# Patient Record
Sex: Female | Born: 1935
Health system: Southern US, Community
[De-identification: ages and names within clinical notes are randomized; demographics above are authoritative.]

## PROBLEM LIST (undated history)

## (undated) DIAGNOSIS — I10 Essential (primary) hypertension: Secondary | ICD-10-CM

## (undated) DIAGNOSIS — I4891 Unspecified atrial fibrillation: Secondary | ICD-10-CM

## (undated) DIAGNOSIS — Z780 Asymptomatic menopausal state: Secondary | ICD-10-CM

## (undated) DIAGNOSIS — E785 Hyperlipidemia, unspecified: Secondary | ICD-10-CM

## (undated) HISTORY — DX: Essential (primary) hypertension: I10

## (undated) HISTORY — DX: Asymptomatic menopausal state: Z78.0

## (undated) HISTORY — PX: EYE SURGERY: SHX253

## (undated) HISTORY — PX: ABDOMINAL HYSTERECTOMY: SHX81

## (undated) HISTORY — DX: Unspecified atrial fibrillation: I48.91

## (undated) HISTORY — DX: Hyperlipidemia, unspecified: E78.5

---

## 1973-11-28 HISTORY — PX: BREAST LUMPECTOMY: SHX2

## 1998-04-08 ENCOUNTER — Other Ambulatory Visit: Admission: RE | Admit: 1998-04-08 | Discharge: 1998-04-08 | Payer: Self-pay | Admitting: Obstetrics and Gynecology

## 1999-06-08 ENCOUNTER — Ambulatory Visit (HOSPITAL_COMMUNITY): Admission: RE | Admit: 1999-06-08 | Discharge: 1999-06-08 | Payer: Self-pay | Admitting: Gastroenterology

## 2001-05-10 ENCOUNTER — Other Ambulatory Visit: Admission: RE | Admit: 2001-05-10 | Discharge: 2001-05-10 | Payer: Self-pay | Admitting: Obstetrics and Gynecology

## 2001-05-10 ENCOUNTER — Encounter (INDEPENDENT_AMBULATORY_CARE_PROVIDER_SITE_OTHER): Payer: Self-pay | Admitting: Specialist

## 2003-10-28 ENCOUNTER — Other Ambulatory Visit: Admission: RE | Admit: 2003-10-28 | Discharge: 2003-10-28 | Payer: Self-pay | Admitting: Family Medicine

## 2004-10-13 ENCOUNTER — Ambulatory Visit: Payer: Self-pay | Admitting: Family Medicine

## 2005-02-01 ENCOUNTER — Encounter: Admission: RE | Admit: 2005-02-01 | Discharge: 2005-02-01 | Payer: Self-pay | Admitting: Family Medicine

## 2005-02-01 ENCOUNTER — Ambulatory Visit: Payer: Self-pay | Admitting: Family Medicine

## 2005-02-01 ENCOUNTER — Other Ambulatory Visit: Admission: RE | Admit: 2005-02-01 | Discharge: 2005-02-01 | Payer: Self-pay | Admitting: Family Medicine

## 2005-10-07 ENCOUNTER — Ambulatory Visit: Payer: Self-pay | Admitting: Family Medicine

## 2006-04-04 ENCOUNTER — Ambulatory Visit: Payer: Self-pay | Admitting: Family Medicine

## 2006-11-13 ENCOUNTER — Ambulatory Visit: Payer: Self-pay | Admitting: Family Medicine

## 2006-11-14 ENCOUNTER — Encounter: Admission: RE | Admit: 2006-11-14 | Discharge: 2006-11-14 | Payer: Self-pay | Admitting: Family Medicine

## 2007-04-05 ENCOUNTER — Ambulatory Visit: Payer: Self-pay | Admitting: Family Medicine

## 2007-04-25 ENCOUNTER — Ambulatory Visit: Payer: Self-pay | Admitting: Family Medicine

## 2007-04-25 LAB — CONVERTED CEMR LAB
ALT: 17 units/L (ref 0–40)
Albumin: 3.3 g/dL — ABNORMAL LOW (ref 3.5–5.2)
Bilirubin, Direct: 0.1 mg/dL (ref 0.0–0.3)
Calcium: 9.2 mg/dL (ref 8.4–10.5)
Potassium: 3.8 meq/L (ref 3.5–5.1)
Sodium: 138 meq/L (ref 135–145)
Total Bilirubin: 0.7 mg/dL (ref 0.3–1.2)
Total Protein: 7.4 g/dL (ref 6.0–8.3)

## 2007-04-27 DIAGNOSIS — M810 Age-related osteoporosis without current pathological fracture: Secondary | ICD-10-CM | POA: Insufficient documentation

## 2007-04-27 DIAGNOSIS — H409 Unspecified glaucoma: Secondary | ICD-10-CM | POA: Insufficient documentation

## 2007-04-27 DIAGNOSIS — I10 Essential (primary) hypertension: Secondary | ICD-10-CM | POA: Insufficient documentation

## 2007-05-03 ENCOUNTER — Encounter: Payer: Self-pay | Admitting: Family Medicine

## 2007-06-29 ENCOUNTER — Ambulatory Visit: Payer: Self-pay | Admitting: Family Medicine

## 2007-06-29 DIAGNOSIS — E8941 Symptomatic postprocedural ovarian failure: Secondary | ICD-10-CM | POA: Insufficient documentation

## 2007-06-29 DIAGNOSIS — Z9189 Other specified personal risk factors, not elsewhere classified: Secondary | ICD-10-CM | POA: Insufficient documentation

## 2007-06-29 DIAGNOSIS — E785 Hyperlipidemia, unspecified: Secondary | ICD-10-CM | POA: Insufficient documentation

## 2007-06-29 DIAGNOSIS — M79609 Pain in unspecified limb: Secondary | ICD-10-CM | POA: Insufficient documentation

## 2007-07-05 ENCOUNTER — Encounter: Payer: Self-pay | Admitting: Family Medicine

## 2007-07-16 ENCOUNTER — Encounter (INDEPENDENT_AMBULATORY_CARE_PROVIDER_SITE_OTHER): Payer: Self-pay | Admitting: *Deleted

## 2007-08-15 ENCOUNTER — Ambulatory Visit: Payer: Self-pay | Admitting: Family Medicine

## 2007-08-16 ENCOUNTER — Encounter (INDEPENDENT_AMBULATORY_CARE_PROVIDER_SITE_OTHER): Payer: Self-pay | Admitting: *Deleted

## 2007-09-10 ENCOUNTER — Telehealth (INDEPENDENT_AMBULATORY_CARE_PROVIDER_SITE_OTHER): Payer: Self-pay | Admitting: *Deleted

## 2007-10-10 ENCOUNTER — Ambulatory Visit: Payer: Self-pay | Admitting: Family Medicine

## 2007-11-09 ENCOUNTER — Telehealth (INDEPENDENT_AMBULATORY_CARE_PROVIDER_SITE_OTHER): Payer: Self-pay | Admitting: *Deleted

## 2008-01-14 ENCOUNTER — Telehealth (INDEPENDENT_AMBULATORY_CARE_PROVIDER_SITE_OTHER): Payer: Self-pay | Admitting: *Deleted

## 2008-05-20 ENCOUNTER — Telehealth (INDEPENDENT_AMBULATORY_CARE_PROVIDER_SITE_OTHER): Payer: Self-pay | Admitting: *Deleted

## 2008-07-08 ENCOUNTER — Encounter (INDEPENDENT_AMBULATORY_CARE_PROVIDER_SITE_OTHER): Payer: Self-pay | Admitting: *Deleted

## 2008-07-08 ENCOUNTER — Ambulatory Visit: Payer: Self-pay | Admitting: Family Medicine

## 2008-07-09 ENCOUNTER — Encounter (INDEPENDENT_AMBULATORY_CARE_PROVIDER_SITE_OTHER): Payer: Self-pay | Admitting: *Deleted

## 2008-07-22 ENCOUNTER — Encounter (INDEPENDENT_AMBULATORY_CARE_PROVIDER_SITE_OTHER): Payer: Self-pay | Admitting: *Deleted

## 2008-07-22 ENCOUNTER — Encounter: Payer: Self-pay | Admitting: Family Medicine

## 2008-08-05 ENCOUNTER — Telehealth (INDEPENDENT_AMBULATORY_CARE_PROVIDER_SITE_OTHER): Payer: Self-pay | Admitting: *Deleted

## 2008-08-20 ENCOUNTER — Ambulatory Visit: Payer: Self-pay | Admitting: Family Medicine

## 2009-02-09 ENCOUNTER — Telehealth (INDEPENDENT_AMBULATORY_CARE_PROVIDER_SITE_OTHER): Payer: Self-pay | Admitting: *Deleted

## 2009-03-25 ENCOUNTER — Ambulatory Visit: Payer: Self-pay | Admitting: Family Medicine

## 2009-03-25 LAB — CONVERTED CEMR LAB
ALT: 20 units/L (ref 0–35)
AST: 21 units/L (ref 0–37)
Albumin: 3.7 g/dL (ref 3.5–5.2)
Alkaline Phosphatase: 51 units/L (ref 39–117)
BUN: 17 mg/dL (ref 6–23)
Bilirubin, Direct: 0 mg/dL (ref 0.0–0.3)
CO2: 31 meq/L (ref 19–32)
Calcium: 9 mg/dL (ref 8.4–10.5)
Chloride: 104 meq/L (ref 96–112)
Cholesterol: 219 mg/dL — ABNORMAL HIGH (ref 0–200)
Creatinine, Ser: 0.7 mg/dL (ref 0.4–1.2)
Direct LDL: 113.3 mg/dL
GFR calc non Af Amer: 86.19 mL/min (ref >60)
Glucose, Bld: 83 mg/dL (ref 70–99)
HDL: 72.3 mg/dL (ref >39.00)
Potassium: 4.1 meq/L (ref 3.5–5.1)
Sodium: 142 meq/L (ref 135–145)
Total Bilirubin: 0.8 mg/dL (ref 0.3–1.2)
Total CHOL/HDL Ratio: 3
Total Protein: 8.1 g/dL (ref 6.0–8.3)
Triglycerides: 140 mg/dL (ref 0.0–149.0)
VLDL: 28 mg/dL (ref 0.0–40.0)

## 2009-03-26 ENCOUNTER — Encounter (INDEPENDENT_AMBULATORY_CARE_PROVIDER_SITE_OTHER): Payer: Self-pay | Admitting: *Deleted

## 2009-03-27 ENCOUNTER — Encounter (INDEPENDENT_AMBULATORY_CARE_PROVIDER_SITE_OTHER): Payer: Self-pay | Admitting: *Deleted

## 2009-04-02 ENCOUNTER — Telehealth (INDEPENDENT_AMBULATORY_CARE_PROVIDER_SITE_OTHER): Payer: Self-pay | Admitting: *Deleted

## 2009-04-03 ENCOUNTER — Ambulatory Visit: Payer: Self-pay | Admitting: Family Medicine

## 2009-04-03 DIAGNOSIS — B070 Plantar wart: Secondary | ICD-10-CM | POA: Insufficient documentation

## 2009-04-03 DIAGNOSIS — M25559 Pain in unspecified hip: Secondary | ICD-10-CM | POA: Insufficient documentation

## 2009-04-03 DIAGNOSIS — D239 Other benign neoplasm of skin, unspecified: Secondary | ICD-10-CM | POA: Insufficient documentation

## 2009-04-08 ENCOUNTER — Ambulatory Visit: Payer: Self-pay | Admitting: Family Medicine

## 2009-04-09 ENCOUNTER — Telehealth (INDEPENDENT_AMBULATORY_CARE_PROVIDER_SITE_OTHER): Payer: Self-pay | Admitting: *Deleted

## 2009-06-11 ENCOUNTER — Encounter: Payer: Self-pay | Admitting: Family Medicine

## 2009-09-24 ENCOUNTER — Ambulatory Visit: Payer: Self-pay | Admitting: Family Medicine

## 2010-02-15 ENCOUNTER — Encounter: Payer: Self-pay | Admitting: Family Medicine

## 2010-02-15 ENCOUNTER — Telehealth (INDEPENDENT_AMBULATORY_CARE_PROVIDER_SITE_OTHER): Payer: Self-pay | Admitting: *Deleted

## 2010-02-24 ENCOUNTER — Encounter: Payer: Self-pay | Admitting: Family Medicine

## 2010-04-15 ENCOUNTER — Encounter: Payer: Self-pay | Admitting: Family Medicine

## 2010-05-12 ENCOUNTER — Telehealth (INDEPENDENT_AMBULATORY_CARE_PROVIDER_SITE_OTHER): Payer: Self-pay | Admitting: *Deleted

## 2010-06-02 ENCOUNTER — Ambulatory Visit: Payer: Self-pay | Admitting: Family Medicine

## 2010-06-02 DIAGNOSIS — I8390 Asymptomatic varicose veins of unspecified lower extremity: Secondary | ICD-10-CM | POA: Insufficient documentation

## 2010-06-08 ENCOUNTER — Ambulatory Visit: Payer: Self-pay | Admitting: Vascular Surgery

## 2010-06-08 ENCOUNTER — Encounter: Payer: Self-pay | Admitting: Family Medicine

## 2010-06-08 ENCOUNTER — Ambulatory Visit: Admission: RE | Admit: 2010-06-08 | Discharge: 2010-06-08 | Payer: Self-pay | Admitting: Family Medicine

## 2010-06-28 ENCOUNTER — Telehealth: Payer: Self-pay | Admitting: Family Medicine

## 2010-06-29 ENCOUNTER — Ambulatory Visit: Payer: Self-pay | Admitting: Family Medicine

## 2010-06-29 DIAGNOSIS — R011 Cardiac murmur, unspecified: Secondary | ICD-10-CM | POA: Insufficient documentation

## 2010-06-30 ENCOUNTER — Encounter: Payer: Self-pay | Admitting: Family Medicine

## 2010-07-01 ENCOUNTER — Telehealth (INDEPENDENT_AMBULATORY_CARE_PROVIDER_SITE_OTHER): Payer: Self-pay | Admitting: *Deleted

## 2010-07-13 ENCOUNTER — Encounter (INDEPENDENT_AMBULATORY_CARE_PROVIDER_SITE_OTHER): Payer: Self-pay | Admitting: *Deleted

## 2010-07-13 ENCOUNTER — Ambulatory Visit: Payer: Self-pay | Admitting: Family Medicine

## 2010-07-14 LAB — CONVERTED CEMR LAB: Fecal Occult Bld: NEGATIVE

## 2010-07-15 ENCOUNTER — Encounter: Payer: Self-pay | Admitting: Family Medicine

## 2010-07-15 ENCOUNTER — Ambulatory Visit: Payer: Self-pay | Admitting: Internal Medicine

## 2010-07-15 ENCOUNTER — Ambulatory Visit: Payer: Self-pay

## 2010-07-15 ENCOUNTER — Ambulatory Visit (HOSPITAL_COMMUNITY): Admission: RE | Admit: 2010-07-15 | Discharge: 2010-07-15 | Payer: Self-pay | Admitting: Family Medicine

## 2010-07-19 ENCOUNTER — Ambulatory Visit: Payer: Self-pay | Admitting: Family Medicine

## 2010-08-25 ENCOUNTER — Ambulatory Visit: Payer: Self-pay | Admitting: Family Medicine

## 2010-09-13 ENCOUNTER — Telehealth: Payer: Self-pay | Admitting: Family Medicine

## 2010-11-15 ENCOUNTER — Encounter: Payer: Self-pay | Admitting: Family Medicine

## 2010-12-26 LAB — CONVERTED CEMR LAB
ALT: 16 units/L (ref 0–35)
AST: 22 units/L (ref 0–37)
Alkaline Phosphatase: 47 units/L (ref 39–117)
BUN: 14 mg/dL (ref 6–23)
Basophils Absolute: 0.1 10*3/uL (ref 0.0–0.1)
Basophils Absolute: 0.1 10*3/uL (ref 0.0–0.1)
Basophils Relative: 0.9 % (ref 0.0–3.0)
Basophils Relative: 0.9 % (ref 0.0–3.0)
Bilirubin Urine: NEGATIVE
Bilirubin, Direct: 0.1 mg/dL (ref 0.0–0.3)
Bilirubin, Direct: 0.2 mg/dL (ref 0.0–0.3)
Blood in Urine, dipstick: NEGATIVE
Blood in Urine, dipstick: NEGATIVE
CO2: 29 meq/L (ref 19–32)
Calcium: 9.4 mg/dL (ref 8.4–10.5)
Chloride: 102 meq/L (ref 96–112)
Cholesterol: 203 mg/dL — ABNORMAL HIGH (ref 0–200)
Creatinine, Ser: 0.7 mg/dL (ref 0.4–1.2)
Direct LDL: 106.2 mg/dL
Direct LDL: 110.1 mg/dL
Eosinophils Absolute: 0.1 10*3/uL (ref 0.0–0.7)
Eosinophils Absolute: 0.1 10*3/uL (ref 0.0–0.7)
Eosinophils Relative: 2.2 % (ref 0.0–5.0)
GFR calc Af Amer: 90 mL/min
GFR calc non Af Amer: 74 mL/min
Glucose, Bld: 83 mg/dL (ref 70–99)
Glucose, Urine, Semiquant: NEGATIVE
Glucose, Urine, Semiquant: NEGATIVE
HCT: 40.4 % (ref 36.0–46.0)
Ketones, urine, test strip: NEGATIVE
Ketones, urine, test strip: NEGATIVE
Lymphs Abs: 3.5 10*3/uL (ref 0.7–4.0)
MCV: 95.2 fL (ref 78.0–100.0)
Monocytes Absolute: 0.8 10*3/uL (ref 0.1–1.0)
Monocytes Relative: 12.5 % — ABNORMAL HIGH (ref 3.0–12.0)
Neutro Abs: 1.7 10*3/uL (ref 1.4–7.7)
Neutrophils Relative %: 27.6 % — ABNORMAL LOW (ref 43.0–77.0)
Neutrophils Relative %: 44.4 % (ref 43.0–77.0)
Nitrite: NEGATIVE
Nitrite: NEGATIVE
Potassium: 3.4 meq/L — ABNORMAL LOW (ref 3.5–5.1)
Potassium: 4.1 meq/L (ref 3.5–5.1)
Protein, U semiquant: NEGATIVE
Protein, U semiquant: NEGATIVE
RBC: 4.24 M/uL (ref 3.87–5.11)
Sodium: 138 meq/L (ref 135–145)
Sodium: 140 meq/L (ref 135–145)
Specific Gravity, Urine: 1.01
Total Bilirubin: 0.6 mg/dL (ref 0.3–1.2)
Total Bilirubin: 0.8 mg/dL (ref 0.3–1.2)
Total CHOL/HDL Ratio: 2.7
Total CHOL/HDL Ratio: 3
Triglycerides: 131 mg/dL (ref 0.0–149.0)
VLDL: 26 mg/dL (ref 0–40)
WBC: 5.9 10*3/uL (ref 4.5–10.5)

## 2010-12-28 NOTE — Assessment & Plan Note (Signed)
Summary: twisted ankle/cbs   Vital Signs:  Patient profile:   77 year old female Height:      62.25 inches Weight:      128 pounds BMI:     23.31 Temp:     98.2 degrees F oral Pulse rate:   80 / minute BP sitting:   130 / 78  (left arm)  Vitals Entered By: Jeremy Johann CMA (June 02, 2010 11:06 AM) CC: pain in left leg Comments REVIEWED MED LIST, PATIENT AGREED DOSE AND INSTRUCTION CORRECT    History of Present Illness:  Injury      This is a 77 year old woman who presents with An injury.  The symptoms began 2 weeks ago.  Pt thinks she injured her L leg while [place] the [place].  The patient reports injury to the left thigh and left leg.  The patient also reports swelling and tenderness.  The patient denies redness, increased warmth deformity, blood loss, numbness, weakness, loss of sensation, coolness of extremity, and loss of consciousness.  Screening for risk of abuse was negative.    Current Medications (verified): 1)  Fosamax 70 Mg  Tabs (Alendronate Sodium) .Marland Kitchen.. 1 By Mouth Weekly 2)  Premarin 0.45 Mg  Tabs (Estrogens Conjugated) .... Take One Tablet Daily. 3)  Toprol Xl 50 Mg  Xr24h-Tab (Metoprolol Succinate) .Marland Kitchen.. 1 By Mouth Once Daily 4)  Red Yeast Rice 600 Mg  Caps (Red Yeast Rice Extract) .Marland Kitchen.. 1 Qd 5)  Mvi .Marland Kitchen.. 1 Once Daily 6)  Adult Aspirin Low Strength 81 Mg  Tbdp (Aspirin) .... 2 By Mouth Once Daily 7)  Fish Oil   Oil (Fish Oil) 8)  Calcium 600/vitamin D 600-200 Mg-Unit  Tabs (Calcium Carbonate-Vitamin D) .Marland Kitchen.. 1 By Mouth Three Times A Day 9)  Xalatan 0.005 %  Soln (Latanoprost) 10)  Tylenol Ex St Arthritis Pain 500 Mg Tabs (Acetaminophen) .... As Needed  Allergies (verified): No Known Drug Allergies  Past History:  Past medical, surgical, family and social histories (including risk factors) reviewed for relevance to current acute and chronic problems.  Past Medical History: Reviewed history from 06/29/2007 and no changes  required. Hypertension Osteoporosis Postmenopausal-HRT Hyperlipidemia  Past Surgical History: Reviewed history from 07/08/2008 and no changes required. Hysterectomy Lumpectomy (1975)  Family History: Reviewed history from 04/27/2007 and no changes required. Family History MI Family History Glaucoma  Social History: Reviewed history from 07/08/2008 and no changes required. 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   pt walking with crutches Skin:  Intact without suspicious lesions or rashes Psych:  Oriented X3 and normally interactive.     Impression & Recommendations:  Problem # 1:  LEG PAIN, LEFT (ICD-729.5)  Orders: T-Femur Left 2 views (73550TC) T-Knee Left 2 view (73560TC) Doppler Referral (Doppler)  Problem # 2:  VARICOSE VEINS, LOWER EXTREMITIES (ICD-454.9)  Orders: Doppler Referral (Doppler)  Complete Medication List: 1)  Fosamax 70 Mg Tabs (Alendronate sodium) .Marland Kitchen.. 1 by mouth weekly 2)  Premarin 0.45 Mg Tabs (Estrogens conjugated) .... Take one tablet daily. 3)  Toprol Xl 50 Mg Xr24h-tab (Metoprolol succinate) .Marland Kitchen.. 1 by mouth once daily 4)  Red Yeast Rice 600 Mg Caps (Red yeast rice extract) .Marland Kitchen.. 1 qd 5)  Mvi  .Marland Kitchen.. 1 once daily 6)  Adult Aspirin Low Strength 81 Mg Tbdp (Aspirin) .... 2 by mouth once daily 7)  Fish Oil Oil (Fish oil) 8)  Calcium 600/vitamin D 600-200 Mg-unit Tabs (Calcium carbonate-vitamin d) .Marland Kitchen.. 1 by mouth three times a day 9)  Xalatan 0.005 % Soln (Latanoprost) 10)  Tylenol Ex St Arthritis Pain 500 Mg Tabs (Acetaminophen) .... As needed  Patient  Instructions: 1)  rto as needed

## 2010-12-28 NOTE — Progress Notes (Signed)
Summary: Fosamax concerns  Phone Note Call from Patient   Caller: Patient Call For: Loreen Freud DO Details for Reason: Fosamax Summary of Call: Rcv'd mssg from pt requesting refill of Fosamax, said she has been on it for 5 years and wanted to know if it was ok to continue to take it or if it was time to change to something else. c/b # [id number]. Please advise Initial call taken by: Almeta Monas CMA Duncan Dull),  September 13, 2010 2:44 PM  Follow-up for Phone Call        time to change to something else---  looks like she is due for BMD--last one 06/2008?---- we will check BMD and then discuss options---con't fosamax until bmd done Follow-up by: Loreen Freud DO,  September 13, 2010 3:06 PM  Additional Follow-up for Phone Call Additional follow up Details #1::        FYI REFERENCE BMD...Marland KitchenMarland KitchenI SCHEDULED PATIENT FOR BMD 09-17-2010.  WHEN I CALLED TO INFORM HER SHE STATES SHE HAS ALREADY SPOKEN WITH [facility] & INFORMED THEM SHE WILL CALL THEM TO SCH'D HER MMG & BMD FOR MAY-2012, I CXLED ABOVE APPT & INFORMED DR. Sunnie Nielsen [facility]  September 14, 2010 3:09 PM      Appended Document: Fosamax concerns left detailed mssg to advised pt to continue RX and call with any questions or concerns.

## 2010-12-28 NOTE — Assessment & Plan Note (Signed)
Summary: flu shot//lch  Nurse Visit   Allergies: No Known Drug Allergies  Orders Added: 1)  Flu Vaccine 46yrs + MEDICARE PATIENTS [Q2039] 2)  Administration Flu vaccine - MCR [G0008] Flu Vaccine Consent Questions     Do you have a history of severe allergic reactions to this vaccine? no    Any prior history of allergic reactions to egg and/or gelatin? no    Do you have a sensitivity to the preservative Thimersol? no    Do you have a past history of Guillan-Barre Syndrome? no    Do you currently have an acute febrile illness? no    Have you ever had a severe reaction to latex? no    Vaccine information given and explained to patient? yes    Are you currently pregnant? no    Lot Number:[id number]   Exp Date:05/28/2011   Manufacturer: [employer]    Site Given Right Deltoid IM

## 2010-12-28 NOTE — Progress Notes (Signed)
Summary: Fasting  Phone Note Call from Patient Call back at Home Phone [phone]   Summary of Call: Patient left message asking if she should fast for CPX/labs tomorrow. Per Md--- since it is an afternoon appt she does not have to but if she can we will do labs at that time to save the patient a trip back to the office.   Left message on machine notifying patient. Initial call taken by: Lucious Groves CMA,  June 28, 2010 3:03 PM

## 2010-12-28 NOTE — Medication Information (Signed)
Summary: Letter Regarding Premarin/[facility]  Letter Regarding Premarin/[facility]   Imported By: Lanelle Bal 04/06/2010 12:43:32  _____________________________________________________________________  External Attachment:    Type:   Image     Comment:   External Document

## 2010-12-28 NOTE — Progress Notes (Signed)
Summary: Why Echo was Cancelled  Phone Note Outgoing Call Call back at Home Phone [phone]   Call placed by: Shonna Chock CMA,  July 01, 2010 4:35 PM Call placed to: Patient Summary of Call: why was appointment cancelled?  Signed by Loreen Freud DO on 06/30/2010 at 8:41 PM   Follow-up for Phone Call        I spoke with patient and she indicated that she cancled Echo appointment cause she could not make it that day but she did reschedule to  07/15/10  Follow-up by: Shonna Chock CMA,  July 01, 2010 4:37 PM

## 2010-12-28 NOTE — Consult Note (Signed)
Summary: Duard Larsen MD Dermatology  Duard Larsen MD Dermatology   Imported By: Lanelle Bal 04/13/2010 13:30:28  _____________________________________________________________________  External Attachment:    Type:   Image     Comment:   External Document

## 2010-12-28 NOTE — Miscellaneous (Signed)
Summary: Appointment Canceled  Appointment status changed to canceled by [employer] on 06/30/2010 4:41 PM.  Cancellation Comments --------------------- echo./[email or web address]/jml  Appointment Information ----------------------- Appt Type:  CARDIOLOGY ANCILLARY VISIT      Date:  Monday, July 12, 2010      Time:  3:00 PM for 60 min   Urgency:  Routine   Made By:  Pearson Grippe  To Visit:  [id number]    Reason:  echo./[email or web address]/jml  Appt Comments ------------- -- 06/30/10 16:41: (CEMR) CANCELED -- echo./murmur/jml -- 06/29/10 15:10: (CEMR) BOOKED -- Routine CARDIOLOGY ANCILLARY VISIT at 07/12/2010 3:00 PM for 60 min echo./murmur/jml

## 2010-12-28 NOTE — Assessment & Plan Note (Signed)
Summary: bp check//lch   Vital Signs:  Patient profile:   77 year old female BP sitting:   130 / 76  Vitals Entered By: Almeta Monas CMA Duncan Dull) (July 19, 2010 4:23 PM) CC: bp check only per pt   Allergies: No Known Drug Allergies   Complete Medication List: 1)  Fosamax 70 Mg Tabs (Alendronate sodium) .Marland Kitchen.. 1 by mouth weekly 2)  Premarin 0.45 Mg Tabs (Estrogens conjugated) .... Take one tablet daily. 3)  Toprol Xl 50 Mg Xr24h-tab (Metoprolol succinate) .Marland Kitchen.. 1 by mouth once daily 4)  Red Yeast Rice 600 Mg Caps (Red yeast rice extract) .Marland Kitchen.. 1 qd 5)  Mvi  .Marland Kitchen.. 1 once daily 6)  Adult Aspirin Low Strength 81 Mg Tbdp (Aspirin) .... 2 by mouth once daily 7)  Fish Oil Oil (Fish oil) 8)  Calcium 600/vitamin D 600-200 Mg-unit Tabs (Calcium carbonate-vitamin d) .Marland Kitchen.. 1 by mouth three times a day 9)  Xalatan 0.005 % Soln (Latanoprost) 10)  Tylenol Ex St Arthritis Pain 500 Mg Tabs (Acetaminophen) .... As needed 11)  Vitamin D3 2000 Unit Caps (Cholecalciferol) .Marland Kitchen.. 1 by mouth once daily

## 2010-12-28 NOTE — Miscellaneous (Signed)
Summary: Appointment Canceled  Appointment status changed to canceled by [employer] on 06/30/2010 4:42 PM.  Cancellation Comments --------------------- echo./murmur/jml  Appointment Information ----------------------- Appt Type:  CARDIOLOGY ANCILLARY VISIT      Date:  Wednesday, July 14, 2010      Time:  4:00 PM for 60 min   Urgency:  Routine   Made By:  Pearson Grippe  To Visit:  [id number]    Reason:  echo./murmur/jml  Appt Comments ------------- -- 06/30/10 16:42: (CEMR) CANCELED -- echo./murmur/jml -- 06/30/10 16:41: (CEMR) BOOKED -- Routine CARDIOLOGY ANCILLARY VISIT at 07/14/2010 4:00 PM for 60 min echo./murmur/jml  Appended Document: Appointment Canceled why was appointment cancelled?

## 2010-12-28 NOTE — Letter (Signed)
Summary: [facility]: Immunoassay Fecal Occult Blood (iFOB) Order Form  [facility] [place]  [address] [place], [place] [address]   Phone: [phone]  Fax: [phone]       Lab: Immunoassay Fecal Occult Blood (iFOB) Order Form   July 13, 2010 MRN: [id number]   Evelyn [name] 20-Aug-1930   Physicican Name:_______DR LOWNE__________________  Diagnosis Code:_____V76.51_____________________      Jeremy Johann CMA

## 2010-12-28 NOTE — Progress Notes (Signed)
Summary: referral  Phone Note Call from Patient Call back at Home Phone [phone]   Caller: Patient Summary of Call: pt left VM that she would like a referral to dermatologist to evaluated some spots on her face that she feel may be cancerous..............Marland KitchenFelecia Deloach CMA  February 15, 2010 5:22 PM   left pt detail message referral put in awaiting call back with appt info................Marland KitchenFelecia Deloach CMA  February 15, 2010 5:23 PM

## 2010-12-28 NOTE — Assessment & Plan Note (Signed)
Summary: CPX/KN   Vital Signs:  Patient profile:   77 year old female Height:      62.25 inches (158.12 cm) Weight:      126 pounds (57.27 kg) BMI:     22.94 Temp:     98.2 degrees F (36.78 degrees C) oral BP sitting:   160 / 74  (right arm) Cuff size:   regular  Vitals Entered By: Lucious Groves CMA (June 29, 2010 1:15 PM) CC: CPX./kb Is Patient Diabetic? No Pain Assessment Patient in pain? no       Does patient need assistance? Functional Status Self care, Cook/clean, Shopping, Social activities Ambulation Normal Comments Patient notes that she did not take her BP med due to fasting for labs today./kb Pt able to read and write and able to perform all ADLs.    Vision Screening:      Vision Comments: corrected to 20/20 with contact 40db HL: Left  Right  Audiometry Comment: hearing grossly normal     History of Present Illness: Pt here for cpe and labs.    Pt still c/o L knee pain---xrays --+ arthritic changes only  Hyperlipidemia follow-up      This is a 77 year old woman who presents for Hyperlipidemia follow-up.  The patient denies muscle aches, GI upset, abdominal pain, flushing, itching, constipation, diarrhea, and fatigue.  The patient denies the following symptoms: chest pain/pressure, exercise intolerance, dypsnea, palpitations, syncope, and pedal edema.  Compliance with medications (by patient report) has been near 100%.  Dietary compliance has been good.  The patient reports no exercise.    Hypertension follow-up      The patient also presents for Hypertension follow-up.  The patient denies lightheadedness, urinary frequency, headaches, edema, impotence, rash, and fatigue.  The patient denies the following associated symptoms: chest pain, chest pressure, exercise intolerance, dyspnea, palpitations, syncope, leg edema, and pedal edema.  Compliance with medications (by patient report) has been near 100%.  The patient reports that dietary compliance has been good.   The patient reports no exercise.  Adjunctive measures currently used by the patient include salt restriction.    Preventive Screening-Counseling & Management  Alcohol-Tobacco     Alcohol drinks/day: <1     Smoking Status: quit > 6 months     Packs/Day: 1.0     Year Started: 1947     Year Quit: 1975  Caffeine-Diet-Exercise     Caffeine use/day: 3     Does Patient Exercise: no     Times/week: 0  Hep-HIV-STD-Contraception     HIV Risk: no     Dental Visit-last 6 months yes     Dental Care Counseling: not indicated; dental care within six months     SBE monthly: yes     SBE Education/Counseling: not indicated; SBE done regularly  Safety-Violence-Falls     Seat Belt Use: 100      Sexual History:  widow.    Current Medications (verified): 1)  Fosamax 70 Mg  Tabs (Alendronate Sodium) .Marland Kitchen.. 1 By Mouth Weekly 2)  Premarin 0.45 Mg  Tabs (Estrogens Conjugated) .... Take One Tablet Daily. 3)  Toprol Xl 50 Mg  Xr24h-Tab (Metoprolol Succinate) .Marland Kitchen.. 1 By Mouth Once Daily 4)  Red Yeast Rice 600 Mg  Caps (Red Yeast Rice Extract) .Marland Kitchen.. 1 Qd 5)  Mvi .Marland Kitchen.. 1 Once Daily 6)  Adult Aspirin Low Strength 81 Mg  Tbdp (Aspirin) .... 2 By Mouth Once Daily 7)  Fish Oil   Oil (Fish  Oil) 8)  Calcium 600/vitamin D 600-200 Mg-Unit  Tabs (Calcium Carbonate-Vitamin D) .Marland Kitchen.. 1 By Mouth Three Times A Day 9)  Xalatan 0.005 %  Soln (Latanoprost) 10)  Tylenol Ex St Arthritis Pain 500 Mg Tabs (Acetaminophen) .... 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 Family History MI Family History Glaucoma Family History Lung cancer  Social History: Reviewed history from 07/08/2008 and no changes required. 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 Eyes:  Denies blurring, discharge, double vision, eye irritation, eye pain, halos, itching, light sensitivity, red eye, vision loss-1 eye, and vision loss-both eyes. ENT:  Denies decreased hearing, difficulty swallowing, ear discharge, earache, hoarseness, nasal congestion, nosebleeds, postnasal drainage, ringing in ears, sinus pressure, and sore throat. CV:  Denies bluish discoloration of lips or nails, chest pain or discomfort, difficulty breathing at night, difficulty breathing while lying down, fainting, fatigue, leg cramps with exertion, lightheadness, near fainting, palpitations, shortness of breath with exertion, swelling of feet, swelling of hands, and weight gain. Resp:  Denies chest discomfort, chest pain with inspiration, cough, coughing up blood, excessive snoring, hypersomnolence, morning headaches, pleuritic, shortness of breath, sputum productive, and wheezing. GI:  Denies abdominal pain, bloody stools, change in bowel habits, constipation, dark tarry stools, diarrhea, excessive appetite,  gas, hemorrhoids, indigestion, loss of appetite, and nausea. GU:  Denies abnormal vaginal bleeding, decreased libido, discharge, dysuria, genital sores, hematuria, incontinence, nocturia, urinary frequency, and urinary hesitancy. MS:  Complains of joint pain; denies joint redness, joint swelling, loss of strength, low back pain, mid back pain, muscle aches, muscle , cramps, muscle weakness, stiffness, and thoracic pain. Derm:  Denies changes in color of skin, changes in nail beds, dryness, excessive perspiration, flushing, hair loss, insect bite(s), itching, lesion(s), poor wound healing, and rash. Neuro:  Denies brief paralysis, difficulty with concentration, disturbances in coordination, falling down, headaches, inability to speak, memory loss, numbness, poor balance, seizures, sensation of room spinning, tingling, tremors, visual disturbances, and weakness. Psych:  Denies alternate hallucination ( auditory/visual), anxiety, depression, easily angered, easily tearful, irritability, mental problems, panic attacks, sense of great danger, suicidal thoughts/plans, thoughts of violence, unusual visions or sounds, and thoughts /plans of harming others. Endo:  Denies cold intolerance, excessive hunger, excessive thirst, excessive urination, heat intolerance, polyuria, and weight change. Heme:  Denies abnormal bruising, bleeding, enlarge lymph nodes, fevers, pallor, and skin discoloration. Allergy:  Denies hives or rash, itching eyes, persistent infections, seasonal allergies, and sneezing.  Physical Exam  General:  Well-developed,well-nourished,in no acute distress; alert,appropriate and cooperative throughout examination Head:  Normocephalic and atraumatic without obvious abnormalities. No apparent alopecia or balding. Eyes:  pupils equal, pupils round, pupils reactive to light, and no injection.   Ears:  External ear exam shows no significant lesions or deformities.  Otoscopic examination reveals clear  canals, tympanic membranes are intact bilaterally without bulging, retraction, inflammation or discharge. Hearing is grossly normal bilaterally. Nose:  External nasal examination shows no deformity or inflammation. Nasal mucosa are pink and moist without lesions or exudates. Mouth:  Oral mucosa and oropharynx without lesions or exudates.  Teeth in good repair. Neck:  No deformities, masses, or tenderness noted.no carotid bruits.   Chest Wall:  No deformities, masses, or tenderness noted. Breasts:  No mass, nodules, thickening, tenderness, bulging, retraction, inflamation, nipple discharge or skin changes noted.   Lungs:  Normal respiratory effort, chest expands symmetrically. Lungs are clear to auscultation, no crackles or wheezes. Heart:  normal rate and Grade 2  /6 systolic ejection murmur.   Abdomen:  Bowel sounds positive,abdomen soft and non-tender without masses, organomegaly or hernias noted. Msk:  normal ROM.   L knee-+ swelling , no pain with palpation Pulses:  R posterior tibial normal, R dorsalis pedis normal, R carotid normal, L posterior tibial normal, L dorsalis pedis normal, and L carotid normal.   Extremities:  No clubbing, cyanosis, edema, or deformity noted with normal full range of motion of all joints.   Neurologic:  alert & oriented X3, cranial nerves II-XII intact, strength normal in all extremities, and gait normal.   Skin:  Intact without suspicious lesions or rashes Cervical Nodes:  No lymphadenopathy noted Axillary Nodes:  No palpable lymphadenopathy Psych:  Cognition and judgment appear intact. Alert and cooperative with normal attention span and concentration. No apparent delusions, illusions, hallucinations   Impression & Recommendations:  Problem # 1:  PREVENTIVE HEALTH CARE (ICD-V70.0)  Orders: Venipuncture (57846) TLB-Lipid Panel (80061-LIPID) TLB-BMP (Basic Metabolic Panel-BMET) (80048-METABOL) TLB-CBC Platelet - w/Differential  (85025-CBCD) TLB-Hepatic/Liver Function Pnl (80076-HEPATIC) T-Vitamin D (25-Hydroxy) (96295-28413) Specimen Handling (24401) UA Dipstick w/o Micro (manual) (81002) EKG w/ Interpretation (93000) First annual wellness visit with prevention plan  (U2725)  Problem # 2:  CARDIAC MURMUR (ICD-785.2) Assessment: New  Orders: Echo Referral (Echo) EKG w/ Interpretation (93000) Prescription Created Electronically [phone])  Problem # 3:  HYPERLIPIDEMIA (ICD-272.4)  Orders: Venipuncture (03474) TLB-Lipid Panel (80061-LIPID) TLB-BMP (Basic Metabolic Panel-BMET) (80048-METABOL) TLB-CBC Platelet - w/Differential (85025-CBCD) TLB-Hepatic/Liver Function Pnl (80076-HEPATIC) T-Vitamin D (25-Hydroxy) (25956-38756) Specimen Handling (43329) EKG w/ Interpretation (93000) Prescription Created Electronically [id number])  Labs Reviewed: SGOT: 21 (03/25/2009)   SGPT: 20 (03/25/2009)  Prior 10 Yr Risk Heart Disease: Not enough information (06/29/2007)   HDL:72.30 (03/25/2009), 76.9 (07/08/2008)  LDL:DEL (07/08/2008)  Chol:219 (03/25/2009), 209 (07/08/2008)  Trig:140.0 (03/25/2009), 128 (07/08/2008)  Problem # 4:  OSTEOPOROSIS (ICD-733.00)  Her updated medication list for this problem includes:    Fosamax 70 Mg Tabs (Alendronate sodium) .Marland Kitchen... 1 by mouth weekly    Calcium 600/vitamin D 600-200 Mg-unit Tabs (Calcium carbonate-vitamin d) .Marland Kitchen... 1 by mouth three times a day  Orders: Venipuncture (16606) TLB-Lipid Panel (80061-LIPID) TLB-BMP (Basic Metabolic Panel-BMET) (80048-METABOL) TLB-CBC Platelet - w/Differential (85025-CBCD) TLB-Hepatic/Liver Function Pnl (80076-HEPATIC) T-Vitamin D (25-Hydroxy) (30160-10932) Specimen Handling (35573) EKG w/ Interpretation (93000) Prescription Created Electronically [id number])  Bone Density: abnormal (07/09/2008) Vit D:54 (03/25/2009), 60 (07/08/2008)  Problem # 5:  HYPERTENSION (ICD-401.9)  Her updated medication list for this problem includes:    Toprol Xl 50  Mg Xr24h-tab (Metoprolol succinate) .Marland Kitchen... 1 by mouth once daily  Orders: Venipuncture (16109) TLB-Lipid Panel (80061-LIPID) TLB-BMP (Basic Metabolic Panel-BMET) (80048-METABOL) TLB-CBC Platelet - w/Differential (85025-CBCD) TLB-Hepatic/Liver Function Pnl (80076-HEPATIC) T-Vitamin D (25-Hydroxy) (60454-09811) Echo Referral (Echo) EKG w/ Interpretation (93000) Prescription Created Electronically [id number])  BP today: 160/74 Prior BP: 130/78 (06/02/2010)  Prior 10 Yr Risk Heart Disease: Not enough information (06/29/2007)  Labs Reviewed: K+: 4.1 (03/25/2009) Creat: : 0.7 (03/25/2009)   Chol: 219 (03/25/2009)   HDL: 72.30 (03/25/2009)   LDL: DEL (07/08/2008)   TG: 140.0 (03/25/2009)  Complete Medication List: 1)  Fosamax 70 Mg Tabs (Alendronate sodium) .Marland Kitchen.. 1 by mouth weekly 2)  Premarin 0.45 Mg Tabs (Estrogens conjugated) .... Take one tablet daily. 3)  Toprol Xl 50 Mg Xr24h-tab (Metoprolol succinate) .Marland Kitchen.. 1 by mouth once daily 4)  Red Yeast Rice 600 Mg Caps (Red yeast rice extract) .Marland Kitchen.. 1 qd 5)  Mvi  .Marland Kitchen.. 1 once daily 6)  Adult Aspirin Low Strength 81 Mg Tbdp (Aspirin) .... 2 by mouth once daily 7)  Fish Oil Oil (Fish oil) 8)  Calcium 600/vitamin D 600-200 Mg-unit Tabs (Calcium carbonate-vitamin d) .Marland Kitchen.. 1 by mouth three times a day 9)  Xalatan 0.005 % Soln (Latanoprost) 10)  Tylenol Ex St Arthritis Pain 500 Mg Tabs (Acetaminophen) .... As needed Prescriptions: PREMARIN 0.45 MG  TABS (ESTROGENS CONJUGATED) Take one tablet daily.  #30 x 11   Entered and Authorized by:   Loreen Freud DO   Signed by:   Loreen Freud DO on 06/29/2010   Method used:   Electronically to        [facility] [phone]* (retail)       [address]       [place], [place]  [address]       Ph: [phone]       Fax: [phone]   RxID:   [id number]    PAP Next Due:  Refused Last Mammogram:  normal bilateral (07/08/2007 6:22:00 PM) Mammogram Result Date:   04/07/2010 Mammogram Result:  normal Mammogram Next Due:  1 yr Bone Density Result Date:  07/09/2008 Bone Density Result:  abnormal Bone Density Next Due: 2 yr       Laboratory Results   Urine Tests   Date/Time Reported: June 29, 2010 2:12 PM   Routine Urinalysis   Color: yellow Appearance: Clear Glucose: negative   (Normal Range: Negative) Bilirubin: negative   (Normal Range: Negative) Ketone: negative   (Normal Range: Negative) Spec. Gravity: 1.010   (Normal Range: 1.003-1.035) Blood: negative   (Normal Range: Negative) pH: 7.5   (Normal Range: 5.0-8.0) Protein: negative   (Normal Range: Negative) Urobilinogen: negative   (Normal Range: 0-1) Nitrite: negative   (Normal Range: Negative) Leukocyte Esterace: negative   (Normal Range: Negative)    Comments: Floydene Flock  June 29, 2010 2:12 PM

## 2010-12-28 NOTE — Progress Notes (Signed)
Summary: pt needs ov lm & mailed letter  Phone Note Outgoing Call   Call placed by: Army Fossa CMA,  May 12, 2010 1:09 PM Reason for Call: Confirm/change Appt Summary of Call: Pt needs to schedule an OV. Army Fossa CMA  May 12, 2010 1:09 PM   Follow-up for Phone Call        lm am to schedule appt .Marland KitchenOkey Regal [place]  May 13, 2010 12:11 PM  mailed leterr .Marland KitchenOkey Regal [place]  May 14, 2010 3:33 PM

## 2011-01-13 NOTE — Medication Information (Signed)
Summary: Letter Regarding Premarin/[facility]  Letter Regarding Premarin/[facility]   Imported By: Lanelle Bal 01/05/2011 08:25:05  _____________________________________________________________________  External Attachment:    Type:   Image     Comment:   External Document

## 2011-05-17 ENCOUNTER — Other Ambulatory Visit: Payer: Self-pay | Admitting: *Deleted

## 2011-05-17 MED ORDER — METOPROLOL SUCCINATE ER 50 MG PO TB24: 50.0000 mg | ORAL_TABLET | Freq: Every day | ORAL | Status: DC

## 2011-05-17 NOTE — Telephone Encounter (Signed)
Rx faxed.    KP 

## 2011-06-06 ENCOUNTER — Encounter: Payer: Self-pay | Admitting: Family Medicine

## 2011-06-09 ENCOUNTER — Encounter: Payer: Self-pay | Admitting: Family Medicine

## 2011-06-20 ENCOUNTER — Telehealth: Payer: Self-pay

## 2011-06-20 NOTE — Telephone Encounter (Signed)
Mssg from patient stating she has a pain in her neck for the last few days. No other details left.     I tried calling patient.   Lmom    KP

## 2011-06-21 ENCOUNTER — Encounter: Payer: Self-pay | Admitting: Family Medicine

## 2011-06-21 ENCOUNTER — Ambulatory Visit (INDEPENDENT_AMBULATORY_CARE_PROVIDER_SITE_OTHER): Payer: Medicare Other | Admitting: Family Medicine

## 2011-06-21 VITALS — BP 142/78 | HR 60 | Temp 99.2°F | Wt 128.6 lb

## 2011-06-21 DIAGNOSIS — M542 Cervicalgia: Secondary | ICD-10-CM

## 2011-06-21 DIAGNOSIS — N951 Menopausal and female climacteric states: Secondary | ICD-10-CM

## 2011-06-21 DIAGNOSIS — Z780 Asymptomatic menopausal state: Secondary | ICD-10-CM

## 2011-06-21 MED ORDER — TRAMADOL HCL 50 MG PO TABS: 50.0000 mg | ORAL_TABLET | Freq: Four times a day (QID) | ORAL | Status: AC | PRN

## 2011-06-21 MED ORDER — ESTROGENS CONJ SYNTHETIC A 0.3 MG PO TABS: 0.3000 mg | ORAL_TABLET | Freq: Every day | ORAL | Status: DC

## 2011-06-21 NOTE — Progress Notes (Signed)
  Subjective:     Evelyn [name] is a 77 y.o. female who presents for evaluation of neck pain. Event that precipitated these symptoms: none known. Onset of symptoms was 6 weeks ago, and have been unchanged since that time. Current symptoms are pain in R side neck to scalp (dull in character; 2/10 in severity). Patient denies numbness in arms and weakness in arms. Patient has had no prior neck problems and hx OA and Osteoporosis and sinus headaches.. Previous treatments: medication: analgesic: sudafed, tylenol.  The following portions of the patient's history were reviewed and updated as appropriate: allergies, current medications, past family history, past medical history, past social history, past surgical history and problem list.  Review of Systems Pertinent items are noted in HPI.    Objective:    BP 142/78  Pulse 60  Temp(Src) 99.2 F (37.3 C) (Oral)  Wt 128 lb 9.6 oz (58.333 kg)  SpO2 95% General:   alert, cooperative, appears stated age and no distress  External Deformity:  absent  ROM Cervical Spine:  supple and left rotation to 45 degrees  Midline Tenderness:  absent midline  Paraspinous tenderness:  + tenderness OA / AA on Right  UE Neurologic Exam HEENT:  Unremarkable  No sinus tenderness,  turb errythematous    X-ray of the cervical spine: Not indicated    Assessment:    Cervical pain   sinus pressure Plan:    ultram for pain Warm compresses otc antihistamine,  nasonex If no better we will get xray neck

## 2011-06-21 NOTE — Telephone Encounter (Signed)
Late entry: I spoke with patient and scheduled her an appt with Dr.Lowne because she had the pain for over a month and was not sure if it was sinuses or headache related. She was seen today

## 2011-06-22 ENCOUNTER — Telehealth: Payer: Self-pay | Admitting: *Deleted

## 2011-06-22 NOTE — Telephone Encounter (Signed)
PA complete and faxed back awaiting response.

## 2011-06-22 NOTE — Telephone Encounter (Signed)
Call received from [facility] that med was denied due to Pt not trying any of formulary alternative.

## 2011-06-22 NOTE — Telephone Encounter (Signed)
Preferred med: Gerlene Burdock, Estradiol, Jolivette, Orth-Est or prgestrone capsule. Pt must have tried and fail one of the following preferred med in order to receive coverage of a non-preferred med.

## 2011-06-22 NOTE — Telephone Encounter (Signed)
Those are all BCP---pt is postmenopausal---she needs HRT not bcp

## 2011-06-22 NOTE — Telephone Encounter (Signed)
de

## 2011-06-29 ENCOUNTER — Telehealth: Payer: Self-pay

## 2011-06-29 NOTE — Telephone Encounter (Signed)
Discussed benefits with patient and advised it will be a 15 dollar copay with OV and 165 is the 20 percent she will be responsible for. She voiced understanding and agreed to the injection, she stated she would call and schedule her appt in 2 weeks.     KP

## 2011-06-29 NOTE — Telephone Encounter (Signed)
Error.     KP 

## 2011-06-30 MED ORDER — ESTRADIOL 0.5 MG PO TABS: 0.5000 mg | ORAL_TABLET | Freq: Every day | ORAL | Status: DC

## 2011-06-30 NOTE — Telephone Encounter (Signed)
Discuss with patient by kim.

## 2011-06-30 NOTE — Telephone Encounter (Signed)
Estradiol 0.5 mg  #30  1 po qd ,   11 refills

## 2011-06-30 NOTE — Telephone Encounter (Signed)
Left message to call office, Rx sent to pharmacy, med changed.

## 2011-07-12 ENCOUNTER — Other Ambulatory Visit: Payer: Self-pay | Admitting: Family Medicine

## 2011-07-14 ENCOUNTER — Ambulatory Visit (INDEPENDENT_AMBULATORY_CARE_PROVIDER_SITE_OTHER): Payer: Medicare Other | Admitting: Family Medicine

## 2011-07-14 ENCOUNTER — Ambulatory Visit (HOSPITAL_BASED_OUTPATIENT_CLINIC_OR_DEPARTMENT_OTHER)
Admission: RE | Admit: 2011-07-14 | Discharge: 2011-07-14 | Disposition: A | Discharge status: Home / Self Care | Payer: Medicare Other | Source: Ambulatory Visit | Attending: Family Medicine | Admitting: Family Medicine

## 2011-07-14 ENCOUNTER — Encounter: Payer: Self-pay | Admitting: Family Medicine

## 2011-07-14 VITALS — BP 110/76 | HR 50 | Temp 98.7°F | Resp 16 | Wt 129.0 lb

## 2011-07-14 DIAGNOSIS — R51 Headache: Secondary | ICD-10-CM | POA: Insufficient documentation

## 2011-07-14 DIAGNOSIS — M5020 Other cervical disc displacement, unspecified cervical region: Secondary | ICD-10-CM | POA: Insufficient documentation

## 2011-07-14 DIAGNOSIS — I10 Essential (primary) hypertension: Secondary | ICD-10-CM

## 2011-07-14 DIAGNOSIS — D229 Melanocytic nevi, unspecified: Secondary | ICD-10-CM

## 2011-07-14 DIAGNOSIS — M542 Cervicalgia: Secondary | ICD-10-CM

## 2011-07-14 DIAGNOSIS — E785 Hyperlipidemia, unspecified: Secondary | ICD-10-CM

## 2011-07-14 DIAGNOSIS — M5030 Other cervical disc degeneration, unspecified cervical region: Secondary | ICD-10-CM

## 2011-07-14 DIAGNOSIS — D239 Other benign neoplasm of skin, unspecified: Secondary | ICD-10-CM

## 2011-07-14 DIAGNOSIS — Z0000 Encounter for general adult medical examination without abnormal findings: Secondary | ICD-10-CM

## 2011-07-14 LAB — BASIC METABOLIC PANEL
BUN: 16 mg/dL (ref 6–23)
CO2: 28 mEq/L (ref 19–32)
Glucose, Bld: 83 mg/dL (ref 70–99)

## 2011-07-14 LAB — POCT URINALYSIS DIPSTICK
Bilirubin, UA: NEGATIVE
Glucose, UA: NEGATIVE
Ketones, UA: NEGATIVE
Leukocytes, UA: NEGATIVE
Nitrite, UA: NEGATIVE

## 2011-07-14 LAB — CBC WITH DIFFERENTIAL/PLATELET
Basophils Relative: 0.6 % (ref 0.0–3.0)
Eosinophils Absolute: 0.1 10*3/uL (ref 0.0–0.7)
Eosinophils Relative: 1.8 % (ref 0.0–5.0)
Hemoglobin: 13.3 g/dL (ref 12.0–15.0)
Lymphocytes Relative: 39.3 % (ref 12.0–46.0)
MCHC: 33.4 g/dL (ref 30.0–36.0)
Neutro Abs: 2.9 10*3/uL (ref 1.4–7.7)
RBC: 4.18 Mil/uL (ref 3.87–5.11)
WBC: 6.2 10*3/uL (ref 4.5–10.5)

## 2011-07-14 LAB — HEPATIC FUNCTION PANEL
ALT: 19 U/L (ref 0–35)
AST: 20 U/L (ref 0–37)
Albumin: 4.1 g/dL (ref 3.5–5.2)
Alkaline Phosphatase: 53 U/L (ref 39–117)
Bilirubin, Direct: 0.1 mg/dL (ref 0.0–0.3)
Total Protein: 8.3 g/dL (ref 6.0–8.3)

## 2011-07-14 LAB — LIPID PANEL
HDL: 85.8 mg/dL (ref >39.00)
Total CHOL/HDL Ratio: 2

## 2011-07-14 NOTE — Progress Notes (Signed)
Subjective:    Evelyn [name] is a 77 y.o. female who presents for Medicare Annual/Subsequent preventive examination.  Preventive Screening-Counseling & Management  Tobacco History  Smoking status  . Former Smoker  Smokeless tobacco  . Never Used     Problems Prior to Visit 1.   Current Problems (verified) Patient Active Problem List  Diagnoses  . PLANTAR WART, LEFT  . MOLE  . HYPERLIPIDEMIA  . GLAUCOMA, LEFT EYE  . HYPERTENSION  . VARICOSE VEINS, LOWER EXTREMITIES  . ARTIFICIAL MENOPAUSE  . HIP PAIN, RIGHT  . Pain in Soft Tissues of Limb  . OSTEOPOROSIS  . CARDIAC MURMUR  . BREAST BIOPSY, HX OF    Medications Prior to Visit Current Outpatient Prescriptions on File Prior to Visit  Medication Sig Dispense Refill  . alendronate (FOSAMAX) 70 MG tablet Take 70 mg by mouth every 7 (seven) days. Take with a full glass of water on an empty stomach.       Marland Kitchen aspirin 81 MG tablet Take 81 mg by mouth daily.        . calcium carbonate (OS-CAL) 600 MG TABS Take 600 mg by mouth daily.        Marland Kitchen estradiol (ESTRACE) 0.5 MG tablet Take 1 tablet (0.5 mg total) by mouth daily.  30 tablet  11  . fish oil-omega-3 fatty acids 1000 MG capsule Take 1 g by mouth daily.        Marland Kitchen latanoprost (XALATAN) 0.005 % ophthalmic solution Place 1 drop into both eyes at bedtime.        . metoprolol (TOPROL-XL) 50 MG 24 hr tablet TAKE 1 TABLET (50 MG TOTAL) BY MOUTH DAILY.  30 tablet  2  . Multiple Vitamin (MULTIVITAMIN) tablet Take 1 tablet by mouth daily.        . Red Yeast Rice 600 MG TABS Take 1 tablet by mouth daily.        . traMADol (ULTRAM) 50 MG tablet Take 1 tablet (50 mg total) by mouth every 6 (six) hours as needed for pain.  30 tablet  0    Current Medications (verified) Current Outpatient Prescriptions  Medication Sig Dispense Refill  . alendronate (FOSAMAX) 70 MG tablet Take 70 mg by mouth every 7 (seven) days. Take with a full glass of water on an empty stomach.       Marland Kitchen aspirin 81 MG  tablet Take 81 mg by mouth daily.        . calcium carbonate (OS-CAL) 600 MG TABS Take 600 mg by mouth daily.        Marland Kitchen estradiol (ESTRACE) 0.5 MG tablet Take 1 tablet (0.5 mg total) by mouth daily.  30 tablet  11  . fish oil-omega-3 fatty acids 1000 MG capsule Take 1 g by mouth daily.        Marland Kitchen latanoprost (XALATAN) 0.005 % ophthalmic solution Place 1 drop into both eyes at bedtime.        . metoprolol (TOPROL-XL) 50 MG 24 hr tablet TAKE 1 TABLET (50 MG TOTAL) BY MOUTH DAILY.  30 tablet  2  . Multiple Vitamin (MULTIVITAMIN) tablet Take 1 tablet by mouth daily.        . Red Yeast Rice 600 MG TABS Take 1 tablet by mouth daily.        . traMADol (ULTRAM) 50 MG tablet Take 1 tablet (50 mg total) by mouth every 6 (six) hours as needed for pain.  30 tablet  0  Allergies (verified) Vicodin   PAST HISTORY  Family History Family History  Problem Relation Age of Onset  . Heart attack    . Glaucoma    . Lung cancer      Social History History  Substance Use Topics  . Smoking status: Former Games developer  . Smokeless tobacco: Never Used  . Alcohol Use: Yes     Are there smokers in your home (other than you)? No  Risk Factors Current exercise habits: exercising 3x a week  Dietary issues discussed: none   Cardiac risk factors: advanced age (older than 6 for men, 50 for women), dyslipidemia, hypertension and sedentary lifestyle.  Depression Screen (Note: if answer to either of the following is "Yes", a more complete depression screening is indicated)   Over the past two weeks, have you felt down, depressed or hopeless? No  Over the past two weeks, have you felt little interest or pleasure in doing things? No  Have you lost interest or pleasure in daily life? No  Do you often feel hopeless? No  Do you cry easily over simple problems? No  Activities of Daily Living In your present state of health, do you have any difficulty performing the following activities?:  Driving? No Managing  money?  No Feeding yourself? No Getting from bed to chair? No Climbing a flight of stairs? No Preparing food and eating?: No Bathing or showering? No Getting dressed: No Getting to the toilet? No Using the toilet:No Moving around from place to place: No In the past year have you fallen or had a near fall?:No   Are you sexually active?  No  Do you have more than one partner?  No  Hearing Difficulties: No Do you often ask people to speak up or repeat themselves? No Do you experience ringing or noises in your ears? No Do you have difficulty understanding soft or whispered voices? No   Do you feel that you have a problem with memory? No  Do you often misplace items? No  Do you feel safe at home?  Yes  Cognitive Testing  Alert? Yes  Normal Appearance?Yes  Oriented to person? Yes  Place? Yes   Time? Yes  Recall of three objects?  Yes  Can perform simple calculations? Yes  Displays appropriate judgment?Yes  Can read the correct time from a watch face?Yes   Advanced Directives have been discussed with the patient? Yes  List the Names of Other Physician/Practitioners you currently use: 1.    Indicate any recent Medical Services you may have received from other than [facility] providers in the past year (date may be approximate).  Immunization History  Administered Date(s) Administered  . Influenza Whole 11/28/2000, 10/10/2007, 08/20/2008, 09/24/2009, 08/25/2010  . Pneumococcal Polysaccharide 11/28/2000  . Td 11/28/2000  . Zoster 12/28/2007    Screening Tests Health Maintenance  Topic Date Due  . Colonoscopy  01/04/1982  . Tetanus/tdap  11/28/2010  . Influenza Vaccine  08/29/2011  . Pneumococcal Polysaccharide Vaccine Age 31 And Over  Completed  . Zostavax  Completed    All answers were reviewed with the patient and necessary referrals were made:  Loreen Freud, DO   07/14/2011   History reviewed: allergies, current medications, past family history, past medical history, past  social history, past surgical history and problem list  Review of Systems  Review of Systems  Constitutional: Negative for activity change, appetite change and fatigue.  HENT: Negative for hearing loss, congestion, tinnitus and ear discharge.  Dentist-- q62m Eyes:  Negative for visual disturbance (see optho q1y -- vision corrected to 20/20 with glasses).  Respiratory: Negative for cough, chest tightness and shortness of breath.   Cardiovascular: Negative for chest pain, palpitations and leg swelling.  Gastrointestinal: Negative for abdominal pain, diarrhea, constipation and abdominal distention.  Genitourinary: Negative for urgency, frequency, decreased urine volume and difficulty urinating.  Musculoskeletal: Negative for back pain, arthralgias and gait problem.  Skin: Negative for color change, pallor and rash.  Neurological: Negative for dizziness, light-headedness, numbness and headaches.  Hematological: Negative for adenopathy. Does not bruise/bleed easily.  Psychiatric/Behavioral: Negative for suicidal ideas, confusion, sleep disturbance, self-injury, dysphoric mood, decreased concentration and agitation.  Pt is able to read and write and can do all ADLs No risk for falling No abuse/ violence in home     Objective:     Vision by Snellen chart: right eye:pt sees optho q55m .  There is no height on file to calculate BMI. BP 110/76  Pulse 50  Temp(Src) 98.7 F (37.1 C) (Oral)  Resp 16  Wt 129 lb (58.514 kg)  SpO2 98%  BP 110/76  Pulse 50  Temp(Src) 98.7 F (37.1 C) (Oral)  Resp 16  Wt 129 lb (58.514 kg)  SpO2 98% General appearance: alert, cooperative, appears stated age and no distress Head: Normocephalic, without obvious abnormality, atraumatic Eyes: conjunctivae/corneas clear. PERRL, EOM&#39;s intact. Fundi benign. Ears: normal TM's and external ear canals both ears Nose: Nares normal. Septum midline. Mucosa normal. No drainage or sinus tenderness. Throat: lips,  mucosa, and tongue normal; teeth and gums normal Neck: no adenopathy, no carotid bruit, no JVD, supple, symmetrical, trachea midline and thyroid not enlarged, symmetric, no tenderness/mass/nodules Back: symmetric, no curvature. ROM normal. No CVA tenderness. Lungs: clear to auscultation bilaterally Breasts: normal appearance, no masses or tenderness Heart: regular rate and rhythm, S1, S2 normal, no murmur, click, rub or gallop Abdomen: soft, non-tender; bowel sounds normal; no masses,  no organomegaly Extremities: extremities normal, atraumatic, no cyanosis or edema Pulses: 2+ and symmetric Skin: Skin color, texture, turgor normal. No rashes or lesions Lymph nodes: Cervical, supraclavicular, and axillary nodes normal. Neurologic: Alert and oriented X 3, normal strength and tone. Normal symmetric reflexes. Normal coordination and gait   psych--no depression/ anxiety   Assessment:     Preventative care--check ghm HTN----stable, con't med Osteoporosis--- bmd done--pt would like to get prolia Hyperlipidemia---- check labs     Plan:     During the course of the visit the patient was educated and counseled about appropriate screening and preventive services including:    Screening electrocardiogram  Screening mammography  Bone densitometry screening  Advanced directives: power of attorney for healthcare on file  bmd and mammo done  Diet review for nutrition referral? Yes ____  Not Indicated ___x_   Patient Instructions (the written plan) was given to the patient.  Medicare Attestation I have personally reviewed: The patient's medical and social history Their use of alcohol, tobacco or illicit drugs Their current medications and supplements The patient's functional ability including ADLs,fall risks, home safety risks, cognitive, and hearing and visual impairment Diet and physical activities Evidence for depression or mood disorders  The patient's weight, height, BMI,  and visual acuity have been recorded in the chart.  I have made referrals, counseling, and provided education to the patient based on review of the above and I have provided the patient with a written personalized care plan for preventive services.     Loreen Freud, DO   07/14/2011

## 2011-07-14 NOTE — Patient Instructions (Signed)

## 2011-07-15 ENCOUNTER — Encounter: Payer: Self-pay | Admitting: Family Medicine

## 2011-08-05 ENCOUNTER — Encounter: Payer: Self-pay | Admitting: Family Medicine

## 2011-08-05 ENCOUNTER — Ambulatory Visit (INDEPENDENT_AMBULATORY_CARE_PROVIDER_SITE_OTHER): Payer: Medicare Other | Admitting: Family Medicine

## 2011-08-05 VITALS — BP 130/72 | HR 71 | Temp 99.3°F | Wt 128.4 lb

## 2011-08-05 DIAGNOSIS — H6120 Impacted cerumen, unspecified ear: Secondary | ICD-10-CM

## 2011-08-05 DIAGNOSIS — J069 Acute upper respiratory infection, unspecified: Secondary | ICD-10-CM

## 2011-08-05 DIAGNOSIS — Z0000 Encounter for general adult medical examination without abnormal findings: Secondary | ICD-10-CM

## 2011-08-05 DIAGNOSIS — Z23 Encounter for immunization: Secondary | ICD-10-CM

## 2011-08-05 MED ORDER — MOMETASONE FUROATE 50 MCG/ACT NA SUSP: 2.0000 | Freq: Every day | NASAL | Status: DC

## 2011-08-05 NOTE — Patient Instructions (Signed)
Common Cold, Adult An upper respiratory tract infection, or cold, is a viral infection of the air passages to the lung. Colds are contagious, especially during the first 3 or 4 days. Antibiotics cannot cure a cold. Cold germs are spread by coughs, sneezes, and hand to hand contact. A respiratory tract infection usually clears up in a few days, but some people may be sick for a week or two. HOME CARE INSTRUCTIONS  Only take over-the-counter or prescription medicines for pain, discomfort, or fever as directed by your caregiver.   Be careful not to blow your nose too hard. This may cause a nosebleed.   Use a cool-mist humidifier (vaporizer) to increase air moisture. This will make it easier for you to breath. Do not use hot steam.   Rest as much as possible and get plenty of sleep.   Wash your hands often, especially after you blow your nose. Cover your mouth and nose with a tissue when you sneeze or cough.   Drink at least 8 glasses of clear liquids every day, such as water, fruit juices, tea, clear soups, and carbonated beverages.  SEEK MEDICAL CARE IF:  An oral temperature above 100.4 lasts 4 days or more, and is not controlled by medication.   You have a sore throat that gets worse or you see white or yellow spots in your throat.   Your cough gets worse or lasts more than 10 days.   You have a rash somewhere on your skin. You have large and tender lumps in your neck.   You have an earache or a headache.   You have thick, greenish or yellowish discharge from your nose.   You cough-up thick yellow, green, gray or bloody mucus (secretions).  SEEK IMMEDIATE MEDICAL CARE IF: You have trouble breathing, chest pain, or your skin or nails look gray or blue. MAKE SURE YOU:   Understand these instructions.   Will watch your condition.   Will get help right away if you are not doing well or get worse.  Document Released: 11/11/2000 Document Re-Released: 10/27/2008 [employer] Patient  Information 2011 [employer]. 

## 2011-08-08 ENCOUNTER — Encounter: Payer: Self-pay | Admitting: Family Medicine

## 2011-08-08 NOTE — Progress Notes (Signed)
  Subjective:    Patient ID: Evelyn [name], female    DOB: 1929/12/04, 77 y.o.   MRN: [id number]  HPI Pt here c/o dec hearing --esp L ear.  Pt thinks they may need to be irrigated.  No other complaints.   Review of Systems    as  above Objective:   Physical Exam  Constitutional: She appears well-developed and well-nourished.  HENT:  Right Ear: External ear normal.       L ear== + cerumen impaction r ear normal  Cardiovascular: Normal rate and regular rhythm.   Pulmonary/Chest: Effort normal and breath sounds normal.  Abdominal: Soft. Bowel sounds are normal.  Neurological: She is alert.          Assessment & Plan:  L ear-- cerumen impaction,  Irrigated with good results             Some improvement with hearing --- if pt notices no improvement over next few days-- refer ENT URI-- con't nasonex and take antihistamine  daily

## 2011-08-22 ENCOUNTER — Ambulatory Visit (INDEPENDENT_AMBULATORY_CARE_PROVIDER_SITE_OTHER): Payer: Medicare Other

## 2011-08-22 ENCOUNTER — Ambulatory Visit: Payer: Medicare Other

## 2011-08-22 DIAGNOSIS — M810 Age-related osteoporosis without current pathological fracture: Secondary | ICD-10-CM

## 2011-08-22 MED ORDER — DENOSUMAB 60 MG/ML ~~LOC~~ SOLN
60.0000 mg | Freq: Once | SUBCUTANEOUS | Status: AC
  Administered 2011-08-22: 60 mg via SUBCUTANEOUS

## 2011-09-15 ENCOUNTER — Ambulatory Visit (INDEPENDENT_AMBULATORY_CARE_PROVIDER_SITE_OTHER): Payer: Medicare Other

## 2011-09-15 DIAGNOSIS — Z23 Encounter for immunization: Secondary | ICD-10-CM

## 2011-10-12 ENCOUNTER — Other Ambulatory Visit: Payer: Self-pay | Admitting: Family Medicine

## 2011-11-14 ENCOUNTER — Telehealth: Payer: Self-pay

## 2011-11-14 NOTE — Telephone Encounter (Signed)
Says her Estradiol is not available at the pharmacy and wanted to know if she could get something else generic called into the pharmacy. Please advise    KP

## 2011-11-14 NOTE — Telephone Encounter (Signed)
This is confusing ----she wants cream instead?----then she would use premarin vaginal  1 g pv qd x1 week then 0.5 mg pv qd for 1 week then 0.5mg  pv 3x a week.-----can wean down to 1x a week

## 2011-11-15 NOTE — Telephone Encounter (Signed)
Patient stated she wanted to have something on hand just in case she had any symptoms in the future. Please advise    KP

## 2011-11-15 NOTE — Telephone Encounter (Signed)
If she is not having any symptoms she may not need to take anything.

## 2011-11-15 NOTE — Telephone Encounter (Signed)
Spoke with patient and she stated she tried to fill the Rx that was given to her instead of the premarin and she was told  By the pharmacy they could not order it at [facility]. She wanted to know if there was another generic that we could order but she does not want premarin. She is not currently taking anything right now and not having any symptoms. Please advise   KP

## 2011-11-16 NOTE — Telephone Encounter (Signed)
msg left to call the office     KP 

## 2011-11-16 NOTE — Telephone Encounter (Signed)
It really doesn't work like that.  If symptoms develop we can discuss new meds.

## 2011-11-21 NOTE — Telephone Encounter (Signed)
msg left to call the office     KP 

## 2011-11-23 NOTE — Telephone Encounter (Signed)
3rd attempt to contact patient.Marland KitchenMarland KitchenLmtc the office--- will mail letter today if no response    KP

## 2011-11-25 NOTE — Telephone Encounter (Signed)
Letter mailed     KP 

## 2011-12-12 ENCOUNTER — Other Ambulatory Visit: Payer: Self-pay | Admitting: Family Medicine

## 2012-01-09 ENCOUNTER — Encounter: Payer: Self-pay | Admitting: Family Medicine

## 2012-01-09 ENCOUNTER — Ambulatory Visit (INDEPENDENT_AMBULATORY_CARE_PROVIDER_SITE_OTHER): Payer: Medicare Other | Admitting: Family Medicine

## 2012-01-09 VITALS — BP 122/76 | HR 63 | Temp 98.3°F | Wt 132.8 lb

## 2012-01-09 DIAGNOSIS — J329 Chronic sinusitis, unspecified: Secondary | ICD-10-CM

## 2012-01-09 DIAGNOSIS — H109 Unspecified conjunctivitis: Secondary | ICD-10-CM

## 2012-01-09 MED ORDER — MOXIFLOXACIN HCL 0.5 % OP SOLN: 1.0000 [drp] | Freq: Three times a day (TID) | OPHTHALMIC | Status: AC

## 2012-01-09 MED ORDER — AMOXICILLIN-POT CLAVULANATE 875-125 MG PO TABS: 1.0000 | ORAL_TABLET | Freq: Two times a day (BID) | ORAL | Status: AC

## 2012-01-09 NOTE — Patient Instructions (Signed)

## 2012-01-09 NOTE — Progress Notes (Signed)
  Subjective:     Evelyn [name] is a 78 y.o. female who presents for evaluation of sinus pain. Symptoms include: congestion, cough, facial pain, headaches, nasal congestion, sinus pressure and eye drainage. Onset of symptoms was 2 weeks ago. Symptoms have been gradually worsening since that time. Past history is significant for no history of pneumonia or bronchitis. Patient is a non-smoker.  The following portions of the patient's history were reviewed and updated as appropriate: allergies, current medications, past family history, past medical history, past social history, past surgical history and problem list.  Review of Systems Pertinent items are noted in HPI.   Objective:    BP 122/76  Pulse 63  Temp(Src) 98.3 F (36.8 C) (Oral)  Wt 132 lb 12.8 oz (60.238 kg)  SpO2 95% General appearance: alert, cooperative, appears stated age and mild distress Ears: fluid in ears Nose: green discharge, moderate congestion, turbinates red, swollen, edematous, sinus tenderness bilateral Throat: lips, mucosa, and tongue normal; teeth and gums normal Neck: mild anterior cervical adenopathy, supple, symmetrical, trachea midline and thyroid not enlarged, symmetric, no tenderness/mass/nodules Lungs: clear to auscultation bilaterally Heart: S1, S2 normal    Assessment:    Acute bacterial sinusitis.  Conjunctivitis- vigamox- f/u opth in 2-3 days if no better  Plan:    Nasal steroids per medication orders. Antihistamines per medication orders. Augmentin per medication orders. f/u prn

## 2012-01-19 ENCOUNTER — Telehealth: Payer: Self-pay

## 2012-01-19 MED ORDER — CLARITHROMYCIN ER 500 MG PO TB24: 1000.0000 mg | ORAL_TABLET | Freq: Every day | ORAL | Status: AC

## 2012-01-19 NOTE — Telephone Encounter (Signed)
I tried calling patient but the line was busy-msg sent to the pharmacist to contact the patient     KP

## 2012-01-19 NOTE — Telephone Encounter (Signed)
biaxin xl 2 po qd for 7 days  #14

## 2012-01-19 NOTE — Telephone Encounter (Signed)
Call fom patient and she stated she is down to her last ABX but her ears and her head are still stopped up, she stated her ears are cracking and some cough and wanted to know what you recommend.  Please Advise     KP

## 2012-02-21 ENCOUNTER — Telehealth: Payer: Self-pay | Admitting: Family Medicine

## 2012-02-21 NOTE — Telephone Encounter (Signed)
This is already here and her injection was due 02/19/12.     KP

## 2012-02-21 NOTE — Telephone Encounter (Signed)
Not sure why this was sent to me

## 2012-02-21 NOTE — Telephone Encounter (Signed)
Patient called to schedule prolia injection for 02/28/2012, patient stated insurance will pay for 1/2 & she pays the other. Can you please order for this patient Thanks

## 2012-02-28 ENCOUNTER — Ambulatory Visit (INDEPENDENT_AMBULATORY_CARE_PROVIDER_SITE_OTHER): Payer: Medicare Other | Admitting: *Deleted

## 2012-02-28 DIAGNOSIS — M810 Age-related osteoporosis without current pathological fracture: Secondary | ICD-10-CM

## 2012-02-28 MED ORDER — DENOSUMAB 60 MG/ML ~~LOC~~ SOLN
60.0000 mg | Freq: Once | SUBCUTANEOUS | Status: AC
  Administered 2012-02-28: 60 mg via SUBCUTANEOUS

## 2012-04-11 ENCOUNTER — Other Ambulatory Visit: Payer: Self-pay | Admitting: Family Medicine

## 2012-07-12 ENCOUNTER — Other Ambulatory Visit: Payer: Self-pay | Admitting: Family Medicine

## 2012-09-04 ENCOUNTER — Other Ambulatory Visit: Payer: Self-pay

## 2012-09-04 MED ORDER — METOPROLOL SUCCINATE ER 50 MG PO TB24: 50.0000 mg | ORAL_TABLET | Freq: Every day | ORAL | Status: DC

## 2012-09-12 ENCOUNTER — Encounter: Payer: Self-pay | Admitting: Family Medicine

## 2012-09-20 ENCOUNTER — Ambulatory Visit (INDEPENDENT_AMBULATORY_CARE_PROVIDER_SITE_OTHER): Payer: Medicare Other

## 2012-09-20 DIAGNOSIS — M810 Age-related osteoporosis without current pathological fracture: Secondary | ICD-10-CM

## 2012-09-20 DIAGNOSIS — Z23 Encounter for immunization: Secondary | ICD-10-CM

## 2012-09-20 MED ORDER — DENOSUMAB 60 MG/ML ~~LOC~~ SOLN
60.0000 mg | Freq: Once | SUBCUTANEOUS | Status: AC
  Administered 2012-09-20: 60 mg via SUBCUTANEOUS

## 2012-09-20 NOTE — Addendum Note (Signed)
Addended by: Arnette Norris on: 09/20/2012 04:32 PM   Modules accepted: Orders

## 2012-11-05 ENCOUNTER — Other Ambulatory Visit: Payer: Self-pay | Admitting: Family Medicine

## 2012-12-03 ENCOUNTER — Other Ambulatory Visit: Payer: Self-pay | Admitting: Family Medicine

## 2012-12-03 ENCOUNTER — Encounter: Payer: Medicare Other | Admitting: Family Medicine

## 2012-12-03 DIAGNOSIS — Z0289 Encounter for other administrative examinations: Secondary | ICD-10-CM

## 2012-12-10 ENCOUNTER — Other Ambulatory Visit: Payer: Self-pay

## 2012-12-10 MED ORDER — METOPROLOL SUCCINATE ER 50 MG PO TB24: 50.0000 mg | ORAL_TABLET | Freq: Every day | ORAL | Status: DC

## 2012-12-10 NOTE — Telephone Encounter (Signed)
Call from the patient requesting Her medications go to [facility] on [address] per her insurance company.      KP

## 2013-01-28 ENCOUNTER — Encounter: Payer: Self-pay | Admitting: Family Medicine

## 2013-01-28 ENCOUNTER — Ambulatory Visit (INDEPENDENT_AMBULATORY_CARE_PROVIDER_SITE_OTHER): Payer: Medicare Other | Admitting: Family Medicine

## 2013-01-28 VITALS — BP 210/100 | HR 58 | Temp 98.1°F | Wt 138.4 lb

## 2013-01-28 DIAGNOSIS — R002 Palpitations: Secondary | ICD-10-CM

## 2013-01-28 DIAGNOSIS — I10 Essential (primary) hypertension: Secondary | ICD-10-CM

## 2013-01-28 LAB — BASIC METABOLIC PANEL
BUN: 12 mg/dL (ref 6–23)
Chloride: 102 mEq/L (ref 96–112)
Potassium: 3.8 mEq/L (ref 3.5–5.1)
Sodium: 139 mEq/L (ref 135–145)

## 2013-01-28 LAB — CBC WITH DIFFERENTIAL/PLATELET
Basophils Relative: 0.5 % (ref 0.0–3.0)
Eosinophils Relative: 1.7 % (ref 0.0–5.0)
HCT: 41.2 % (ref 36.0–46.0)
Hemoglobin: 13.8 g/dL (ref 12.0–15.0)
Lymphs Abs: 3 10*3/uL (ref 0.7–4.0)
MCV: 91.9 fl (ref 78.0–100.0)
Monocytes Absolute: 0.7 10*3/uL (ref 0.1–1.0)
Neutro Abs: 2.9 10*3/uL (ref 1.4–7.7)
RBC: 4.49 Mil/uL (ref 3.87–5.11)
WBC: 6.9 10*3/uL (ref 4.5–10.5)

## 2013-01-28 LAB — HEPATIC FUNCTION PANEL
ALT: 20 U/L (ref 0–35)
AST: 18 U/L (ref 0–37)
Total Protein: 8.5 g/dL — ABNORMAL HIGH (ref 6.0–8.3)

## 2013-01-28 LAB — POCT URINALYSIS DIPSTICK
Blood, UA: NEGATIVE
Glucose, UA: NEGATIVE
Ketones, UA: NEGATIVE
Spec Grav, UA: <=1.005

## 2013-01-28 LAB — TSH: TSH: 2.85 u[IU]/mL (ref 0.35–5.50)

## 2013-01-28 MED ORDER — LISINOPRIL-HYDROCHLOROTHIAZIDE 20-25 MG PO TABS: 1.0000 | ORAL_TABLET | Freq: Every day | ORAL | Status: DC

## 2013-01-28 MED ORDER — AMOXICILLIN-POT CLAVULANATE 875-125 MG PO TABS: 1.0000 | ORAL_TABLET | Freq: Two times a day (BID) | ORAL | Status: DC

## 2013-01-28 NOTE — Progress Notes (Signed)
  Subjective:    Patient here for follow-up of elevated blood pressure.  She is exercising and is adherent to a low-salt diet.  Blood pressure is not well controlled at home. Cardiac symptoms: dizziness. Patient denies: chest pain, chest pressure/discomfort, claudication, dyspnea, exertional chest pressure/discomfort, fatigue, irregular heart beat, lower extremity edema, near-syncope, orthopnea, paroxysmal nocturnal dyspnea, syncope and tachypnea. Cardiovascular risk factors: advanced age (older than 35 for men, 70 for women), hypertension and sedentary lifestyle. Use of agents associated with hypertension: none. History of target organ damage: none.  The following portions of the patient's history were reviewed and updated as appropriate: allergies, current medications, past family history, past medical history, past social history, past surgical history and problem list.  Review of Systems Pertinent items are noted in HPI.     Objective:    BP 210/100  Pulse 58  Temp(Src) 98.1 F (36.7 C) (Oral)  Wt 138 lb 6.4 oz (62.778 kg)  BMI 25.12 kg/m2  SpO2 97% General appearance: alert, cooperative, appears stated age and no distress Lungs: clear to auscultation bilaterally Heart: S1, S2 normal Extremities: extremities normal, atraumatic, no cyanosis or edema    Assessment:    Hypertension, stage 2 . Evidence of target organ damage: none.    Plan:    Medication: continue metoprolol and begin lisinopril hct. Dietary sodium restriction. Regular aerobic exercise. Check blood pressures 1 times daily and record. Follow up: 2 weeks and as needed.

## 2013-01-28 NOTE — Patient Instructions (Addendum)

## 2013-01-29 ENCOUNTER — Telehealth: Payer: Self-pay

## 2013-01-29 NOTE — Telephone Encounter (Signed)
MSG from patient and she stated shew got a new Rx for a BP medication yesterday and she wanted to know if she should take it with the Metoprolol.  Plan:     Medication: continue metoprolol and begin lisinopril hct. Dietary sodium restriction. Regular aerobic exercise. Check blood pressures 1 times daily and record. Follow up: 2 weeks and as needed.    Discussed with patient and she voiced understanding her BP is down to 175/88. I made her aware to continue to drink plenty of water, watch her diet and salt intake and if she develops any other problems or BP rises to give [place] a call and she agreed to do so.     KP

## 2013-02-04 ENCOUNTER — Telehealth: Payer: Self-pay | Admitting: Family Medicine

## 2013-02-04 ENCOUNTER — Telehealth: Payer: Self-pay

## 2013-02-04 NOTE — Telephone Encounter (Signed)
Pt needs ov. 

## 2013-02-04 NOTE — Telephone Encounter (Signed)
Patient states that her bp was running high over the weekend  200/107 135/83 176/98 She did not give me times as to when she took her bp.   She says that today her bp was reading 154/84. Patient has appointment on 3/17, does she need to be seen sooner?

## 2013-02-04 NOTE — Telephone Encounter (Signed)
Spoke with patient on her cell phone and scheuduled her an apt for tomorrow. Her BP at 3 pm was 154/84 and at 3:30 it was up to 203/96. I made the patient aware that if she started to have chest pain, weakness, numbness, or an urgent symptoms to call 911 and she agreed to do so, at this time she feels fine.       KP//CMA

## 2013-02-04 NOTE — Telephone Encounter (Signed)
Evelyn [name] at 02/04/2013 3:25 PM   Status: Signed            Patient states that her bp was running high over the weekend  200/107  135/83  176/98  She did not give me times as to when she took her bp.  She says that today her bp was reading 154/84. Patient has appointment on 3/17, does she need to be seen sooner?

## 2013-02-04 NOTE — Telephone Encounter (Signed)
MSG on my voicemail from Friday from the patient advising that her BP is elevated. She had taken both BP med's and int he morning her BP was 137/71 pulse 52, in the afternoon it was 202/120 p 54 and later that day it was 196/108 p 55. I have tried to call the patient patient on her home humber but the line is busy (fast busy tone). I will try again later today.     KP//CMA

## 2013-02-04 NOTE — Telephone Encounter (Signed)
Duplicate   KP 

## 2013-02-05 ENCOUNTER — Ambulatory Visit (INDEPENDENT_AMBULATORY_CARE_PROVIDER_SITE_OTHER): Payer: Medicare Other | Admitting: Family Medicine

## 2013-02-05 ENCOUNTER — Encounter: Payer: Self-pay | Admitting: Family Medicine

## 2013-02-05 VITALS — BP 160/103 | HR 54 | Temp 98.5°F | Wt 136.6 lb

## 2013-02-05 DIAGNOSIS — I739 Peripheral vascular disease, unspecified: Secondary | ICD-10-CM

## 2013-02-05 DIAGNOSIS — M25552 Pain in left hip: Secondary | ICD-10-CM | POA: Insufficient documentation

## 2013-02-05 DIAGNOSIS — M25569 Pain in unspecified knee: Secondary | ICD-10-CM

## 2013-02-05 DIAGNOSIS — I10 Essential (primary) hypertension: Secondary | ICD-10-CM

## 2013-02-05 MED ORDER — AMLODIPINE BESYLATE 5 MG PO TABS: 5.0000 mg | ORAL_TABLET | Freq: Every day | ORAL | Status: DC

## 2013-02-05 NOTE — Assessment & Plan Note (Signed)
B/l leg pain with walking-- suspect PVD Check art dopplers

## 2013-02-05 NOTE — Patient Instructions (Addendum)

## 2013-02-05 NOTE — Progress Notes (Signed)
  Subjective:    Patient ID: Evelyn [name], female    DOB: 12/03/1929, 79 y.o.   MRN: [id number]  HPI Pt here for bp check.  Feeling a little better but still running high.  See home readings." Pt is also c/o leg pain after getting up to walk a short distance and it goes away with rest-- she is concerned about "PVD"   Review of Systems As above    Objective:   Physical Exam BP 160/103  Pulse 54  Temp(Src) 98.5 F (36.9 C) (Oral)  Wt 136 lb 9.6 oz (61.961 kg)  BMI 24.79 kg/m2  SpO2 97% General appearance: alert, cooperative, appears stated age and no distress Neck: no adenopathy, no carotid bruit, no JVD, supple, symmetrical, trachea midline and thyroid not enlarged, symmetric, no tenderness/mass/nodules Lungs: clear to auscultation bilaterally Heart: S1, S2 normal Ext-- no calf pain      Assessment & Plan:

## 2013-02-05 NOTE — Assessment & Plan Note (Signed)
Add norvasc 5 mg daily Cont other meds rto 2-3 weeks

## 2013-02-10 ENCOUNTER — Other Ambulatory Visit: Payer: Self-pay | Admitting: Family Medicine

## 2013-02-11 ENCOUNTER — Telehealth: Payer: Self-pay

## 2013-02-11 ENCOUNTER — Ambulatory Visit: Payer: Medicare Other | Admitting: Family Medicine

## 2013-02-11 NOTE — Telephone Encounter (Signed)
Break norvasc in 1/2

## 2013-02-11 NOTE — Telephone Encounter (Signed)
Msg from patient c/o BP running low: 123/62, 109/54, 118/62 and 99/54 yesterday. She said she has been feeling "swimmy headed" and wanted to know what she should do. Please advise          KP//CMA

## 2013-02-12 NOTE — Telephone Encounter (Signed)
Discuss with patient  

## 2013-02-13 ENCOUNTER — Telehealth: Payer: Self-pay

## 2013-02-13 NOTE — Telephone Encounter (Signed)
Error in encounter---  KP

## 2013-02-14 ENCOUNTER — Encounter: Payer: Medicare Other | Admitting: Family Medicine

## 2013-02-15 ENCOUNTER — Telehealth: Payer: Self-pay | Admitting: *Deleted

## 2013-02-15 NOTE — Telephone Encounter (Signed)
Pt left VM that even with decrease in BP she is still feeling "swimmy headed". Pt indicated that symptoms have not improved at all. Pt notes that BP today was 117/64 and on yesterday 121/63 pulse 51.    Left Pt detail message about sat clinic if she would like to be seen for BP issue or  OV next week to see Dr Laury Axon. Pt advise if BP  continues to drop and symptoms increase that she will need to be seen in ED/UC

## 2013-02-20 ENCOUNTER — Encounter: Payer: Self-pay | Admitting: Family Medicine

## 2013-02-20 ENCOUNTER — Ambulatory Visit (INDEPENDENT_AMBULATORY_CARE_PROVIDER_SITE_OTHER): Payer: Medicare Other | Admitting: Family Medicine

## 2013-02-20 VITALS — BP 110/72 | HR 54 | Temp 98.2°F | Ht 61.0 in | Wt 136.0 lb

## 2013-02-20 DIAGNOSIS — E785 Hyperlipidemia, unspecified: Secondary | ICD-10-CM

## 2013-02-20 DIAGNOSIS — Z0000 Encounter for general adult medical examination without abnormal findings: Secondary | ICD-10-CM

## 2013-02-20 DIAGNOSIS — I10 Essential (primary) hypertension: Secondary | ICD-10-CM

## 2013-02-20 DIAGNOSIS — M810 Age-related osteoporosis without current pathological fracture: Secondary | ICD-10-CM

## 2013-02-20 MED ORDER — AMLODIPINE BESYLATE 5 MG PO TABS: 2.5000 mg | ORAL_TABLET | Freq: Every day | ORAL | Status: DC

## 2013-02-20 MED ORDER — LISINOPRIL-HYDROCHLOROTHIAZIDE 20-25 MG PO TABS: 1.0000 | ORAL_TABLET | Freq: Every day | ORAL | Status: DC

## 2013-02-20 MED ORDER — METOPROLOL SUCCINATE ER 50 MG PO TB24: ORAL_TABLET | ORAL | Status: DC

## 2013-02-20 NOTE — Patient Instructions (Addendum)
Preventive Care for Adults, Female A healthy lifestyle and preventive care can promote health and wellness. Preventive health guidelines for women include the following key practices.  A routine yearly physical is a good way to check with your caregiver about your health and preventive screening. It is a chance to share any concerns and updates on your health, and to receive a thorough exam.  Visit your dentist for a routine exam and preventive care every 6 months. Brush your teeth twice a day and floss once a day. Good oral hygiene prevents tooth decay and gum disease.  The frequency of eye exams is based on your age, health, family medical history, use of contact lenses, and other factors. Follow your caregiver's recommendations for frequency of eye exams.  Eat a healthy diet. Foods like vegetables, fruits, whole grains, low-fat dairy products, and lean protein foods contain the nutrients you need without too many calories. Decrease your intake of foods high in solid fats, added sugars, and salt. Eat the right amount of calories for you.Get information about a proper diet from your caregiver, if necessary.  Regular physical exercise is one of the most important things you can do for your health. Most adults should get at least 150 minutes of moderate-intensity exercise (any activity that increases your heart rate and causes you to sweat) each week. In addition, most adults need muscle-strengthening exercises on 2 or more days a week.  Maintain a healthy weight. The body mass index (BMI) is a screening tool to identify possible weight problems. It provides an estimate of body fat based on height and weight. Your caregiver can help determine your BMI, and can help you achieve or maintain a healthy weight.For adults 20 years and older:  A BMI below 18.5 is considered underweight.  A BMI of 18.5 to 24.9 is normal.  A BMI of 25 to 29.9 is considered overweight.  A BMI of 30 and above is  considered obese.  Maintain normal blood lipids and cholesterol levels by exercising and minimizing your intake of saturated fat. Eat a balanced diet with plenty of fruit and vegetables. Blood tests for lipids and cholesterol should begin at age 20 and be repeated every 5 years. If your lipid or cholesterol levels are high, you are over 50, or you are at high risk for heart disease, you may need your cholesterol levels checked more frequently.Ongoing high lipid and cholesterol levels should be treated with medicines if diet and exercise are not effective.  If you smoke, find out from your caregiver how to quit. If you do not use tobacco, do not start.  If you are pregnant, do not drink alcohol. If you are breastfeeding, be very cautious about drinking alcohol. If you are not pregnant and choose to drink alcohol, do not exceed 1 drink per day. One drink is considered to be 12 ounces (355 mL) of beer, 5 ounces (148 mL) of wine, or 1.5 ounces (44 mL) of liquor.  Avoid use of street drugs. Do not share needles with anyone. Ask for help if you need support or instructions about stopping the use of drugs.  High blood pressure causes heart disease and increases the risk of stroke. Your blood pressure should be checked at least every 1 to 2 years. Ongoing high blood pressure should be treated with medicines if weight loss and exercise are not effective.  If you are 55 to 77 years old, ask your caregiver if you should take aspirin to prevent strokes.  Diabetes   screening involves taking a blood sample to check your fasting blood sugar level. This should be done once every 3 years, after age 45, if you are within normal weight and without risk factors for diabetes. Testing should be considered at a younger age or be carried out more frequently if you are overweight and have at least 1 risk factor for diabetes.  Breast cancer screening is essential preventive care for women. You should practice "breast  self-awareness." This means understanding the normal appearance and feel of your breasts and may include breast self-examination. Any changes detected, no matter how small, should be reported to a caregiver. Women in their 20s and 30s should have a clinical breast exam (CBE) by a caregiver as part of a regular health exam every 1 to 3 years. After age 40, women should have a CBE every year. Starting at age 40, women should consider having a mammography (breast X-ray test) every year. Women who have a family history of breast cancer should talk to their caregiver about genetic screening. Women at a high risk of breast cancer should talk to their caregivers about having magnetic resonance imaging (MRI) and a mammography every year.  The Pap test is a screening test for cervical cancer. A Pap test can show cell changes on the cervix that might become cervical cancer if left untreated. A Pap test is a procedure in which cells are obtained and examined from the lower end of the uterus (cervix).  Women should have a Pap test starting at age 21.  Between ages 21 and 29, Pap tests should be repeated every 2 years.  Beginning at age 30, you should have a Pap test every 3 years as long as the past 3 Pap tests have been normal.  Some women have medical problems that increase the chance of getting cervical cancer. Talk to your caregiver about these problems. It is especially important to talk to your caregiver if a new problem develops soon after your last Pap test. In these cases, your caregiver may recommend more frequent screening and Pap tests.  The above recommendations are the same for women who have or have not gotten the vaccine for human papillomavirus (HPV).  If you had a hysterectomy for a problem that was not cancer or a condition that could lead to cancer, then you no longer need Pap tests. Even if you no longer need a Pap test, a regular exam is a good idea to make sure no other problems are  starting.  If you are between ages 65 and 70, and you have had normal Pap tests going back 10 years, you no longer need Pap tests. Even if you no longer need a Pap test, a regular exam is a good idea to make sure no other problems are starting.  If you have had past treatment for cervical cancer or a condition that could lead to cancer, you need Pap tests and screening for cancer for at least 20 years after your treatment.  If Pap tests have been discontinued, risk factors (such as a new sexual partner) need to be reassessed to determine if screening should be resumed.  The HPV test is an additional test that may be used for cervical cancer screening. The HPV test looks for the virus that can cause the cell changes on the cervix. The cells collected during the Pap test can be tested for HPV. The HPV test could be used to screen women aged 30 years and older, and should   be used in women of any age who have unclear Pap test results. After the age of 30, women should have HPV testing at the same frequency as a Pap test.  Colorectal cancer can be detected and often prevented. Most routine colorectal cancer screening begins at the age of 50 and continues through age 75. However, your caregiver may recommend screening at an earlier age if you have risk factors for colon cancer. On a yearly basis, your caregiver may provide home test kits to check for hidden blood in the stool. Use of a small camera at the end of a tube, to directly examine the colon (sigmoidoscopy or colonoscopy), can detect the earliest forms of colorectal cancer. Talk to your caregiver about this at age 50, when routine screening begins. Direct examination of the colon should be repeated every 5 to 10 years through age 75, unless early forms of pre-cancerous polyps or small growths are found.  Hepatitis C blood testing is recommended for all people born from 1945 through 1965 and any individual with known risks for hepatitis C.  Practice  safe sex. Use condoms and avoid high-risk sexual practices to reduce the spread of sexually transmitted infections (STIs). STIs include gonorrhea, chlamydia, syphilis, trichomonas, herpes, HPV, and human immunodeficiency virus (HIV). Herpes, HIV, and HPV are viral illnesses that have no cure. They can result in disability, cancer, and death. Sexually active women aged 25 and younger should be checked for chlamydia. Older women with new or multiple partners should also be tested for chlamydia. Testing for other STIs is recommended if you are sexually active and at increased risk.  Osteoporosis is a disease in which the bones lose minerals and strength with aging. This can result in serious bone fractures. The risk of osteoporosis can be identified using a bone density scan. Women ages 65 and over and women at risk for fractures or osteoporosis should discuss screening with their caregivers. Ask your caregiver whether you should take a calcium supplement or vitamin D to reduce the rate of osteoporosis.  Menopause can be associated with physical symptoms and risks. Hormone replacement therapy is available to decrease symptoms and risks. You should talk to your caregiver about whether hormone replacement therapy is right for you.  Use sunscreen with sun protection factor (SPF) of 30 or more. Apply sunscreen liberally and repeatedly throughout the day. You should seek shade when your shadow is shorter than you. Protect yourself by wearing long sleeves, pants, a wide-brimmed hat, and sunglasses year round, whenever you are outdoors.  Once a month, do a whole body skin exam, using a mirror to look at the skin on your back. Notify your caregiver of new moles, moles that have irregular borders, moles that are larger than a pencil eraser, or moles that have changed in shape or color.  Stay current with required immunizations.  Influenza. You need a dose every fall (or winter). The composition of the flu vaccine  changes each year, so being vaccinated once is not enough.  Pneumococcal polysaccharide. You need 1 to 2 doses if you smoke cigarettes or if you have certain chronic medical conditions. You need 1 dose at age 65 (or older) if you have never been vaccinated.  Tetanus, diphtheria, pertussis (Tdap, Td). Get 1 dose of Tdap vaccine if you are younger than age 65, are over 65 and have contact with an infant, are a healthcare worker, are pregnant, or simply want to be protected from whooping cough. After that, you need a Td   booster dose every 10 years. Consult your caregiver if you have not had at least 3 tetanus and diphtheria-containing shots sometime in your life or have a deep or dirty wound.  HPV. You need this vaccine if you are a woman age 26 or younger. The vaccine is given in 3 doses over 6 months.  Measles, mumps, rubella (MMR). You need at least 1 dose of MMR if you were born in 1957 or later. You may also need a second dose.  Meningococcal. If you are age 19 to 21 and a first-year college student living in a residence hall, or have one of several medical conditions, you need to get vaccinated against meningococcal disease. You may also need additional booster doses.  Zoster (shingles). If you are age 60 or older, you should get this vaccine.  Varicella (chickenpox). If you have never had chickenpox or you were vaccinated but received only 1 dose, talk to your caregiver to find out if you need this vaccine.  Hepatitis A. You need this vaccine if you have a specific risk factor for hepatitis A virus infection or you simply wish to be protected from this disease. The vaccine is usually given as 2 doses, 6 to 18 months apart.  Hepatitis B. You need this vaccine if you have a specific risk factor for hepatitis B virus infection or you simply wish to be protected from this disease. The vaccine is given in 3 doses, usually over 6 months. Preventive Services / Frequency Ages 19 to 39  Blood  pressure check.** / Every 1 to 2 years.  Lipid and cholesterol check.** / Every 5 years beginning at age 20.  Clinical breast exam.** / Every 3 years for women in their 20s and 30s.  Pap test.** / Every 2 years from ages 21 through 29. Every 3 years starting at age 30 through age 65 or 70 with a history of 3 consecutive normal Pap tests.  HPV screening.** / Every 3 years from ages 30 through ages 65 to 70 with a history of 3 consecutive normal Pap tests.  Hepatitis C blood test.** / For any individual with known risks for hepatitis C.  Skin self-exam. / Monthly.  Influenza immunization.** / Every year.  Pneumococcal polysaccharide immunization.** / 1 to 2 doses if you smoke cigarettes or if you have certain chronic medical conditions.  Tetanus, diphtheria, pertussis (Tdap, Td) immunization. / A one-time dose of Tdap vaccine. After that, you need a Td booster dose every 10 years.  HPV immunization. / 3 doses over 6 months, if you are 26 and younger.  Measles, mumps, rubella (MMR) immunization. / You need at least 1 dose of MMR if you were born in 1957 or later. You may also need a second dose.  Meningococcal immunization. / 1 dose if you are age 19 to 21 and a first-year college student living in a residence hall, or have one of several medical conditions, you need to get vaccinated against meningococcal disease. You may also need additional booster doses.  Varicella immunization.** / Consult your caregiver.  Hepatitis A immunization.** / Consult your caregiver. 2 doses, 6 to 18 months apart.  Hepatitis B immunization.** / Consult your caregiver. 3 doses usually over 6 months. Ages 40 to 64  Blood pressure check.** / Every 1 to 2 years.  Lipid and cholesterol check.** / Every 5 years beginning at age 20.  Clinical breast exam.** / Every year after age 40.  Mammogram.** / Every year beginning at age 40   and continuing for as long as you are in good health. Consult with your  caregiver.  Pap test.** / Every 3 years starting at age 30 through age 65 or 70 with a history of 3 consecutive normal Pap tests.  HPV screening.** / Every 3 years from ages 30 through ages 65 to 70 with a history of 3 consecutive normal Pap tests.  Fecal occult blood test (FOBT) of stool. / Every year beginning at age 50 and continuing until age 75. You may not need to do this test if you get a colonoscopy every 10 years.  Flexible sigmoidoscopy or colonoscopy.** / Every 5 years for a flexible sigmoidoscopy or every 10 years for a colonoscopy beginning at age 50 and continuing until age 75.  Hepatitis C blood test.** / For all people born from 1945 through 1965 and any individual with known risks for hepatitis C.  Skin self-exam. / Monthly.  Influenza immunization.** / Every year.  Pneumococcal polysaccharide immunization.** / 1 to 2 doses if you smoke cigarettes or if you have certain chronic medical conditions.  Tetanus, diphtheria, pertussis (Tdap, Td) immunization.** / A one-time dose of Tdap vaccine. After that, you need a Td booster dose every 10 years.  Measles, mumps, rubella (MMR) immunization. / You need at least 1 dose of MMR if you were born in 1957 or later. You may also need a second dose.  Varicella immunization.** / Consult your caregiver.  Meningococcal immunization.** / Consult your caregiver.  Hepatitis A immunization.** / Consult your caregiver. 2 doses, 6 to 18 months apart.  Hepatitis B immunization.** / Consult your caregiver. 3 doses, usually over 6 months. Ages 65 and over  Blood pressure check.** / Every 1 to 2 years.  Lipid and cholesterol check.** / Every 5 years beginning at age 20.  Clinical breast exam.** / Every year after age 40.  Mammogram.** / Every year beginning at age 40 and continuing for as long as you are in good health. Consult with your caregiver.  Pap test.** / Every 3 years starting at age 30 through age 65 or 70 with a 3  consecutive normal Pap tests. Testing can be stopped between 65 and 70 with 3 consecutive normal Pap tests and no abnormal Pap or HPV tests in the past 10 years.  HPV screening.** / Every 3 years from ages 30 through ages 65 or 70 with a history of 3 consecutive normal Pap tests. Testing can be stopped between 65 and 70 with 3 consecutive normal Pap tests and no abnormal Pap or HPV tests in the past 10 years.  Fecal occult blood test (FOBT) of stool. / Every year beginning at age 50 and continuing until age 75. You may not need to do this test if you get a colonoscopy every 10 years.  Flexible sigmoidoscopy or colonoscopy.** / Every 5 years for a flexible sigmoidoscopy or every 10 years for a colonoscopy beginning at age 50 and continuing until age 75.  Hepatitis C blood test.** / For all people born from 1945 through 1965 and any individual with known risks for hepatitis C.  Osteoporosis screening.** / A one-time screening for women ages 65 and over and women at risk for fractures or osteoporosis.  Skin self-exam. / Monthly.  Influenza immunization.** / Every year.  Pneumococcal polysaccharide immunization.** / 1 dose at age 65 (or older) if you have never been vaccinated.  Tetanus, diphtheria, pertussis (Tdap, Td) immunization. / A one-time dose of Tdap vaccine if you are over   65 and have contact with an infant, are a healthcare worker, or simply want to be protected from whooping cough. After that, you need a Td booster dose every 10 years.  Varicella immunization.** / Consult your caregiver.  Meningococcal immunization.** / Consult your caregiver.  Hepatitis A immunization.** / Consult your caregiver. 2 doses, 6 to 18 months apart.  Hepatitis B immunization.** / Check with your caregiver. 3 doses, usually over 6 months. ** Family history and personal history of risk and conditions may change your caregiver's recommendations. Document Released: 01/10/2002 Document Revised: 02/06/2012  Document Reviewed: 04/11/2011 [employer] Patient Information 2013 [employer].  

## 2013-02-20 NOTE — Assessment & Plan Note (Signed)
Stable con't meds 

## 2013-02-20 NOTE — Assessment & Plan Note (Signed)
Check labs con't meds 

## 2013-02-20 NOTE — Progress Notes (Signed)
Subjective:    Evelyn [name] is a 79 y.o. female who presents for Medicare Annual/Subsequent preventive examination.  Preventive Screening-Counseling & Management  Tobacco History  Smoking status  . Former Smoker -- 0.75 packs/day for 18 years  . Types: Cigarettes  . Quit date: 02/20/1973  Smokeless tobacco  . Never Used     Problems Prior to Visit 1.   Current Problems (verified) Patient Active Problem List  Diagnosis  . PLANTAR WART, LEFT  . MOLE  . HYPERLIPIDEMIA  . GLAUCOMA, LEFT EYE  . HYPERTENSION  . VARICOSE VEINS, LOWER EXTREMITIES  . ARTIFICIAL MENOPAUSE  . HIP PAIN, RIGHT  . Pain in Soft Tissues of Limb  . OSTEOPOROSIS  . CARDIAC MURMUR  . BREAST BIOPSY, HX OF  . Pain in joint, lower leg    Medications Prior to Visit Current Outpatient Prescriptions on File Prior to Visit  Medication Sig Dispense Refill  . amLODipine (NORVASC) 5 MG tablet Take 2.5 mg by mouth daily.      Marland Kitchen aspirin 81 MG tablet Take 81 mg by mouth daily.        . calcium carbonate (OS-CAL) 600 MG TABS Take 600 mg by mouth daily.        . fish oil-omega-3 fatty acids 1000 MG capsule Take 1 g by mouth daily.        Marland Kitchen latanoprost (XALATAN) 0.005 % ophthalmic solution Place 1 drop into both eyes at bedtime.        Marland Kitchen lisinopril-hydrochlorothiazide (PRINZIDE,ZESTORETIC) 20-25 MG per tablet Take 1 tablet by mouth daily.  30 tablet  2  . Multiple Vitamin (MULTIVITAMIN) tablet Take 1 tablet by mouth daily.        . Red Yeast Rice 600 MG TABS Take 1 tablet by mouth daily.        Marland Kitchen estradiol (ESTRACE) 0.5 MG tablet Take 1 tablet (0.5 mg total) by mouth daily.  30 tablet  11  . mometasone (NASONEX) 50 MCG/ACT nasal spray Place 2 sprays into the nose daily.  17 g  2   No current facility-administered medications on file prior to visit.    Current Medications (verified) Current Outpatient Prescriptions  Medication Sig Dispense Refill  . amLODipine (NORVASC) 5 MG tablet Take 2.5 mg by mouth daily.       Marland Kitchen aspirin 81 MG tablet Take 81 mg by mouth daily.        . calcium carbonate (OS-CAL) 600 MG TABS Take 600 mg by mouth daily.        . fish oil-omega-3 fatty acids 1000 MG capsule Take 1 g by mouth daily.        Marland Kitchen latanoprost (XALATAN) 0.005 % ophthalmic solution Place 1 drop into both eyes at bedtime.        Marland Kitchen lisinopril-hydrochlorothiazide (PRINZIDE,ZESTORETIC) 20-25 MG per tablet Take 1 tablet by mouth daily.  30 tablet  2  . metoprolol succinate (TOPROL-XL) 50 MG 24 hr tablet 1/2 tab po qd.  Take with or immediately following a meal.  45 tablet  3  . Multiple Vitamin (MULTIVITAMIN) tablet Take 1 tablet by mouth daily.        . Red Yeast Rice 600 MG TABS Take 1 tablet by mouth daily.        Marland Kitchen estradiol (ESTRACE) 0.5 MG tablet Take 1 tablet (0.5 mg total) by mouth daily.  30 tablet  11  . mometasone (NASONEX) 50 MCG/ACT nasal spray Place 2 sprays into the nose daily.  17  g  2   No current facility-administered medications for this visit.     Allergies (verified) Vicodin   PAST HISTORY  Family History Family History  Problem Relation Age of Onset  . Arthritis Mother   . Heart disease Mother   . Heart attack Father 79  . Prostate cancer Father   . COPD Father   . Glaucoma Father   . Hypertension Sister   . COPD Sister   . Arthritis Sister     Social History History  Substance Use Topics  . Smoking status: Former Smoker -- 0.75 packs/day for 18 years    Types: Cigarettes    Quit date: 02/20/1973  . Smokeless tobacco: Never Used  . Alcohol Use: Yes     Are there smokers in your home (other than you)? No  Risk Factors Current exercise habits: The patient does not participate in regular exercise at present.  Dietary issues discussed: no   Cardiac risk factors: advanced age (older than 78 for men, 45 for women), dyslipidemia, hypertension and sedentary lifestyle.  Depression Screen (Note: if answer to either of the following is "Yes", a more complete depression  screening is indicated)   Over the past two weeks, have you felt down, depressed or hopeless? No  Over the past two weeks, have you felt little interest or pleasure in doing things? No  Have you lost interest or pleasure in daily life? No  Do you often feel hopeless? No  Do you cry easily over simple problems? No  Activities of Daily Living In your present state of health, do you have any difficulty performing the following activities?:  Driving? No Managing money?  No Feeding yourself? No Getting from bed to chair? No Climbing a flight of stairs? No Preparing food and eating?: No Bathing or showering? No Getting dressed: No Getting to the toilet? No Using the toilet:No Moving around from place to place: No In the past year have you fallen or had a near fall?:No   Are you sexually active?  No  Do you have more than one partner?  No  Hearing Difficulties: No Do you often ask people to speak up or repeat themselves? No Do you experience ringing or noises in your ears? No Do you have difficulty understanding soft or whispered voices? No   Do you feel that you have a problem with memory? No  Do you often misplace items? No  Do you feel safe at home?  Yes  Cognitive Testing  Alert? Yes  Normal Appearance?Yes  Oriented to person? Yes  Place? Yes   Time? Yes  Recall of three objects?  Yes  Can perform simple calculations? Yes  Displays appropriate judgment?Yes  Can read the correct time from a watch face?Yes   Advanced Directives have been discussed with the patient? Yes  List the Names of Other Physician/Practitioners you currently use: 1.  opth-- [facility]/ [facility] 2.  Dentist--  Curley Spice any recent Medical Services you may have received from other than [facility] providers in the past year (date may be approximate).  Immunization History  Administered Date(s) Administered  . Influenza Split 09/15/2011, 09/20/2012  . Influenza Whole 11/28/2000, 10/10/2007, 08/20/2008,  09/24/2009, 08/25/2010  . Pneumococcal Polysaccharide 11/28/2000  . Td 11/28/2000  . Tdap 08/05/2011  . Zoster 12/28/2007    Screening Tests Health Maintenance  Topic Date Due  . Colonoscopy  12/06/2010  . Influenza Vaccine  07/29/2013  . Tetanus/tdap  08/04/2021  . Pneumococcal Polysaccharide Vaccine Age  65 And Over  Completed  . Zostavax  Completed    All answers were reviewed with the patient and necessary referrals were made:  Loreen Freud, DO   02/20/2013   History reviewed:  She  has a past medical history of Hypertension; Osteoporosis; Hyperlipidemia; and Post-menopausal. She  does not have any pertinent problems on file. She  has past surgical history that includes Abdominal hysterectomy and Breast lumpectomy (1975). Her family history includes Arthritis in her mother and sister; COPD in her father and sister; Glaucoma in her father; Heart attack (age of onset: 77) in her father; Heart disease in her mother; Hypertension in her sister; and Prostate cancer in her father. She  reports that she quit smoking about 40 years ago. Her smoking use included Cigarettes. She has a 13.5 pack-year smoking history. She has never used smokeless tobacco. She reports that  drinks alcohol. She reports that she does not use illicit drugs. She has a current medication list which includes the following prescription(s): amlodipine, aspirin, calcium carbonate, fish oil-omega-3 fatty acids, latanoprost, lisinopril-hydrochlorothiazide, metoprolol succinate, multivitamin, red yeast rice, estradiol, and mometasone. Current Outpatient Prescriptions on File Prior to Visit  Medication Sig Dispense Refill  . amLODipine (NORVASC) 5 MG tablet Take 2.5 mg by mouth daily.      Marland Kitchen aspirin 81 MG tablet Take 81 mg by mouth daily.        . calcium carbonate (OS-CAL) 600 MG TABS Take 600 mg by mouth daily.        . fish oil-omega-3 fatty acids 1000 MG capsule Take 1 g by mouth daily.        Marland Kitchen latanoprost (XALATAN)  0.005 % ophthalmic solution Place 1 drop into both eyes at bedtime.        Marland Kitchen lisinopril-hydrochlorothiazide (PRINZIDE,ZESTORETIC) 20-25 MG per tablet Take 1 tablet by mouth daily.  30 tablet  2  . Multiple Vitamin (MULTIVITAMIN) tablet Take 1 tablet by mouth daily.        . Red Yeast Rice 600 MG TABS Take 1 tablet by mouth daily.        Marland Kitchen estradiol (ESTRACE) 0.5 MG tablet Take 1 tablet (0.5 mg total) by mouth daily.  30 tablet  11  . mometasone (NASONEX) 50 MCG/ACT nasal spray Place 2 sprays into the nose daily.  17 g  2   No current facility-administered medications on file prior to visit.   She is allergic to vicodin.  Review of Systems  Review of Systems  Constitutional: Negative for activity change, appetite change and fatigue.  HENT: Negative for hearing loss, congestion, tinnitus and ear discharge.   Eyes: Negative for visual disturbance (see optho q1y -- vision corrected to 20/20 with glasses).  Respiratory: Negative for cough, chest tightness and shortness of breath.   Cardiovascular: Negative for chest pain, palpitations and leg swelling.  Gastrointestinal: Negative for abdominal pain, diarrhea, constipation and abdominal distention.  Genitourinary: Negative for urgency, frequency, decreased urine volume and difficulty urinating.  Musculoskeletal: Negative for back pain, arthralgias and gait problem.  Skin: Negative for color change, pallor and rash.  Neurological: Negative for dizziness, light-headedness, numbness and headaches.  Hematological: Negative for adenopathy. Does not bruise/bleed easily.  Psychiatric/Behavioral: Negative for suicidal ideas, confusion, sleep disturbance, self-injury, dysphoric mood, decreased concentration and agitation.  Pt is able to read and write and can do all ADLs No risk for falling No abuse/ violence in home     Objective:     Vision by Snellen chart: opth  Body mass index is 25.71 kg/(m^2). BP 110/72  Pulse 54  Temp(Src) 98.2 F  (36.8 C) (Oral)  Ht 5\' 1"  (1.549 m)  Wt 136 lb (61.689 kg)  BMI 25.71 kg/m2  SpO2 98%  BP 110/72  Pulse 54  Temp(Src) 98.2 F (36.8 C) (Oral)  Ht 5\' 1"  (1.549 m)  Wt 136 lb (61.689 kg)  BMI 25.71 kg/m2  SpO2 98% General appearance: alert, cooperative, appears stated age and no distress Head: Normocephalic, without obvious abnormality, atraumatic Eyes: conjunctivae/corneas clear. PERRL, EOM&#39;s intact. Fundi benign. Ears: normal TM&#39;s and external ear canals both ears Nose: Nares normal. Septum midline. Mucosa normal. No drainage or sinus tenderness. Throat: lips, mucosa, and tongue normal; teeth and gums normal Neck: no adenopathy, no carotid bruit, no JVD, supple, symmetrical, trachea midline and thyroid not enlarged, symmetric, no tenderness/mass/nodules Back: symmetric, no curvature. ROM normal. No CVA tenderness. Lungs: clear to auscultation bilaterally Breasts: normal appearance, no masses or tenderness Heart: regular rate and rhythm, S1, S2 normal, no murmur, click, rub or gallop Abdomen: soft, non-tender; bowel sounds normal; no masses,  no organomegaly Pelvic: not indicated; status post hysterectomy, negative ROS Extremities: extremities normal, atraumatic, no cyanosis or edema Pulses: 2+ and symmetric Skin: Skin color, texture, turgor normal. No rashes or lesions Lymph nodes: Cervical, supraclavicular, and axillary nodes normal. Neurologic: Alert and oriented X 3, normal strength and tone. Normal symmetric reflexes. Normal coordination and gait Psych-- no depression, no anxiety      Assessment:     cpe     Plan:     During the course of the visit the patient was educated and counseled about appropriate screening and preventive services including:    Pneumococcal vaccine   Influenza vaccine  Screening electrocardiogram  Screening mammography  Bone densitometry screening  Colorectal cancer screening  Glaucoma screening  Advanced directives:  has an advanced directive - a copy HAS NOT been provided.  Diet review for nutrition referral? Yes ____  Not Indicated __x__   Patient Instructions (the written plan) was given to the patient.  Medicare Attestation I have personally reviewed: The patient's medical and social history Their use of alcohol, tobacco or illicit drugs Their current medications and supplements The patient's functional ability including ADLs,fall risks, home safety risks, cognitive, and hearing and visual impairment Diet and physical activities Evidence for depression or mood disorders  The patient's weight, height, BMI, and visual acuity have been recorded in the chart.  I have made referrals, counseling, and provided education to the patient based on review of the above and I have provided the patient with a written personalized care plan for preventive services.     Loreen Freud, DO   02/20/2013

## 2013-02-20 NOTE — Assessment & Plan Note (Signed)
prolia to be done next month

## 2013-02-25 ENCOUNTER — Encounter (INDEPENDENT_AMBULATORY_CARE_PROVIDER_SITE_OTHER): Payer: Medicare Other

## 2013-02-25 DIAGNOSIS — I70219 Atherosclerosis of native arteries of extremities with intermittent claudication, unspecified extremity: Secondary | ICD-10-CM

## 2013-02-25 DIAGNOSIS — I739 Peripheral vascular disease, unspecified: Secondary | ICD-10-CM

## 2013-02-27 ENCOUNTER — Other Ambulatory Visit: Payer: Self-pay | Admitting: Family Medicine

## 2013-02-27 ENCOUNTER — Telehealth: Payer: Self-pay

## 2013-02-27 DIAGNOSIS — I739 Peripheral vascular disease, unspecified: Secondary | ICD-10-CM

## 2013-02-27 NOTE — Telephone Encounter (Signed)
Message copied by Arnette Norris on Wed Feb 27, 2013  5:34 PM ------      Message from: Lelon Perla      Created: Wed Feb 27, 2013  4:32 PM       > 50% stenosis----refer to cards for PVD/ claudication ------

## 2013-02-27 NOTE — Telephone Encounter (Signed)
Msg left to call the office     KP 

## 2013-02-28 ENCOUNTER — Ambulatory Visit: Payer: Medicare Other | Admitting: Family Medicine

## 2013-03-01 NOTE — Telephone Encounter (Signed)
msg left to call the office     KP 

## 2013-03-01 NOTE — Telephone Encounter (Signed)
Renee made the patient aware of results when she called to scheduled the Cardiology apt.    KP

## 2013-03-05 ENCOUNTER — Ambulatory Visit (INDEPENDENT_AMBULATORY_CARE_PROVIDER_SITE_OTHER): Payer: Medicare Other | Admitting: Cardiovascular Disease

## 2013-03-05 ENCOUNTER — Encounter: Payer: Self-pay | Admitting: Cardiovascular Disease

## 2013-03-05 VITALS — BP 139/71 | HR 62 | Ht 61.0 in | Wt 137.1 lb

## 2013-03-05 DIAGNOSIS — I739 Peripheral vascular disease, unspecified: Secondary | ICD-10-CM

## 2013-03-05 MED ORDER — CILOSTAZOL 50 MG PO TABS: 50.0000 mg | ORAL_TABLET | Freq: Two times a day (BID) | ORAL | Status: DC

## 2013-03-05 NOTE — Patient Instructions (Addendum)
Your physician has recommended you make the following change in your medication:   Start Pletal 50mg   1 tablet by mouth twices a day.  Your physician wants you to follow-up in: 3 mths with Dr. Kirke Corin, You will receive a reminder letter in the mail two months in advance. If you don't receive a letter, please call our office to schedule the follow-up appointment.

## 2013-03-07 ENCOUNTER — Encounter: Payer: Self-pay | Admitting: Cardiovascular Disease

## 2013-03-07 DIAGNOSIS — I739 Peripheral vascular disease, unspecified: Secondary | ICD-10-CM | POA: Insufficient documentation

## 2013-03-07 NOTE — Progress Notes (Signed)
HPI  This is an 79 year old female who was referred by Dr. Laury Axon for evaluation and management of claudication and peripheral arterial disease. Patient has no previous cardiac history. She has no history of diabetes. She is a previous smoker. She has known history of hypertension and hyperlipidemia. Overall she's been healthy and active. Over the last 1 year, she has noticed bilateral calf discomfort after walking about 100 feet. The discomfort is usually mild but does force her to stop for a few minutes before she can resume. There has been no rest pain no lower extremity ulceration. She denies any chest pain or dyspnea.  Allergies  Allergen Reactions  . Vicodin (Hydrocodone-Acetaminophen) Other (See Comments)    Abdominal Pain     Current Outpatient Prescriptions on File Prior to Visit  Medication Sig Dispense Refill  . amLODipine (NORVASC) 5 MG tablet Take 0.5 tablets (2.5 mg total) by mouth daily.  45 tablet  3  . aspirin 81 MG tablet Take 81 mg by mouth daily.        . calcium carbonate (OS-CAL) 600 MG TABS Take 600 mg by mouth daily.        . fish oil-omega-3 fatty acids 1000 MG capsule Take 1 g by mouth daily.        Marland Kitchen latanoprost (XALATAN) 0.005 % ophthalmic solution Place 1 drop into both eyes at bedtime.        Marland Kitchen lisinopril-hydrochlorothiazide (PRINZIDE,ZESTORETIC) 20-25 MG per tablet Take 1 tablet by mouth daily.  90 tablet  3  . metoprolol succinate (TOPROL-XL) 50 MG 24 hr tablet 1/2 tab po qd.  Take with or immediately following a meal.  45 tablet  3  . Multiple Vitamin (MULTIVITAMIN) tablet Take 1 tablet by mouth daily.        . Red Yeast Rice 600 MG TABS Take 1 tablet by mouth daily.         No current facility-administered medications on file prior to visit.     Past Medical History  Diagnosis Date  . Hypertension   . Osteoporosis   . Hyperlipidemia   . Post-menopausal     HRT     Past Surgical History  Procedure Laterality Date  . Abdominal hysterectomy      . Breast lumpectomy  1975     Family History  Problem Relation Age of Onset  . Arthritis Mother   . Heart disease Mother   . Heart attack Father 66  . Prostate cancer Father   . COPD Father   . Glaucoma Father   . Hypertension Sister   . COPD Sister   . Arthritis Sister      History   Social History  . Marital Status: Widowed    Spouse Name: N/A    Number of Children: N/A  . Years of Education: N/A   Occupational History  . Not on file.   Social History Main Topics  . Smoking status: Former Smoker -- 0.75 packs/day for 18 years    Types: Cigarettes    Quit date: 02/20/1973  . Smokeless tobacco: Never Used  . Alcohol Use: Yes  . Drug Use: No  . Sexually Active: Yes -- Female partner(s)   Other Topics Concern  . Not on file   Social History Narrative   Exercise--no     ROS Constitutional: Negative for fever, chills, diaphoresis, activity change, appetite change and fatigue.  HENT: Negative for hearing loss, nosebleeds, congestion, sore throat, facial swelling, drooling, trouble swallowing, neck  pain, voice change, sinus pressure and tinnitus.  Eyes: Negative for photophobia, pain, discharge and visual disturbance.  Respiratory: Negative for apnea, cough, chest tightness, shortness of breath and wheezing.  Cardiovascular: Negative for chest pain, palpitations and leg swelling.  Gastrointestinal: Negative for nausea, vomiting, abdominal pain, diarrhea, constipation, blood in stool and abdominal distention.  Genitourinary: Negative for dysuria, urgency, frequency, hematuria and decreased urine volume.  Skin: Negative for color change, pallor, rash and wound.  Neurological: Negative for dizziness, tremors, seizures, syncope, speech difficulty, weakness, light-headedness, numbness and headaches.  Psychiatric/Behavioral: Negative for suicidal ideas, hallucinations, behavioral problems and agitation. The patient is not nervous/anxious.     PHYSICAL EXAM   BP  139/71  Pulse 62  Ht 5\' 1"  (1.549 m)  Wt 137 lb 1.9 oz (62.197 kg)  BMI 25.92 kg/m2 Constitutional: She is oriented to person, place, and time. She appears well-developed and well-nourished. No distress.  HENT: No nasal discharge.  Head: Normocephalic and atraumatic.  Eyes: Pupils are equal and round. Right eye exhibits no discharge. Left eye exhibits no discharge.  Neck: Normal range of motion. Neck supple. No JVD present. No thyromegaly present.  Cardiovascular: Normal rate, regular rhythm, normal heart sounds. Exam reveals no gallop and no friction rub. No murmur heard.  Pulmonary/Chest: Effort normal and breath sounds normal. No stridor. No respiratory distress. She has no wheezes. She has no rales. She exhibits no tenderness.  Abdominal: Soft. Bowel sounds are normal. She exhibits no distension. There is no tenderness. There is no rebound and no guarding.  Musculoskeletal: Normal range of motion. She exhibits no edema and no tenderness.  Neurological: She is alert and oriented to person, place, and time. Coordination normal.  Skin: Skin is warm and dry. No rash noted. She is not diaphoretic. No erythema. No pallor.  Psychiatric: She has a normal mood and affect. Her behavior is normal. Judgment and thought content normal.  Vascular: Femoral pulses normal bilaterally. DP: Normal on the right side and diminished on the left side. PT: Absent bilaterally.   ASSESSMENT AND PLAN

## 2013-03-07 NOTE — Assessment & Plan Note (Signed)
The patient has evidence of mild peripheral arterial disease with non-lifestyle limiting claudication involving both calves. Her ABI was slightly reduced bilaterally. Duplex imaging showed high-grade stenosis in the left SFA and diffuse disease in the right SFA. Given that her symptoms are mild, I recommend medical therapy. I will initiate treatment with Pletal 50 mg twice daily. I also advised her to start a walking program. She endorses no symptoms suggestive of cardiac disease. Her blood pressure has been reasonably controlled. Continue aspirin 81 mg daily. I will have a followup in 3 months for reevaluation.

## 2013-03-26 ENCOUNTER — Other Ambulatory Visit (INDEPENDENT_AMBULATORY_CARE_PROVIDER_SITE_OTHER): Payer: Medicare Other

## 2013-03-26 ENCOUNTER — Ambulatory Visit (INDEPENDENT_AMBULATORY_CARE_PROVIDER_SITE_OTHER): Payer: Medicare Other | Admitting: *Deleted

## 2013-03-26 DIAGNOSIS — E785 Hyperlipidemia, unspecified: Secondary | ICD-10-CM

## 2013-03-26 DIAGNOSIS — M810 Age-related osteoporosis without current pathological fracture: Secondary | ICD-10-CM

## 2013-03-26 LAB — LIPID PANEL
Cholesterol: 210 mg/dL — ABNORMAL HIGH (ref 0–200)
HDL: 70.3 mg/dL (ref >39.00)
VLDL: 18.8 mg/dL (ref 0.0–40.0)

## 2013-03-26 MED ORDER — DENOSUMAB 60 MG/ML ~~LOC~~ SOLN
60.0000 mg | Freq: Once | SUBCUTANEOUS | Status: AC
  Administered 2013-03-26: 60 mg via SUBCUTANEOUS

## 2013-04-26 ENCOUNTER — Telehealth: Payer: Self-pay

## 2013-04-26 NOTE — Telephone Encounter (Signed)
Error.     KP 

## 2013-04-28 ENCOUNTER — Other Ambulatory Visit: Payer: Self-pay | Admitting: Family Medicine

## 2013-05-02 ENCOUNTER — Telehealth: Payer: Self-pay

## 2013-05-02 NOTE — Telephone Encounter (Signed)
Detailed message left on VM to stop the Norvasc and call back so we can schedule a follow up.     KP

## 2013-05-02 NOTE — Telephone Encounter (Signed)
Call from patient who stated her BP is running low and she has bene feeling tired.They are higher in the morning and have been ranging  From 104/54, 102/49, and 97/45 pulse is between 55-60. She stated she is on 3 BP med's and would like to see if you can adjust something because she is not feeling like herself. She is not having and CP, no syncope, no blurred vision, eating well but some SOB. Please advise     KP

## 2013-05-02 NOTE — Telephone Encounter (Signed)
Received VM from patient advising that she received my msg and she will d/c the medication and follow up      KP

## 2013-05-02 NOTE — Telephone Encounter (Signed)
Stop norvasc Ov in 2-3 weeks

## 2013-05-23 ENCOUNTER — Ambulatory Visit (INDEPENDENT_AMBULATORY_CARE_PROVIDER_SITE_OTHER): Payer: Medicare Other | Admitting: Family Medicine

## 2013-05-23 ENCOUNTER — Telehealth: Payer: Self-pay | Admitting: *Deleted

## 2013-05-23 VITALS — BP 110/62 | HR 67 | Temp 98.4°F | Wt 134.0 lb

## 2013-05-23 DIAGNOSIS — M722 Plantar fascial fibromatosis: Secondary | ICD-10-CM

## 2013-05-23 DIAGNOSIS — I10 Essential (primary) hypertension: Secondary | ICD-10-CM

## 2013-05-23 MED ORDER — LISINOPRIL-HYDROCHLOROTHIAZIDE 10-12.5 MG PO TABS: 1.0000 | ORAL_TABLET | Freq: Every day | ORAL | Status: DC

## 2013-05-23 NOTE — Telephone Encounter (Signed)
Break lisinopril  hct in half (10/12.5)

## 2013-05-23 NOTE — Telephone Encounter (Signed)
Pt call back stating that she is already not taking the norvasc which Dr Laury Axon indicated for her to stop. Pt is currently on lisinopril and metoprolol.Please advise if another change is needed.

## 2013-05-23 NOTE — Patient Instructions (Addendum)
Stop the amolodapine con't other meds and con't to check bp   Plantar Fasciitis Plantar fasciitis is a common condition that causes foot pain. It is soreness (inflammation) of the band of tough fibrous tissue on the bottom of the foot that runs from the heel bone (calcaneus) to the ball of the foot. The cause of this soreness may be from excessive standing, poor fitting shoes, running on hard surfaces, being overweight, having an abnormal walk, or overuse (this is common in runners) of the painful foot or feet. It is also common in aerobic exercise dancers and ballet dancers. SYMPTOMS  Most people with plantar fasciitis complain of:  Severe pain in the morning on the bottom of their foot especially when taking the first steps out of bed. This pain recedes after a few minutes of walking.  Severe pain is experienced also during walking following a long period of inactivity.  Pain is worse when walking barefoot or up stairs DIAGNOSIS   Your caregiver will diagnose this condition by examining and feeling your foot.  Special tests such as X-rays of your foot, are usually not needed. PREVENTION   Consult a sports medicine professional before beginning a new exercise program.  Walking programs offer a good workout. With walking there is a lower chance of overuse injuries common to runners. There is less impact and less jarring of the joints.  Begin all new exercise programs slowly. If problems or pain develop, decrease the amount of time or distance until you are at a comfortable level.  Wear good shoes and replace them regularly.  Stretch your foot and the heel cords at the back of the ankle (Achilles tendon) both before and after exercise.  Run or exercise on even surfaces that are not hard. For example, asphalt is better than pavement.  Do not run barefoot on hard surfaces.  If using a treadmill, vary the incline.  Do not continue to workout if you have foot or joint problems. Seek  professional help if they do not improve. HOME CARE INSTRUCTIONS   Avoid activities that cause you pain until you recover.  Use ice or cold packs on the problem or painful areas after working out.  Only take over-the-counter or prescription medicines for pain, discomfort, or fever as directed by your caregiver.  Soft shoe inserts or athletic shoes with air or gel sole cushions may be helpful.  If problems continue or become more severe, consult a sports medicine caregiver or your own health care provider. Cortisone is a potent anti-inflammatory medication that may be injected into the painful area. You can discuss this treatment with your caregiver. MAKE SURE YOU:   Understand these instructions.  Will watch your condition.  Will get help right away if you are not doing well or get worse. Document Released: 08/09/2001 Document Revised: 02/06/2012 Document Reviewed: 10/08/2008 [facility] Patient Information 2014 [place], [place].

## 2013-05-24 NOTE — Telephone Encounter (Signed)
Called patient but the line was busy.     KP

## 2013-05-24 NOTE — Telephone Encounter (Signed)
Line busy.   KP 

## 2013-05-25 ENCOUNTER — Encounter: Payer: Self-pay | Admitting: Family Medicine

## 2013-05-25 DIAGNOSIS — M722 Plantar fascial fibromatosis: Secondary | ICD-10-CM | POA: Insufficient documentation

## 2013-05-25 NOTE — Progress Notes (Signed)
  Subjective:    Patient here for follow-up of elevated blood pressure.  She is not exercising and is adherent to a low-salt diet.  Blood pressure is well controlled at home--actually low.. Cardiac symptoms: none. Patient denies: chest pain, chest pressure/discomfort, claudication, dyspnea, exertional chest pressure/discomfort, fatigue, irregular heart beat, lower extremity edema, near-syncope, orthopnea, palpitations, paroxysmal nocturnal dyspnea, syncope and tachypnea. Cardiovascular risk factors: advanced age (older than 23 for men, 94 for women), dyslipidemia and hypertension. Use of agents associated with hypertension: none. History of target organ damage: none.  The following portions of the patient's history were reviewed and updated as appropriate: allergies, current medications, past medical history, past social history, past surgical history and problem list.  Review of Systems Pertinent items are noted in HPI.     Objective:    BP 110/62  Pulse 67  Temp(Src) 98.4 F (36.9 C) (Oral)  Wt 134 lb (60.782 kg)  BMI 25.33 kg/m2  SpO2 97% General appearance: alert, cooperative and no distress Neck: no adenopathy, supple, symmetrical, trachea midline and thyroid not enlarged, symmetric, no tenderness/mass/nodules Lungs: clear to auscultation bilaterally Heart: S1, S2 normal and + murmur Extremities: extremities normal, atraumatic, no cyanosis or edema    Assessment:    Hypertension, low bp . Evidence of target organ damage: peripheral artery disease.    Plan:    Medication: decrease to lisinopril 10/12.5. Dietary sodium restriction. Regular aerobic exercise. Check blood pressures 2-3 times daily and record. Follow up: 3 weeks and as needed.

## 2013-05-25 NOTE — Assessment & Plan Note (Signed)
Gel inserts Stretch explained to pt Consider podiatry referral

## 2013-05-25 NOTE — Assessment & Plan Note (Signed)
Running low-- cut lisinopril in half Pt will drop off bp readings in 2-3 weeks

## 2013-05-28 NOTE — Telephone Encounter (Signed)
msg left to call back.   KP 

## 2013-05-29 MED ORDER — LISINOPRIL 10 MG PO TABS: 10.0000 mg | ORAL_TABLET | Freq: Every day | ORAL | Status: DC

## 2013-05-29 NOTE — Telephone Encounter (Signed)
Discussed with patient and she voiced understanding, she is already doing 1/2 of the Lisinopril and 1/2 of the Metoprolol. He BP's are still a little low 97/49, 93/49, pulse is running about 60. Discussed with Dr.Tabori who advises the patient to stopped the Lisinopril 10-12.5 and start regular Lisinopril 10 mg and follow up with Dr.Lowne in a few days. Discussed with patient and she voiced understanding, she will stop Lisinopril 10-12.5 and start lisinopril 10 and follow up [place], she also agred to continue to check her BP's and bring in her BP cuff when she comes in.    [place]

## 2013-06-14 ENCOUNTER — Encounter: Payer: Self-pay | Admitting: Family Medicine

## 2013-06-14 ENCOUNTER — Ambulatory Visit (INDEPENDENT_AMBULATORY_CARE_PROVIDER_SITE_OTHER): Payer: Medicare Other | Admitting: Family Medicine

## 2013-06-14 VITALS — BP 160/106 | HR 61 | Temp 99.1°F | Wt 135.4 lb

## 2013-06-14 DIAGNOSIS — I10 Essential (primary) hypertension: Secondary | ICD-10-CM

## 2013-06-14 NOTE — Patient Instructions (Signed)
Stop lisinopril---- Your bp at home are running low Your bp can be rechecked with cardiology Tuesday. Call us if you have any concerns Have  Great weekend!!

## 2013-06-14 NOTE — Progress Notes (Signed)
  Subjective:    Patient ID: Evelyn [name], female    DOB: 1930-03-07, 79 y.o.   MRN: [id number]  HPI Pt here c/o bp running low at home.  It is better since cutting lisinopril dose in half but still low.  Much lower at home than in office and we checked her cuff against ours and her cuff ran much higher.      Review of Systems As above    Objective:   Physical Exam BP 160/106  Pulse 61  Temp(Src) 99.1 F (37.3 C) (Oral)  Wt 135 lb 6.4 oz (61.417 kg)  BMI 25.6 kg/m2  SpO2 97% General appearance: alert, cooperative, appears stated age and no distress Neck: no adenopathy, no carotid bruit, no JVD, supple, symmetrical, trachea midline and thyroid not enlarged, symmetric, no tenderness/mass/nodules Lungs: clear to auscultation bilaterally Heart: S1, S2 normal Extremities: extremities normal, atraumatic, no cyanosis or edema       Assessment & Plan:

## 2013-06-14 NOTE — Assessment & Plan Note (Signed)
Stop lisinopril 5 mg con't other meds Pt has ov with cards on Tues

## 2013-06-17 ENCOUNTER — Ambulatory Visit (INDEPENDENT_AMBULATORY_CARE_PROVIDER_SITE_OTHER): Payer: Medicare Other | Admitting: Family Medicine

## 2013-06-17 ENCOUNTER — Encounter: Payer: Self-pay | Admitting: Family Medicine

## 2013-06-17 VITALS — BP 150/84 | HR 72 | Temp 99.0°F | Wt 135.0 lb

## 2013-06-17 DIAGNOSIS — I10 Essential (primary) hypertension: Secondary | ICD-10-CM

## 2013-06-17 DIAGNOSIS — E669 Obesity, unspecified: Secondary | ICD-10-CM

## 2013-06-17 DIAGNOSIS — G8929 Other chronic pain: Secondary | ICD-10-CM

## 2013-06-17 DIAGNOSIS — R109 Unspecified abdominal pain: Secondary | ICD-10-CM

## 2013-06-17 LAB — BASIC METABOLIC PANEL
BUN: 12 mg/dL (ref 6–23)
Creatinine, Ser: 0.7 mg/dL (ref 0.4–1.2)
GFR: 91.25 mL/min (ref >60.00)

## 2013-06-17 LAB — POCT URINALYSIS DIPSTICK
Blood, UA: NEGATIVE
Glucose, UA: NEGATIVE
Leukocytes, UA: NEGATIVE
Nitrite, UA: NEGATIVE
Urobilinogen, UA: NEGATIVE
pH, UA: 7

## 2013-06-17 MED ORDER — METOPROLOL SUCCINATE ER 50 MG PO TB24: ORAL_TABLET | ORAL | Status: DC

## 2013-06-17 NOTE — Patient Instructions (Addendum)

## 2013-06-17 NOTE — Progress Notes (Signed)
  Subjective:    Evelyn [name] is a 79 y.o. female who presents for evaluation of low back pain. The patient has had no prior back problems. Symptoms have been present for several days and are gradually worsening.  Onset was related to / precipitated by no known injury. The pain is located in the Right side - thoracic area and does not radiate. The pain is described as aching and occurs all day. She rates her pain as mild. Symptoms are exacerbated by nothing in particular. Symptoms are improved by acetaminophen. She has also tried nothing which provided no symptom relief. She has no other symptoms associated with the back pain. The patient has no "red flag" history indicative of complicated back pain.  The following portions of the patient's history were reviewed and updated as appropriate: allergies, current medications, past family history, past medical history, past social history, past surgical history and problem list.  Review of Systems Pertinent items are noted in HPI.    Objective:   Full range of motion without pain, no tenderness, no spasm, no curvature. Normal reflexes, gait, strength and negative straight-leg raise.    Assessment:    Nonspecific acute low back pain    Plan:    Natural history and expected course discussed. Questions answered. Agricultural engineer distributed. Stretching exercises discussed. Regular aerobic and trunk strengthening exercises discussed. Short (2-4 day) period of relative rest recommended until acute symptoms improve. Ice to affected area as needed for local pain relief. Heat to affected area as needed for local pain relief. OTC analgesics as needed. Ua neg-- culture pending  Pt to call with any new symptoms

## 2013-06-18 ENCOUNTER — Ambulatory Visit (INDEPENDENT_AMBULATORY_CARE_PROVIDER_SITE_OTHER): Payer: Medicare Other | Admitting: Cardiovascular Disease

## 2013-06-18 ENCOUNTER — Encounter: Payer: Self-pay | Admitting: Cardiovascular Disease

## 2013-06-18 VITALS — BP 160/72 | HR 67 | Ht 61.0 in | Wt 133.0 lb

## 2013-06-18 DIAGNOSIS — I739 Peripheral vascular disease, unspecified: Secondary | ICD-10-CM

## 2013-06-18 LAB — CULTURE, URINE COMPREHENSIVE
Colony Count: NO GROWTH
Organism ID, Bacteria: NO GROWTH

## 2013-06-18 NOTE — Assessment & Plan Note (Signed)
The patient has evidence of mild peripheral arterial disease with non-lifestyle limiting claudication involving both calves. Her ABI was slightly reduced bilaterally. Duplex imaging showed high-grade stenosis in the left SFA and diffuse disease in the right SFA.  Her symptoms improved after the addition of Pletal. However, she reports loose stool which could be related to this medication. I asked her to hold this medication for 3 days and see if there is improvement. If this is the culprit, it can be discontinued. I asked her to continue her walking program. Lower extremity angiography should be preserved for worsening symptoms or lifestyle limiting claudication which was discussed with her today.

## 2013-06-18 NOTE — Progress Notes (Signed)
HPI  This is an 79 year old female who is here today for a followup visit regarding claudication and peripheral arterial disease. Patient has no previous cardiac history. She has no history of diabetes and is a previous smoker. She has known history of hypertension and hyperlipidemia. Overall she's been healthy and active. She was seen a few months ago for  bilateral calf discomfort after walking about 100 feet. The discomfort is usually mild but does force her to stop for a few minutes before she can resume. ABI was slightly reduced bilaterally with evidence of focal stenosis in the left SFA and diffuse nonobstructive disease in the right SFA. She was started on Pletal 50 mg twice daily with some improvement in her claudication. However, she reports loose stool since starting the medication.   Allergies  Allergen Reactions  . Vicodin (Hydrocodone-Acetaminophen) Other (See Comments)    Abdominal Pain     Current Outpatient Prescriptions on File Prior to Visit  Medication Sig Dispense Refill  . aspirin 81 MG tablet Take 81 mg by mouth daily.        . calcium carbonate (OS-CAL) 600 MG TABS Take 600 mg by mouth daily.        . cilostazol (PLETAL) 50 MG tablet Take 1 tablet (50 mg total) by mouth 2 (two) times daily.  60 tablet  3  . fish oil-omega-3 fatty acids 1000 MG capsule Take 1 g by mouth daily.        Marland Kitchen latanoprost (XALATAN) 0.005 % ophthalmic solution Place 1 drop into both eyes at bedtime.        . metoprolol succinate (TOPROL-XL) 50 MG 24 hr tablet 1  tab po qd.  Take with or immediately following a meal.  90 tablet  3  . Multiple Vitamin (MULTIVITAMIN) tablet Take 1 tablet by mouth daily.        . Red Yeast Rice 600 MG TABS Take 1 tablet by mouth daily.         No current facility-administered medications on file prior to visit.     Past Medical History  Diagnosis Date  . Hypertension   . Osteoporosis   . Hyperlipidemia   . Post-menopausal     HRT     Past Surgical  History  Procedure Laterality Date  . Abdominal hysterectomy    . Breast lumpectomy  1975     Family History  Problem Relation Age of Onset  . Arthritis Mother   . Heart disease Mother   . Heart attack Father 20  . Prostate cancer Father   . COPD Father   . Glaucoma Father   . Hypertension Sister   . COPD Sister   . Arthritis Sister      History   Social History  . Marital Status: Widowed    Spouse Name: N/A    Number of Children: N/A  . Years of Education: N/A   Occupational History  . Not on file.   Social History Main Topics  . Smoking status: Former Smoker -- 0.75 packs/day for 18 years    Types: Cigarettes    Quit date: 02/20/1973  . Smokeless tobacco: Never Used  . Alcohol Use: Yes  . Drug Use: No  . Sexually Active: Yes -- Female partner(s)   Other Topics Concern  . Not on file   Social History Narrative   Exercise--no       PHYSICAL EXAM   BP 160/72  Pulse 67  Ht 5\' 1"  (1.549  m)  Wt 133 lb (60.328 kg)  BMI 25.14 kg/m2 Constitutional: She is oriented to person, place, and time. She appears well-developed and well-nourished. No distress.  HENT: No nasal discharge.  Head: Normocephalic and atraumatic.  Eyes: Pupils are equal and round. Right eye exhibits no discharge. Left eye exhibits no discharge.  Neck: Normal range of motion. Neck supple. No JVD present. No thyromegaly present.  Cardiovascular: Normal rate, regular rhythm, normal heart sounds. Exam reveals no gallop and no friction rub. No murmur heard.  Pulmonary/Chest: Effort normal and breath sounds normal. No stridor. No respiratory distress. She has no wheezes. She has no rales. She exhibits no tenderness.  Abdominal: Soft. Bowel sounds are normal. She exhibits no distension. There is no tenderness. There is no rebound and no guarding.  Musculoskeletal: Normal range of motion. She exhibits no edema and no tenderness.  Neurological: She is alert and oriented to person, place, and time.  Coordination normal.  Skin: Skin is warm and dry. No rash noted. She is not diaphoretic. No erythema. No pallor.  Psychiatric: She has a normal mood and affect. Her behavior is normal. Judgment and thought content normal.  Vascular: Femoral pulses normal bilaterally. DP: Normal on the right side and diminished on the left side. PT: Absent bilaterally.   ASSESSMENT AND PLAN

## 2013-06-18 NOTE — Patient Instructions (Addendum)
Your physician wants you to follow-up in: 6 MONTHS with Dr Arida.  You will receive a reminder letter in the mail two months in advance. If you don't receive a letter, please call our office to schedule the follow-up appointment.  Your physician recommends that you continue on your current medications as directed. Please refer to the Current Medication list given to you today.  

## 2013-08-15 ENCOUNTER — Encounter: Payer: Self-pay | Admitting: Family Medicine

## 2013-08-15 ENCOUNTER — Ambulatory Visit (INDEPENDENT_AMBULATORY_CARE_PROVIDER_SITE_OTHER): Payer: Medicare Other | Admitting: Family Medicine

## 2013-08-15 VITALS — BP 132/84 | HR 65 | Temp 98.4°F | Resp 12 | Wt 134.0 lb

## 2013-08-15 DIAGNOSIS — M722 Plantar fascial fibromatosis: Secondary | ICD-10-CM

## 2013-08-15 DIAGNOSIS — Z23 Encounter for immunization: Secondary | ICD-10-CM

## 2013-08-15 DIAGNOSIS — I10 Essential (primary) hypertension: Secondary | ICD-10-CM

## 2013-08-15 MED ORDER — LISINOPRIL 10 MG PO TABS: 10.0000 mg | ORAL_TABLET | Freq: Every day | ORAL | Status: DC

## 2013-08-15 MED ORDER — CILOSTAZOL 50 MG PO TABS: 50.0000 mg | ORAL_TABLET | Freq: Two times a day (BID) | ORAL | Status: DC

## 2013-08-15 NOTE — Patient Instructions (Signed)

## 2013-08-18 ENCOUNTER — Encounter: Payer: Self-pay | Admitting: Family Medicine

## 2013-08-18 NOTE — Assessment & Plan Note (Signed)
Gel inserts , tylenol Will refer to podiatry if no relief

## 2013-08-18 NOTE — Progress Notes (Signed)
  Subjective:    Patient here for follow-up of elevated blood pressure.  She is not exercising and is adherent to a low-salt diet.  Blood pressure is well controlled at home. Cardiac symptoms: none. Patient denies: chest pain, chest pressure/discomfort, claudication, dyspnea, exertional chest pressure/discomfort, fatigue, irregular heart beat, lower extremity edema, near-syncope, orthopnea, palpitations, paroxysmal nocturnal dyspnea, syncope and tachypnea. Cardiovascular risk factors: hypertension and sedentary lifestyle. Use of agents associated with hypertension: none. History of target organ damage: none.  The following portions of the patient's history were reviewed and updated as appropriate:  She  has a past medical history of Hypertension; Osteoporosis; Hyperlipidemia; and Post-menopausal. She  does not have any pertinent problems on file. She  has past surgical history that includes Abdominal hysterectomy and Breast lumpectomy (1975). Her family history includes Arthritis in her mother and sister; COPD in her father and sister; Glaucoma in her father; Heart attack (age of onset: 26) in her father; Heart disease in her mother; Hypertension in her sister; Prostate cancer in her father. She  reports that she quit smoking about 40 years ago. Her smoking use included Cigarettes. She has a 13.5 pack-year smoking history. She has never used smokeless tobacco. She reports that  drinks alcohol. She reports that she does not use illicit drugs. She has a current medication list which includes the following prescription(s): aspirin, calcium carbonate, cilostazol, fish oil-omega-3 fatty acids, latanoprost, metoprolol succinate, multivitamin, red yeast rice, and lisinopril. Current Outpatient Prescriptions on File Prior to Visit  Medication Sig Dispense Refill  . aspirin 81 MG tablet Take 81 mg by mouth daily.        . calcium carbonate (OS-CAL) 600 MG TABS Take 600 mg by mouth daily.        . fish  oil-omega-3 fatty acids 1000 MG capsule Take 1 g by mouth daily.        Marland Kitchen latanoprost (XALATAN) 0.005 % ophthalmic solution Place 1 drop into both eyes at bedtime.        . metoprolol succinate (TOPROL-XL) 50 MG 24 hr tablet 1  tab po qd.  Take with or immediately following a meal.  90 tablet  3  . Multiple Vitamin (MULTIVITAMIN) tablet Take 1 tablet by mouth daily.        . Red Yeast Rice 600 MG TABS Take 1 tablet by mouth daily.         No current facility-administered medications on file prior to visit.   She is allergic to vicodin..  Review of Systems Pertinent items are noted in HPI.     Objective:    BP 132/84  Pulse 65  Temp(Src) 98.4 F (36.9 C) (Oral)  Resp 12  Wt 134 lb (60.782 kg)  BMI 25.33 kg/m2  SpO2 95% General appearance: alert, cooperative, appears stated age and no distress Neck: no adenopathy, no carotid bruit, no JVD, supple, symmetrical, trachea midline and thyroid not enlarged, symmetric, no tenderness/mass/nodules Lungs: clear to auscultation bilaterally Heart: S1, S2 normal Extremities: extremities normal, atraumatic, no cyanosis or edema    Assessment:    Hypertension, normal blood pressure . Evidence of target organ damage: peripheral artery disease.    Plan:    Medication: no change. Dietary sodium restriction. Regular aerobic exercise. Check blood pressures 2-3 times weekly and record. Follow up: 6 months and as needed.

## 2013-08-22 ENCOUNTER — Ambulatory Visit: Payer: Medicare Other | Admitting: Family Medicine

## 2013-09-02 ENCOUNTER — Ambulatory Visit (INDEPENDENT_AMBULATORY_CARE_PROVIDER_SITE_OTHER): Payer: Medicare Other | Admitting: Family Medicine

## 2013-09-02 ENCOUNTER — Encounter: Payer: Self-pay | Admitting: Family Medicine

## 2013-09-02 VITALS — BP 179/105 | HR 58 | Temp 98.8°F | Wt 135.0 lb

## 2013-09-02 DIAGNOSIS — I10 Essential (primary) hypertension: Secondary | ICD-10-CM

## 2013-09-02 MED ORDER — LISINOPRIL 20 MG PO TABS: 20.0000 mg | ORAL_TABLET | Freq: Every day | ORAL | Status: DC

## 2013-09-02 NOTE — Progress Notes (Signed)
  Subjective:    Patient here for follow-up of elevated blood pressure.  She is not exercising and is adherent to a low-salt diet.  Blood pressure is not well controlled at home. Cardiac symptoms: none. Patient denies: chest pain, chest pressure/discomfort, claudication, dyspnea, exertional chest pressure/discomfort, fatigue, irregular heart beat, lower extremity edema, near-syncope, orthopnea, palpitations, paroxysmal nocturnal dyspnea, syncope and tachypnea. Cardiovascular risk factors: advanced age (older than 75 for men, 61 for women) and sedentary lifestyle. Use of agents associated with hypertension: none. History of target organ damage: none.  The following portions of the patient's history were reviewed and updated as appropriate: allergies, current medications, past family history, past medical history, past social history, past surgical history and problem list.  Review of Systems Pertinent items are noted in HPI.     Objective:    BP 179/105  Pulse 58  Temp(Src) 98.8 F (37.1 C) (Oral)  Wt 135 lb (61.236 kg)  BMI 25.52 kg/m2  SpO2 98% General appearance: alert, cooperative, appears stated age and no distress Throat: lips, mucosa, and tongue normal; teeth and gums normal Neck: no adenopathy, no carotid bruit, no JVD, supple, symmetrical, trachea midline and thyroid not enlarged, symmetric, no tenderness/mass/nodules Lungs: clear to auscultation bilaterally Heart: S1, S2 normal Extremities: extremities normal, atraumatic, no cyanosis or edema    Assessment:    Hypertension, stage 1 . Evidence of target organ damage: none.    Plan:    Medication: increase to lisinopril 20. Dietary sodium restriction. Regular aerobic exercise. Follow up: 1 month and as needed.

## 2013-09-02 NOTE — Patient Instructions (Addendum)

## 2013-09-04 LAB — HM DEXA SCAN

## 2013-09-04 LAB — HM MAMMOGRAPHY

## 2013-09-23 ENCOUNTER — Encounter: Payer: Self-pay | Admitting: Family Medicine

## 2013-09-23 ENCOUNTER — Ambulatory Visit (INDEPENDENT_AMBULATORY_CARE_PROVIDER_SITE_OTHER): Payer: Medicare Other | Admitting: Family Medicine

## 2013-09-23 VITALS — BP 172/70 | HR 61 | Temp 99.0°F | Wt 134.0 lb

## 2013-09-23 DIAGNOSIS — I10 Essential (primary) hypertension: Secondary | ICD-10-CM

## 2013-09-23 MED ORDER — LISINOPRIL 40 MG PO TABS: 40.0000 mg | ORAL_TABLET | Freq: Every day | ORAL | Status: DC

## 2013-09-23 NOTE — Patient Instructions (Addendum)
F/u 2-3 weeks for bp check   Hypertension As your heart beats, it forces blood through your arteries. This force is your blood pressure. If the pressure is too high, it is called hypertension (HTN) or high blood pressure. HTN is dangerous because you may have it and not know it. High blood pressure may mean that your heart has to work harder to pump blood. Your arteries may be narrow or stiff. The extra work puts you at risk for heart disease, stroke, and other problems.  Blood pressure consists of two numbers, a higher number over a lower, 110/72, for example. It is stated as "110 over 72." The ideal is below 120 for the top number (systolic) and under 80 for the bottom (diastolic). Write down your blood pressure today. You should pay close attention to your blood pressure if you have certain conditions such as:  Heart failure.  Prior heart attack.  Diabetes  Chronic kidney disease.  Prior stroke.  Multiple risk factors for heart disease. To see if you have HTN, your blood pressure should be measured while you are seated with your arm held at the level of the heart. It should be measured at least twice. A one-time elevated blood pressure reading (especially in the Emergency Department) does not mean that you need treatment. There may be conditions in which the blood pressure is different between your right and left arms. It is important to see your caregiver soon for a recheck. Most people have essential hypertension which means that there is not a specific cause. This type of high blood pressure may be lowered by changing lifestyle factors such as:  Stress.  Smoking.  Lack of exercise.  Excessive weight.  Drug/tobacco/alcohol use.  Eating less salt. Most people do not have symptoms from high blood pressure until it has caused damage to the body. Effective treatment can often prevent, delay or reduce that damage. TREATMENT  When a cause has been identified, treatment for high  blood pressure is directed at the cause. There are a large number of medications to treat HTN. These fall into several categories, and your caregiver will help you select the medicines that are best for you. Medications may have side effects. You should review side effects with your caregiver. If your blood pressure stays high after you have made lifestyle changes or started on medicines,   Your medication(s) may need to be changed.  Other problems may need to be addressed.  Be certain you understand your prescriptions, and know how and when to take your medicine.  Be sure to follow up with your caregiver within the time frame advised (usually within two weeks) to have your blood pressure rechecked and to review your medications.  If you are taking more than one medicine to lower your blood pressure, make sure you know how and at what times they should be taken. Taking two medicines at the same time can result in blood pressure that is too low. SEEK IMMEDIATE MEDICAL CARE IF:  You develop a severe headache, blurred or changing vision, or confusion.  You have unusual weakness or numbness, or a faint feeling.  You have severe chest or abdominal pain, vomiting, or breathing problems. MAKE SURE YOU:   Understand these instructions.  Will watch your condition.  Will get help right away if you are not doing well or get worse. Document Released: 11/14/2005 Document Revised: 02/06/2012 Document Reviewed: 07/04/2008 [facility] Patient Information 2014 [place], [place].

## 2013-09-23 NOTE — Assessment & Plan Note (Addendum)
Inc lisinopril 40 mg qd rto 2-3 weeks

## 2013-09-23 NOTE — Progress Notes (Signed)
  Subjective:    Patient here for follow-up of elevated blood pressure.  She is exercising and is adherent to a low-salt diet.  Blood pressure is not well controlled at home. Cardiac symptoms: light headed. Patient denies: chest pain, chest pressure/discomfort, claudication, dyspnea, exertional chest pressure/discomfort, fatigue, irregular heart beat, lower extremity edema, near-syncope, orthopnea, palpitations, paroxysmal nocturnal dyspnea, syncope and tachypnea. Cardiovascular risk factors: hypertension and sedentary lifestyle. Use of agents associated with hypertension: none. History of target organ damage: none.  The following portions of the patient's history were reviewed and updated as appropriate: allergies, current medications, past family history, past medical history, past social history, past surgical history and problem list.  Review of Systems Pertinent items are noted in HPI.     Objective:    BP 172/70  Pulse 61  Temp(Src) 99 F (37.2 C) (Oral)  Wt 134 lb (60.782 kg)  BMI 25.33 kg/m2  SpO2 96% General appearance: alert, cooperative, appears stated age and no distress Throat: lips, mucosa, and tongue normal; teeth and gums normal Neck: no adenopathy, supple, symmetrical, trachea midline and thyroid not enlarged, symmetric, no tenderness/mass/nodules Lungs: clear to auscultation bilaterally Heart: S1, S2 normal Extremities: extremities normal, atraumatic, no cyanosis or edema    Assessment:    Hypertension, stage 1 . Evidence of target organ damage: none.    Plan:    Medication: increase to lisinopril 40 mg. Dietary sodium restriction. Regular aerobic exercise. Follow up: 3 weeks and as needed.

## 2013-10-02 ENCOUNTER — Ambulatory Visit: Payer: Medicare Other

## 2013-10-03 ENCOUNTER — Ambulatory Visit (INDEPENDENT_AMBULATORY_CARE_PROVIDER_SITE_OTHER): Payer: Medicare Other | Admitting: Family Medicine

## 2013-10-03 ENCOUNTER — Encounter: Payer: Self-pay | Admitting: Family Medicine

## 2013-10-03 VITALS — BP 134/86 | HR 54 | Temp 98.7°F | Wt 135.4 lb

## 2013-10-03 DIAGNOSIS — M810 Age-related osteoporosis without current pathological fracture: Secondary | ICD-10-CM

## 2013-10-03 DIAGNOSIS — I10 Essential (primary) hypertension: Secondary | ICD-10-CM

## 2013-10-03 MED ORDER — DENOSUMAB 60 MG/ML ~~LOC~~ SOLN
60.0000 mg | Freq: Once | SUBCUTANEOUS | Status: AC
  Administered 2013-10-03: 60 mg via SUBCUTANEOUS

## 2013-10-03 NOTE — Progress Notes (Signed)
  Subjective:    Patient here for follow-up of elevated blood pressure.  She is not exercising and is adherent to a low-salt diet.  Blood pressure is well controlled at home. Cardiac symptoms: none. Patient denies: chest pain, chest pressure/discomfort, claudication, dyspnea, exertional chest pressure/discomfort, fatigue, irregular heart beat, lower extremity edema, near-syncope, orthopnea, palpitations, paroxysmal nocturnal dyspnea, syncope and tachypnea. Cardiovascular risk factors: advanced age (older than 73 for men, 62 for women) and hypertension. Use of agents associated with hypertension: none. History of target organ damage: none.  The following portions of the patient's history were reviewed and updated as appropriate: allergies, current medications, past family history, past medical history, past social history, past surgical history and problem list.  Review of Systems Pertinent items are noted in HPI.     Objective:    BP 134/86  Pulse 54  Temp(Src) 98.7 F (37.1 C) (Oral)  Wt 135 lb 6.4 oz (61.417 kg)  SpO2 97% General appearance: alert, cooperative, appears stated age and no distress Neck: no adenopathy, supple, symmetrical, trachea midline and thyroid not enlarged, symmetric, no tenderness/mass/nodules Lungs: clear to auscultation bilaterally Heart: S1, S2 normal    Assessment:    Hypertension, normal blood pressure . Evidence of target organ damage: none.    Plan:    Medication: no change. Dietary sodium restriction. Regular aerobic exercise. Check blood pressures 2-3 times weekly and record. Follow up: 3 months and as needed.

## 2013-10-03 NOTE — Assessment & Plan Note (Signed)
prolia given in office.

## 2013-10-03 NOTE — Patient Instructions (Signed)

## 2013-10-17 ENCOUNTER — Ambulatory Visit: Payer: Medicare Other | Admitting: Family Medicine

## 2013-12-24 ENCOUNTER — Encounter: Payer: Self-pay | Admitting: Cardiovascular Disease

## 2013-12-24 ENCOUNTER — Encounter (INDEPENDENT_AMBULATORY_CARE_PROVIDER_SITE_OTHER): Payer: Self-pay

## 2013-12-24 ENCOUNTER — Ambulatory Visit (INDEPENDENT_AMBULATORY_CARE_PROVIDER_SITE_OTHER): Payer: Medicare Other | Admitting: Cardiovascular Disease

## 2013-12-24 VITALS — BP 161/70 | HR 54 | Ht 61.0 in | Wt 135.0 lb

## 2013-12-24 DIAGNOSIS — I739 Peripheral vascular disease, unspecified: Secondary | ICD-10-CM

## 2013-12-24 DIAGNOSIS — E785 Hyperlipidemia, unspecified: Secondary | ICD-10-CM

## 2013-12-24 NOTE — Assessment & Plan Note (Signed)
Lab Results  Component Value Date   CHOL 210* 03/26/2013   HDL 70.30 03/26/2013   LDLCALC 85 07/14/2011   LDLDIRECT 130.5 03/26/2013   TRIG 94.0 03/26/2013   CHOLHDL 3 03/26/2013   She is being treated with red yeast rice.   

## 2013-12-24 NOTE — Progress Notes (Signed)
HPI  This is an 80 year old female who is here today for a followup visit regarding claudication and peripheral arterial disease. Patient has no previous cardiac history. She has no history of diabetes and is a previous smoker. She has known history of hypertension and hyperlipidemia. Overall she's been healthy and active. She was seen last year for  bilateral calf discomfort after walking about 100 feet.  ABI was slightly reduced bilaterally with evidence of focal stenosis in the left SFA and diffuse nonobstructive disease in the right SFA. She was started on Pletal 50 mg twice daily with some improvement in her claudication. However, she did not tolerate the medication due to diarrhea. The medication was stopped. Her symptoms are still the same. Although she has claudication, she does not feel that this is limiting her activities. The claudication is worse on the left side than the right side. She denies any chest pain or dyspnea.  Allergies  Allergen Reactions  . Vicodin [Hydrocodone-Acetaminophen] Other (See Comments)    Abdominal Pain     Current Outpatient Prescriptions on File Prior to Visit  Medication Sig Dispense Refill  . aspirin 81 MG tablet Take 81 mg by mouth daily.        . calcium carbonate (OS-CAL) 600 MG TABS Take 600 mg by mouth daily.        . fish oil-omega-3 fatty acids 1000 MG capsule Take 1 g by mouth daily.        Marland Kitchen latanoprost (XALATAN) 0.005 % ophthalmic solution Place 1 drop into both eyes at bedtime.        Marland Kitchen lisinopril (PRINIVIL,ZESTRIL) 40 MG tablet Take 1 tablet (40 mg total) by mouth daily.  90 tablet  3  . metoprolol succinate (TOPROL-XL) 50 MG 24 hr tablet 1  tab po qd.  Take with or immediately following a meal.  90 tablet  3  . Multiple Vitamin (MULTIVITAMIN) tablet Take 1 tablet by mouth daily.        . Red Yeast Rice 600 MG TABS Take 1 tablet by mouth daily.         No current facility-administered medications on file prior to visit.     Past  Medical History  Diagnosis Date  . Hypertension   . Osteoporosis   . Hyperlipidemia   . Post-menopausal     HRT     Past Surgical History  Procedure Laterality Date  . Abdominal hysterectomy    . Breast lumpectomy  1975     Family History  Problem Relation Age of Onset  . Arthritis Mother   . Heart disease Mother   . Heart attack Father 86  . Prostate cancer Father   . COPD Father   . Glaucoma Father   . Hypertension Sister   . COPD Sister   . Arthritis Sister      History   Social History  . Marital Status: Widowed    Spouse Name: N/A    Number of Children: N/A  . Years of Education: N/A   Occupational History  . Not on file.   Social History Main Topics  . Smoking status: Former Smoker -- 0.75 packs/day for 18 years    Types: Cigarettes    Quit date: 02/20/1973  . Smokeless tobacco: Never Used  . Alcohol Use: Yes  . Drug Use: No  . Sexual Activity: Yes    Partners: Male   Other Topics Concern  . Not on file   Social History Narrative  Exercise--no       PHYSICAL EXAM   BP 161/70  Pulse 54  Ht 5\' 1"  (1.549 m)  Wt 135 lb (61.236 kg)  BMI 25.52 kg/m2 Constitutional: She is oriented to person, place, and time. She appears well-developed and well-nourished. No distress.  HENT: No nasal discharge.  Head: Normocephalic and atraumatic.  Eyes: Pupils are equal and round. Right eye exhibits no discharge. Left eye exhibits no discharge.  Neck: Normal range of motion. Neck supple. No JVD present. No thyromegaly present.  Cardiovascular: Normal rate, regular rhythm, normal heart sounds. Exam reveals no gallop and no friction rub. No murmur heard.  Pulmonary/Chest: Effort normal and breath sounds normal. No stridor. No respiratory distress. She has no wheezes. She has no rales. She exhibits no tenderness.  Abdominal: Soft. Bowel sounds are normal. She exhibits no distension. There is no tenderness. There is no rebound and no guarding.    Musculoskeletal: Normal range of motion. She exhibits no edema and no tenderness.  Neurological: She is alert and oriented to person, place, and time. Coordination normal.  Skin: Skin is warm and dry. No rash noted. She is not diaphoretic. No erythema. No pallor.  Psychiatric: She has a normal mood and affect. Her behavior is normal. Judgment and thought content normal.  Vascular: Femoral pulses normal bilaterally. DP: Normal on the right side and absent on the left side. PT: Absent bilaterally.  EKG: Normal sinus rhythm with poor R-wave progression in the anterior leads.  ASSESSMENT AND PLAN

## 2013-12-24 NOTE — Patient Instructions (Addendum)
Your physician wants you to follow-up in:  6 months.  You will receive a reminder letter in the mail two months in advance. If you don't receive a letter, please call our office to schedule the follow-up appointment.   Your physician has requested that you have a lower  extremity arterial duplex and lower extremity arterial doppler. This test is an ultrasound of the arteries in the legs or arms. It looks at arterial blood flow in the legs and arms. Allow one hour for Lower and Upper Arterial scans. There are no restrictions or special instructions. To be done in 6 months on same day as appointment with Dr. Fletcher Anon

## 2013-12-24 NOTE — Assessment & Plan Note (Signed)
She has stable claudication worse on the left side than the right side due to known SFA disease. She did not tolerate treatment with cilostazol. I discussed management options with her which include continued medical therapy and a walking program versus proceeding with angiography and possible endovascular intervention to target the left SFA. She prefers continued medical therapy for now as he doesn't feel limited enough. I will have her followup in 6 months with lower extremity arterial duplex.

## 2014-04-10 ENCOUNTER — Telehealth: Payer: Self-pay | Admitting: Family Medicine

## 2014-04-10 NOTE — Telephone Encounter (Signed)
Called patient and lmovm to call our office to schedule her Prolia injection.

## 2014-04-14 NOTE — Telephone Encounter (Signed)
FYI  ---Call from patient and she stated she no longer wants to take the prolia or anything for her Osteoporosis. She said since she has been on Prolia she has had teeth problems (6 cavities) and she has never had any issues in the past and she also is having some problems with her Skin. She said she is willing to double up on Calcium and vitamin D. She also stated she did not want Re-clast because she felt the side effects would be similar.

## 2014-04-14 NOTE — Telephone Encounter (Signed)
Noted-- pt refusing any osteoporosis treatment

## 2014-06-24 ENCOUNTER — Encounter: Payer: Self-pay | Admitting: Cardiovascular Disease

## 2014-06-24 ENCOUNTER — Ambulatory Visit (HOSPITAL_COMMUNITY): Payer: Medicare Other | Attending: Internal Medicine | Admitting: Cardiology

## 2014-06-24 ENCOUNTER — Ambulatory Visit (INDEPENDENT_AMBULATORY_CARE_PROVIDER_SITE_OTHER): Payer: Medicare Other | Admitting: Cardiovascular Disease

## 2014-06-24 ENCOUNTER — Encounter (HOSPITAL_COMMUNITY): Payer: Medicare Other

## 2014-06-24 VITALS — BP 141/72 | HR 50 | Ht 61.0 in | Wt 131.4 lb

## 2014-06-24 DIAGNOSIS — I739 Peripheral vascular disease, unspecified: Secondary | ICD-10-CM

## 2014-06-24 DIAGNOSIS — E785 Hyperlipidemia, unspecified: Secondary | ICD-10-CM | POA: Diagnosis not present

## 2014-06-24 DIAGNOSIS — Z87891 Personal history of nicotine dependence: Secondary | ICD-10-CM | POA: Insufficient documentation

## 2014-06-24 DIAGNOSIS — I10 Essential (primary) hypertension: Secondary | ICD-10-CM | POA: Insufficient documentation

## 2014-06-24 NOTE — Assessment & Plan Note (Signed)
She has stable moderate claudication worse on the left side than the right side due to known SFA disease. She did not tolerate treatment with cilostazol. She is getting a followup lower extremity arterial duplex today. I think it's reasonable to continue medical therapy and a walking program. Followup in 6 months.

## 2014-06-24 NOTE — Assessment & Plan Note (Signed)
Lab Results  Component Value Date   CHOL 210* 03/26/2013   HDL 70.30 03/26/2013   LDLCALC 85 07/14/2011   LDLDIRECT 130.5 03/26/2013   TRIG 94.0 03/26/2013   CHOLHDL 3 03/26/2013   She is being treated with red yeast rice.

## 2014-06-24 NOTE — Progress Notes (Signed)
HPI  This is an 80 year old female who is here today for a followup visit regarding claudication and peripheral arterial disease. Patient has no previous cardiac history. She has no history of diabetes and is a previous smoker. She has known history of hypertension and hyperlipidemia. Overall she's been healthy and active. She was seen in 2014 for  bilateral calf discomfort after walking about 100 feet.  ABI was slightly reduced bilaterally with evidence of focal stenosis in the left SFA and diffuse nonobstructive disease in the right SFA. She was started on Pletal 50 mg twice daily with some improvement in her claudication. However, she did not tolerate the medication due to diarrhea. The medication was stopped. Her symptoms are still the same. Although she has claudication, she does not feel that this is limiting her activities. The claudication is worse on the left side than the right side. This has not changed since the last visit.  She denies any chest pain or dyspnea.  Allergies  Allergen Reactions  . Pletaal [Cilostazol] Diarrhea  . Vicodin [Hydrocodone-Acetaminophen] Other (See Comments)    Abdominal Pain     Current Outpatient Prescriptions on File Prior to Visit  Medication Sig Dispense Refill  . aspirin 81 MG tablet Take 81 mg by mouth daily.        . calcium carbonate (OS-CAL) 600 MG TABS Take 600 mg by mouth daily.        . fish oil-omega-3 fatty acids 1000 MG capsule Take 1 g by mouth daily.        Marland Kitchen latanoprost (XALATAN) 0.005 % ophthalmic solution Place 1 drop into both eyes at bedtime.        Marland Kitchen lisinopril (PRINIVIL,ZESTRIL) 40 MG tablet Take 1 tablet (40 mg total) by mouth daily.  90 tablet  3  . metoprolol succinate (TOPROL-XL) 50 MG 24 hr tablet 1  tab po qd.  Take with or immediately following a meal.  90 tablet  3  . Multiple Vitamin (MULTIVITAMIN) tablet Take 1 tablet by mouth daily.        . Red Yeast Rice 600 MG TABS Take 1 tablet by mouth daily.         No  current facility-administered medications on file prior to visit.     Past Medical History  Diagnosis Date  . Hypertension   . Osteoporosis   . Hyperlipidemia   . Post-menopausal     HRT     Past Surgical History  Procedure Laterality Date  . Abdominal hysterectomy    . Breast lumpectomy  1975     Family History  Problem Relation Age of Onset  . Arthritis Mother   . Heart disease Mother   . Heart attack Father 9  . Prostate cancer Father   . COPD Father   . Glaucoma Father   . Hypertension Sister   . COPD Sister   . Arthritis Sister      History   Social History  . Marital Status: Widowed    Spouse Name: N/A    Number of Children: N/A  . Years of Education: N/A   Occupational History  . Not on file.   Social History Main Topics  . Smoking status: Former Smoker -- 0.75 packs/day for 18 years    Types: Cigarettes    Quit date: 02/20/1973  . Smokeless tobacco: Never Used  . Alcohol Use: Yes  . Drug Use: No  . Sexual Activity: Yes    Partners: Male  Other Topics Concern  . Not on file   Social History Narrative   Exercise--no       PHYSICAL EXAM   BP 141/72  Pulse 50  Ht 5\' 1"  (1.549 m)  Wt 131 lb 6.4 oz (59.603 kg)  BMI 24.84 kg/m2 Constitutional: She is oriented to person, place, and time. She appears well-developed and well-nourished. No distress.  HENT: No nasal discharge.  Head: Normocephalic and atraumatic.  Eyes: Pupils are equal and round. Right eye exhibits no discharge. Left eye exhibits no discharge.  Neck: Normal range of motion. Neck supple. No JVD present. No thyromegaly present.  Cardiovascular: Normal rate, regular rhythm, normal heart sounds. Exam reveals no gallop and no friction rub. No murmur heard.  Pulmonary/Chest: Effort normal and breath sounds normal. No stridor. No respiratory distress. She has no wheezes. She has no rales. She exhibits no tenderness.  Abdominal: Soft. Bowel sounds are normal. She exhibits no  distension. There is no tenderness. There is no rebound and no guarding.  Musculoskeletal: Normal range of motion. She exhibits no edema and no tenderness.  Neurological: She is alert and oriented to person, place, and time. Coordination normal.  Skin: Skin is warm and dry. No rash noted. She is not diaphoretic. No erythema. No pallor.  Psychiatric: She has a normal mood and affect. Her behavior is normal. Judgment and thought content normal.  Vascular: Femoral pulses normal bilaterally. DP: Normal on the right side and absent on the left side. PT: Absent bilaterally.    ASSESSMENT AND PLAN

## 2014-06-24 NOTE — Patient Instructions (Signed)
Your physician recommends that you continue on your current medications as directed. Please refer to the Current Medication list given to you today.  Your physician wants you to follow-up in: 6 months with Dr. Fletcher Anon. You will receive a reminder letter in the mail two months in advance. If you don't receive a letter, please call our office to schedule the follow-up appointment.

## 2014-06-24 NOTE — Progress Notes (Signed)
LEA Doppler single level (ABI) performed

## 2014-06-30 ENCOUNTER — Other Ambulatory Visit: Payer: Self-pay | Admitting: Family Medicine

## 2014-09-29 ENCOUNTER — Other Ambulatory Visit: Payer: Self-pay | Admitting: Family Medicine

## 2014-10-07 ENCOUNTER — Encounter: Payer: Self-pay | Admitting: Family Medicine

## 2014-10-07 ENCOUNTER — Ambulatory Visit (INDEPENDENT_AMBULATORY_CARE_PROVIDER_SITE_OTHER): Payer: Medicare Other | Admitting: Family Medicine

## 2014-10-07 VITALS — BP 130/82 | HR 63 | Temp 98.9°F | Wt 135.4 lb

## 2014-10-07 DIAGNOSIS — I10 Essential (primary) hypertension: Secondary | ICD-10-CM

## 2014-10-07 DIAGNOSIS — Z23 Encounter for immunization: Secondary | ICD-10-CM

## 2014-10-07 MED ORDER — LISINOPRIL 40 MG PO TABS: 40.0000 mg | ORAL_TABLET | Freq: Every day | ORAL | Status: DC

## 2014-10-07 MED ORDER — METOPROLOL SUCCINATE ER 50 MG PO TB24: 50.0000 mg | ORAL_TABLET | Freq: Every day | ORAL | Status: DC

## 2014-10-07 NOTE — Patient Instructions (Signed)

## 2014-10-07 NOTE — Progress Notes (Signed)
  Subjective:    Patient here for follow-up of elevated blood pressure.  She is not exercising and is adherent to a low-salt diet.  Blood pressure is well controlled at home. Cardiac symptoms: none. Patient denies: chest pain, chest pressure/discomfort, claudication, dyspnea, exertional chest pressure/discomfort, fatigue, irregular heart beat, lower extremity edema, near-syncope, orthopnea, palpitations, paroxysmal nocturnal dyspnea, syncope and tachypnea. Cardiovascular risk factors: advanced age (older than 34 for men, 55 for women), hypertension and sedentary lifestyle. Use of agents associated with hypertension: none. History of target organ damage: none. Pt sees cardiology for pvd.  The following portions of the patient's history were reviewed and updated as appropriate: allergies, current medications, past family history, past medical history, past social history, past surgical history and problem list.  Review of Systems Pertinent items are noted in HPI.     Objective:    BP 130/82 mmHg  Pulse 63  Temp(Src) 98.9 F (37.2 C) (Oral)  Wt 135 lb 6.4 oz (61.417 kg)  SpO2 96% General appearance: alert, cooperative, appears stated age and no distress Lungs: clear to auscultation bilaterally Heart: S1, S2 normal Extremities: extremities normal, atraumatic, no cyanosis or edema    Assessment:    Hypertension, normal blood pressure . Evidence of target organ damage: none.    Plan:    Medication: no change. Dietary sodium restriction. Regular aerobic exercise. Follow up: 6 months and as needed.    1. Need for immunization against influenza   - Flu Vaccine QUAD 36+ mos IM (Fluarix)  2. Essential hypertension stable - lisinopril (PRINIVIL,ZESTRIL) 40 MG tablet; Take 1 tablet (40 mg total) by mouth daily.  Dispense: 90 tablet; Refill: 3 - metoprolol succinate (TOPROL-XL) 50 MG 24 hr tablet; Take 1 tablet (50 mg total) by mouth daily.  Dispense: 90 tablet; Refill: 3  3. Need for  pneumococcal vaccine   - Pneumococcal conjugate vaccine 13-valent

## 2014-10-07 NOTE — Progress Notes (Signed)
Pre visit review using our clinic review tool, if applicable. No additional management support is needed unless otherwise documented below in the visit note. 

## 2014-11-14 ENCOUNTER — Encounter: Payer: Self-pay | Admitting: Family Medicine

## 2014-12-23 ENCOUNTER — Ambulatory Visit: Payer: Medicare Other | Admitting: Cardiovascular Disease

## 2014-12-30 ENCOUNTER — Encounter: Payer: Self-pay | Admitting: Cardiovascular Disease

## 2014-12-30 ENCOUNTER — Ambulatory Visit (INDEPENDENT_AMBULATORY_CARE_PROVIDER_SITE_OTHER): Payer: Medicare Other | Admitting: Cardiovascular Disease

## 2014-12-30 VITALS — BP 180/92 | HR 53 | Ht 61.0 in | Wt 134.4 lb

## 2014-12-30 DIAGNOSIS — I739 Peripheral vascular disease, unspecified: Secondary | ICD-10-CM

## 2014-12-30 DIAGNOSIS — I10 Essential (primary) hypertension: Secondary | ICD-10-CM

## 2014-12-30 DIAGNOSIS — E785 Hyperlipidemia, unspecified: Secondary | ICD-10-CM

## 2014-12-30 NOTE — Patient Instructions (Addendum)
Your physician wants you to follow-up in: 1 YEAR with Dr Fletcher Anon.  You will receive a reminder letter in the mail two months in advance. If you don't receive a letter, please call our office to schedule the follow-up appointment.  Your physician recommends that you continue on your current medications as directed. Please refer to the Current Medication list given to you today.  Your physician has requested that you regularly monitor and record your blood pressure readings at home. Please use the same machine at the same time of day to check your readings and record them to bring to your follow-up visit.

## 2014-12-30 NOTE — Progress Notes (Signed)
HPI  This is an 81 year old female who is here today for a followup visit regarding claudication and peripheral arterial disease. Patient has no previous cardiac history. She has no history of diabetes and is a previous smoker. She has known history of hypertension and hyperlipidemia. Overall she's been healthy and active. She was seen in 2014 for  bilateral calf discomfort after walking about 100 feet.  ABI was slightly reduced bilaterally with evidence of focal stenosis in the left SFA and diffuse nonobstructive disease in the right SFA. She was started with Pletal with some improvement in claudication. However, she did not tolerate the medication due to diarrhea. Claudication was not lifestyle limiting and thus she was treated medically.  Her symptoms are still unchanged. She is able to walk 100 feet without significant discomfort. She reports that symptoms are equal in both sides. Blood pressure is elevated today. However, she reports normal readings at home.  Allergies  Allergen Reactions  . Vicodin [Hydrocodone-Acetaminophen] Other (See Comments)    Abdominal Pain  . Pletaal [Cilostazol] Diarrhea     Current Outpatient Prescriptions on File Prior to Visit  Medication Sig Dispense Refill  . Ascorbic Acid (VITAMIN C PO) Take 1 tablet by mouth daily. Pt unsure of how many mgs.    Marland Kitchen aspirin 81 MG tablet Take 81 mg by mouth daily.      . calcium carbonate (OS-CAL) 600 MG TABS Take 600 mg by mouth daily.      . Cholecalciferol (VITAMIN D PO) Take 1 capsule by mouth daily. Pt unsure of mgs.    . fish oil-omega-3 fatty acids 1000 MG capsule Take 1 g by mouth daily.      Marland Kitchen glucosamine-chondroitin 500-400 MG tablet Take 1 tablet by mouth 3 (three) times daily.    Marland Kitchen latanoprost (XALATAN) 0.005 % ophthalmic solution Place 1 drop into both eyes at bedtime.      Marland Kitchen lisinopril (PRINIVIL,ZESTRIL) 40 MG tablet Take 1 tablet (40 mg total) by mouth daily. 90 tablet 3  . metoprolol succinate (TOPROL-XL)  50 MG 24 hr tablet Take 1 tablet (50 mg total) by mouth daily. 90 tablet 3  . Multiple Vitamin (MULTIVITAMIN) tablet Take 1 tablet by mouth daily.      . Red Yeast Rice 600 MG TABS Take 1 tablet by mouth daily.       No current facility-administered medications on file prior to visit.     Past Medical History  Diagnosis Date  . Hypertension   . Osteoporosis   . Hyperlipidemia   . Post-menopausal     HRT     Past Surgical History  Procedure Laterality Date  . Abdominal hysterectomy    . Breast lumpectomy  1975     Family History  Problem Relation Age of Onset  . Arthritis Mother   . Heart disease Mother   . Heart attack Father 12  . Prostate cancer Father   . COPD Father   . Glaucoma Father   . Hypertension Sister   . COPD Sister   . Arthritis Sister      History   Social History  . Marital Status: Widowed    Spouse Name: N/A    Number of Children: N/A  . Years of Education: N/A   Occupational History  . Not on file.   Social History Main Topics  . Smoking status: Former Smoker -- 0.75 packs/day for 18 years    Types: Cigarettes    Quit date: 02/20/1973  .  Smokeless tobacco: Never Used  . Alcohol Use: Yes  . Drug Use: No  . Sexual Activity:    Partners: Male   Other Topics Concern  . Not on file   Social History Narrative   Exercise--no       PHYSICAL EXAM   BP 180/92 mmHg  Pulse 53  Ht 5\' 1"  (1.549 m)  Wt 134 lb 6.4 oz (60.963 kg)  BMI 25.41 kg/m2 Constitutional: She is oriented to person, place, and time. She appears well-developed and well-nourished. No distress.  HENT: No nasal discharge.  Head: Normocephalic and atraumatic.  Eyes: Pupils are equal and round. Right eye exhibits no discharge. Left eye exhibits no discharge.  Neck: Normal range of motion. Neck supple. No JVD present. No thyromegaly present.  Cardiovascular: Normal rate, regular rhythm, normal heart sounds. Exam reveals no gallop and no friction rub. No murmur heard.    Pulmonary/Chest: Effort normal and breath sounds normal. No stridor. No respiratory distress. She has no wheezes. She has no rales. She exhibits no tenderness.  Abdominal: Soft. Bowel sounds are normal. She exhibits no distension. There is no tenderness. There is no rebound and no guarding.  Musculoskeletal: Normal range of motion. She exhibits no edema and no tenderness.  Neurological: She is alert and oriented to person, place, and time. Coordination normal.  Skin: Skin is warm and dry. No rash noted. She is not diaphoretic. No erythema. No pallor.  Psychiatric: She has a normal mood and affect. Her behavior is normal. Judgment and thought content normal.  Vascular: Femoral pulses normal bilaterally. DP: Normal on the right side and absent on the left side. PT: Absent bilaterally.  EKG: Normal sinus rhythm with nonspecific T wave changes.    ASSESSMENT AND PLAN

## 2015-01-04 NOTE — Assessment & Plan Note (Signed)
Blood pressure is elevated today. However, she reports relatively normal readings at home. Continue to monitor.

## 2015-01-04 NOTE — Assessment & Plan Note (Signed)
Lab Results  Component Value Date   CHOL 210* 03/26/2013   HDL 70.30 03/26/2013   LDLCALC 85 07/14/2011   LDLDIRECT 130.5 03/26/2013   TRIG 94.0 03/26/2013   CHOLHDL 3 03/26/2013   consider treatment with a statin if she is able to tolerate. She is currently on red yeast Rice

## 2015-01-04 NOTE — Assessment & Plan Note (Signed)
She has stable moderate bilateral calf claudication  due to known SFA disease. She did not tolerate treatment with cilostazol. I recommend continuing medical therapy and reserve angiography and revascularization for worsening symptoms.

## 2015-02-26 ENCOUNTER — Encounter: Payer: Self-pay | Admitting: Family Medicine

## 2015-02-26 ENCOUNTER — Ambulatory Visit (INDEPENDENT_AMBULATORY_CARE_PROVIDER_SITE_OTHER): Payer: Medicare Other | Admitting: Family Medicine

## 2015-02-26 VITALS — BP 122/72 | HR 58 | Temp 98.7°F | Wt 130.8 lb

## 2015-02-26 DIAGNOSIS — J302 Other seasonal allergic rhinitis: Secondary | ICD-10-CM | POA: Diagnosis not present

## 2015-02-26 MED ORDER — FEXOFENADINE HCL 180 MG PO TABS: 180.0000 mg | ORAL_TABLET | Freq: Every day | ORAL | Status: DC

## 2015-02-26 NOTE — Progress Notes (Signed)
  Subjective:     Evelyn [name] is a 81 y.o. female here for evaluation of a cough. Onset of symptoms was 4 weeks ago. Symptoms have been gradually worsening since that time. The cough is dry and is aggravated by pollens. Associated symptoms include: postnasal drip. Patient does not have a history of asthma. Patient does not have a history of environmental allergens. Patient has not traveled recently. Patient does not have a history of smoking. Patient has not had a previous chest x-ray. Patient has not had a PPD done.  The following portions of the patient's history were reviewed and updated as appropriate: allergies, current medications, past family history, past medical history, past social history, past surgical history and problem list.  Review of Systems Pertinent items are noted in HPI.    Objective:    Oxygen saturation 97% on room air BP 122/72 mmHg  Pulse 58  Temp(Src) 98.7 F (37.1 C) (Oral)  Wt 130 lb 12.8 oz (59.33 kg)  SpO2 97% General appearance: alert, cooperative, appears stated age and no distress Ears: normal TM's and external ear canals both ears Nose: Nares normal. Septum midline. Mucosa normal. No drainage or sinus tenderness. Throat: lips, mucosa, and tongue normal; teeth and gums normal Neck: no adenopathy, no carotid bruit, no JVD, supple, symmetrical, trachea midline and thyroid not enlarged, symmetric, no tenderness/mass/nodules Lungs: diminished breath sounds bilaterally Heart: regular rate and rhythm, S1, S2 normal, no murmur, click, rub or gallop    Assessment:    Allergic Rhinitis    Plan:    Explained lack of efficacy of antibiotics in viral disease. Antitussives per medication orders. Avoid exposure to tobacco smoke and fumes. Call if shortness of breath worsens, blood in sputum, change in character of cough, development of fever or chills, inability to maintain nutrition and hydration. Avoid exposure to tobacco smoke and fumes. Trial of  antihistamines. Trial of steroid nasal spray.

## 2015-02-26 NOTE — Progress Notes (Signed)
Pre visit review using our clinic review tool, if applicable. No additional management support is needed unless otherwise documented below in the visit note. 

## 2015-02-26 NOTE — Patient Instructions (Signed)

## 2015-04-09 ENCOUNTER — Ambulatory Visit: Payer: Medicare Other | Admitting: Family Medicine

## 2015-04-13 ENCOUNTER — Telehealth: Payer: Self-pay | Admitting: Family Medicine

## 2015-04-13 NOTE — Telephone Encounter (Signed)
Pt called in. Stated she was out of town 04/09/15 and did not know about the appt. She received the msg when she came home. She apologized for the error.

## 2015-04-14 ENCOUNTER — Ambulatory Visit (INDEPENDENT_AMBULATORY_CARE_PROVIDER_SITE_OTHER): Payer: Medicare Other | Admitting: Family Medicine

## 2015-04-14 ENCOUNTER — Encounter: Payer: Self-pay | Admitting: Family Medicine

## 2015-04-14 VITALS — BP 112/66 | HR 65 | Temp 98.1°F | Resp 16 | Ht 61.0 in | Wt 133.0 lb

## 2015-04-14 DIAGNOSIS — J302 Other seasonal allergic rhinitis: Secondary | ICD-10-CM | POA: Diagnosis not present

## 2015-04-14 DIAGNOSIS — R195 Other fecal abnormalities: Secondary | ICD-10-CM

## 2015-04-14 DIAGNOSIS — M1990 Unspecified osteoarthritis, unspecified site: Secondary | ICD-10-CM

## 2015-04-14 DIAGNOSIS — M47812 Spondylosis without myelopathy or radiculopathy, cervical region: Secondary | ICD-10-CM | POA: Diagnosis not present

## 2015-04-14 MED ORDER — FLUTICASONE PROPIONATE 50 MCG/ACT NA SUSP: 2.0000 | Freq: Every day | NASAL | Status: DC

## 2015-04-14 MED ORDER — FEXOFENADINE HCL 180 MG PO TABS: 180.0000 mg | ORAL_TABLET | Freq: Every day | ORAL | Status: DC

## 2015-04-14 NOTE — Progress Notes (Signed)
Patient ID: Evelyn [name], female    DOB: Apr 22, 1930  Age: 81 y.o. MRN: [id number]    Subjective:  Subjective HPI Evelyn [name] presents for c/o pressure on top of head that comes and goes since march.    Allegra seems to help.   Pt also c/o loose stools.  Pt is not eating much fiber and taking nothing otc for loose stools.     Review of Systems  Constitutional: Positive for fatigue. Negative for activity change, appetite change and unexpected weight change.  HENT: Positive for congestion, nosebleeds, postnasal drip, rhinorrhea, sinus pressure and sneezing.   Eyes: Positive for itching. Negative for redness.  Respiratory: Negative for cough and shortness of breath.   Cardiovascular: Negative for chest pain and palpitations.  Gastrointestinal: Positive for diarrhea. Negative for nausea, vomiting, abdominal pain, constipation and blood in stool.  Neurological: Positive for headaches. Negative for dizziness.  Psychiatric/Behavioral: Negative for behavioral problems and dysphoric mood. The patient is not nervous/anxious.     History Past Medical History  Diagnosis Date  . Hypertension   . Osteoporosis   . Hyperlipidemia   . Post-menopausal     HRT    She has past surgical history that includes Abdominal hysterectomy and Breast lumpectomy (1975).   Her family history includes Arthritis in her mother and sister; COPD in her father and sister; Glaucoma in her father; Heart attack (age of onset: 28) in her father; Heart disease in her mother; Hypertension in her sister; Prostate cancer in her father.She reports that she quit smoking about 42 years ago. Her smoking use included Cigarettes. She has a 13.5 pack-year smoking history. She has never used smokeless tobacco. She reports that she drinks alcohol. She reports that she does not use illicit drugs.  Current Outpatient Prescriptions on File Prior to Visit  Medication Sig Dispense Refill  . aspirin 81 MG tablet Take 81 mg by mouth daily.       Marland Kitchen lisinopril (PRINIVIL,ZESTRIL) 40 MG tablet Take 1 tablet (40 mg total) by mouth daily. 90 tablet 3  . metoprolol succinate (TOPROL-XL) 50 MG 24 hr tablet Take 1 tablet (50 mg total) by mouth daily. 90 tablet 3  . Ascorbic Acid (VITAMIN C PO) Take 1 tablet by mouth daily. Pt unsure of how many mgs.    . calcium carbonate (OS-CAL) 600 MG TABS Take 600 mg by mouth daily.      . Cholecalciferol (VITAMIN D PO) Take 1 capsule by mouth daily. Pt unsure of mgs.    Marland Kitchen glucosamine-chondroitin 500-400 MG tablet Take 1 tablet by mouth 3 (three) times daily.    Marland Kitchen latanoprost (XALATAN) 0.005 % ophthalmic solution Place 1 drop into both eyes at bedtime.      . Multiple Vitamin (MULTIVITAMIN) tablet Take 1 tablet by mouth daily.      . Red Yeast Rice 600 MG TABS Take 1 tablet by mouth daily.       No current facility-administered medications on file prior to visit.     Objective:  Objective Physical Exam  Constitutional: She is oriented to person, place, and time. She appears well-developed and well-nourished.  HENT:  Head: Normocephalic and atraumatic.  Eyes: Conjunctivae and EOM are normal.  Neck: Normal range of motion. Neck supple. No JVD present. Carotid bruit is not present. No thyromegaly present.  Cardiovascular: Normal rate, regular rhythm and normal heart sounds.   No murmur heard. Pulmonary/Chest: Effort normal and breath sounds normal. No respiratory distress. She has  no wheezes. She has no rales. She exhibits no tenderness.  Abdominal: Soft. She exhibits no distension.  Musculoskeletal: She exhibits no edema.  Neurological: She is alert and oriented to person, place, and time.  Psychiatric: She has a normal mood and affect. Her behavior is normal.   BP 112/66 mmHg  Pulse 65  Temp(Src) 98.1 F (36.7 C) (Oral)  Resp 16  Ht 5\' 1"  (1.549 m)  Wt 133 lb (60.328 kg)  BMI 25.14 kg/m2  SpO2 98% Wt Readings from Last 3 Encounters:  04/14/15 133 lb (60.328 kg)  02/26/15 130 lb 12.8 oz  (59.33 kg)  12/30/14 134 lb 6.4 oz (60.963 kg)     Lab Results  Component Value Date   WBC 6.9 01/28/2013   HGB 13.8 01/28/2013   HCT 41.2 01/28/2013   PLT 196.0 01/28/2013   GLUCOSE 87 06/17/2013   CHOL 210* 03/26/2013   TRIG 94.0 03/26/2013   HDL 70.30 03/26/2013   LDLDIRECT 130.5 03/26/2013   LDLCALC 85 07/14/2011   ALT 20 01/28/2013   AST 18 01/28/2013   NA 140 06/17/2013   K 3.7 06/17/2013   CL 105 06/17/2013   CREATININE 0.7 06/17/2013   BUN 12 06/17/2013   CO2 28 06/17/2013   TSH 2.85 01/28/2013    Dg Cervical Spine 2-3 Views  07/14/2011   *RADIOLOGY REPORT*  Clinical Data: Neck pain and stiffness, worse in the morning  CERVICAL SPINE - 2-3 VIEW  Comparison: None.  Findings:  C1 to the inferior endplate of T2 is visualized on the lateral radiograph.  There is reversal of the expected cervical lordosis with kyphosis centered about the C5 - C6 intervertebral disc space. There is minimal (1-2 mm) of anterolisthesis of C7 upon T1.  There is mild (25%) age indeterminate anterior height loss of C6 vertebral body. Prevertebral soft tissues are normal.  Moderate to severe multilevel DDD, worse at C5 - C6 and C6 - C7 and to a lesser extent, C3 - C4 and C4 - C5.  Normal atlantodental and atlantoaxial articulations.  There is mild rightward deviation of the dens between the lateral masses of C1, possibly positional. Regional soft tissues are normal.  Limited visualization of the lung apices demonstrates atherosclerotic calcifications within the aortic arch.  IMPRESSION:  Moderate to severe multilevel DDD, worse at C5 - C6 and C6 - C7 and to a lesser extent, C3 - C4 and C4 - C5.  Original Report Authenticated By: Evelyn [name], M.D.    Assessment & Plan:  Plan I have discontinued Ms. Evelyn [name]'s fexofenadine. I am also having her start on fexofenadine and fluticasone. Additionally, I am having her maintain her latanoprost, aspirin, calcium carbonate, Red Yeast Rice, multivitamin,  Cholecalciferol (VITAMIN D PO), Ascorbic Acid (VITAMIN C PO), glucosamine-chondroitin, lisinopril, and metoprolol succinate.  Meds ordered this encounter  Medications  . fexofenadine (ALLEGRA) 180 MG tablet    Sig: Take 1 tablet (180 mg total) by mouth daily.  . fluticasone (FLONASE) 50 MCG/ACT nasal spray    Sig: Place 2 sprays into both nostrils daily.    Dispense:  16 g    Refill:  6    Problem List Items Addressed This Visit    Osteoarthritis    con't otc meds      Loose stools    Increase fiber in diet Align or something like it rto prn       Other Visit Diagnoses    Seasonal allergies    -  Primary  Relevant Medications    fexofenadine (ALLEGRA) 180 MG tablet    fluticasone (FLONASE) 50 MCG/ACT nasal spray    Osteoarthritis cervical spine           Follow-up: Return if symptoms worsen or fail to improve.  Garnet Koyanagi, DO

## 2015-04-14 NOTE — Assessment & Plan Note (Signed)
con't otc meds

## 2015-04-14 NOTE — Patient Instructions (Addendum)
Also try Align or something like it   High-Fiber Diet Fiber is found in fruits, vegetables, and grains. A high-fiber diet encourages the addition of more whole grains, legumes, fruits, and vegetables in your diet. The recommended amount of fiber for adult males is 38 g per day. For adult females, it is 25 g per day. Pregnant and lactating women should get 28 g of fiber per day. If you have a digestive or bowel problem, ask your caregiver for advice before adding high-fiber foods to your diet. Eat a variety of high-fiber foods instead of only a select few type of foods.  PURPOSE  To increase stool bulk.  To make bowel movements more regular to prevent constipation.  To lower cholesterol.  To prevent overeating. WHEN IS THIS DIET USED?  It may be used if you have constipation and hemorrhoids.  It may be used if you have uncomplicated diverticulosis (intestine condition) and irritable bowel syndrome.  It may be used if you need help with weight management.  It may be used if you want to add it to your diet as a protective measure against atherosclerosis, diabetes, and cancer. SOURCES OF FIBER  Whole-grain breads and cereals.  Fruits, such as apples, oranges, bananas, berries, prunes, and pears.  Vegetables, such as green peas, carrots, sweet potatoes, beets, broccoli, cabbage, spinach, and artichokes.  Legumes, such split peas, soy, lentils.  Almonds. FIBER CONTENT IN FOODS Starches and Grains / Dietary Fiber (g)  Cheerios, 1 cup / 3 g  Corn Flakes cereal, 1 cup / 0.7 g  Rice crispy treat cereal, 1 cup / 0.3 g  Instant oatmeal (cooked),  cup / 2 g  Frosted wheat cereal, 1 cup / 5.1 g  Brown, long-grain rice (cooked), 1 cup / 3.5 g  White, long-grain rice (cooked), 1 cup / 0.6 g  Enriched macaroni (cooked), 1 cup / 2.5 g Legumes / Dietary Fiber (g)  Baked beans (canned, plain, or vegetarian),  cup / 5.2 g  Kidney beans (canned),  cup / 6.8 g  Pinto beans  (cooked),  cup / 5.5 g Breads and Crackers / Dietary Fiber (g)  Plain or honey graham crackers, 2 squares / 0.7 g  Saltine crackers, 3 squares / 0.3 g  Plain, salted pretzels, 10 pieces / 1.8 g  Whole-wheat bread, 1 slice / 1.9 g  White bread, 1 slice / 0.7 g  Raisin bread, 1 slice / 1.2 g  Plain bagel, 3 oz / 2 g  Flour tortilla, 1 oz / 0.9 g  Corn tortilla, 1 small / 1.5 g  Hamburger or hotdog bun, 1 small / 0.9 g Fruits / Dietary Fiber (g)  Apple with skin, 1 medium / 4.4 g  Sweetened applesauce,  cup / 1.5 g  Banana,  medium / 1.5 g  Grapes, 10 grapes / 0.4 g  Orange, 1 small / 2.3 g  Raisin, 1.5 oz / 1.6 g  Melon, 1 cup / 1.4 g Vegetables / Dietary Fiber (g)  Green beans (canned),  cup / 1.3 g  Carrots (cooked),  cup / 2.3 g  Broccoli (cooked),  cup / 2.8 g  Peas (cooked),  cup / 4.4 g  Mashed potatoes,  cup / 1.6 g  Lettuce, 1 cup / 0.5 g  Corn (canned),  cup / 1.6 g  Tomato,  cup / 1.1 g Document Released: 11/14/2005 Document Revised: 05/15/2012 Document Reviewed: 02/16/2012 [employer] Patient Information 2015 [place], [place]. This information is not intended to replace  advice given to you by your health care provider. Make sure you discuss any questions you have with your health care provider.

## 2015-04-14 NOTE — Assessment & Plan Note (Signed)
Increase fiber in diet Align or something like it rto prn

## 2015-04-22 ENCOUNTER — Telehealth: Payer: Self-pay | Admitting: Family Medicine

## 2015-04-22 NOTE — Telephone Encounter (Signed)
Relation to pt: self  Call back number: [phone]   Reason for call:  Pt does not want to take lisinopril (PRINIVIL,ZESTRIL) 40 MG tablet experiencing many different symptoms. Pt read a lot derogatory reviews online and requesting an alternate medication. Please advise

## 2015-04-22 NOTE — Telephone Encounter (Signed)
Left message on voicemail to return my call. Wanted to confirm side effects that pt has had.

## 2015-04-23 NOTE — Telephone Encounter (Signed)
Please advise      KP 

## 2015-04-23 NOTE — Telephone Encounter (Signed)
Spoke with patient and she stated that her BP is not elevated, she said she is having some of the listed symptoms and does not want to take the medication anymore. She has made the choice and she would like for you to replace it. Please advise     KP

## 2015-04-23 NOTE — Telephone Encounter (Signed)
Patient states that she is having pressure in her head, "crick" in her neck. She states that she has looked online about lisinopril and does not want to be on this any longer. Would like to be on something else.

## 2015-04-23 NOTE — Telephone Encounter (Signed)
Pt needs ov---may be from pressure being high

## 2015-04-24 NOTE — Telephone Encounter (Signed)
Cozaar 50 mg 1 po qd #30  2 refills Ov in 2 weeks

## 2015-04-24 NOTE — Telephone Encounter (Signed)
Msg left to call the office     KP 

## 2015-04-28 MED ORDER — LOSARTAN POTASSIUM 50 MG PO TABS: 50.0000 mg | ORAL_TABLET | Freq: Every day | ORAL | Status: DC

## 2015-04-28 NOTE — Telephone Encounter (Signed)
Patient returned phone call. Best # [phone]

## 2015-04-28 NOTE — Telephone Encounter (Signed)
Spoke with patient and she has agreed to take the Cozaar and she has scheduled a follow up for 05/19/15 at 2:45.    KP

## 2015-04-30 ENCOUNTER — Telehealth: Payer: Self-pay | Admitting: Family Medicine

## 2015-04-30 NOTE — Telephone Encounter (Signed)
no

## 2015-04-30 NOTE — Telephone Encounter (Signed)
Pt was no show for follow up on 5/12. Rescheduled for 6/21- charge?

## 2015-05-19 ENCOUNTER — Encounter: Payer: Self-pay | Admitting: Family Medicine

## 2015-05-19 ENCOUNTER — Ambulatory Visit (INDEPENDENT_AMBULATORY_CARE_PROVIDER_SITE_OTHER): Payer: Medicare Other | Admitting: Family Medicine

## 2015-05-19 ENCOUNTER — Ambulatory Visit (HOSPITAL_BASED_OUTPATIENT_CLINIC_OR_DEPARTMENT_OTHER)
Admission: RE | Admit: 2015-05-19 | Discharge: 2015-05-19 | Disposition: A | Discharge status: Home / Self Care | Payer: Medicare Other | Source: Ambulatory Visit | Attending: Family Medicine | Admitting: Family Medicine

## 2015-05-19 VITALS — BP 130/78 | HR 67 | Temp 98.7°F | Resp 18 | Ht 61.0 in | Wt 135.0 lb

## 2015-05-19 DIAGNOSIS — M1990 Unspecified osteoarthritis, unspecified site: Secondary | ICD-10-CM | POA: Diagnosis not present

## 2015-05-19 DIAGNOSIS — M542 Cervicalgia: Secondary | ICD-10-CM

## 2015-05-19 DIAGNOSIS — I10 Essential (primary) hypertension: Secondary | ICD-10-CM

## 2015-05-19 DIAGNOSIS — R195 Other fecal abnormalities: Secondary | ICD-10-CM | POA: Diagnosis not present

## 2015-05-19 DIAGNOSIS — M47892 Other spondylosis, cervical region: Secondary | ICD-10-CM | POA: Diagnosis not present

## 2015-05-19 MED ORDER — METOPROLOL SUCCINATE ER 50 MG PO TB24
50.0000 mg | ORAL_TABLET | Freq: Every day | ORAL | Status: DC
Start: 2015-05-19 — End: 2016-03-31

## 2015-05-19 MED ORDER — LOSARTAN POTASSIUM 50 MG PO TABS: 50.0000 mg | ORAL_TABLET | Freq: Every day | ORAL | Status: DC

## 2015-05-19 NOTE — Assessment & Plan Note (Signed)
Neck pain worsening Recheck xray

## 2015-05-19 NOTE — Assessment & Plan Note (Signed)
Probiotic Pt refusing GI referral or testing

## 2015-05-19 NOTE — Patient Instructions (Addendum)
Get a probiotic for your loose stool --- if it does not improve ---you may need a GI referral   Hypertension Hypertension, commonly called high blood pressure, is when the force of blood pumping through your arteries is too strong. Your arteries are the blood vessels that carry blood from your heart throughout your body. A blood pressure reading consists of a higher number over a lower number, such as 110/72. The higher number (systolic) is the pressure inside your arteries when your heart pumps. The lower number (diastolic) is the pressure inside your arteries when your heart relaxes. Ideally you want your blood pressure below 120/80. Hypertension forces your heart to work harder to pump blood. Your arteries may become narrow or stiff. Having hypertension puts you at risk for heart disease, stroke, and other problems.  RISK FACTORS Some risk factors for high blood pressure are controllable. Others are not.  Risk factors you cannot control include:   Race. You may be at higher risk if you are African American.  Age. Risk increases with age.  Gender. Men are at higher risk than women before age 80 years. After age 51, women are at higher risk than men. Risk factors you can control include:  Not getting enough exercise or physical activity.  Being overweight.  Getting too much fat, sugar, calories, or salt in your diet.  Drinking too much alcohol. SIGNS AND SYMPTOMS Hypertension does not usually cause signs or symptoms. Extremely high blood pressure (hypertensive crisis) may cause headache, anxiety, shortness of breath, and nosebleed. DIAGNOSIS  To check if you have hypertension, your health care provider will measure your blood pressure while you are seated, with your arm held at the level of your heart. It should be measured at least twice using the same arm. Certain conditions can cause a difference in blood pressure between your right and left arms. A blood pressure reading that is  higher than normal on one occasion does not mean that you need treatment. If one blood pressure reading is high, ask your health care provider about having it checked again. TREATMENT  Treating high blood pressure includes making lifestyle changes and possibly taking medicine. Living a healthy lifestyle can help lower high blood pressure. You may need to change some of your habits. Lifestyle changes may include:  Following the DASH diet. This diet is high in fruits, vegetables, and whole grains. It is low in salt, red meat, and added sugars.  Getting at least 2 hours of brisk physical activity every week.  Losing weight if necessary.  Not smoking.  Limiting alcoholic beverages.  Learning ways to reduce stress. If lifestyle changes are not enough to get your blood pressure under control, your health care provider may prescribe medicine. You may need to take more than one. Work closely with your health care provider to understand the risks and benefits. HOME CARE INSTRUCTIONS  Have your blood pressure rechecked as directed by your health care provider.   Take medicines only as directed by your health care provider. Follow the directions carefully. Blood pressure medicines must be taken as prescribed. The medicine does not work as well when you skip doses. Skipping doses also puts you at risk for problems.   Do not smoke.   Monitor your blood pressure at home as directed by your health care provider. SEEK MEDICAL CARE IF:   You think you are having a reaction to medicines taken.  You have recurrent headaches or feel dizzy.  You have swelling in your  ankles.  You have trouble with your vision. SEEK IMMEDIATE MEDICAL CARE IF:  You develop a severe headache or confusion.  You have unusual weakness, numbness, or feel faint.  You have severe chest or abdominal pain.  You vomit repeatedly.  You have trouble breathing. MAKE SURE YOU:   Understand these  instructions.  Will watch your condition.  Will get help right away if you are not doing well or get worse. Document Released: 11/14/2005 Document Revised: 03/31/2014 Document Reviewed: 09/06/2013 [facility] Patient Information 2015 [place], [place]. This information is not intended to replace advice given to you by your health care provider. Make sure you discuss any questions you have with your health care provider.

## 2015-05-19 NOTE — Progress Notes (Signed)
Pre visit review using our clinic review tool, if applicable. No additional management support is needed unless otherwise documented below in the visit note. 

## 2015-05-19 NOTE — Progress Notes (Signed)
Patient ID: Evelyn [name], female    DOB: 12/26/1929  Age: 81 y.o. MRN: [id number]    Subjective:  Subjective HPI Evelyn [name] presents for f/u bp.  See home bps.   She is still c/o neck pain and loose stools.   Review of Systems  Constitutional: Negative for activity change, appetite change, fatigue and unexpected weight change.  Respiratory: Negative for cough and shortness of breath.   Cardiovascular: Negative for chest pain and palpitations.  Gastrointestinal: Positive for diarrhea. Negative for nausea, abdominal pain, constipation, blood in stool, abdominal distention and rectal pain.  Musculoskeletal: Positive for arthralgias and neck pain.  Neurological: Negative for syncope and headaches.  Psychiatric/Behavioral: Negative for behavioral problems and dysphoric mood. The patient is not nervous/anxious.     History Past Medical History  Diagnosis Date  . Hypertension   . Osteoporosis   . Hyperlipidemia   . Post-menopausal     HRT    She has past surgical history that includes Abdominal hysterectomy and Breast lumpectomy (1975).   Her family history includes Arthritis in her mother and sister; COPD in her father and sister; Glaucoma in her father; Heart attack (age of onset: 31) in her father; Heart disease in her mother; Hypertension in her sister; Prostate cancer in her father.She reports that she quit smoking about 42 years ago. Her smoking use included Cigarettes. She has a 13.5 pack-year smoking history. She has never used smokeless tobacco. She reports that she drinks alcohol. She reports that she does not use illicit drugs.  Current Outpatient Prescriptions on File Prior to Visit  Medication Sig Dispense Refill  . Ascorbic Acid (VITAMIN C PO) Take 1 tablet by mouth daily. Pt unsure of how many mgs.    Marland Kitchen aspirin 81 MG tablet Take 81 mg by mouth daily.      . calcium carbonate (OS-CAL) 600 MG TABS Take 600 mg by mouth daily.      . Cholecalciferol (VITAMIN D PO) Take 1  capsule by mouth daily. Pt unsure of mgs.    . fexofenadine (ALLEGRA) 180 MG tablet Take 1 tablet (180 mg total) by mouth daily.    . fluticasone (FLONASE) 50 MCG/ACT nasal spray Place 2 sprays into both nostrils daily. 16 g 6  . glucosamine-chondroitin 500-400 MG tablet Take 1 tablet by mouth 3 (three) times daily.    Marland Kitchen latanoprost (XALATAN) 0.005 % ophthalmic solution Place 1 drop into both eyes at bedtime.      . Multiple Vitamin (MULTIVITAMIN) tablet Take 1 tablet by mouth daily.      . Red Yeast Rice 600 MG TABS Take 1 tablet by mouth daily.       No current facility-administered medications on file prior to visit.     Objective:  Objective Physical Exam  Constitutional: She is oriented to person, place, and time. She appears well-developed and well-nourished.  HENT:  Head: Normocephalic and atraumatic.  Eyes: Conjunctivae and EOM are normal.  Neck: Normal range of motion. Neck supple. No JVD present. Carotid bruit is not present. No thyromegaly present.  Cardiovascular: Normal rate, regular rhythm and normal heart sounds.   No murmur heard. Pulmonary/Chest: Effort normal and breath sounds normal. No respiratory distress. She has no wheezes. She has no rales. She exhibits no tenderness.  Musculoskeletal: She exhibits no edema or tenderness.  Neurological: She is alert and oriented to person, place, and time. She displays normal reflexes. No cranial nerve deficit. She exhibits normal muscle tone. Coordination  normal.  Psychiatric: She has a normal mood and affect. Her behavior is normal.   BP 130/78 mmHg  Pulse 67  Temp(Src) 98.7 F (37.1 C) (Oral)  Resp 18  Ht 5\' 1"  (1.549 m)  Wt 135 lb (61.236 kg)  BMI 25.52 kg/m2  SpO2 98% Wt Readings from Last 3 Encounters:  05/19/15 135 lb (61.236 kg)  04/14/15 133 lb (60.328 kg)  02/26/15 130 lb 12.8 oz (59.33 kg)     Lab Results  Component Value Date   WBC 6.9 01/28/2013   HGB 13.8 01/28/2013   HCT 41.2 01/28/2013   PLT 196.0  01/28/2013   GLUCOSE 87 06/17/2013   CHOL 210* 03/26/2013   TRIG 94.0 03/26/2013   HDL 70.30 03/26/2013   LDLDIRECT 130.5 03/26/2013   LDLCALC 85 07/14/2011   ALT 20 01/28/2013   AST 18 01/28/2013   NA 140 06/17/2013   K 3.7 06/17/2013   CL 105 06/17/2013   CREATININE 0.7 06/17/2013   BUN 12 06/17/2013   CO2 28 06/17/2013   TSH 2.85 01/28/2013    Dg Cervical Spine 2-3 Views  07/14/2011   *RADIOLOGY REPORT*  Clinical Data: Neck pain and stiffness, worse in the morning  CERVICAL SPINE - 2-3 VIEW  Comparison: None.  Findings:  C1 to the inferior endplate of T2 is visualized on the lateral radiograph.  There is reversal of the expected cervical lordosis with kyphosis centered about the C5 - C6 intervertebral disc space. There is minimal (1-2 mm) of anterolisthesis of C7 upon T1.  There is mild (25%) age indeterminate anterior height loss of C6 vertebral body. Prevertebral soft tissues are normal.  Moderate to severe multilevel DDD, worse at C5 - C6 and C6 - C7 and to a lesser extent, C3 - C4 and C4 - C5.  Normal atlantodental and atlantoaxial articulations.  There is mild rightward deviation of the dens between the lateral masses of C1, possibly positional. Regional soft tissues are normal.  Limited visualization of the lung apices demonstrates atherosclerotic calcifications within the aortic arch.  IMPRESSION:  Moderate to severe multilevel DDD, worse at C5 - C6 and C6 - C7 and to a lesser extent, C3 - C4 and C4 - C5.  Original Report Authenticated By: Rachel Moulds, M.D.    Assessment & Plan:  Plan I am having [name] maintain her latanoprost, aspirin, calcium carbonate, Red Yeast Rice, multivitamin, Cholecalciferol (VITAMIN D PO), Ascorbic Acid (VITAMIN C PO), glucosamine-chondroitin, fexofenadine, fluticasone, metoprolol succinate, and losartan.  Meds ordered this encounter  Medications  . metoprolol succinate (TOPROL-XL) 50 MG 24 hr tablet    Sig: Take 1 tablet (50 mg total) by mouth  daily.    Dispense:  90 tablet    Refill:  3  . losartan (COZAAR) 50 MG tablet    Sig: Take 1 tablet (50 mg total) by mouth daily.    Dispense:  90 tablet    Refill:  3    Problem List Items Addressed This Visit    Osteoarthritis    Neck pain worsening Recheck xray      Loose stools    Probiotic Pt refusing GI referral or testing      Essential hypertension   Relevant Medications   metoprolol succinate (TOPROL-XL) 50 MG 24 hr tablet   losartan (COZAAR) 50 MG tablet    Other Visit Diagnoses    Neck pain    -  Primary    Relevant Orders    DG Cervical  Spine Complete       Follow-up: Return in about 6 months (around 11/18/2015) for annual exam, fasting.  Garnet Koyanagi, DO

## 2015-06-25 ENCOUNTER — Telehealth: Payer: Self-pay | Admitting: Family Medicine

## 2015-06-25 DIAGNOSIS — M542 Cervicalgia: Secondary | ICD-10-CM

## 2015-06-25 NOTE — Telephone Encounter (Signed)
Ok to refer to ortho 

## 2015-06-25 NOTE — Telephone Encounter (Signed)
Please advise      KP 

## 2015-06-25 NOTE — Telephone Encounter (Signed)
Relation to pt: self  Call back number: [phone]   Reason for call:  Patient states if right side of neck and head pressure persist PCP would refer to orthopedic

## 2015-08-20 ENCOUNTER — Telehealth: Payer: Self-pay

## 2015-08-20 NOTE — Telephone Encounter (Signed)
Unable to reach patient or leave a voicemail. Will try again later.

## 2015-10-01 ENCOUNTER — Ambulatory Visit (INDEPENDENT_AMBULATORY_CARE_PROVIDER_SITE_OTHER): Payer: Medicare Other | Admitting: Behavioral Health

## 2015-10-01 DIAGNOSIS — Z23 Encounter for immunization: Secondary | ICD-10-CM

## 2015-10-01 NOTE — Progress Notes (Signed)
Pre visit review using our clinic review tool, if applicable. No additional management support is needed unless otherwise documented below in the visit note. 

## 2015-11-11 ENCOUNTER — Ambulatory Visit (INDEPENDENT_AMBULATORY_CARE_PROVIDER_SITE_OTHER): Payer: Medicare Other | Admitting: Medical

## 2015-11-11 ENCOUNTER — Encounter: Payer: Self-pay | Admitting: Medical

## 2015-11-11 ENCOUNTER — Ambulatory Visit (HOSPITAL_BASED_OUTPATIENT_CLINIC_OR_DEPARTMENT_OTHER)
Admission: RE | Admit: 2015-11-11 | Discharge: 2015-11-11 | Disposition: A | Discharge status: Home / Self Care | Payer: Medicare Other | Source: Ambulatory Visit | Attending: Medical | Admitting: Medical

## 2015-11-11 VITALS — BP 120/80 | HR 88 | Temp 98.1°F | Ht 61.0 in | Wt 136.0 lb

## 2015-11-11 DIAGNOSIS — M542 Cervicalgia: Secondary | ICD-10-CM | POA: Diagnosis not present

## 2015-11-11 DIAGNOSIS — M25512 Pain in left shoulder: Secondary | ICD-10-CM | POA: Insufficient documentation

## 2015-11-11 NOTE — Patient Instructions (Addendum)
For your neck pain, will refer to PT at your request. Some of pain is likely muscle and some djd by xray review.  For you left shoulder pain on abduction and palpation, Will get xray and refer to PT as well. You declined ortho referral but you may need to see them to evaluate your rotator cuff.  Continue tylenol and can supplement low dose 200 mg ibuprofen every 8 hours if needed.  Follow up in 2-3 wks or as needed

## 2015-11-11 NOTE — Progress Notes (Signed)
Pre visit review using our clinic review tool, if applicable. No additional management support is needed unless otherwise documented below in the visit note. 

## 2015-11-11 NOTE — Progress Notes (Signed)
Subjective:    Patient ID: Evelyn [name], female    DOB: [birth year], 81 y.o.   MRN: [id number]  HPI  Pt in for left shoulder pain. Came on 2 wks ago. She attrirbuted to blowing leaves. She was blowing leaves. She was blowing leaves day before. Pt also has been doing some  Neck exercises that ortho pa gave her.  Pt states when she lifts her arm, writes xmas cards, eating, fixing hair will cause her pain.  No elbow pain. No hand pain. No chest pain, jaw pain and no sob. Pt is left handed.  Pt has been taking tylenol and it does help.  Pt has good kidney function.  No gastric ulcer or gastritis history.  Hx of intermittent trapezius/neck pain since march. Has a lot of mulilevel degenerative disk disease   Review of Systems  Constitutional: Negative for fever, chills and fatigue.  Respiratory: Negative for cough, shortness of breath and wheezing.   Cardiovascular: Negative for chest pain and palpitations.  Gastrointestinal: Negative for abdominal pain.  Musculoskeletal:       Lt shoulder- see hpi and physical exam.  Neurological: Negative for dizziness and headaches.  Hematological: Negative for adenopathy. Does not bruise/bleed easily.    Past Medical History  Diagnosis Date  . Hypertension   . Osteoporosis   . Hyperlipidemia   . Post-menopausal     HRT    Social History   Social History  . Marital Status: Widowed    Spouse Name: N/A  . Number of Children: N/A  . Years of Education: N/A   Occupational History  . Not on file.   Social History Main Topics  . Smoking status: Former Smoker -- 0.75 packs/day for 18 years    Types: Cigarettes    Quit date: 02/20/1973  . Smokeless tobacco: Never Used  . Alcohol Use: Yes  . Drug Use: No  . Sexual Activity:    Partners: Male   Other Topics Concern  . Not on file   Social History Narrative   Exercise--no    Past Surgical History  Procedure Laterality Date  . Abdominal hysterectomy    . Breast lumpectomy  1975     Family History  Problem Relation Age of Onset  . Arthritis Mother   . Heart disease Mother   . Heart attack Father 43  . Prostate cancer Father   . COPD Father   . Glaucoma Father   . Hypertension Sister   . COPD Sister   . Arthritis Sister     Allergies  Allergen Reactions  . Vicodin [Hydrocodone-Acetaminophen] Other (See Comments)    Abdominal Pain  . Pletaal [Cilostazol] Diarrhea    Current Outpatient Prescriptions on File Prior to Visit  Medication Sig Dispense Refill  . Ascorbic Acid (VITAMIN C PO) Take 1 tablet by mouth daily. Pt unsure of how many mgs.    Marland Kitchen aspirin 81 MG tablet Take 81 mg by mouth daily.      . calcium carbonate (OS-CAL) 600 MG TABS Take 600 mg by mouth daily.      . Cholecalciferol (VITAMIN D PO) Take 1 capsule by mouth daily. Pt unsure of mgs.    . fexofenadine (ALLEGRA) 180 MG tablet Take 1 tablet (180 mg total) by mouth daily.    . fluticasone (FLONASE) 50 MCG/ACT nasal spray Place 2 sprays into both nostrils daily. 16 g 6  . glucosamine-chondroitin 500-400 MG tablet Take 1 tablet by mouth 3 (three) times daily.    Marland Kitchen  latanoprost (XALATAN) 0.005 % ophthalmic solution Place 1 drop into both eyes at bedtime.      Marland Kitchen losartan (COZAAR) 50 MG tablet Take 1 tablet (50 mg total) by mouth daily. 90 tablet 3  . metoprolol succinate (TOPROL-XL) 50 MG 24 hr tablet Take 1 tablet (50 mg total) by mouth daily. 90 tablet 3  . Multiple Vitamin (MULTIVITAMIN) tablet Take 1 tablet by mouth daily.      . Red Yeast Rice 600 MG TABS Take 1 tablet by mouth daily.       No current facility-administered medications on file prior to visit.    BP 120/80 mmHg  Pulse 88  Temp(Src) 98.1 F (36.7 C) (Oral)  Ht 5\' 1"  (1.549 m)  Wt 136 lb (61.689 kg)  BMI 25.71 kg/m2  SpO2 98%       Objective:   Physical Exam  General- No acute distress. Pleasant patient. Neck- Full range of motion, no jvd Lungs- Clear, even and unlabored. Heart- regular rate and  rhythm. Neurologic- CNII- XII grossly intact.  Lt shoulder- anterior aspect tender to palpation over bicep tendon. On abduction pain increased. Minimal pain. No crepitus.      Assessment & Plan:  For your neck pain, will refer to PT at your request. Some of pain is likely muscle and some djd by xray review.  For you left shoulder pain on abduction and palpation, Will get xray and refer to PT as well. You declined ortho referral but you may need to see them to evaluate your rotator cuff.  Continue tylenol and can supplement low dose 200 mg ibuprofen every 8 hours if needed.  Follow up in 2-3 wks or as needed  Discussed no chest pain, no dyspnea, arm pain, or jaw pain etc. If any such occurs then advised ED eval

## 2015-12-08 ENCOUNTER — Telehealth: Payer: Self-pay | Admitting: Family Medicine

## 2015-12-08 ENCOUNTER — Encounter: Payer: Medicare Other | Admitting: Family Medicine

## 2015-12-08 NOTE — Telephone Encounter (Signed)
[place], [place]     Phone Number: [phone]            Patient phone is busy and will try again.       Previous Messages     ----- Message -----   From: Ewing Schlein, CMA   Sent: 12/07/2015  3:43 PM    To: Oneta Rack  Subject: RE: physical appointment             She can get a 15 minute apt and keep the CPE for May  ----- Message -----   From: Oneta Rack   Sent: 12/07/2015  3:39 PM    To: Ewing Schlein, CMA  Subject: physical appointment               Patient had to [facility] her physical for yesterday and scheduled to May she says its ok but she does have some things she would like to speak with Dr. Lilian Coma i schedule in a Hospital follow up slot?

## 2015-12-29 ENCOUNTER — Encounter: Payer: Self-pay | Admitting: Physical Therapy

## 2015-12-29 ENCOUNTER — Ambulatory Visit: Payer: Medicare Other | Attending: Family Medicine | Admitting: Physical Therapy

## 2015-12-29 DIAGNOSIS — R29898 Other symptoms and signs involving the musculoskeletal system: Secondary | ICD-10-CM

## 2015-12-29 DIAGNOSIS — M542 Cervicalgia: Secondary | ICD-10-CM | POA: Insufficient documentation

## 2015-12-29 NOTE — Therapy (Signed)
[address] [place], [place], [address] Phone: [phone]   Fax:  [phone]  Physical Therapy Evaluation  Patient Details  Name: Evelyn [name] MRN: [id number] Date of Birth: 04-05-1930 Referring Provider: Harvie Heck  Encounter Date: 12/29/2015      PT End of Session - 12/29/15 1403    Visit Number 1   Date for PT Re-Evaluation 02/26/16   PT Start Time [id number]   PT Stop Time 1415   PT Time Calculation (min) 67 min   Activity Tolerance Patient tolerated treatment well   Behavior During Therapy [facility] for tasks assessed/performed      Past Medical History  Diagnosis Date  . Hypertension   . Osteoporosis   . Hyperlipidemia   . Post-menopausal     HRT    Past Surgical History  Procedure Laterality Date  . Abdominal hysterectomy    . Breast lumpectomy  1975    There were no vitals filed for this visit.  Visit Diagnosis:  Cervical pain  Decreased range of motion of neck      Subjective Assessment - 12/29/15 1310    Subjective Pt states she has pain and stiffness in neck since March of 2016. Pt states she is also feeling pressure in her head. Pt states 5 weeks ago she began to have some achiness and weakness in left shoulder and upper arm.    Limitations Writing;Reading  difficulty with yardwork, uses neck pillow for reading in bed   Patient Stated Goals get back to normal routine   Currently in Pain? Yes   Pain Score 8    Pain Location Neck  goes into head as well   Pain Orientation Right   Pain Descriptors / Indicators Pressure   Pain Type Chronic pain   Pain Onset More than a month ago   Pain Frequency Intermittent   Aggravating Factors  bending over for yardwork and standing a lot   Pain Relieving Factors pain meds, changing position, heat, massage   Effect of Pain on Daily Activities difficulty with yardwork            [facility] PT Assessment - 12/29/15 0001    Assessment   Medical  Diagnosis neck pain   Referring Provider [place]   Onset Date/Surgical Date 02/10/15   Hand Dominance Left   Prior Therapy none   Precautions   Precautions None   Balance Screen   Has the patient fallen in the past 6 months No   Has the patient had a decrease in activity level because of a fear of falling?  Yes   Is the patient reluctant to leave their home because of a fear of falling?  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, housework   ROM / Strength   AROM / PROM / Strength AROM;Strength   AROM   AROM Assessment Site Cervical   Cervical Flexion 5% limited   Cervical Extension 50% limited   Cervical - Right Side Bend 50% limited   Cervical - Left Side Bend 50% limited   Cervical - Right Rotation 75% limited   Cervical - Left Rotation 75% limited  Strength   Overall Strength Comments left shoulder flexion 3+/5, right shoulder flexion 3+/5, left elbow flexion  4/5, right elbow flexion 4+/5, left elbow extension 4/5, right elbow extension 5/5   Palpation   Palpation comment tightness in cervical and upper trap area                   [facility] Adult PT Treatment/Exercise - 12/29/15 0001    Exercises   Exercises Neck   Neck Exercises: Machines for Strengthening   UBE (Upper Arm Bike) 2 fwd/2 back Level 2   Neck Exercises: Theraband   Rows 15 reps;Red   Shoulder External Rotation 15 reps;Red   Neck Exercises: Standing   Other Standing Exercises shoulder shruggs 2x10 2#   Modalities   Modalities Electrical Stimulation;Moist Heat   Moist Heat Therapy   Number Minutes Moist Heat 15 Minutes   Moist Heat Location Cervical   Electrical Stimulation   Electrical Stimulation Location cervical spine   Electrical  Stimulation Action IFC   Electrical Stimulation Parameters tolerance   Electrical Stimulation Goals Pain   Manual Therapy   Manual Therapy Soft tissue mobilization   Soft tissue mobilization very tight bilaterally in cervical area and upper traps                PT Education - 12/29/15 1403    Education provided Yes   Education Details scapular retraction and shoulder shruggs HEP   Person(s) Educated Patient   Methods Explanation;Demonstration;Handout   Comprehension Returned demonstration;Verbalized understanding          PT Short Term Goals - 12/29/15 1406    PT SHORT TERM GOAL #1   Title Pt will be independent with HEP   Time 1   Period Weeks   Status New           PT Long Term Goals - 12/29/15 1406    PT LONG TERM GOAL #1   Title Pt will increase cervical ROM to no more than 25% limited in all planes   Time 8   Period Weeks   Status New   PT LONG TERM GOAL #2   Title Pt will imrove UE strength bilaterally to at least 4/5   Time 8   Period Weeks   Status New   PT LONG TERM GOAL #3   Title Pt will report no more than 2/10 pain with daily activities.   Time 8   Period Weeks   Status New   PT LONG TERM GOAL #4   Title Pt will display proper lifting techniques and body mechanics for lifting up to 10#.   Time 8   Period Weeks   Status New               Plan - 12/29/15 1404    Clinical Impression Statement Pt is very tight in her cervical area and upper traps bilaterally. She was able to tolerate some light exercises today as well as some soft tissue work.    Pt will benefit from skilled therapeutic intervention in order to improve on the following deficits Decreased activity tolerance;Decreased range of motion;Decreased strength;Pain;Increased muscle spasms   Rehab Potential Good   PT Frequency 2x / week   PT Duration 8 weeks   PT Treatment/Interventions ADLs/Self Care Home Management;Electrical Stimulation;Moist Heat;Therapeutic  exercise;Therapeutic activities;Ultrasound;Patient/family education;Manual techniques;Passive range of motion   PT Next Visit Plan progress neck strengthening exercises, STM   PT Home Exercise Plan shoulder shruggs, scapular retraction   Consulted  and Agree with Plan of Care Patient          G-Codes - 01-06-16 1335    Functional Assessment Tool Used foto 54% limitation   Functional Limitation Changing and maintaining body position   Changing and Maintaining Body Position Current Status [phone]) At least 40 percent but less than 60 percent impaired, limited or restricted   Changing and Maintaining Body Position Goal Status [id number]) At least 40 percent but less than 60 percent impaired, limited or restricted       Problem List Patient Active Problem List   Diagnosis Date Noted  . Loose stools 04/14/2015  . Osteoarthritis 04/14/2015  . Chronic flank pain 06/17/2013  . Plantar fasciitis 05/25/2013  . PAD (peripheral artery disease) ([place]) 03/07/2013  . Pain in joint, lower leg 02/05/2013  . CARDIAC MURMUR 06/29/2010  . VARICOSE VEINS, LOWER EXTREMITIES 06/02/2010  . PLANTAR WART, LEFT 04/03/2009  . MOLE 04/03/2009  . HIP PAIN, RIGHT 04/03/2009  . Hyperlipidemia 06/29/2007  . ARTIFICIAL MENOPAUSE 06/29/2007  . Pain in limb 06/29/2007  . BREAST BIOPSY, HX OF 06/29/2007  . GLAUCOMA, LEFT EYE 04/27/2007  . Essential hypertension 04/27/2007  . OSTEOPOROSIS 04/27/2007    Barbette Merino, SPT January 06, 2016, 2:09 PM  [address] [place], [place], [address] Phone: [phone]   Fax:  [phone]  Name: Audri Noris MRN: [id number] Date of Birth: 15-Feb-1930

## 2016-01-05 ENCOUNTER — Ambulatory Visit: Payer: Medicare Other | Admitting: Physical Therapy

## 2016-01-13 ENCOUNTER — Encounter: Payer: Self-pay | Admitting: Physical Therapy

## 2016-01-13 ENCOUNTER — Ambulatory Visit: Payer: Medicare Other | Attending: Family Medicine | Admitting: Physical Therapy

## 2016-01-13 DIAGNOSIS — R29898 Other symptoms and signs involving the musculoskeletal system: Secondary | ICD-10-CM | POA: Diagnosis present

## 2016-01-13 DIAGNOSIS — M542 Cervicalgia: Secondary | ICD-10-CM | POA: Diagnosis present

## 2016-01-13 NOTE — Therapy (Addendum)
[place] [address] [place], [place], [address] Phone: [phone]   Fax:  [phone]  Physical Therapy Treatment  Patient Details  Name: Evelyn [name] MRN: [id number] Date of Birth: 06/07/1930 Referring Provider: Harvie Heck  Encounter Date: 01/13/2016      PT End of Session - 01/13/16 1341    Visit Number 2   Date for PT Re-Evaluation 02/26/16   PT Start Time 1300   PT Stop Time 1354   PT Time Calculation (min) 54 min   Activity Tolerance Patient tolerated treatment well   Behavior During Therapy [facility] for tasks assessed/performed      Past Medical History  Diagnosis Date  . Hypertension   . Osteoporosis   . Hyperlipidemia   . Post-menopausal     HRT    Past Surgical History  Procedure Laterality Date  . Abdominal hysterectomy    . Breast lumpectomy  1975    There were no vitals filed for this visit.  Visit Diagnosis:  Decreased range of motion of neck  Cervical pain      Subjective Assessment - 01/13/16 1259    Subjective No changes since Eval   Currently in Pain? No/denies   Pain Score 0-No pain                         [facility] Adult PT Treatment/Exercise - 01/13/16 0001    Exercises   Exercises Neck   Neck Exercises: Machines for Strengthening   UBE (Upper Arm Bike) 2 fwd/2 back Level 2   Cybex Row #15 2x10    Other Machines for Strengthening Lat #15 2x10    Neck Exercises: Theraband   Rows 15 reps;Red   Shoulder External Rotation 15 reps;Red   Neck Exercises: Standing   Other Standing Exercises shoulder shruggs 2x10 3#   Modalities   Modalities Electrical Stimulation;Moist Heat   Moist Heat Therapy   Number Minutes Moist Heat 15 Minutes   Moist Heat Location Cervical   Electrical Stimulation   Electrical Stimulation Location cervical spine   Electrical Stimulation Action IFC   Electrical Stimulation Parameters tolerance    Electrical Stimulation Goals Pain   Manual Therapy   Manual Therapy Soft tissue mobilization;Passive ROM;Manual Traction   Soft tissue mobilization very tight bilaterally in cervical area and upper traps   Passive ROM cervical spine   Manual Traction cervical spine                   PT Short Term Goals - 12/29/15 1406    PT SHORT TERM GOAL #1   Title Pt will be independent with HEP   Time 1   Period Weeks   Status New           PT Long Term Goals - 12/29/15 1406    PT LONG TERM GOAL #1   Title Pt will increase cervical ROM to no more than 25% limited in all planes   Time 8   Period Weeks   Status New   PT LONG TERM GOAL #2   Title Pt will imrove UE strength bilaterally to at least 4/5   Time 8   Period Weeks   Status New   PT LONG TERM GOAL #3   Title Pt will report no more than 2/10 pain with daily activities.   Time 8   Period Weeks   Status New   PT LONG TERM GOAL #  4   Title Pt will display proper lifting techniques and body mechanics for lifting up to 10#.   Time 8   Period Weeks   Status New               Plan - 01/13/16 1342    Clinical Impression Statement Pt remains very tight in cervical area. Tolerated a slow progression to exercises well without c/o increase pain. Positive response to MT    Pt will benefit from skilled therapeutic intervention in order to improve on the following deficits Decreased activity tolerance;Decreased range of motion;Decreased strength;Pain;Increased muscle spasms   Rehab Potential Good   PT Frequency 2x / week   PT Treatment/Interventions ADLs/Self Care Home Management;Electrical Stimulation;Moist Heat;Therapeutic exercise;Therapeutic activities;Ultrasound;Patient/family education;Manual techniques;Passive range of motion   PT Next Visit Plan continue progress neck strengthening exercises, STM        Problem List Patient Active Problem List   Diagnosis Date Noted  . Loose stools 04/14/2015  . Osteoarthritis 04/14/2015  . Chronic flank  pain 06/17/2013  . Plantar fasciitis 05/25/2013  . PAD (peripheral artery disease) ([place]) 03/07/2013  . Pain in joint, lower leg 02/05/2013  . CARDIAC MURMUR 06/29/2010  . VARICOSE VEINS, LOWER EXTREMITIES 06/02/2010  . PLANTAR WART, LEFT 04/03/2009  . MOLE 04/03/2009  . HIP PAIN, RIGHT 04/03/2009  . Hyperlipidemia 06/29/2007  . ARTIFICIAL MENOPAUSE 06/29/2007  . Pain in limb 06/29/2007  . BREAST BIOPSY, HX OF 06/29/2007  . GLAUCOMA, LEFT EYE 04/27/2007  . Essential hypertension 04/27/2007  . OSTEOPOROSIS 04/27/2007   PHYSICAL THERAPY DISCHARGE SUMMARY  Visits from Start of Care: 2   Plan: Patient agrees to discharge.  Patient goals were not met. Patient is being discharged due to not returning since the last visit.  ?????        Scot Jun, PTA  01/13/2016, 1:44 PM  [address] [place], [place], [address] Phone: [phone]   Fax:  [phone]  Name: Evelyn [name] MRN: [id number] Date of Birth: November 27, 1930

## 2016-01-19 ENCOUNTER — Ambulatory Visit: Payer: Medicare Other | Admitting: Cardiovascular Disease

## 2016-01-26 ENCOUNTER — Ambulatory Visit (INDEPENDENT_AMBULATORY_CARE_PROVIDER_SITE_OTHER): Payer: Medicare Other | Admitting: Cardiovascular Disease

## 2016-01-26 ENCOUNTER — Encounter: Payer: Self-pay | Admitting: Cardiovascular Disease

## 2016-01-26 VITALS — BP 120/70 | HR 53 | Ht 61.0 in | Wt 136.0 lb

## 2016-01-26 DIAGNOSIS — I739 Peripheral vascular disease, unspecified: Secondary | ICD-10-CM

## 2016-01-26 NOTE — Progress Notes (Signed)
Cardiology Office Note   Date:  01/26/2016   ID:  Evelyn [name], DOB 1930-06-18, MRN [id number]  PCP:  Garnet Koyanagi, DO  Cardiologist:   [name], MD   No chief complaint on file.     History of Present Illness: Evelyn [name] is a 82 y.o. female who presents for a followup visit regarding claudication and peripheral arterial disease. Patient has no previous cardiac history. She has no history of diabetes and is a previous smoker. She has known history of hypertension and hyperlipidemia. Overall she's been healthy and active. She was seen in 2014 for  bilateral calf discomfort after walking about 100 feet.  ABI was slightly reduced bilaterally with evidence of focal stenosis in the left SFA and diffuse nonobstructive disease in the right SFA. She was started with Pletal with some improvement in claudication. However, she did not tolerate the medication due to diarrhea. Claudication was not lifestyle limiting and thus she was treated medically.  She reports that her symptoms are stable with no worsening. She is able to walk around her house with only mild calf claudication. She has no claudication inside the house. She is intolerant to statins.   Past Medical History  Diagnosis Date  . Hypertension   . Osteoporosis   . Hyperlipidemia   . Post-menopausal     HRT    Past Surgical History  Procedure Laterality Date  . Abdominal hysterectomy    . Breast lumpectomy  1975     Current Outpatient Prescriptions  Medication Sig Dispense Refill  . Ascorbic Acid (VITAMIN C PO) Take 1 tablet by mouth daily. Pt unsure of how many mgs.    Marland Kitchen aspirin 81 MG tablet Take 81 mg by mouth daily.      . calcium carbonate (OS-CAL) 600 MG TABS Take 600 mg by mouth daily.      . Cholecalciferol (VITAMIN D PO) Take 1 capsule by mouth daily. Pt unsure of mgs.    . fexofenadine (ALLEGRA) 180 MG tablet Take 1 tablet (180 mg total) by mouth daily.    . fluticasone (FLONASE) 50 MCG/ACT nasal spray  Place 2 sprays into both nostrils daily. 16 g 6  . glucosamine-chondroitin 500-400 MG tablet Take 1 tablet by mouth 3 (three) times daily.    Marland Kitchen latanoprost (XALATAN) 0.005 % ophthalmic solution Place 1 drop into both eyes at bedtime.      Marland Kitchen losartan (COZAAR) 50 MG tablet Take 1 tablet (50 mg total) by mouth daily. 90 tablet 3  . metoprolol succinate (TOPROL-XL) 50 MG 24 hr tablet Take 1 tablet (50 mg total) by mouth daily. 90 tablet 3  . Multiple Vitamin (MULTIVITAMIN) tablet Take 1 tablet by mouth daily.      . Red Yeast Rice 600 MG TABS Take 1 tablet by mouth daily.       No current facility-administered medications for this visit.    Allergies:   Vicodin and Pletaal    Social History:  The patient  reports that she quit smoking about 42 years ago. Her smoking use included Cigarettes. She has a 13.5 pack-year smoking history. She has never used smokeless tobacco. She reports that she drinks alcohol. She reports that she does not use illicit drugs.   Family History:  The patient's family history includes Arthritis in her mother and sister; COPD in her father and sister; Glaucoma in her father; Heart attack (age of onset: 64) in her father; Heart disease in her mother;  Hypertension in her sister; Prostate cancer in her father.      PHYSICAL EXAM: VS:  BP 120/70 mmHg  Pulse 53  Ht 5\' 1"  (1.549 m)  Wt 136 lb (61.689 kg)  BMI 25.71 kg/m2 , BMI Body mass index is 25.71 kg/(m^2). GEN: Well nourished, well developed, in no acute distress HEENT: normal Neck: no JVD, carotid bruits, or masses Cardiac: RRR with premature beats; no murmurs, rubs, or gallops,no edema  Respiratory:  clear to auscultation bilaterally, normal work of breathing GI: soft, nontender, nondistended, + BS MS: no deformity or atrophy Skin: warm and dry, no rash Neuro:  Strength and sensation are intact Psych: euthymic mood, full affect   EKG:  EKG is ordered today. The ekg ordered today demonstrates : Sinus  bradycardia with nonspecific ST changes.   Recent Labs: No results found for requested labs within last 365 days.    Lipid Panel    Component Value Date/Time   CHOL 210* 03/26/2013 0916   TRIG 94.0 03/26/2013 0916   HDL 70.30 03/26/2013 0916   CHOLHDL 3 03/26/2013 0916   VLDL 18.8 03/26/2013 0916   LDLCALC 85 07/14/2011 1102   LDLDIRECT 130.5 03/26/2013 0916      Wt Readings from Last 3 Encounters:  01/26/16 136 lb (61.689 kg)  11/11/15 136 lb (61.689 kg)  05/19/15 135 lb (61.236 kg)        ASSESSMENT AND PLAN:  1.  PAD: She continues to have mild nonlimiting bilateral calf claudication with stable symptoms. Continue medical therapy.  2.  Hyperlipidemia: She is intolerant to statins and has been on red yeast Rice.  3. Essential Hypertension: Blood pressure is well controlled on current medications. I made no changes.  4.  Premature beats : Noted during exam. EKG is unremarkable and she has no symptoms related to this.    Disposition:   FU with me in 1 year  Signed, [name], MD  01/26/2016 5:39 PM    [place]

## 2016-01-26 NOTE — Patient Instructions (Signed)
Medication Instructions: Continue same medications.   Labwork: None.   Procedures/Testing: None.   Follow-Up: 1 year with Dr. Jazalynn Mireles.   Any Additional Special Instructions Will Be Listed Below (If Applicable).     If you need a refill on your cardiac medications before your next appointment, please call your pharmacy.   

## 2016-03-30 ENCOUNTER — Telehealth: Payer: Self-pay | Admitting: Behavioral Health

## 2016-03-30 NOTE — Telephone Encounter (Signed)
Unable to reach patient at time of Pre-Visit Call.  Left message for patient to return call when available.    

## 2016-03-31 ENCOUNTER — Encounter: Payer: Self-pay | Admitting: Family Medicine

## 2016-03-31 ENCOUNTER — Ambulatory Visit (INDEPENDENT_AMBULATORY_CARE_PROVIDER_SITE_OTHER): Payer: Medicare Other | Admitting: Family Medicine

## 2016-03-31 VITALS — BP 150/80 | HR 60 | Temp 98.0°F | Ht 61.0 in | Wt 134.4 lb

## 2016-03-31 DIAGNOSIS — I10 Essential (primary) hypertension: Secondary | ICD-10-CM | POA: Diagnosis not present

## 2016-03-31 DIAGNOSIS — M25562 Pain in left knee: Secondary | ICD-10-CM

## 2016-03-31 DIAGNOSIS — E785 Hyperlipidemia, unspecified: Secondary | ICD-10-CM | POA: Diagnosis not present

## 2016-03-31 DIAGNOSIS — Z0000 Encounter for general adult medical examination without abnormal findings: Secondary | ICD-10-CM | POA: Diagnosis not present

## 2016-03-31 LAB — CBC WITH DIFFERENTIAL/PLATELET
BASOS ABS: 0 10*3/uL (ref 0.0–0.1)
Basophils Relative: 0.7 % (ref 0.0–3.0)
EOS PCT: 2 % (ref 0.0–5.0)
Eosinophils Absolute: 0.1 10*3/uL (ref 0.0–0.7)
HEMATOCRIT: 40.2 % (ref 36.0–46.0)
Hemoglobin: 13.2 g/dL (ref 12.0–15.0)
LYMPHS ABS: 2.6 10*3/uL (ref 0.7–4.0)
LYMPHS PCT: 38.1 % (ref 12.0–46.0)
MCHC: 32.8 g/dL (ref 30.0–36.0)
MCV: 92.4 fl (ref 78.0–100.0)
MONOS PCT: 10.7 % (ref 3.0–12.0)
Monocytes Absolute: 0.7 10*3/uL (ref 0.1–1.0)
NEUTROS ABS: 3.3 10*3/uL (ref 1.4–7.7)
Neutrophils Relative %: 48.5 % (ref 43.0–77.0)
Platelets: 193 10*3/uL (ref 150.0–400.0)
RBC: 4.35 Mil/uL (ref 3.87–5.11)
RDW: 13.9 % (ref 11.5–15.5)
WBC: 6.8 10*3/uL (ref 4.0–10.5)

## 2016-03-31 LAB — COMPREHENSIVE METABOLIC PANEL
ALK PHOS: 66 U/L (ref 39–117)
ALT: 20 U/L (ref 0–35)
AST: 18 U/L (ref 0–37)
Albumin: 4.2 g/dL (ref 3.5–5.2)
BILIRUBIN TOTAL: 0.7 mg/dL (ref 0.2–1.2)
BUN: 17 mg/dL (ref 6–23)
CALCIUM: 9.7 mg/dL (ref 8.4–10.5)
CO2: 29 meq/L (ref 19–32)
Chloride: 103 mEq/L (ref 96–112)
Creatinine, Ser: 0.73 mg/dL (ref 0.40–1.20)
GFR: 80.68 mL/min (ref >60.00)
GLUCOSE: 100 mg/dL — AB (ref 70–99)
POTASSIUM: 4.1 meq/L (ref 3.5–5.1)
Sodium: 138 mEq/L (ref 135–145)
Total Protein: 8.1 g/dL (ref 6.0–8.3)

## 2016-03-31 LAB — POCT URINALYSIS DIPSTICK
BILIRUBIN UA: NEGATIVE
Glucose, UA: NEGATIVE
KETONES UA: NEGATIVE
Leukocytes, UA: NEGATIVE
Nitrite, UA: NEGATIVE
PH UA: 6.5
PROTEIN UA: NEGATIVE
RBC UA: NEGATIVE
SPEC GRAV UA: 1.015
Urobilinogen, UA: 0.2

## 2016-03-31 LAB — LIPID PANEL
CHOL/HDL RATIO: 3
Cholesterol: 211 mg/dL — ABNORMAL HIGH (ref 0–200)
HDL: 63.4 mg/dL (ref >39.00)
LDL Cholesterol: 123 mg/dL — ABNORMAL HIGH (ref 0–99)
NONHDL: 147.85
Triglycerides: 123 mg/dL (ref 0.0–149.0)
VLDL: 24.6 mg/dL (ref 0.0–40.0)

## 2016-03-31 MED ORDER — LOSARTAN POTASSIUM 50 MG PO TABS: 50.0000 mg | ORAL_TABLET | Freq: Every day | ORAL | Status: DC

## 2016-03-31 MED ORDER — METOPROLOL SUCCINATE ER 50 MG PO TB24: 50.0000 mg | ORAL_TABLET | Freq: Every day | ORAL | Status: DC

## 2016-03-31 NOTE — Progress Notes (Signed)
Pre visit review using our clinic review tool, if applicable. No additional management support is needed unless otherwise documented below in the visit note. 

## 2016-03-31 NOTE — Patient Instructions (Signed)
Preventive Care for Adults, Female A healthy lifestyle and preventive care can promote health and wellness. Preventive health guidelines for women include the following key practices.  A routine yearly physical is a good way to check with your health care provider about your health and preventive screening. It is a chance to share any concerns and updates on your health and to receive a thorough exam.  Visit your dentist for a routine exam and preventive care every 6 months. Brush your teeth twice a day and floss once a day. Good oral hygiene prevents tooth decay and gum disease.  The frequency of eye exams is based on your age, health, family medical history, use of contact lenses, and other factors. Follow your health care provider's recommendations for frequency of eye exams.  Eat a healthy diet. Foods like vegetables, fruits, whole grains, low-fat dairy products, and lean protein foods contain the nutrients you need without too many calories. Decrease your intake of foods high in solid fats, added sugars, and salt. Eat the right amount of calories for you.Get information about a proper diet from your health care provider, if necessary.  Regular physical exercise is one of the most important things you can do for your health. Most adults should get at least 150 minutes of moderate-intensity exercise (any activity that increases your heart rate and causes you to sweat) each week. In addition, most adults need muscle-strengthening exercises on 2 or more days a week.  Maintain a healthy weight. The body mass index (BMI) is a screening tool to identify possible weight problems. It provides an estimate of body fat based on height and weight. Your health care provider can find your BMI and can help you achieve or maintain a healthy weight.For adults 20 years and older:  A BMI below 18.5 is considered underweight.  A BMI of 18.5 to 24.9 is normal.  A BMI of 25 to 29.9 is considered overweight.  A  BMI of 30 and above is considered obese.  Maintain normal blood lipids and cholesterol levels by exercising and minimizing your intake of saturated fat. Eat a balanced diet with plenty of fruit and vegetables. Blood tests for lipids and cholesterol should begin at age 45 and be repeated every 5 years. If your lipid or cholesterol levels are high, you are over 50, or you are at high risk for heart disease, you may need your cholesterol levels checked more frequently.Ongoing high lipid and cholesterol levels should be treated with medicines if diet and exercise are not working.  If you smoke, find out from your health care provider how to quit. If you do not use tobacco, do not start.  Lung cancer screening is recommended for adults aged 45-80 years who are at high risk for developing lung cancer because of a history of smoking. A yearly low-dose CT scan of the lungs is recommended for people who have at least a 30-pack-year history of smoking and are a current smoker or have quit within the past 15 years. A pack year of smoking is smoking an average of 1 pack of cigarettes a day for 1 year (for example: 1 pack a day for 30 years or 2 packs a day for 15 years). Yearly screening should continue until the smoker has stopped smoking for at least 15 years. Yearly screening should be stopped for people who develop a health problem that would prevent them from having lung cancer treatment.  If you are pregnant, do not drink alcohol. If you are  breastfeeding, be very cautious about drinking alcohol. If you are not pregnant and choose to drink alcohol, do not have more than 1 drink per day. One drink is considered to be 12 ounces (355 mL) of beer, 5 ounces (148 mL) of wine, or 1.5 ounces (44 mL) of liquor.  Avoid use of street drugs. Do not share needles with anyone. Ask for help if you need support or instructions about stopping the use of drugs.  High blood pressure causes heart disease and increases the risk  of stroke. Your blood pressure should be checked at least every 1 to 2 years. Ongoing high blood pressure should be treated with medicines if weight loss and exercise do not work.  If you are 55-79 years old, ask your health care provider if you should take aspirin to prevent strokes.  Diabetes screening is done by taking a blood sample to check your blood glucose level after you have not eaten for a certain period of time (fasting). If you are not overweight and you do not have risk factors for diabetes, you should be screened once every 3 years starting at age 45. If you are overweight or obese and you are 40-70 years of age, you should be screened for diabetes every year as part of your cardiovascular risk assessment.  Breast cancer screening is essential preventive care for women. You should practice "breast self-awareness." This means understanding the normal appearance and feel of your breasts and may include breast self-examination. Any changes detected, no matter how small, should be reported to a health care provider. Women in their 20s and 30s should have a clinical breast exam (CBE) by a health care provider as part of a regular health exam every 1 to 3 years. After age 40, women should have a CBE every year. Starting at age 40, women should consider having a mammogram (breast X-ray test) every year. Women who have a family history of breast cancer should talk to their health care provider about genetic screening. Women at a high risk of breast cancer should talk to their health care providers about having an MRI and a mammogram every year.  Breast cancer gene (BRCA)-related cancer risk assessment is recommended for women who have family members with BRCA-related cancers. BRCA-related cancers include breast, ovarian, tubal, and peritoneal cancers. Having family members with these cancers may be associated with an increased risk for harmful changes (mutations) in the breast cancer genes BRCA1 and  BRCA2. Results of the assessment will determine the need for genetic counseling and BRCA1 and BRCA2 testing.  Your health care provider may recommend that you be screened regularly for cancer of the pelvic organs (ovaries, uterus, and vagina). This screening involves a pelvic examination, including checking for microscopic changes to the surface of your cervix (Pap test). You may be encouraged to have this screening done every 3 years, beginning at age 21.  For women ages 30-65, health care providers may recommend pelvic exams and Pap testing every 3 years, or they may recommend the Pap and pelvic exam, combined with testing for human papilloma virus (HPV), every 5 years. Some types of HPV increase your risk of cervical cancer. Testing for HPV may also be done on women of any age with unclear Pap test results.  Other health care providers may not recommend any screening for nonpregnant women who are considered low risk for pelvic cancer and who do not have symptoms. Ask your health care provider if a screening pelvic exam is right for   you.  If you have had past treatment for cervical cancer or a condition that could lead to cancer, you need Pap tests and screening for cancer for at least 20 years after your treatment. If Pap tests have been discontinued, your risk factors (such as having a new sexual partner) need to be reassessed to determine if screening should resume. Some women have medical problems that increase the chance of getting cervical cancer. In these cases, your health care provider may recommend more frequent screening and Pap tests.  Colorectal cancer can be detected and often prevented. Most routine colorectal cancer screening begins at the age of 50 years and continues through age 75 years. However, your health care provider may recommend screening at an earlier age if you have risk factors for colon cancer. On a yearly basis, your health care provider may provide home test kits to check  for hidden blood in the stool. Use of a small camera at the end of a tube, to directly examine the colon (sigmoidoscopy or colonoscopy), can detect the earliest forms of colorectal cancer. Talk to your health care provider about this at age 50, when routine screening begins. Direct exam of the colon should be repeated every 5-10 years through age 75 years, unless early forms of precancerous polyps or small growths are found.  People who are at an increased risk for hepatitis B should be screened for this virus. You are considered at high risk for hepatitis B if:  You were born in a country where hepatitis B occurs often. Talk with your health care provider about which countries are considered high risk.  Your parents were born in a high-risk country and you have not received a shot to protect against hepatitis B (hepatitis B vaccine).  You have HIV or AIDS.  You use needles to inject street drugs.  You live with, or have sex with, someone who has hepatitis B.  You get hemodialysis treatment.  You take certain medicines for conditions like cancer, organ transplantation, and autoimmune conditions.  Hepatitis C blood testing is recommended for all people born from 1945 through 1965 and any individual with known risks for hepatitis C.  Practice safe sex. Use condoms and avoid high-risk sexual practices to reduce the spread of sexually transmitted infections (STIs). STIs include gonorrhea, chlamydia, syphilis, trichomonas, herpes, HPV, and human immunodeficiency virus (HIV). Herpes, HIV, and HPV are viral illnesses that have no cure. They can result in disability, cancer, and death.  You should be screened for sexually transmitted illnesses (STIs) including gonorrhea and chlamydia if:  You are sexually active and are younger than 24 years.  You are older than 24 years and your health care provider tells you that you are at risk for this type of infection.  Your sexual activity has changed  since you were last screened and you are at an increased risk for chlamydia or gonorrhea. Ask your health care provider if you are at risk.  If you are at risk of being infected with HIV, it is recommended that you take a prescription medicine daily to prevent HIV infection. This is called preexposure prophylaxis (PrEP). You are considered at risk if:  You are sexually active and do not regularly use condoms or know the HIV status of your partner(s).  You take drugs by injection.  You are sexually active with a partner who has HIV.  Talk with your health care provider about whether you are at high risk of being infected with HIV. If   you choose to begin PrEP, you should first be tested for HIV. You should then be tested every 3 months for as long as you are taking PrEP.  Osteoporosis is a disease in which the bones lose minerals and strength with aging. This can result in serious bone fractures or breaks. The risk of osteoporosis can be identified using a bone density scan. Women ages 67 years and over and women at risk for fractures or osteoporosis should discuss screening with their health care providers. Ask your health care provider whether you should take a calcium supplement or vitamin D to reduce the rate of osteoporosis.  Menopause can be associated with physical symptoms and risks. Hormone replacement therapy is available to decrease symptoms and risks. You should talk to your health care provider about whether hormone replacement therapy is right for you.  Use sunscreen. Apply sunscreen liberally and repeatedly throughout the day. You should seek shade when your shadow is shorter than you. Protect yourself by wearing long sleeves, pants, a wide-brimmed hat, and sunglasses year round, whenever you are outdoors.  Once a month, do a whole body skin exam, using a mirror to look at the skin on your back. Tell your health care provider of new moles, moles that have irregular borders, moles that  are larger than a pencil eraser, or moles that have changed in shape or color.  Stay current with required vaccines (immunizations).  Influenza vaccine. All adults should be immunized every year.  Tetanus, diphtheria, and acellular pertussis (Td, Tdap) vaccine. Pregnant women should receive 1 dose of Tdap vaccine during each pregnancy. The dose should be obtained regardless of the length of time since the last dose. Immunization is preferred during the 27th-36th week of gestation. An adult who has not previously received Tdap or who does not know her vaccine status should receive 1 dose of Tdap. This initial dose should be followed by tetanus and diphtheria toxoids (Td) booster doses every 10 years. Adults with an unknown or incomplete history of completing a 3-dose immunization series with Td-containing vaccines should begin or complete a primary immunization series including a Tdap dose. Adults should receive a Td booster every 10 years.  Varicella vaccine. An adult without evidence of immunity to varicella should receive 2 doses or a second dose if she has previously received 1 dose. Pregnant females who do not have evidence of immunity should receive the first dose after pregnancy. This first dose should be obtained before leaving the health care facility. The second dose should be obtained 4-8 weeks after the first dose.  Human papillomavirus (HPV) vaccine. Females aged 13-26 years who have not received the vaccine previously should obtain the 3-dose series. The vaccine is not recommended for use in pregnant females. However, pregnancy testing is not needed before receiving a dose. If a female is found to be pregnant after receiving a dose, no treatment is needed. In that case, the remaining doses should be delayed until after the pregnancy. Immunization is recommended for any person with an immunocompromised condition through the age of 61 years if she did not get any or all doses earlier. During the  3-dose series, the second dose should be obtained 4-8 weeks after the first dose. The third dose should be obtained 24 weeks after the first dose and 16 weeks after the second dose.  Zoster vaccine. One dose is recommended for adults aged 30 years or older unless certain conditions are present.  Measles, mumps, and rubella (MMR) vaccine. Adults born  before 1957 generally are considered immune to measles and mumps. Adults born in 1957 or later should have 1 or more doses of MMR vaccine unless there is a contraindication to the vaccine or there is laboratory evidence of immunity to each of the three diseases. A routine second dose of MMR vaccine should be obtained at least 28 days after the first dose for students attending postsecondary schools, health care workers, or international travelers. People who received inactivated measles vaccine or an unknown type of measles vaccine during 1963-1967 should receive 2 doses of MMR vaccine. People who received inactivated mumps vaccine or an unknown type of mumps vaccine before 1979 and are at high risk for mumps infection should consider immunization with 2 doses of MMR vaccine. For females of childbearing age, rubella immunity should be determined. If there is no evidence of immunity, females who are not pregnant should be vaccinated. If there is no evidence of immunity, females who are pregnant should delay immunization until after pregnancy. Unvaccinated health care workers born before 1957 who lack laboratory evidence of measles, mumps, or rubella immunity or laboratory confirmation of disease should consider measles and mumps immunization with 2 doses of MMR vaccine or rubella immunization with 1 dose of MMR vaccine.  Pneumococcal 13-valent conjugate (PCV13) vaccine. When indicated, a person who is uncertain of his immunization history and has no record of immunization should receive the PCV13 vaccine. All adults 65 years of age and older should receive this  vaccine. An adult aged 19 years or older who has certain medical conditions and has not been previously immunized should receive 1 dose of PCV13 vaccine. This PCV13 should be followed with a dose of pneumococcal polysaccharide (PPSV23) vaccine. Adults who are at high risk for pneumococcal disease should obtain the PPSV23 vaccine at least 8 weeks after the dose of PCV13 vaccine. Adults older than 80 years of age who have normal immune system function should obtain the PPSV23 vaccine dose at least 1 year after the dose of PCV13 vaccine.  Pneumococcal polysaccharide (PPSV23) vaccine. When PCV13 is also indicated, PCV13 should be obtained first. All adults aged 65 years and older should be immunized. An adult younger than age 65 years who has certain medical conditions should be immunized. Any person who resides in a nursing home or long-term care facility should be immunized. An adult smoker should be immunized. People with an immunocompromised condition and certain other conditions should receive both PCV13 and PPSV23 vaccines. People with human immunodeficiency virus (HIV) infection should be immunized as soon as possible after diagnosis. Immunization during chemotherapy or radiation therapy should be avoided. Routine use of PPSV23 vaccine is not recommended for American Indians, [place] Natives, or people younger than 65 years unless there are medical conditions that require PPSV23 vaccine. When indicated, people who have unknown immunization and have no record of immunization should receive PPSV23 vaccine. One-time revaccination 5 years after the first dose of PPSV23 is recommended for people aged 19-64 years who have chronic kidney failure, nephrotic syndrome, asplenia, or immunocompromised conditions. People who received 1-2 doses of PPSV23 before age 65 years should receive another dose of PPSV23 vaccine at age 65 years or later if at least 5 years have passed since the previous dose. Doses of PPSV23 are not  needed for people immunized with PPSV23 at or after age 65 years.  Meningococcal vaccine. Adults with asplenia or persistent complement component deficiencies should receive 2 doses of quadrivalent meningococcal conjugate (MenACWY-D) vaccine. The doses should be obtained   at least 2 months apart. Microbiologists working with certain meningococcal bacteria, [place] recruits, people at risk during an outbreak, and people who travel to or live in countries with a high rate of meningitis should be immunized. A first-year college student up through age 34 years who is living in a residence hall should receive a dose if she did not receive a dose on or after her 16th birthday. Adults who have certain high-risk conditions should receive one or more doses of vaccine.  Hepatitis A vaccine. Adults who wish to be protected from this disease, have certain high-risk conditions, work with hepatitis A-infected animals, work in hepatitis A research labs, or travel to or work in countries with a high rate of hepatitis A should be immunized. Adults who were previously unvaccinated and who anticipate close contact with an international adoptee during the first 60 days after arrival in the [place] from a country with a high rate of hepatitis A should be immunized.  Hepatitis B vaccine. Adults who wish to be protected from this disease, have certain high-risk conditions, may be exposed to blood or other infectious body fluids, are household contacts or sex partners of hepatitis B positive people, are clients or workers in certain care facilities, or travel to or work in countries with a high rate of hepatitis B should be immunized.  Haemophilus influenzae type b (Hib) vaccine. A previously unvaccinated person with asplenia or sickle cell disease or having a scheduled splenectomy should receive 1 dose of Hib vaccine. Regardless of previous immunization, a recipient of a hematopoietic stem cell transplant should receive a  3-dose series 6-12 months after her successful transplant. Hib vaccine is not recommended for adults with HIV infection. Preventive Services / Frequency Ages 35 to 4 years  Blood pressure check.** / Every 3-5 years.  Lipid and cholesterol check.** / Every 5 years beginning at age 60.  Clinical breast exam.** / Every 3 years for women in their 71s and 10s.  BRCA-related cancer risk assessment.** / For women who have family members with a BRCA-related cancer (breast, ovarian, tubal, or peritoneal cancers).  Pap test.** / Every 2 years from ages 76 through 26. Every 3 years starting at age 61 through age 76 or 93 with a history of 3 consecutive normal Pap tests.  HPV screening.** / Every 3 years from ages 37 through ages 60 to 51 with a history of 3 consecutive normal Pap tests.  Hepatitis C blood test.** / For any individual with known risks for hepatitis C.  Skin self-exam. / Monthly.  Influenza vaccine. / Every year.  Tetanus, diphtheria, and acellular pertussis (Tdap, Td) vaccine.** / Consult your health care provider. Pregnant women should receive 1 dose of Tdap vaccine during each pregnancy. 1 dose of Td every 10 years.  Varicella vaccine.** / Consult your health care provider. Pregnant females who do not have evidence of immunity should receive the first dose after pregnancy.  HPV vaccine. / 3 doses over 6 months, if 93 and younger. The vaccine is not recommended for use in pregnant females. However, pregnancy testing is not needed before receiving a dose.  Measles, mumps, rubella (MMR) vaccine.** / You need at least 1 dose of MMR if you were born in 1957 or later. You may also need a 2nd dose. For females of childbearing age, rubella immunity should be determined. If there is no evidence of immunity, females who are not pregnant should be vaccinated. If there is no evidence of immunity, females who are  pregnant should delay immunization until after pregnancy.  Pneumococcal  13-valent conjugate (PCV13) vaccine.** / Consult your health care provider.  Pneumococcal polysaccharide (PPSV23) vaccine.** / 1 to 2 doses if you smoke cigarettes or if you have certain conditions.  Meningococcal vaccine.** / 1 dose if you are age 68 to 8 years and a Market researcher living in a residence hall, or have one of several medical conditions, you need to get vaccinated against meningococcal disease. You may also need additional booster doses.  Hepatitis A vaccine.** / Consult your health care provider.  Hepatitis B vaccine.** / Consult your health care provider.  Haemophilus influenzae type b (Hib) vaccine.** / Consult your health care provider. Ages 7 to 53 years  Blood pressure check.** / Every year.  Lipid and cholesterol check.** / Every 5 years beginning at age 25 years.  Lung cancer screening. / Every year if you are aged 11-80 years and have a 30-pack-year history of smoking and currently smoke or have quit within the past 15 years. Yearly screening is stopped once you have quit smoking for at least 15 years or develop a health problem that would prevent you from having lung cancer treatment.  Clinical breast exam.** / Every year after age 48 years.  BRCA-related cancer risk assessment.** / For women who have family members with a BRCA-related cancer (breast, ovarian, tubal, or peritoneal cancers).  Mammogram.** / Every year beginning at age 41 years and continuing for as long as you are in good health. Consult with your health care provider.  Pap test.** / Every 3 years starting at age 65 years through age 37 or 70 years with a history of 3 consecutive normal Pap tests.  HPV screening.** / Every 3 years from ages 72 years through ages 60 to 40 years with a history of 3 consecutive normal Pap tests.  Fecal occult blood test (FOBT) of stool. / Every year beginning at age 21 years and continuing until age 5 years. You may not need to do this test if you get  a colonoscopy every 10 years.  Flexible sigmoidoscopy or colonoscopy.** / Every 5 years for a flexible sigmoidoscopy or every 10 years for a colonoscopy beginning at age 35 years and continuing until age 48 years.  Hepatitis C blood test.** / For all people born from [birth year] through 1965 and any individual with known risks for hepatitis C.  Skin self-exam. / Monthly.  Influenza vaccine. / Every year.  Tetanus, diphtheria, and acellular pertussis (Tdap/Td) vaccine.** / Consult your health care provider. Pregnant women should receive 1 dose of Tdap vaccine during each pregnancy. 1 dose of Td every 10 years.  Varicella vaccine.** / Consult your health care provider. Pregnant females who do not have evidence of immunity should receive the first dose after pregnancy.  Zoster vaccine.** / 1 dose for adults aged 30 years or older.  Measles, mumps, rubella (MMR) vaccine.** / You need at least 1 dose of MMR if you were born in 1957 or later. You may also need a second dose. For females of childbearing age, rubella immunity should be determined. If there is no evidence of immunity, females who are not pregnant should be vaccinated. If there is no evidence of immunity, females who are pregnant should delay immunization until after pregnancy.  Pneumococcal 13-valent conjugate (PCV13) vaccine.** / Consult your health care provider.  Pneumococcal polysaccharide (PPSV23) vaccine.** / 1 to 2 doses if you smoke cigarettes or if you have certain conditions.  Meningococcal vaccine.** /  Consult your health care provider.  Hepatitis A vaccine.** / Consult your health care provider.  Hepatitis B vaccine.** / Consult your health care provider.  Haemophilus influenzae type b (Hib) vaccine.** / Consult your health care provider. Ages 64 years and over  Blood pressure check.** / Every year.  Lipid and cholesterol check.** / Every 5 years beginning at age 23 years.  Lung cancer screening. / Every year if you  are aged 16-80 years and have a 30-pack-year history of smoking and currently smoke or have quit within the past 15 years. Yearly screening is stopped once you have quit smoking for at least 15 years or develop a health problem that would prevent you from having lung cancer treatment.  Clinical breast exam.** / Every year after age 74 years.  BRCA-related cancer risk assessment.** / For women who have family members with a BRCA-related cancer (breast, ovarian, tubal, or peritoneal cancers).  Mammogram.** / Every year beginning at age 44 years and continuing for as long as you are in good health. Consult with your health care provider.  Pap test.** / Every 3 years starting at age 58 years through age 22 or 39 years with 3 consecutive normal Pap tests. Testing can be stopped between 65 and 70 years with 3 consecutive normal Pap tests and no abnormal Pap or HPV tests in the past 10 years.  HPV screening.** / Every 3 years from ages 64 years through ages 70 or 61 years with a history of 3 consecutive normal Pap tests. Testing can be stopped between 65 and 70 years with 3 consecutive normal Pap tests and no abnormal Pap or HPV tests in the past 10 years.  Fecal occult blood test (FOBT) of stool. / Every year beginning at age 40 years and continuing until age 27 years. You may not need to do this test if you get a colonoscopy every 10 years.  Flexible sigmoidoscopy or colonoscopy.** / Every 5 years for a flexible sigmoidoscopy or every 10 years for a colonoscopy beginning at age 7 years and continuing until age 32 years.  Hepatitis C blood test.** / For all people born from [birth year] through 1963 and any individual with known risks for hepatitis C.  Osteoporosis screening.** / A one-time screening for women ages 30 years and over and women at risk for fractures or osteoporosis.  Skin self-exam. / Monthly.  Influenza vaccine. / Every year.  Tetanus, diphtheria, and acellular pertussis (Tdap/Td)  vaccine.** / 1 dose of Td every 10 years.  Varicella vaccine.** / Consult your health care provider.  Zoster vaccine.** / 1 dose for adults aged 35 years or older.  Pneumococcal 13-valent conjugate (PCV13) vaccine.** / Consult your health care provider.  Pneumococcal polysaccharide (PPSV23) vaccine.** / 1 dose for all adults aged 46 years and older.  Meningococcal vaccine.** / Consult your health care provider.  Hepatitis A vaccine.** / Consult your health care provider.  Hepatitis B vaccine.** / Consult your health care provider.  Haemophilus influenzae type b (Hib) vaccine.** / Consult your health care provider. ** Family history and personal history of risk and conditions may change your health care provider's recommendations.   This information is not intended to replace advice given to you by your health care provider. Make sure you discuss any questions you have with your health care provider.   Document Released: 01/10/2002 Document Revised: 12/05/2014 Document Reviewed: 04/11/2011 Elsevier Interactive Patient Education [employer].

## 2016-03-31 NOTE — Progress Notes (Signed)
Subjective:   Evelyn [name] is a 82 y.o. female who presents for Medicare Annual (Subsequent) preventive examination.  Pt c/o L knee pain that has recently become worse over the last 6 months.  Stairs are becoming more difficult.     Review of Systems:   Review of Systems  Constitutional: Negative for activity change, appetite change and fatigue.  HENT: Negative for hearing loss, congestion, tinnitus and ear discharge.   Eyes: Negative for visual disturbance (see optho q1y -- vision corrected to 20/20 with glasses).  Respiratory: Negative for cough, chest tightness and shortness of breath.   Cardiovascular: Negative for chest pain, palpitations and leg swelling.  Gastrointestinal: Negative for abdominal pain, diarrhea, constipation and abdominal distention.  Genitourinary: Negative for urgency, frequency, decreased urine volume and difficulty urinating.  Musculoskeletal: Negative for back pain, arthralgias and gait problem.  Skin: Negative for color change, pallor and rash.  Neurological: Negative for dizziness, light-headedness, numbness and headaches.  Hematological: Negative for adenopathy. Does not bruise/bleed easily.  Psychiatric/Behavioral: Negative for suicidal ideas, confusion, sleep disturbance, self-injury, dysphoric mood, decreased concentration and agitation.  Pt is able to read and write and can do all ADLs No risk for falling No abuse/ violence in home          Objective:     Vitals: BP 150/80 mmHg  Pulse 60  Temp(Src) 98 F (36.7 C) (Oral)  Ht 5\' 1"  (1.549 m)  Wt 134 lb 6.4 oz (60.963 kg)  BMI 25.41 kg/m2  SpO2 98%  Body mass index is 25.41 kg/(m^2).  BP 150/80 mmHg  Pulse 60  Temp(Src) 98 F (36.7 C) (Oral)  Ht 5\' 1"  (1.549 m)  Wt 134 lb 6.4 oz (60.963 kg)  BMI 25.41 kg/m2  SpO2 98% General appearance: alert, cooperative, appears stated age and no distress Head: Normocephalic, without obvious abnormality, atraumatic Eyes: negative findings: lids  and lashes normal and pupils equal, round, reactive to light and accomodation Ears: normal TM's and external ear canals both ears Nose: Nares normal. Septum midline. Mucosa normal. No drainage or sinus tenderness. Throat: lips, mucosa, and tongue normal; teeth and gums normal Neck: no adenopathy, supple, symmetrical, trachea midline and thyroid not enlarged, symmetric, no tenderness/mass/nodules Back: symmetric, no curvature. ROM normal. No CVA tenderness. Lungs: clear to auscultation bilaterally Breasts: normal appearance, no masses or tenderness Heart: S1, S2 normal Abdomen: soft, non-tender; bowel sounds normal; no masses,  no organomegaly Pelvic: not indicated; status post hysterectomy, negative ROS Extremities: extremities normal, atraumatic, no cyanosis or edema Pulses: 2+ and symmetric Skin: Skin color, texture, turgor normal. No rashes or lesions Lymph nodes: Cervical, supraclavicular, and axillary nodes normal. Neurologic: Alert and oriented X 3, normal strength and tone. Normal symmetric reflexes. Normal coordination and gait  Tobacco History  Smoking status  . Former Smoker -- 0.75 packs/day for 18 years  . Types: Cigarettes  . Quit date: 02/20/1973  Smokeless tobacco  . Never Used     Counseling given: Not Answered   Past Medical History  Diagnosis Date  . Hypertension   . Osteoporosis   . Hyperlipidemia   . Post-menopausal     HRT   Past Surgical History  Procedure Laterality Date  . Abdominal hysterectomy    . Breast lumpectomy  1975   Family History  Problem Relation Age of Onset  . Arthritis Mother   . Heart disease Mother   . Heart attack Father 36  . Prostate cancer Father   . COPD Father   .  Glaucoma Father   . Hypertension Sister   . COPD Sister   . Arthritis Sister    History  Sexual Activity  . Sexual Activity:  . Partners: Male    Outpatient Encounter Prescriptions as of 03/31/2016  Medication Sig  . Ascorbic Acid (VITAMIN C PO) Take  1 tablet by mouth daily. Pt unsure of how many mgs.  Marland Kitchen aspirin 81 MG tablet Take 81 mg by mouth daily.    . calcium carbonate (OS-CAL) 600 MG TABS Take 600 mg by mouth daily.    . Cholecalciferol (VITAMIN D PO) Take 1 capsule by mouth daily. Pt unsure of mgs.  Marland Kitchen glucosamine-chondroitin 500-400 MG tablet Take 1 tablet by mouth 3 (three) times daily.  Marland Kitchen latanoprost (XALATAN) 0.005 % ophthalmic solution Place 1 drop into both eyes at bedtime.    Marland Kitchen losartan (COZAAR) 50 MG tablet Take 1 tablet (50 mg total) by mouth daily.  . metoprolol succinate (TOPROL-XL) 50 MG 24 hr tablet Take 1 tablet (50 mg total) by mouth daily.  . Multiple Vitamin (MULTIVITAMIN) tablet Take 1 tablet by mouth daily.    . Red Yeast Rice 600 MG TABS Take 1 tablet by mouth daily.    . [DISCONTINUED] losartan (COZAAR) 50 MG tablet Take 1 tablet (50 mg total) by mouth daily.  . [DISCONTINUED] metoprolol succinate (TOPROL-XL) 50 MG 24 hr tablet Take 1 tablet (50 mg total) by mouth daily.  . [DISCONTINUED] fexofenadine (ALLEGRA) 180 MG tablet Take 1 tablet (180 mg total) by mouth daily.  . [DISCONTINUED] fluticasone (FLONASE) 50 MCG/ACT nasal spray Place 2 sprays into both nostrils daily.   No facility-administered encounter medications on file as of 03/31/2016.    Activities of Daily Living In your present state of health, do you have any difficulty performing the following activities: 03/31/2016 11/11/2015  Hearing? N N  Vision? N N  Difficulty concentrating or making decisions? N N  Walking or climbing stairs? N N  Dressing or bathing? N N  Doing errands, shopping? N N    Patient Care Team: Ann Held, DO as PCP - General    Assessment:    CPE Exercise Activities and Dietary recommendations Current Exercise Habits: Home exercise routine, Type of exercise: walking, Time (Minutes): 60, Frequency (Times/Week): 4, Weekly Exercise (Minutes/Week): 240, Intensity: Mild, Exercise limited by: None identified  Goals      None     Fall Risk Fall Risk  03/31/2016 11/11/2015 10/07/2014 02/20/2013  Falls in the past year? No No No No   Depression Screen PHQ 2/9 Scores 03/31/2016 11/11/2015 10/07/2014 02/20/2013  PHQ - 2 Score 0 0 0 0     Cognitive Testing mmse 30/30  Immunization History  Administered Date(s) Administered  . Influenza Split 09/15/2011, 09/20/2012  . Influenza Whole 11/28/2000, 10/10/2007, 08/20/2008, 09/24/2009, 08/25/2010  . Influenza, High Dose Seasonal PF 10/01/2015  . Influenza,inj,Quad PF,36+ Mos 08/15/2013, 10/07/2014  . Pneumococcal Conjugate-13 10/07/2014  . Pneumococcal Polysaccharide-23 11/28/2000  . Td 11/28/2000  . Tdap 08/05/2011  . Zoster 12/28/2007   Screening Tests Health Maintenance  Topic Date Due  . MAMMOGRAM  10/15/2015  . INFLUENZA VACCINE  06/28/2016  . TETANUS/TDAP  08/04/2021  . DEXA SCAN  Completed  . ZOSTAVAX  Completed  . PNA vac Low Risk Adult  Completed      Plan:    see AVS During the course of the visit the patient was educated and counseled about the following appropriate screening and preventive services:  Vaccines to include Pneumoccal, Influenza, Hepatitis B, Td, Zostavax, HCV  Electrocardiogram  Cardiovascular Disease  Colorectal cancer screening  Bone density screening  Diabetes screening  Glaucoma screening  Mammography/PAP  Nutrition counseling   Patient Instructions (the written plan) was given to the patient.  1. Preventative health care See above  2. Hyperlipidemia Check labs    3. Essential hypertension  - metoprolol succinate (TOPROL-XL) 50 MG 24 hr tablet; Take 1 tablet (50 mg total) by mouth daily.  Dispense: 90 tablet; Refill: 3 - losartan (COZAAR) 50 MG tablet; Take 1 tablet (50 mg total) by mouth daily.  Dispense: 90 tablet; Refill: 3  4. Left knee pain   - Ambulatory referral to [place], DO  04/03/2016

## 2016-04-03 ENCOUNTER — Encounter: Payer: Self-pay | Admitting: Family Medicine

## 2016-05-23 ENCOUNTER — Other Ambulatory Visit: Payer: Self-pay | Admitting: Family Medicine

## 2016-05-23 NOTE — Telephone Encounter (Signed)
Rx sent to the pharmacy by e-script.//AB/CMA 

## 2016-06-22 ENCOUNTER — Other Ambulatory Visit: Payer: Self-pay | Admitting: Family Medicine

## 2016-06-22 DIAGNOSIS — I10 Essential (primary) hypertension: Secondary | ICD-10-CM

## 2016-09-29 ENCOUNTER — Ambulatory Visit (INDEPENDENT_AMBULATORY_CARE_PROVIDER_SITE_OTHER): Payer: Medicare Other

## 2016-09-29 DIAGNOSIS — Z23 Encounter for immunization: Secondary | ICD-10-CM

## 2017-01-24 ENCOUNTER — Ambulatory Visit (INDEPENDENT_AMBULATORY_CARE_PROVIDER_SITE_OTHER): Payer: Medicare Other | Admitting: Cardiovascular Disease

## 2017-01-24 VITALS — BP 150/70 | HR 74 | Ht 61.0 in | Wt 137.0 lb

## 2017-01-24 DIAGNOSIS — I10 Essential (primary) hypertension: Secondary | ICD-10-CM | POA: Diagnosis not present

## 2017-01-24 DIAGNOSIS — I739 Peripheral vascular disease, unspecified: Secondary | ICD-10-CM

## 2017-01-24 DIAGNOSIS — E7800 Pure hypercholesterolemia, unspecified: Secondary | ICD-10-CM

## 2017-01-24 NOTE — Progress Notes (Signed)
Cardiology Office Note   Date:  01/24/2017   ID:  Evelyn [name], DOB 08/19/1930, MRN [id number]  PCP:  Ann Held, DO  Cardiologist:   [name], MD   Chief Complaint  Patient presents with  . Follow-up    1 year      History of Present Illness: Evelyn [name] is a 83 y.o. female who presents for a followup visit regarding claudication and peripheral arterial disease. Patient has no previous cardiac history. She has no history of diabetes and is a previous smoker. She has known history of hypertension and hyperlipidemia. Overall she's been healthy and active. She was seen in 2014 for  bilateral calf discomfort after walking about 100 feet.  ABI was slightly reduced bilaterally with evidence of focal stenosis in the left SFA and diffuse nonobstructive disease in the right SFA. She was started with Pletal with some improvement in claudication. However, she did not tolerate the medication due to diarrhea. Claudication was not lifestyle limiting and thus she was treated medically.  She is intolerant to statins.  She has been doing reasonably well and denies any chest pain, shortness of breath or palpitations. She has moderate bilateral calf claudication with no recent worsening.   Past Medical History:  Diagnosis Date  . Hyperlipidemia   . Hypertension   . Osteoporosis   . Post-menopausal    HRT    Past Surgical History:  Procedure Laterality Date  . ABDOMINAL HYSTERECTOMY    . BREAST LUMPECTOMY  1975     Current Outpatient Prescriptions  Medication Sig Dispense Refill  . Ascorbic Acid (VITAMIN C PO) Take 1 tablet by mouth daily. Pt unsure of how many mgs.    Marland Kitchen aspirin 81 MG tablet Take 81 mg by mouth daily.      . calcium carbonate (OS-CAL) 600 MG TABS Take 600 mg by mouth daily.      . Cholecalciferol (VITAMIN D PO) Take 1 capsule by mouth daily. Pt unsure of mgs.    Marland Kitchen glucosamine-chondroitin 500-400 MG tablet Take 1 tablet by mouth 3 (three) times daily.     Marland Kitchen latanoprost (XALATAN) 0.005 % ophthalmic solution Place 1 drop into both eyes at bedtime.      Marland Kitchen losartan (COZAAR) 50 MG tablet Take 1 tablet (50 mg total) by mouth daily. 90 tablet 3  . metoprolol succinate (TOPROL-XL) 50 MG 24 hr tablet TAKE 1 TABLET(50 MG) BY MOUTH DAILY 90 tablet 1  . Multiple Vitamin (MULTIVITAMIN) tablet Take 1 tablet by mouth daily.      . Red Yeast Rice 600 MG TABS Take 1 tablet by mouth daily.       No current facility-administered medications for this visit.     Allergies:   Vicodin [hydrocodone-acetaminophen] and Pletaal [cilostazol]    Social History:  The patient  reports that she quit smoking about 43 years ago. Her smoking use included Cigarettes. She has a 13.50 pack-year smoking history. She has never used smokeless tobacco. She reports that she drinks alcohol. She reports that she does not use drugs.   Family History:  The patient's family history includes Arthritis in her mother and sister; COPD in her father and sister; Glaucoma in her father; Heart attack (age of onset: 37) in her father; Heart disease in her mother; Hypertension in her sister; Prostate cancer in her father.      PHYSICAL EXAM: VS:  BP (!) 150/70   Pulse 74   Ht  5\' 1"  (1.549 m)   Wt 137 lb (62.1 kg)   BMI 25.89 kg/m  , BMI Body mass index is 25.89 kg/m. GEN: Well nourished, well developed, in no acute distress HEENT: normal Neck: no JVD, carotid bruits, or masses Cardiac: RRR with premature beats; no murmurs, rubs, or gallops,no edema  Respiratory:  clear to auscultation bilaterally, normal work of breathing GI: soft, nontender, nondistended, + BS MS: no deformity or atrophy Skin: warm and dry, no rash Neuro:  Strength and sensation are intact Psych: euthymic mood, full affect Distal pulses are not palpable.   EKG:  EKG is ordered today. The ekg ordered today demonstrates sinus rhythm with marked sinus arrhythmia and PACs.   Recent Labs: 03/31/2016: ALT 20; BUN 17;  Creatinine, Ser 0.73; Hemoglobin 13.2; Platelets 193.0; Potassium 4.1; Sodium 138    Lipid Panel    Component Value Date/Time   CHOL 211 (H) 03/31/2016 1201   TRIG 123.0 03/31/2016 1201   HDL 63.40 03/31/2016 1201   CHOLHDL 3 03/31/2016 1201   VLDL 24.6 03/31/2016 1201   LDLCALC 123 (H) 03/31/2016 1201   LDLDIRECT 130.5 03/26/2013 0916      Wt Readings from Last 3 Encounters:  01/24/17 137 lb (62.1 kg)  03/31/16 134 lb 6.4 oz (61 kg)  01/26/16 136 lb (61.7 kg)        ASSESSMENT AND PLAN:  1.  PAD: She continues to have mild nonlimiting bilateral calf claudication with stable symptoms. Continue medical therapy.  2.  Hyperlipidemia: She is intolerant to statins and has been on red yeast Rice.Most recent LDL was 123.  3. Essential Hypertension: Blood pressure is mildly elevated but her blood pressure is usually controlled. I made no changes.  4. PACs: Asymptomatic.    Disposition:   FU with me in 1 year  Signed, [name], MD  01/24/2017 11:27 AM    [place]

## 2017-01-24 NOTE — Patient Instructions (Signed)

## 2017-02-16 DIAGNOSIS — H25813 Combined forms of age-related cataract, bilateral: Secondary | ICD-10-CM | POA: Diagnosis not present

## 2017-02-16 DIAGNOSIS — H18832 Recurrent erosion of cornea, left eye: Secondary | ICD-10-CM | POA: Diagnosis not present

## 2017-02-16 DIAGNOSIS — H401131 Primary open-angle glaucoma, bilateral, mild stage: Secondary | ICD-10-CM | POA: Diagnosis not present

## 2017-02-16 DIAGNOSIS — H04123 Dry eye syndrome of bilateral lacrimal glands: Secondary | ICD-10-CM | POA: Diagnosis not present

## 2017-05-18 ENCOUNTER — Other Ambulatory Visit: Payer: Self-pay | Admitting: Family Medicine

## 2017-05-18 DIAGNOSIS — I10 Essential (primary) hypertension: Secondary | ICD-10-CM

## 2017-06-12 ENCOUNTER — Other Ambulatory Visit: Payer: Self-pay | Admitting: Family Medicine

## 2017-06-12 DIAGNOSIS — I10 Essential (primary) hypertension: Secondary | ICD-10-CM

## 2017-06-12 NOTE — Telephone Encounter (Signed)
Pt is overdue for ov Refill 3 months

## 2017-06-22 DIAGNOSIS — H04123 Dry eye syndrome of bilateral lacrimal glands: Secondary | ICD-10-CM | POA: Diagnosis not present

## 2017-06-22 DIAGNOSIS — H18832 Recurrent erosion of cornea, left eye: Secondary | ICD-10-CM | POA: Diagnosis not present

## 2017-06-22 DIAGNOSIS — H25813 Combined forms of age-related cataract, bilateral: Secondary | ICD-10-CM | POA: Diagnosis not present

## 2017-06-22 DIAGNOSIS — H401131 Primary open-angle glaucoma, bilateral, mild stage: Secondary | ICD-10-CM | POA: Diagnosis not present

## 2017-08-11 ENCOUNTER — Encounter: Payer: Medicare Other | Admitting: Family Medicine

## 2017-08-22 ENCOUNTER — Ambulatory Visit (INDEPENDENT_AMBULATORY_CARE_PROVIDER_SITE_OTHER): Payer: Medicare Other | Admitting: Family Medicine

## 2017-08-22 ENCOUNTER — Encounter: Payer: Self-pay | Admitting: Family Medicine

## 2017-08-22 VITALS — BP 120/76 | HR 63 | Resp 18 | Ht 61.0 in | Wt 132.0 lb

## 2017-08-22 DIAGNOSIS — I10 Essential (primary) hypertension: Secondary | ICD-10-CM | POA: Diagnosis not present

## 2017-08-22 DIAGNOSIS — Z0000 Encounter for general adult medical examination without abnormal findings: Secondary | ICD-10-CM

## 2017-08-22 DIAGNOSIS — M810 Age-related osteoporosis without current pathological fracture: Secondary | ICD-10-CM

## 2017-08-22 DIAGNOSIS — I739 Peripheral vascular disease, unspecified: Secondary | ICD-10-CM | POA: Diagnosis not present

## 2017-08-22 LAB — POC URINALSYSI DIPSTICK (AUTOMATED)
Bilirubin, UA: NEGATIVE
Glucose, UA: NEGATIVE
KETONES UA: NEGATIVE
Leukocytes, UA: NEGATIVE
Nitrite, UA: NEGATIVE
PROTEIN UA: NEGATIVE
RBC UA: NEGATIVE
SPEC GRAV UA: 1.02 (ref 1.010–1.025)
Urobilinogen, UA: 0.2 E.U./dL
pH, UA: 6 (ref 5.0–8.0)

## 2017-08-22 MED ORDER — METOPROLOL SUCCINATE ER 50 MG PO TB24: ORAL_TABLET | ORAL | 1 refills | Status: DC

## 2017-08-22 MED ORDER — LOSARTAN POTASSIUM 50 MG PO TABS: 50.0000 mg | ORAL_TABLET | Freq: Every day | ORAL | 0 refills | Status: DC

## 2017-08-22 NOTE — Progress Notes (Signed)
Subjective:     Evelyn [name] is a 83 y.o. female and is here for a comprehensive physical exam. The patient reports no problems.  Social History   Social History  . Marital status: Widowed    Spouse name: N/A  . Number of children: N/A  . Years of education: N/A   Occupational History  . Not on file.   Social History Main Topics  . Smoking status: Former Smoker    Packs/day: 0.75    Years: 18.00    Types: Cigarettes    Quit date: 02/20/1973  . Smokeless tobacco: Never Used  . Alcohol use Yes  . Drug use: No  . Sexual activity: Yes    Partners: Male   Other Topics Concern  . Not on file   Social History Narrative   Exercise--no   Health Maintenance  Topic Date Due  . MAMMOGRAM  10/15/2015  . INFLUENZA VACCINE  06/28/2017  . TETANUS/TDAP  08/04/2021  . DEXA SCAN  Completed  . PNA vac Low Risk Adult  Completed    The following portions of the patient's history were reviewed and updated as appropriate:  She  has a past medical history of Hyperlipidemia; Hypertension; Osteoporosis; and Post-menopausal. She  does not have any pertinent problems on file. She  has a past surgical history that includes Abdominal hysterectomy and Breast lumpectomy (1975). Her family history includes Arthritis in her mother and sister; COPD in her father and sister; Glaucoma in her father; Heart attack (age of onset: 7) in her father; Heart disease in her mother; Hypertension in her sister; Prostate cancer in her father. She  reports that she quit smoking about 44 years ago. Her smoking use included Cigarettes. She has a 13.50 pack-year smoking history. She has never used smokeless tobacco. She reports that she drinks alcohol. She reports that she does not use drugs. She has a current medication list which includes the following prescription(s): ascorbic acid, aspirin, calcium carbonate, cholecalciferol, glucosamine-chondroitin, latanoprostene bunod, losartan, metoprolol succinate, multivitamin,  and red yeast rice. Current Outpatient Prescriptions on File Prior to Visit  Medication Sig Dispense Refill  . Ascorbic Acid (VITAMIN C PO) Take 1 tablet by mouth daily. Pt unsure of how many mgs.    Marland Kitchen aspirin 81 MG tablet Take 81 mg by mouth daily.      . calcium carbonate (OS-CAL) 600 MG TABS Take 600 mg by mouth daily.      . Cholecalciferol (VITAMIN D PO) Take 1 capsule by mouth daily. Pt unsure of mgs.    Marland Kitchen glucosamine-chondroitin 500-400 MG tablet Take 1 tablet by mouth 3 (three) times daily.    . Multiple Vitamin (MULTIVITAMIN) tablet Take 1 tablet by mouth daily.      . Red Yeast Rice 600 MG TABS Take 1 tablet by mouth daily.       No current facility-administered medications on file prior to visit.    She is allergic to vicodin [hydrocodone-acetaminophen] and pletaal [cilostazol]..  Review of Systems Review of Systems  Constitutional: Negative for activity change, appetite change and fatigue.  HENT: Negative for hearing loss, congestion, tinnitus and ear discharge.  dentist q95m Eyes: Negative for visual disturbance (see optho q1y -- vision corrected to 20/20 with glasses).  Respiratory: Negative for cough, chest tightness and shortness of breath.   Cardiovascular: Negative for chest pain, palpitations and leg swelling.  Gastrointestinal: Negative for abdominal pain, diarrhea, constipation and abdominal distention.  Genitourinary: Negative for urgency, frequency, decreased urine volume  and difficulty urinating.  Musculoskeletal: Negative for back pain, arthralgias and gait problem.  Skin: Negative for color change, pallor and rash.  Neurological: Negative for dizziness, light-headedness, numbness and headaches.  Hematological: Negative for adenopathy. Does not bruise/bleed easily.  Psychiatric/Behavioral: Negative for suicidal ideas, confusion, sleep disturbance, self-injury, dysphoric mood, decreased concentration and agitation.       Objective:    BP 120/76   Pulse 63    Resp 18   Ht 5\' 1"  (1.549 m)   Wt 132 lb (59.9 kg)   SpO2 97%   BMI 24.94 kg/m  General appearance: alert, cooperative, appears stated age and no distress Head: Normocephalic, without obvious abnormality, atraumatic Eyes: conjunctivae/corneas clear. PERRL, EOM's intact. Fundi benign. Ears: normal TM's and external ear canals both ears Nose: Nares normal. Septum midline. Mucosa normal. No drainage or sinus tenderness. Throat: lips, mucosa, and tongue normal; teeth and gums normal Neck: no adenopathy, no carotid bruit, no JVD, supple, symmetrical, trachea midline and thyroid not enlarged, symmetric, no tenderness/mass/nodules Back: symmetric, no curvature. ROM normal. No CVA tenderness. Lungs: clear to auscultation bilaterally Breasts: normal appearance, no masses or tenderness Heart: regular rate and rhythm, S1, S2 normal, no murmur, click, rub or gallop Abdomen: soft, non-tender; bowel sounds normal; no masses,  no organomegaly Pelvic: not indicated; status post hysterectomy, negative ROS Extremities: extremities normal, atraumatic, no cyanosis or edema Pulses: 2+ and symmetric Skin: Skin color, texture, turgor normal. No rashes or lesions Lymph nodes: Cervical, supraclavicular, and axillary nodes normal. Neurologic: Alert and oriented X 3, normal strength and tone. Normal symmetric reflexes. Normal coordination and gait    Assessment:    Healthy female exam.      Plan:    ghm utd Check labs See After Visit Summary for Counseling Recommendations    1. Preventative health care See above  2. Osteoporosis, unspecified osteoporosis type, unspecified pathological fracture presence    3. PAD (peripheral artery disease) (HCC)   - Lipid panel - CBC with Differential/Platelet - Comprehensive metabolic panel - POCT Urinalysis Dipstick (Automated)  4. Essential hypertension  Well controlled, no changes to meds. Encouraged heart healthy diet such as the DASH diet and  exercise as tolerated.  - Lipid panel - CBC with Differential/Platelet - Comprehensive metabolic panel - POCT Urinalysis Dipstick (Automated) - metoprolol succinate (TOPROL-XL) 50 MG 24 hr tablet; TAKE 1 TABLET(50 MG) BY MOUTH DAILY  Dispense: 90 tablet; Refill: 1 - losartan (COZAAR) 50 MG tablet; Take 1 tablet (50 mg total) by mouth daily.  Dispense: 90 tablet; Refill: 0

## 2017-08-22 NOTE — Patient Instructions (Signed)
Preventive Care 81 Years and Older, Female Preventive care refers to lifestyle choices and visits with your health care provider that can promote health and wellness. What does preventive care include?  A yearly physical exam. This is also called an annual well check.  Dental exams once or twice a year.  Routine eye exams. Ask your health care provider how often you should have your eyes checked.  Personal lifestyle choices, including: ? Daily care of your teeth and gums. ? Regular physical activity. ? Eating a healthy diet. ? Avoiding tobacco and drug use. ? Limiting alcohol use. ? Practicing safe sex. ? Taking low-dose aspirin every day. ? Taking vitamin and mineral supplements as recommended by your health care provider. What happens during an annual well check? The services and screenings done by your health care provider during your annual well check will depend on your age, overall health, lifestyle risk factors, and family history of disease. Counseling Your health care provider may ask you questions about your:  Alcohol use.  Tobacco use.  Drug use.  Emotional well-being.  Home and relationship well-being.  Sexual activity.  Eating habits.  History of falls.  Memory and ability to understand (cognition).  Work and work environment.  Reproductive health.  Screening You may have the following tests or measurements:  Height, weight, and BMI.  Blood pressure.  Lipid and cholesterol levels. These may be checked every 5 years, or more frequently if you are over 50 years old.  Skin check.  Lung cancer screening. You may have this screening every year starting at age 81 if you have a 30-pack-year history of smoking and currently smoke or have quit within the past 15 years.  Fecal occult blood test (FOBT) of the stool. You may have this test every year starting at age 81.  Flexible sigmoidoscopy or colonoscopy. You may have a sigmoidoscopy every 5 years or  a colonoscopy every 10 years starting at age 81.  Hepatitis C blood test.  Hepatitis B blood test.  Sexually transmitted disease (STD) testing.  Diabetes screening. This is done by checking your blood sugar (glucose) after you have not eaten for a while (fasting). You may have this done every 1-3 years.  Bone density scan. This is done to screen for osteoporosis. You may have this done starting at age 81.  Mammogram. This may be done every 1-2 years. Talk to your health care provider about how often you should have regular mammograms.  Talk with your health care provider about your test results, treatment options, and if necessary, the need for more tests. Vaccines Your health care provider may recommend certain vaccines, such as:  Influenza vaccine. This is recommended every year.  Tetanus, diphtheria, and acellular pertussis (Tdap, Td) vaccine. You may need a Td booster every 10 years.  Varicella vaccine. You may need this if you have not been vaccinated.  Zoster vaccine. You may need this after age 60.  Measles, mumps, and rubella (MMR) vaccine. You may need at least one dose of MMR if you were born in 1957 or later. You may also need a second dose.  Pneumococcal 13-valent conjugate (PCV13) vaccine. One dose is recommended after age 81.  Pneumococcal polysaccharide (PPSV23) vaccine. One dose is recommended after age 81.  Meningococcal vaccine. You may need this if you have certain conditions.  Hepatitis A vaccine. You may need this if you have certain conditions or if you travel or work in places where you may be exposed to hepatitis   A.  Hepatitis B vaccine. You may need this if you have certain conditions or if you travel or work in places where you may be exposed to hepatitis B.  Haemophilus influenzae type b (Hib) vaccine. You may need this if you have certain conditions.  Talk to your health care provider about which screenings and vaccines you need and how often you  need them. This information is not intended to replace advice given to you by your health care provider. Make sure you discuss any questions you have with your health care provider. Document Released: 12/11/2015 Document Revised: 08/03/2016 Document Reviewed: 09/15/2015 Elsevier Interactive Patient Education  2017 [employer].

## 2017-08-23 LAB — COMPREHENSIVE METABOLIC PANEL
ALT: 13 U/L (ref 0–35)
AST: 17 U/L (ref 0–37)
Albumin: 4.2 g/dL (ref 3.5–5.2)
Alkaline Phosphatase: 59 U/L (ref 39–117)
BUN: 12 mg/dL (ref 6–23)
CALCIUM: 9.8 mg/dL (ref 8.4–10.5)
CHLORIDE: 101 meq/L (ref 96–112)
CO2: 30 meq/L (ref 19–32)
Creatinine, Ser: 0.71 mg/dL (ref 0.40–1.20)
GFR: 83.03 mL/min (ref >60.00)
GLUCOSE: 85 mg/dL (ref 70–99)
Potassium: 4.6 mEq/L (ref 3.5–5.1)
Sodium: 139 mEq/L (ref 135–145)
Total Bilirubin: 0.7 mg/dL (ref 0.2–1.2)
Total Protein: 8 g/dL (ref 6.0–8.3)

## 2017-08-23 LAB — CBC WITH DIFFERENTIAL/PLATELET
BASOS PCT: 0.9 % (ref 0.0–3.0)
Basophils Absolute: 0.1 10*3/uL (ref 0.0–0.1)
EOS PCT: 1.9 % (ref 0.0–5.0)
Eosinophils Absolute: 0.1 10*3/uL (ref 0.0–0.7)
HEMATOCRIT: 41.5 % (ref 36.0–46.0)
Hemoglobin: 13.6 g/dL (ref 12.0–15.0)
LYMPHS PCT: 36.8 % (ref 12.0–46.0)
Lymphs Abs: 2.7 10*3/uL (ref 0.7–4.0)
MCHC: 32.8 g/dL (ref 30.0–36.0)
MCV: 94.8 fl (ref 78.0–100.0)
MONO ABS: 0.8 10*3/uL (ref 0.1–1.0)
Monocytes Relative: 11.4 % (ref 3.0–12.0)
NEUTROS PCT: 49 % (ref 43.0–77.0)
Neutro Abs: 3.6 10*3/uL (ref 1.4–7.7)
PLATELETS: 228 10*3/uL (ref 150.0–400.0)
RBC: 4.38 Mil/uL (ref 3.87–5.11)
RDW: 13.5 % (ref 11.5–15.5)
WBC: 7.3 10*3/uL (ref 4.0–10.5)

## 2017-08-23 LAB — LIPID PANEL
CHOLESTEROL: 201 mg/dL — AB (ref 0–200)
HDL: 57.3 mg/dL (ref >39.00)
LDL Cholesterol: 122 mg/dL — ABNORMAL HIGH (ref 0–99)
NonHDL: 143.95
TRIGLYCERIDES: 109 mg/dL (ref 0.0–149.0)
Total CHOL/HDL Ratio: 4
VLDL: 21.8 mg/dL (ref 0.0–40.0)

## 2017-09-01 ENCOUNTER — Telehealth: Payer: Self-pay | Admitting: Family Medicine

## 2017-09-01 NOTE — Telephone Encounter (Signed)
Caller name: Rihana Kiddy Relation to pt: self Call back number: [phone] Pharmacy:  Reason for call: Pt stated already had her first shingles shot done at another place, and is needing to know if on records when she had her first one done. Pt would like to schedule her 2nd dose but first needs to know if she is due. Please advise.

## 2017-09-01 NOTE — Telephone Encounter (Signed)
Returned patients call informed her that according to our records she has a shingles (Zoster) immunization in January of 2009. We have no further information listed. Patient states she thinks she got this at [facility] advised patient that she may have because we have no other documentation regarding Lot # or [place] #. Explained new Shingrix requirements to patient. States she is going to check with other pharmacies and call back if her insurance will not cover.

## 2017-09-07 ENCOUNTER — Encounter: Payer: Medicare Other | Admitting: Family Medicine

## 2017-09-26 DIAGNOSIS — H25813 Combined forms of age-related cataract, bilateral: Secondary | ICD-10-CM | POA: Diagnosis not present

## 2017-09-26 DIAGNOSIS — H18832 Recurrent erosion of cornea, left eye: Secondary | ICD-10-CM | POA: Diagnosis not present

## 2017-09-26 DIAGNOSIS — H04123 Dry eye syndrome of bilateral lacrimal glands: Secondary | ICD-10-CM | POA: Diagnosis not present

## 2017-09-26 DIAGNOSIS — H401131 Primary open-angle glaucoma, bilateral, mild stage: Secondary | ICD-10-CM | POA: Diagnosis not present

## 2018-01-10 ENCOUNTER — Other Ambulatory Visit: Payer: Self-pay | Admitting: Family Medicine

## 2018-01-10 DIAGNOSIS — I10 Essential (primary) hypertension: Secondary | ICD-10-CM

## 2018-01-23 ENCOUNTER — Encounter: Payer: Self-pay | Admitting: Cardiovascular Disease

## 2018-01-23 ENCOUNTER — Ambulatory Visit: Payer: Medicare Other | Admitting: Cardiovascular Disease

## 2018-01-23 VITALS — BP 122/80 | HR 58 | Ht 62.0 in | Wt 130.0 lb

## 2018-01-23 DIAGNOSIS — I739 Peripheral vascular disease, unspecified: Secondary | ICD-10-CM

## 2018-01-23 DIAGNOSIS — I10 Essential (primary) hypertension: Secondary | ICD-10-CM | POA: Diagnosis not present

## 2018-01-23 DIAGNOSIS — E7800 Pure hypercholesterolemia, unspecified: Secondary | ICD-10-CM | POA: Diagnosis not present

## 2018-01-23 NOTE — Progress Notes (Signed)
Cardiology Office Note   Date:  01/23/2018   ID:  Evelyn [name], DOB 04-Nov-1930, MRN [id number]  PCP:  Carollee Herter, Alferd Apa, DO  Cardiologist:   [name], MD   Chief Complaint  Patient presents with  . Follow-up    no concerns       History of Present Illness: Evelyn [name] is a 84 y.o. female who presents for a followup visit regarding claudication and peripheral arterial disease. She has no history of diabetes and is a previous smoker. She has known history of hypertension and hyperlipidemia. Overall she's been healthy and active. She was seen in 2014 for  bilateral calf discomfort after walking about 100 feet.  ABI was slightly reduced bilaterally with evidence of focal stenosis in the left SFA and diffuse nonobstructive disease in the right SFA. She was started with Pletal with some improvement in claudication. However, she did not tolerate the medication due to diarrhea. Claudication was not lifestyle limiting and thus she was treated medically.  She is intolerant to statins.  She has been been doing well overall with no recent chest pain shortness of breath.  Claudication continues to be mild and not lifestyle limiting.  Past Medical History:  Diagnosis Date  . Hyperlipidemia   . Hypertension   . Osteoporosis   . Post-menopausal    HRT    Past Surgical History:  Procedure Laterality Date  . ABDOMINAL HYSTERECTOMY    . BREAST LUMPECTOMY  1975     Current Outpatient Medications  Medication Sig Dispense Refill  . Ascorbic Acid (VITAMIN C PO) Take 1 tablet by mouth daily. Pt unsure of how many mgs.    Marland Kitchen aspirin 81 MG tablet Take 81 mg by mouth daily.      . calcium carbonate (OS-CAL) 600 MG TABS Take 600 mg by mouth daily.      . Cholecalciferol (VITAMIN D PO) Take 1 capsule by mouth daily. Pt unsure of mgs.    Marland Kitchen glucosamine-chondroitin 500-400 MG tablet Take 1 tablet by mouth 3 (three) times daily.    . Latanoprostene Bunod (VYZULTA) 0.024 % SOLN Apply 1 drop  to eye daily.    Marland Kitchen losartan (COZAAR) 50 MG tablet TAKE 1 TABLET BY MOUTH EVERY DAY 90 tablet 0  . metoprolol succinate (TOPROL-XL) 50 MG 24 hr tablet TAKE 1 TABLET(50 MG) BY MOUTH DAILY 90 tablet 1  . Multiple Vitamin (MULTIVITAMIN) tablet Take 1 tablet by mouth daily.      . Red Yeast Rice 600 MG TABS Take 1 tablet by mouth daily.       No current facility-administered medications for this visit.     Allergies:   Vicodin [hydrocodone-acetaminophen] and Pletaal [cilostazol]    Social History:  The patient  reports that she quit smoking about 44 years ago. Her smoking use included cigarettes. She has a 13.50 pack-year smoking history. she has never used smokeless tobacco. She reports that she drinks alcohol. She reports that she does not use drugs.   Family History:  The patient's family history includes Arthritis in her mother and sister; COPD in her father and sister; Glaucoma in her father; Heart attack (age of onset: 28) in her father; Heart disease in her mother; Hypertension in her sister; Prostate cancer in her father.      PHYSICAL EXAM: VS:  BP 122/80   Pulse (!) 58   Ht 5\' 2"  (1.575 m)   Wt 130 lb (59 kg)  BMI 23.78 kg/m  , BMI Body mass index is 23.78 kg/m. GEN: Well nourished, well developed, in no acute distress  HEENT: normal  Neck: no JVD, carotid bruits, or masses Cardiac: RRR with premature beats; no murmurs, rubs, or gallops,no edema  Respiratory:  clear to auscultation bilaterally, normal work of breathing GI: soft, nontender, nondistended, + BS MS: no deformity or atrophy  Skin: warm and dry, no rash Neuro:  Strength and sensation are intact Psych: euthymic mood, full affect Distal pulses are not palpable.   EKG:  EKG is ordered today. The ekg ordered today demonstrates sinus bradycardia with nonspecific ST changes.   Recent Labs: 08/22/2017: ALT 13; BUN 12; Creatinine, Ser 0.71; Hemoglobin 13.6; Platelets 228.0; Potassium 4.6; Sodium 139    Lipid  Panel    Component Value Date/Time   CHOL 201 (H) 08/22/2017 1508   TRIG 109.0 08/22/2017 1508   HDL 57.30 08/22/2017 1508   CHOLHDL 4 08/22/2017 1508   VLDL 21.8 08/22/2017 1508   LDLCALC 122 (H) 08/22/2017 1508   LDLDIRECT 130.5 03/26/2013 0916      Wt Readings from Last 3 Encounters:  01/23/18 130 lb (59 kg)  08/22/17 132 lb (59.9 kg)  01/24/17 137 lb (62.1 kg)        ASSESSMENT AND PLAN:  1.  PAD: She continues to have mild nonlimiting bilateral calf claudication with stable symptoms. Continue medical therapy.  2.  Hyperlipidemia: She is intolerant to statins and has been on red yeast Rice.Most recent LDL was 122.  3. Essential Hypertension: Blood pressure is well controlled on current medications.   Disposition:   FU with me in 1 year  Signed, [name], MD  01/23/2018 12:00 PM    [place]

## 2018-01-23 NOTE — Patient Instructions (Signed)

## 2018-02-22 DIAGNOSIS — H5203 Hypermetropia, bilateral: Secondary | ICD-10-CM | POA: Diagnosis not present

## 2018-03-10 ENCOUNTER — Other Ambulatory Visit: Payer: Self-pay | Admitting: Family Medicine

## 2018-03-10 DIAGNOSIS — I10 Essential (primary) hypertension: Secondary | ICD-10-CM

## 2018-05-03 ENCOUNTER — Ambulatory Visit: Payer: Medicare Other | Admitting: Medical

## 2018-05-04 ENCOUNTER — Encounter: Payer: Self-pay | Admitting: Family Medicine

## 2018-05-04 ENCOUNTER — Ambulatory Visit (HOSPITAL_BASED_OUTPATIENT_CLINIC_OR_DEPARTMENT_OTHER)
Admission: RE | Admit: 2018-05-04 | Discharge: 2018-05-04 | Disposition: A | Discharge status: Home / Self Care | Payer: Medicare Other | Source: Ambulatory Visit | Attending: Family Medicine | Admitting: Family Medicine

## 2018-05-04 ENCOUNTER — Ambulatory Visit (INDEPENDENT_AMBULATORY_CARE_PROVIDER_SITE_OTHER): Payer: Medicare Other | Admitting: Family Medicine

## 2018-05-04 VITALS — BP 105/74 | HR 97 | Temp 98.1°F | Resp 16 | Wt 126.4 lb

## 2018-05-04 DIAGNOSIS — J40 Bronchitis, not specified as acute or chronic: Secondary | ICD-10-CM | POA: Diagnosis not present

## 2018-05-04 DIAGNOSIS — Z87891 Personal history of nicotine dependence: Secondary | ICD-10-CM | POA: Insufficient documentation

## 2018-05-04 DIAGNOSIS — J449 Chronic obstructive pulmonary disease, unspecified: Secondary | ICD-10-CM | POA: Insufficient documentation

## 2018-05-04 DIAGNOSIS — R05 Cough: Secondary | ICD-10-CM

## 2018-05-04 DIAGNOSIS — R059 Cough, unspecified: Secondary | ICD-10-CM

## 2018-05-04 MED ORDER — AZITHROMYCIN 250 MG PO TABS: ORAL_TABLET | ORAL | 0 refills | Status: DC

## 2018-05-04 NOTE — Progress Notes (Signed)
Subjective:  I acted as a Education administrator for [employer]. Evelyn [name], [name]   Patient ID: Evelyn [name], female    DOB: Apr 15, 1930, 84 y.o.   MRN: [id number]  Chief Complaint  Patient presents with  . Cough    HPI  Patient is in today for cough. X 1 week.  Not productive, no fever, no nvd,  Pt was weak with dec appetite and lost 12 lbs in the week.  She now feels tired and weak.  Her appetite is back.   Patient Care Team: Carollee Herter, Alferd Apa, DO as PCP - General   Past Medical History:  Diagnosis Date  . Hyperlipidemia   . Hypertension   . Osteoporosis   . Post-menopausal    HRT    Past Surgical History:  Procedure Laterality Date  . ABDOMINAL HYSTERECTOMY    . BREAST LUMPECTOMY  1975    Family History  Problem Relation Age of Onset  . Arthritis Mother   . Heart disease Mother   . Heart attack Father 86  . Prostate cancer Father   . COPD Father   . Glaucoma Father   . Hypertension Sister   . COPD Sister   . Arthritis Sister     Social History   Socioeconomic History  . Marital status: Widowed    Spouse name: Not on file  . Number of children: Not on file  . Years of education: Not on file  . Highest education level: Not on file  Occupational History  . Not on file  Social Needs  . Financial resource strain: Not on file  . Food insecurity:    Worry: Not on file    Inability: Not on file  . Transportation needs:    Medical: Not on file    Non-medical: Not on file  Tobacco Use  . Smoking status: Former Smoker    Packs/day: 0.75    Years: 18.00    Pack years: 13.50    Types: Cigarettes    Last attempt to quit: 02/20/1973    Years since quitting: 45.2  . Smokeless tobacco: Never Used  Substance and Sexual Activity  . Alcohol use: Yes  . Drug use: No  . Sexual activity: Yes    Partners: Male  Lifestyle  . Physical activity:    Days per week: Not on file    Minutes per session: Not on file  . Stress: Not on file  Relationships  . Social connections:      Talks on phone: Not on file    Gets together: Not on file    Attends religious service: Not on file    Active member of club or organization: Not on file    Attends meetings of clubs or organizations: Not on file    Relationship status: Not on file  . Intimate partner violence:    Fear of current or ex partner: Not on file    Emotionally abused: Not on file    Physically abused: Not on file    Forced sexual activity: Not on file  Other Topics Concern  . Not on file  Social History Narrative   Exercise--no    Outpatient Medications Prior to Visit  Medication Sig Dispense Refill  . Ascorbic Acid (VITAMIN C PO) Take 1 tablet by mouth daily. Pt unsure of how many mgs.    Marland Kitchen aspirin 81 MG tablet Take 81 mg by mouth daily.      . calcium carbonate (OS-CAL) 600 MG TABS  Take 600 mg by mouth daily.      . Cholecalciferol (VITAMIN D PO) Take 1 capsule by mouth daily. Pt unsure of mgs.    Marland Kitchen glucosamine-chondroitin 500-400 MG tablet Take 1 tablet by mouth 3 (three) times daily.    . Latanoprostene Bunod (VYZULTA) 0.024 % SOLN Apply 1 drop to eye daily.    Marland Kitchen losartan (COZAAR) 50 MG tablet TAKE 1 TABLET BY MOUTH EVERY DAY 90 tablet 0  . metoprolol succinate (TOPROL-XL) 50 MG 24 hr tablet TAKE 1 TABLET(50 MG) BY MOUTH DAILY 90 tablet 0  . Multiple Vitamin (MULTIVITAMIN) tablet Take 1 tablet by mouth daily.      . Red Yeast Rice 600 MG TABS Take 1 tablet by mouth daily.       No facility-administered medications prior to visit.     Allergies  Allergen Reactions  . Vicodin [Hydrocodone-Acetaminophen] Other (See Comments)    Abdominal Pain  . Pletaal [Cilostazol] Diarrhea    Review of Systems  Constitutional: Positive for malaise/fatigue. Negative for chills and fever.  HENT: Negative for congestion and hearing loss.   Eyes: Negative for discharge.  Respiratory: Positive for cough. Negative for sputum production and shortness of breath.   Cardiovascular: Negative for chest pain,  palpitations and leg swelling.  Gastrointestinal: Negative for abdominal pain, blood in stool, constipation, diarrhea, heartburn, nausea and vomiting.  Genitourinary: Negative for dysuria, frequency, hematuria and urgency.  Musculoskeletal: Negative for back pain, falls and myalgias.  Skin: Negative for rash.  Neurological: Negative for dizziness, sensory change, loss of consciousness, weakness and headaches.  Endo/Heme/Allergies: Negative for environmental allergies. Does not bruise/bleed easily.  Psychiatric/Behavioral: Negative for depression and suicidal ideas. The patient is not nervous/anxious and does not have insomnia.        Objective:    Physical Exam  Constitutional: She is oriented to person, place, and time. She appears well-developed and well-nourished.  HENT:  Right Ear: External ear normal.  Left Ear: External ear normal.  + PND + errythema  Eyes: Conjunctivae are normal. Right eye exhibits no discharge. Left eye exhibits no discharge.  Cardiovascular: Normal rate, regular rhythm and normal heart sounds.  No murmur heard. Pulmonary/Chest: Effort normal. No respiratory distress. She has no wheezes. She has rhonchi. She has no rales. She exhibits no tenderness.  Abdominal: Soft. Bowel sounds are normal.  Musculoskeletal: She exhibits no edema.  Lymphadenopathy:    She has no cervical adenopathy.  Neurological: She is alert and oriented to person, place, and time.  Psychiatric: She has a normal mood and affect.  Nursing note and vitals reviewed.   BP 105/74 (BP Location: Left Arm, Patient Position: Sitting, Cuff Size: Normal)   Pulse 97   Temp 98.1 F (36.7 C) (Oral)   Resp 16   Wt 126 lb 6.4 oz (57.3 kg)   SpO2 99%   BMI 23.12 kg/m  Wt Readings from Last 3 Encounters:  05/04/18 126 lb 6.4 oz (57.3 kg)  01/23/18 130 lb (59 kg)  08/22/17 132 lb (59.9 kg)   BP Readings from Last 3 Encounters:  05/04/18 105/74  01/23/18 122/80  08/22/17 120/76      Immunization History  Administered Date(s) Administered  . Influenza Split 09/15/2011, 09/20/2012  . Influenza Whole 11/28/2000, 10/10/2007, 08/20/2008, 09/24/2009, 08/25/2010  . Influenza, High Dose Seasonal PF 10/01/2015, 09/29/2016  . Influenza,inj,Quad PF,6+ Mos 08/15/2013, 10/07/2014  . Pneumococcal Conjugate-13 10/07/2014  . Pneumococcal Polysaccharide-23 11/28/2000  . Td 11/28/2000  . Tdap 08/05/2011  .  Zoster 12/28/2007    Health Maintenance  Topic Date Due  . MAMMOGRAM  10/15/2015  . INFLUENZA VACCINE  06/28/2018  . TETANUS/TDAP  08/04/2021  . DEXA SCAN  Completed  . PNA vac Low Risk Adult  Completed    Lab Results  Component Value Date   WBC 7.3 08/22/2017   HGB 13.6 08/22/2017   HCT 41.5 08/22/2017   PLT 228.0 08/22/2017   GLUCOSE 85 08/22/2017   CHOL 201 (H) 08/22/2017   TRIG 109.0 08/22/2017   HDL 57.30 08/22/2017   LDLDIRECT 130.5 03/26/2013   LDLCALC 122 (H) 08/22/2017   ALT 13 08/22/2017   AST 17 08/22/2017   NA 139 08/22/2017   K 4.6 08/22/2017   CL 101 08/22/2017   CREATININE 0.71 08/22/2017   BUN 12 08/22/2017   CO2 30 08/22/2017   TSH 2.85 01/28/2013    Lab Results  Component Value Date   TSH 2.85 01/28/2013   Lab Results  Component Value Date   WBC 7.3 08/22/2017   HGB 13.6 08/22/2017   HCT 41.5 08/22/2017   MCV 94.8 08/22/2017   PLT 228.0 08/22/2017   Lab Results  Component Value Date   NA 139 08/22/2017   K 4.6 08/22/2017   CO2 30 08/22/2017   GLUCOSE 85 08/22/2017   BUN 12 08/22/2017   CREATININE 0.71 08/22/2017   BILITOT 0.7 08/22/2017   ALKPHOS 59 08/22/2017   AST 17 08/22/2017   ALT 13 08/22/2017   PROT 8.0 08/22/2017   ALBUMIN 4.2 08/22/2017   CALCIUM 9.8 08/22/2017   GFR 83.03 08/22/2017   Lab Results  Component Value Date   CHOL 201 (H) 08/22/2017   Lab Results  Component Value Date   HDL 57.30 08/22/2017   Lab Results  Component Value Date   LDLCALC 122 (H) 08/22/2017   Lab Results  Component  Value Date   TRIG 109.0 08/22/2017   Lab Results  Component Value Date   CHOLHDL 4 08/22/2017   No results found for: HGBA1C       Assessment & Plan:   Problem List Items Addressed This Visit    None    Visit Diagnoses    Cough    -  Primary   Relevant Orders   DG Chest 2 View   CBC with Differential/Platelet   Comprehensive metabolic panel   Bronchitis       Relevant Medications   azithromycin (ZITHROMAX Z-PAK) 250 MG tablet   Other Relevant Orders   CBC with Differential/Platelet   Comprehensive metabolic panel      I am having [name] P. Malina start on azithromycin. I am also having her maintain her aspirin, calcium carbonate, Red Yeast Rice, multivitamin, Cholecalciferol (VITAMIN D PO), Ascorbic Acid (VITAMIN C PO), glucosamine-chondroitin, Latanoprostene Bunod, losartan, and metoprolol succinate.  Meds ordered this encounter  Medications  . azithromycin (ZITHROMAX Z-PAK) 250 MG tablet    Sig: As directed    Dispense:  6 each    Refill:  0    CMA served as scribe during this visit. History, Physical and Plan performed by medical provider. Documentation and orders reviewed and attested to.  Ann Held, DO

## 2018-05-04 NOTE — Patient Instructions (Signed)

## 2018-05-05 LAB — COMPREHENSIVE METABOLIC PANEL
AG Ratio: 1 (calc) (ref 1.0–2.5)
ALBUMIN MSPROF: 3.8 g/dL (ref 3.6–5.1)
ALT: 16 U/L (ref 6–29)
AST: 18 U/L (ref 10–35)
Alkaline phosphatase (APISO): 59 U/L (ref 33–130)
BILIRUBIN TOTAL: 0.3 mg/dL (ref 0.2–1.2)
BUN: 16 mg/dL (ref 7–25)
CALCIUM: 9.9 mg/dL (ref 8.6–10.4)
CO2: 29 mmol/L (ref 20–32)
CREATININE: 0.86 mg/dL (ref 0.60–0.88)
Chloride: 101 mmol/L (ref 98–110)
Globulin: 3.9 g/dL (calc) — ABNORMAL HIGH (ref 1.9–3.7)
Glucose, Bld: 94 mg/dL (ref 65–99)
POTASSIUM: 5 mmol/L (ref 3.5–5.3)
Sodium: 139 mmol/L (ref 135–146)
TOTAL PROTEIN: 7.7 g/dL (ref 6.1–8.1)

## 2018-05-05 LAB — CBC WITH DIFFERENTIAL/PLATELET
Basophils Absolute: 88 cells/uL (ref 0–200)
Basophils Relative: 1 %
EOS PCT: 3.1 %
Eosinophils Absolute: 273 cells/uL (ref 15–500)
HEMATOCRIT: 38.5 % (ref 35.0–45.0)
Hemoglobin: 13.2 g/dL (ref 11.7–15.5)
Lymphs Abs: 2675 cells/uL (ref 850–3900)
MCH: 30.6 pg (ref 27.0–33.0)
MCHC: 34.3 g/dL (ref 32.0–36.0)
MCV: 89.3 fL (ref 80.0–100.0)
MONOS PCT: 11.1 %
MPV: 11.4 fL (ref 7.5–12.5)
NEUTROS PCT: 54.4 %
Neutro Abs: 4787 cells/uL (ref 1500–7800)
PLATELETS: 381 10*3/uL (ref 140–400)
RBC: 4.31 10*6/uL (ref 3.80–5.10)
RDW: 12 % (ref 11.0–15.0)
TOTAL LYMPHOCYTE: 30.4 %
WBC mixed population: 977 cells/uL — ABNORMAL HIGH (ref 200–950)
WBC: 8.8 10*3/uL (ref 3.8–10.8)

## 2018-05-09 ENCOUNTER — Encounter: Payer: Self-pay | Admitting: *Deleted

## 2018-05-10 DIAGNOSIS — H18832 Recurrent erosion of cornea, left eye: Secondary | ICD-10-CM | POA: Diagnosis not present

## 2018-05-10 DIAGNOSIS — H401131 Primary open-angle glaucoma, bilateral, mild stage: Secondary | ICD-10-CM | POA: Diagnosis not present

## 2018-05-10 DIAGNOSIS — H25813 Combined forms of age-related cataract, bilateral: Secondary | ICD-10-CM | POA: Diagnosis not present

## 2018-05-10 DIAGNOSIS — H1859 Other hereditary corneal dystrophies: Secondary | ICD-10-CM | POA: Diagnosis not present

## 2018-05-13 ENCOUNTER — Other Ambulatory Visit: Payer: Self-pay | Admitting: Family Medicine

## 2018-05-13 DIAGNOSIS — I10 Essential (primary) hypertension: Secondary | ICD-10-CM

## 2018-06-12 ENCOUNTER — Other Ambulatory Visit: Payer: Self-pay | Admitting: Family Medicine

## 2018-06-12 DIAGNOSIS — I10 Essential (primary) hypertension: Secondary | ICD-10-CM

## 2018-06-21 DIAGNOSIS — I10 Essential (primary) hypertension: Secondary | ICD-10-CM | POA: Diagnosis not present

## 2018-06-21 DIAGNOSIS — J209 Acute bronchitis, unspecified: Secondary | ICD-10-CM | POA: Diagnosis not present

## 2018-06-21 DIAGNOSIS — I517 Cardiomegaly: Secondary | ICD-10-CM | POA: Diagnosis not present

## 2018-06-22 ENCOUNTER — Telehealth: Payer: Self-pay | Admitting: Cardiovascular Disease

## 2018-06-22 NOTE — Telephone Encounter (Signed)
Left a message to call back.

## 2018-06-22 NOTE — Telephone Encounter (Signed)
New Message   Pt's son calling states his mom wants to know what it means to have an enlarged heart and is it anything that can be done about it. Please call

## 2018-06-22 NOTE — Telephone Encounter (Signed)
Patient called back, stated [facility], they completed an xray which showed she had an enlarged heart. Patient wanted Dr.Arida to be made aware, and to see if this was anything to be concerned with. She has no symptoms.   Thank you!

## 2018-06-22 NOTE — Telephone Encounter (Signed)
Called patient again this after noon, no answer.   Accidentally closed note. This is addendum.

## 2018-06-22 NOTE — Telephone Encounter (Signed)
Called patient, no answer. LVM to return phone call.   Unsure of dx of enlarged heart.

## 2018-06-25 ENCOUNTER — Telehealth: Payer: Self-pay | Admitting: Cardiovascular Disease

## 2018-06-25 NOTE — Telephone Encounter (Signed)
Left a message to call back.

## 2018-06-25 NOTE — Telephone Encounter (Signed)
New Message       Pt c/o of Chest Pain: 1. Are you having CP right now? No  2. Are you experiencing any other symptoms (ex. SOB, nausea, vomiting, sweating)? No 3. How long have you been experiencing CP? Thursday until Thursday afternoon 4. Is your CP continuous or coming and going? No 5. Have you taken Nitroglycerin? No   Patient went to [facility] with chest pain patient.  Patient also had bronchitis,  and she thinks it could have come from that. Patient feels better now. Just an [place]

## 2018-06-27 NOTE — Telephone Encounter (Signed)
Spoke to pt who states she went to [facility] with tightness in Chest. Pt was DX was Bronchitis and was sent him with abx prescription. Pt also states she is feeling much better but concerned due to be told the xray showed enlarged heart. Pt requesting recommendation from Dr. Fletcher Anon. Routing to MD.

## 2018-06-28 NOTE — Telephone Encounter (Signed)
Left message to call back  

## 2018-06-28 NOTE — Telephone Encounter (Signed)
Spoke with pt who states she hasnt had any chest pain or sob since that day and she is feeling fine.

## 2018-06-28 NOTE — Telephone Encounter (Signed)
If she is still having any chest pain or dyspnea, I recommend a Lexiscan Myoview to check cardiac status.

## 2018-06-29 NOTE — Telephone Encounter (Signed)
Encounter closed. Duplicate 

## 2018-07-01 IMAGING — DX DG CHEST 2V
2 series · 2 of 2 positions shown · non-contrast
Comparison: None.

CLINICAL DATA: Cough for 1 week.  Ex-smoker.

EXAM:
CHEST - 2 VIEW

[chest pa]
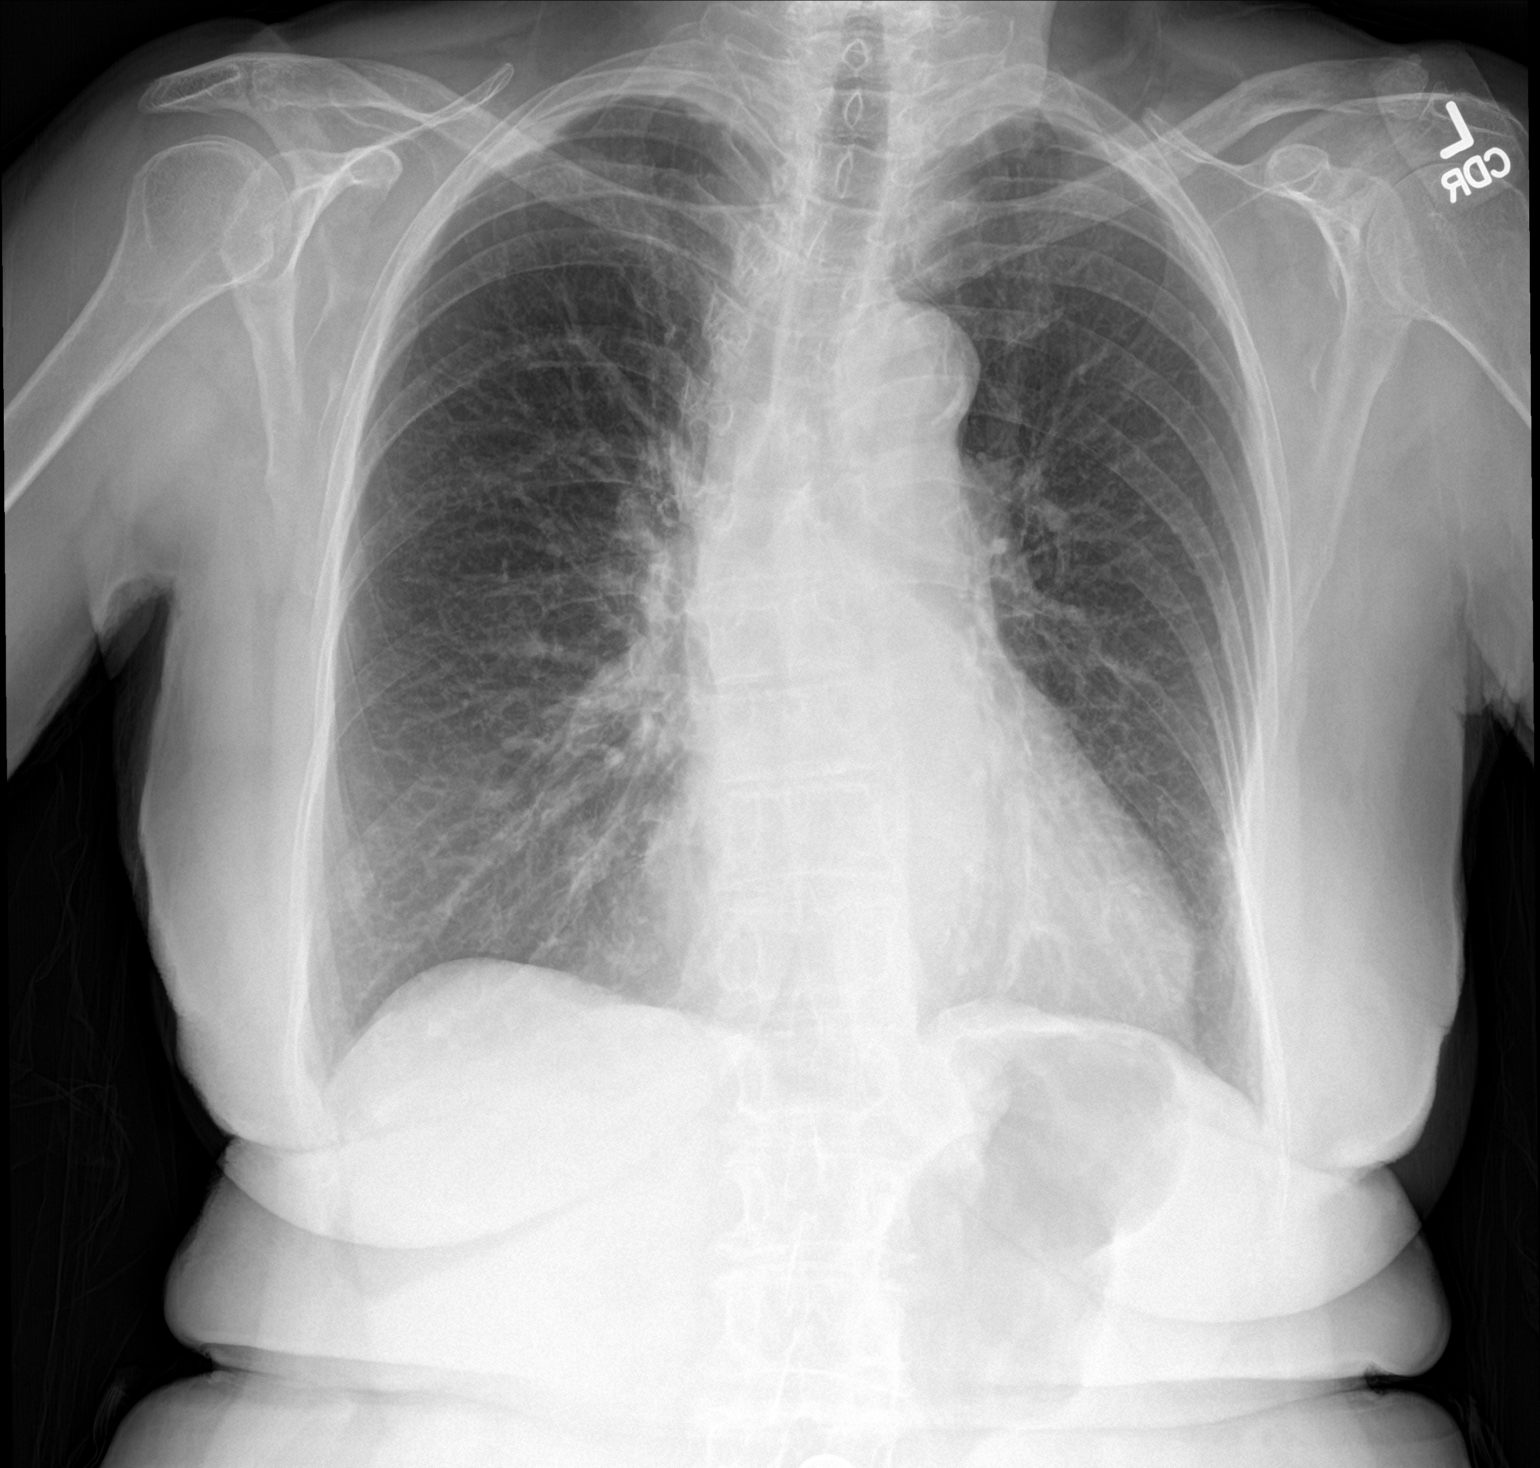

[chest lat]
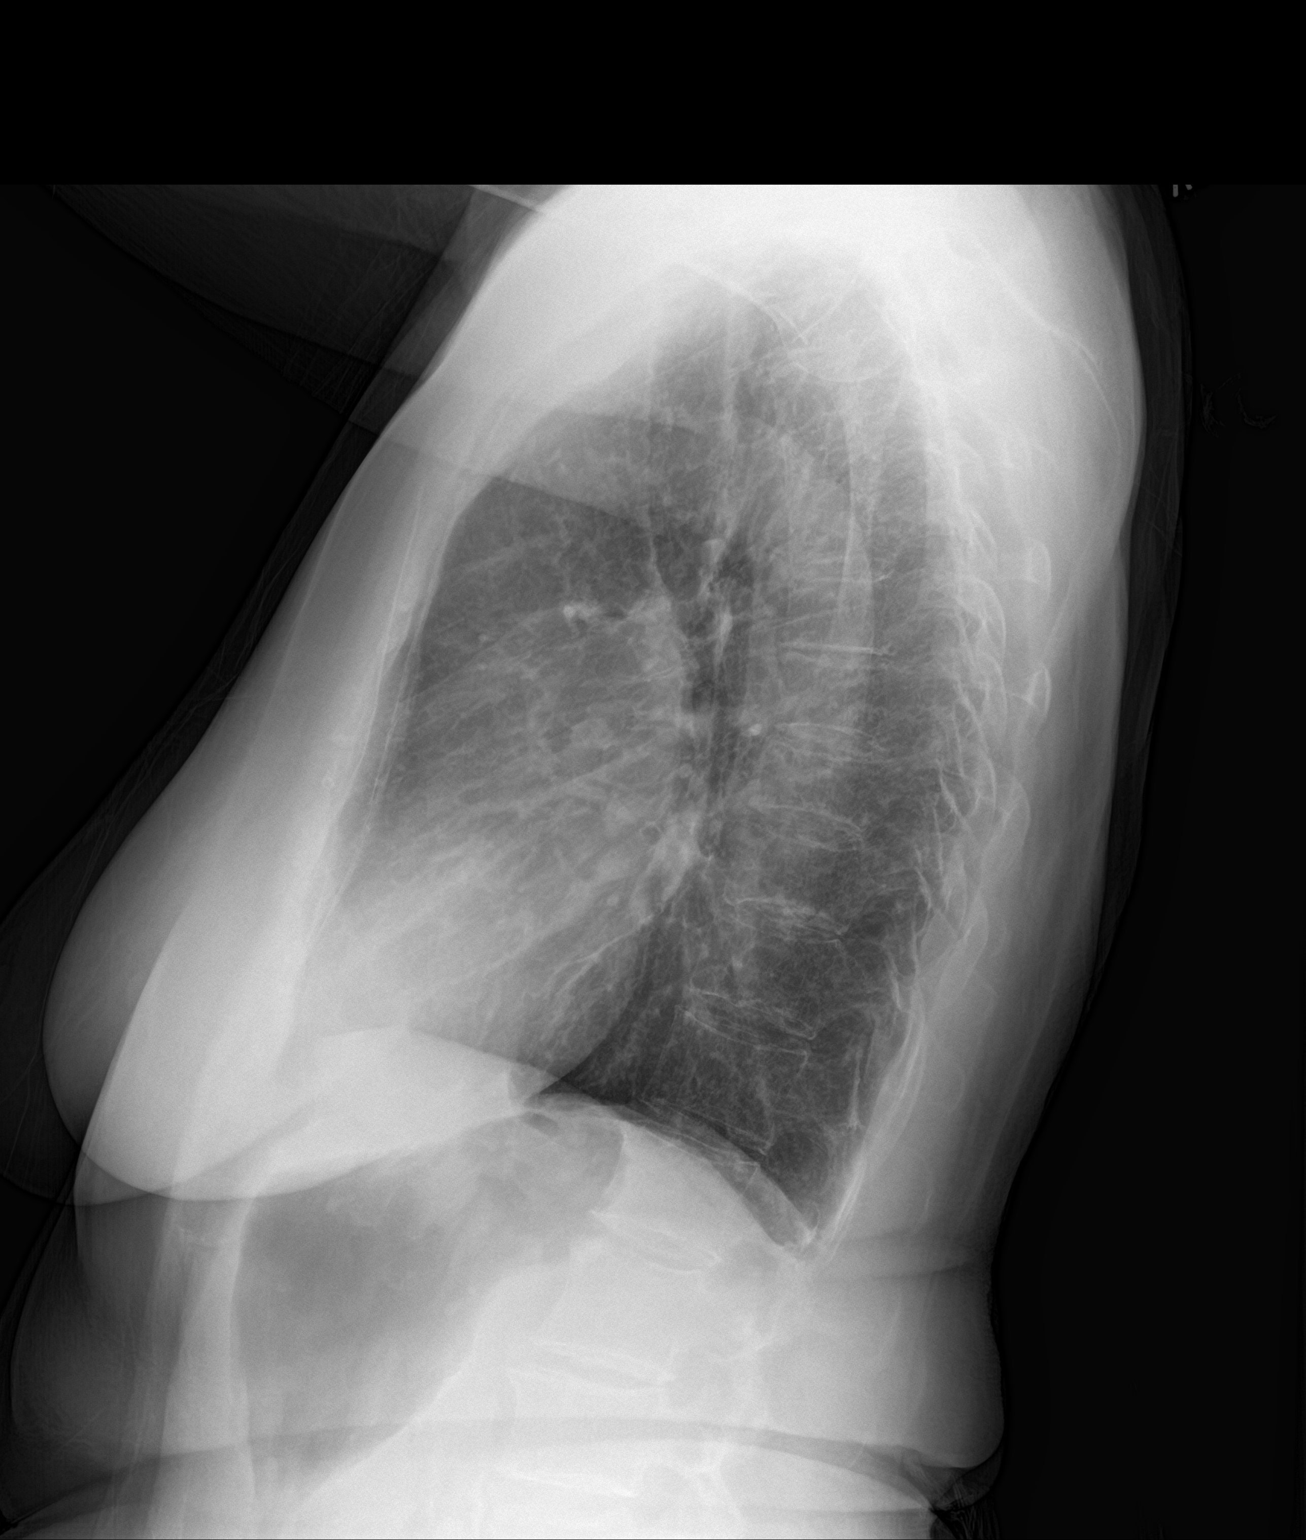

[2 of 2 positions shown; findings below may reference images not displayed]

FINDINGS: Mild cardiomegaly.  Atherosclerotic changes at the aortic arch.

Lungs are hyperexpanded. Coarse lung markings bilaterally indicate
some degree of chronic interstitial lung disease. Chronic bronchitic
changes noted centrally.

No confluent opacity to suggest a developing pneumonia. No pleural
effusion or pneumothorax seen. No acute or suspicious osseous
finding.
IMPRESSION: 1. No active cardiopulmonary disease. No evidence of pneumonia or
pulmonary edema.
2. COPD with probable associated chronic interstitial lung disease
and chronic bronchitic change.

## 2018-07-02 NOTE — Telephone Encounter (Signed)
Okay continue to monitor symptoms and notify us if any changes.

## 2018-07-03 NOTE — Telephone Encounter (Signed)
Message left with the patient to call if her symptoms return or if anything further is needed.

## 2018-07-19 DIAGNOSIS — H1859 Other hereditary corneal dystrophies: Secondary | ICD-10-CM | POA: Diagnosis not present

## 2018-07-19 DIAGNOSIS — H25813 Combined forms of age-related cataract, bilateral: Secondary | ICD-10-CM | POA: Diagnosis not present

## 2018-07-19 DIAGNOSIS — H401131 Primary open-angle glaucoma, bilateral, mild stage: Secondary | ICD-10-CM | POA: Diagnosis not present

## 2018-07-19 DIAGNOSIS — H18822 Corneal disorder due to contact lens, left eye: Secondary | ICD-10-CM | POA: Diagnosis not present

## 2018-08-12 ENCOUNTER — Other Ambulatory Visit: Payer: Self-pay | Admitting: Family Medicine

## 2018-08-12 DIAGNOSIS — I10 Essential (primary) hypertension: Secondary | ICD-10-CM

## 2018-09-04 ENCOUNTER — Encounter: Payer: Self-pay | Admitting: Family Medicine

## 2018-09-04 ENCOUNTER — Ambulatory Visit (INDEPENDENT_AMBULATORY_CARE_PROVIDER_SITE_OTHER): Payer: Medicare Other | Admitting: Family Medicine

## 2018-09-04 VITALS — BP 114/70 | HR 72 | Temp 97.9°F | Resp 16 | Ht 62.0 in | Wt 130.0 lb

## 2018-09-04 DIAGNOSIS — Z0000 Encounter for general adult medical examination without abnormal findings: Secondary | ICD-10-CM | POA: Diagnosis not present

## 2018-09-04 DIAGNOSIS — I10 Essential (primary) hypertension: Secondary | ICD-10-CM

## 2018-09-04 DIAGNOSIS — Z23 Encounter for immunization: Secondary | ICD-10-CM | POA: Diagnosis not present

## 2018-09-04 LAB — COMPREHENSIVE METABOLIC PANEL
ALK PHOS: 66 U/L (ref 39–117)
ALT: 22 U/L (ref 0–35)
AST: 19 U/L (ref 0–37)
Albumin: 4.3 g/dL (ref 3.5–5.2)
BILIRUBIN TOTAL: 0.7 mg/dL (ref 0.2–1.2)
BUN: 14 mg/dL (ref 6–23)
CALCIUM: 9.8 mg/dL (ref 8.4–10.5)
CO2: 29 mEq/L (ref 19–32)
Chloride: 101 mEq/L (ref 96–112)
Creatinine, Ser: 0.72 mg/dL (ref 0.40–1.20)
GFR: 81.5 mL/min (ref >60.00)
Glucose, Bld: 94 mg/dL (ref 70–99)
POTASSIUM: 4.4 meq/L (ref 3.5–5.1)
Sodium: 138 mEq/L (ref 135–145)
TOTAL PROTEIN: 8.1 g/dL (ref 6.0–8.3)

## 2018-09-04 LAB — POC URINALSYSI DIPSTICK (AUTOMATED)
BILIRUBIN UA: NEGATIVE
Blood, UA: NEGATIVE
GLUCOSE UA: NEGATIVE
Ketones, UA: NEGATIVE
LEUKOCYTES UA: NEGATIVE
NITRITE UA: NEGATIVE
PH UA: 6 (ref 5.0–8.0)
Protein, UA: NEGATIVE
Spec Grav, UA: 1.015 (ref 1.010–1.025)
Urobilinogen, UA: 0.2 E.U./dL

## 2018-09-04 LAB — CBC WITH DIFFERENTIAL/PLATELET
BASOS ABS: 0.1 10*3/uL (ref 0.0–0.1)
Basophils Relative: 0.7 % (ref 0.0–3.0)
Eosinophils Absolute: 0.2 10*3/uL (ref 0.0–0.7)
Eosinophils Relative: 2.1 % (ref 0.0–5.0)
HEMATOCRIT: 43.4 % (ref 36.0–46.0)
Hemoglobin: 14.4 g/dL (ref 12.0–15.0)
LYMPHS ABS: 3.1 10*3/uL (ref 0.7–4.0)
LYMPHS PCT: 41.5 % (ref 12.0–46.0)
MCHC: 33.1 g/dL (ref 30.0–36.0)
MCV: 91.9 fl (ref 78.0–100.0)
MONOS PCT: 11.8 % (ref 3.0–12.0)
Monocytes Absolute: 0.9 10*3/uL (ref 0.1–1.0)
NEUTROS ABS: 3.2 10*3/uL (ref 1.4–7.7)
Neutrophils Relative %: 43.9 % (ref 43.0–77.0)
PLATELETS: 216 10*3/uL (ref 150.0–400.0)
RBC: 4.72 Mil/uL (ref 3.87–5.11)
RDW: 14.1 % (ref 11.5–15.5)
WBC: 7.4 10*3/uL (ref 4.0–10.5)

## 2018-09-04 LAB — LIPID PANEL
Cholesterol: 216 mg/dL — ABNORMAL HIGH (ref 0–200)
HDL: 62.1 mg/dL (ref >39.00)
LDL Cholesterol: 134 mg/dL — ABNORMAL HIGH (ref 0–99)
NonHDL: 153.9
TRIGLYCERIDES: 102 mg/dL (ref 0.0–149.0)
Total CHOL/HDL Ratio: 3
VLDL: 20.4 mg/dL (ref 0.0–40.0)

## 2018-09-04 MED ORDER — METOPROLOL SUCCINATE ER 50 MG PO TB24: ORAL_TABLET | ORAL | 3 refills | Status: DC

## 2018-09-04 MED ORDER — LOSARTAN POTASSIUM 50 MG PO TABS: 50.0000 mg | ORAL_TABLET | Freq: Every day | ORAL | 3 refills | Status: DC

## 2018-09-04 NOTE — Progress Notes (Signed)
Subjective:     Evelyn [name] is a 84 y.o. female and is here for a comprehensive physical exam. The patient reports no problems.  Social History   Socioeconomic History  . Marital status: Widowed    Spouse name: Not on file  . Number of children: Not on file  . Years of education: Not on file  . Highest education level: Not on file  Occupational History  . Not on file  Social Needs  . Financial resource strain: Not on file  . Food insecurity:    Worry: Not on file    Inability: Not on file  . Transportation needs:    Medical: Not on file    Non-medical: Not on file  Tobacco Use  . Smoking status: Former Smoker    Packs/day: 0.75    Years: 18.00    Pack years: 13.50    Types: Cigarettes    Last attempt to quit: 02/20/1973    Years since quitting: 45.5  . Smokeless tobacco: Never Used  Substance and Sexual Activity  . Alcohol use: Yes  . Drug use: No  . Sexual activity: Yes    Partners: Male  Lifestyle  . Physical activity:    Days per week: Not on file    Minutes per session: Not on file  . Stress: Not on file  Relationships  . Social connections:    Talks on phone: Not on file    Gets together: Not on file    Attends religious service: Not on file    Active member of club or organization: Not on file    Attends meetings of clubs or organizations: Not on file    Relationship status: Not on file  . Intimate partner violence:    Fear of current or ex partner: Not on file    Emotionally abused: Not on file    Physically abused: Not on file    Forced sexual activity: Not on file  Other Topics Concern  . Not on file  Social History Narrative   Exercise--no   Health Maintenance  Topic Date Due  . MAMMOGRAM  10/15/2015  . INFLUENZA VACCINE  06/28/2018  . TETANUS/TDAP  08/04/2021  . DEXA SCAN  Completed  . PNA vac Low Risk Adult  Completed    The following portions of the patient's history were reviewed and updated as appropriate:  She  has a past medical  history of Hyperlipidemia, Hypertension, Osteoporosis, and Post-menopausal. She does not have any pertinent problems on file. She  has a past surgical history that includes Abdominal hysterectomy and Breast lumpectomy (1975). Her family history includes Arthritis in her mother and sister; COPD in her father and sister; Glaucoma in her father; Heart attack (age of onset: 14) in her father; Heart disease in her mother; Hypertension in her sister; Prostate cancer in her father. She  reports that she quit smoking about 45 years ago. Her smoking use included cigarettes. She has a 13.50 pack-year smoking history. She has never used smokeless tobacco. She reports that she drinks alcohol. She reports that she does not use drugs. She has a current medication list which includes the following prescription(s): ascorbic acid, aspirin, azithromycin, calcium carbonate, cholecalciferol, glucosamine-chondroitin, latanoprostene bunod, losartan, metoprolol succinate, multivitamin, and red yeast rice. Current Outpatient Medications on File Prior to Visit  Medication Sig Dispense Refill  . Ascorbic Acid (VITAMIN C PO) Take 1 tablet by mouth daily. Pt unsure of how many mgs.    Marland Kitchen aspirin  81 MG tablet Take 81 mg by mouth daily.      Marland Kitchen azithromycin (ZITHROMAX Z-PAK) 250 MG tablet As directed 6 each 0  . calcium carbonate (OS-CAL) 600 MG TABS Take 600 mg by mouth daily.      . Cholecalciferol (VITAMIN D PO) Take 1 capsule by mouth daily. Pt unsure of mgs.    Marland Kitchen glucosamine-chondroitin 500-400 MG tablet Take 1 tablet by mouth 3 (three) times daily.    . Latanoprostene Bunod (VYZULTA) 0.024 % SOLN Apply 1 drop to eye daily.    . Multiple Vitamin (MULTIVITAMIN) tablet Take 1 tablet by mouth daily.      . Red Yeast Rice 600 MG TABS Take 1 tablet by mouth daily.       No current facility-administered medications on file prior to visit.    She is allergic to vicodin [hydrocodone-acetaminophen] and pletaal  [cilostazol]..  Review of Systems Review of Systems  Constitutional: Negative for activity change, appetite change and fatigue.  HENT: Negative for hearing loss, congestion, tinnitus and ear discharge.  dentist q43m Eyes: + glucoma  Respiratory: Negative for cough, chest tightness and shortness of breath.   Cardiovascular: Negative for chest pain, palpitations and leg swelling.  Gastrointestinal: Negative for abdominal pain, diarrhea, constipation and abdominal distention.  Genitourinary: Negative for urgency, frequency, decreased urine volume and difficulty urinating.  Musculoskeletal: Negative for back pain, arthralgias and gait problem.  Skin: Negative for color change, pallor and rash.  Neurological: Negative for dizziness, light-headedness, numbness and headaches.  Hematological: Negative for adenopathy. Does not bruise/bleed easily.  Psychiatric/Behavioral: Negative for suicidal ideas, confusion, sleep disturbance, self-injury, dysphoric mood, decreased concentration and agitation.       Objective:    BP 114/70 (BP Location: Left Arm, Patient Position: Sitting, Cuff Size: Normal)   Pulse 72   Temp 97.9 F (36.6 C) (Oral)   Resp 16   Ht 5\' 2"  (1.575 m)   Wt 130 lb (59 kg)   SpO2 98%   BMI 23.78 kg/m  General appearance: alert, cooperative, appears stated age and no distress Head: Normocephalic, without obvious abnormality, atraumatic Eyes: conjunctivae/corneas clear. PERRL, EOM's intact. Fundi benign. Ears: normal TM's and external ear canals both ears Nose: Nares normal. Septum midline. Mucosa normal. No drainage or sinus tenderness. Throat: lips, mucosa, and tongue normal; teeth and gums normal Neck: no adenopathy, no carotid bruit, no JVD, supple, symmetrical, trachea midline and thyroid not enlarged, symmetric, no tenderness/mass/nodules Back: symmetric, no curvature. ROM normal. No CVA tenderness. Lungs: clear to auscultation bilaterally Breasts: normal appearance,  no masses or tenderness Heart: regular rate and rhythm, S1, S2 normal, no murmur, click, rub or gallop Abdomen: soft, non-tender; bowel sounds normal; no masses,  no organomegaly Pelvic: not indicated; status post hysterectomy, negative ROS Extremities: extremities normal, atraumatic, no cyanosis or edema Pulses: 2+ and symmetric Skin: Skin color, texture, turgor normal. No rashes or lesions Lymph nodes: Cervical, supraclavicular, and axillary nodes normal. Neurologic: Alert and oriented X 3, normal strength and tone. Normal symmetric reflexes. Normal coordination and gait    Assessment:    Healthy female exam.     . Plan:  1. Need for influenza vaccination  - Flu vaccine HIGH DOSE PF (Fluzone High dose)  2. Essential hypertension Well controlled, no changes to meds. Encouraged heart healthy diet such as the DASH diet and exercise as tolerated.  - metoprolol succinate (TOPROL-XL) 50 MG 24 hr tablet; TAKE 1 TABLET BY MOUTH DAILY  Dispense: 90 tablet; Refill: 3 -  losartan (COZAAR) 50 MG tablet; Take 1 tablet (50 mg total) by mouth daily.  Dispense: 90 tablet; Refill: 3 - CBC with Differential/Platelet - Comprehensive metabolic panel - Lipid panel  3. Annual physical exam ghm utd Check labs  See AVS - POCT Urinalysis Dipstick (Automated) - CBC with Differential/Platelet - Comprehensive metabolic panel - Lipid panel    See After Visit Summary for Counseling Recommendations

## 2018-09-04 NOTE — Patient Instructions (Signed)
Preventive Care 82 Years and Older, Female Preventive care refers to lifestyle choices and visits with your health care provider that can promote health and wellness. What does preventive care include?  A yearly physical exam. This is also called an annual well check.  Dental exams once or twice a year.  Routine eye exams. Ask your health care provider how often you should have your eyes checked.  Personal lifestyle choices, including: ? Daily care of your teeth and gums. ? Regular physical activity. ? Eating a healthy diet. ? Avoiding tobacco and drug use. ? Limiting alcohol use. ? Practicing safe sex. ? Taking low-dose aspirin every day. ? Taking vitamin and mineral supplements as recommended by your health care provider. What happens during an annual well check? The services and screenings done by your health care provider during your annual well check will depend on your age, overall health, lifestyle risk factors, and family history of disease. Counseling Your health care provider may ask you questions about your:  Alcohol use.  Tobacco use.  Drug use.  Emotional well-being.  Home and relationship well-being.  Sexual activity.  Eating habits.  History of falls.  Memory and ability to understand (cognition).  Work and work environment.  Reproductive health.  Screening You may have the following tests or measurements:  Height, weight, and BMI.  Blood pressure.  Lipid and cholesterol levels. These may be checked every 5 years, or more frequently if you are over 50 years old.  Skin check.  Lung cancer screening. You may have this screening every year starting at age 55 if you have a 30-pack-year history of smoking and currently smoke or have quit within the past 15 years.  Fecal occult blood test (FOBT) of the stool. You may have this test every year starting at age 50.  Flexible sigmoidoscopy or colonoscopy. You may have a sigmoidoscopy every 5 years or  a colonoscopy every 10 years starting at age 50.  Hepatitis C blood test.  Hepatitis B blood test.  Sexually transmitted disease (STD) testing.  Diabetes screening. This is done by checking your blood sugar (glucose) after you have not eaten for a while (fasting). You may have this done every 1-3 years.  Bone density scan. This is done to screen for osteoporosis. You may have this done starting at age 65.  Mammogram. This may be done every 1-2 years. Talk to your health care provider about how often you should have regular mammograms.  Talk with your health care provider about your test results, treatment options, and if necessary, the need for more tests. Vaccines Your health care provider may recommend certain vaccines, such as:  Influenza vaccine. This is recommended every year.  Tetanus, diphtheria, and acellular pertussis (Tdap, Td) vaccine. You may need a Td booster every 10 years.  Varicella vaccine. You may need this if you have not been vaccinated.  Zoster vaccine. You may need this after age 60.  Measles, mumps, and rubella (MMR) vaccine. You may need at least one dose of MMR if you were born in 1957 or later. You may also need a second dose.  Pneumococcal 13-valent conjugate (PCV13) vaccine. One dose is recommended after age 65.  Pneumococcal polysaccharide (PPSV23) vaccine. One dose is recommended after age 65.  Meningococcal vaccine. You may need this if you have certain conditions.  Hepatitis A vaccine. You may need this if you have certain conditions or if you travel or work in places where you may be exposed to hepatitis   A.  Hepatitis B vaccine. You may need this if you have certain conditions or if you travel or work in places where you may be exposed to hepatitis B.  Haemophilus influenzae type b (Hib) vaccine. You may need this if you have certain conditions.  Talk to your health care provider about which screenings and vaccines you need and how often you  need them. This information is not intended to replace advice given to you by your health care provider. Make sure you discuss any questions you have with your health care provider. Document Released: 12/11/2015 Document Revised: 08/03/2016 Document Reviewed: 09/15/2015 Elsevier Interactive Patient Education  2018 [employer]vier Inc[employer]  

## 2018-09-10 ENCOUNTER — Other Ambulatory Visit: Payer: Self-pay | Admitting: Family Medicine

## 2018-09-10 DIAGNOSIS — I10 Essential (primary) hypertension: Secondary | ICD-10-CM

## 2018-11-05 ENCOUNTER — Other Ambulatory Visit: Payer: Self-pay | Admitting: Family Medicine

## 2018-11-05 DIAGNOSIS — I10 Essential (primary) hypertension: Secondary | ICD-10-CM

## 2018-11-12 ENCOUNTER — Other Ambulatory Visit: Payer: Self-pay | Admitting: *Deleted

## 2018-11-12 DIAGNOSIS — I10 Essential (primary) hypertension: Secondary | ICD-10-CM

## 2018-11-12 MED ORDER — LOSARTAN POTASSIUM 50 MG PO TABS: 50.0000 mg | ORAL_TABLET | Freq: Every day | ORAL | 3 refills | Status: DC

## 2018-12-06 DIAGNOSIS — H25813 Combined forms of age-related cataract, bilateral: Secondary | ICD-10-CM | POA: Diagnosis not present

## 2018-12-06 DIAGNOSIS — H18832 Recurrent erosion of cornea, left eye: Secondary | ICD-10-CM | POA: Diagnosis not present

## 2018-12-06 DIAGNOSIS — H1859 Other hereditary corneal dystrophies: Secondary | ICD-10-CM | POA: Diagnosis not present

## 2018-12-06 DIAGNOSIS — H401131 Primary open-angle glaucoma, bilateral, mild stage: Secondary | ICD-10-CM | POA: Diagnosis not present

## 2018-12-12 DIAGNOSIS — L820 Inflamed seborrheic keratosis: Secondary | ICD-10-CM | POA: Diagnosis not present

## 2019-01-22 ENCOUNTER — Encounter (INDEPENDENT_AMBULATORY_CARE_PROVIDER_SITE_OTHER): Payer: Self-pay

## 2019-01-22 ENCOUNTER — Ambulatory Visit: Payer: Medicare Other | Admitting: Cardiovascular Disease

## 2019-01-22 ENCOUNTER — Encounter: Payer: Self-pay | Admitting: Cardiovascular Disease

## 2019-01-22 VITALS — BP 174/90 | HR 86 | Ht 62.0 in | Wt 135.0 lb

## 2019-01-22 DIAGNOSIS — E785 Hyperlipidemia, unspecified: Secondary | ICD-10-CM | POA: Diagnosis not present

## 2019-01-22 DIAGNOSIS — I4891 Unspecified atrial fibrillation: Secondary | ICD-10-CM | POA: Diagnosis not present

## 2019-01-22 DIAGNOSIS — I10 Essential (primary) hypertension: Secondary | ICD-10-CM | POA: Diagnosis not present

## 2019-01-22 DIAGNOSIS — I739 Peripheral vascular disease, unspecified: Secondary | ICD-10-CM

## 2019-01-22 MED ORDER — RIVAROXABAN 20 MG PO TABS: 20.0000 mg | ORAL_TABLET | Freq: Every day | ORAL | 11 refills | Status: DC

## 2019-01-22 NOTE — Patient Instructions (Signed)
Medication Instructions:  STOP the Aspirin START Xarelto 20 mg daily If you need a refill on your cardiac medications before your next appointment, please call your pharmacy.   Lab work: None ordered  Testing/Procedures: Your physician has requested that you have an echocardiogram. Echocardiography is a painless test that uses sound waves to create images of your heart. It provides your doctor with information about the size and shape of your heart and how well your heart's chambers and valves are working. You may receive an ultrasound enhancing agent through an IV if needed to better visualize your heart during the echo.This procedure takes approximately one hour. There are no restrictions for this procedure.   Follow-Up: At [facility], you and your health needs are our priority.  As part of our continuing mission to provide you with exceptional heart care, we have created designated Provider Care Teams.  These Care Teams include your primary Cardiologist (physician) and Advanced Practice Providers (APPs -  Physician Assistants and Nurse Practitioners) who all work together to provide you with the care you need, when you need it. You will need a follow up appointment in 2 months with an APP.  Kerin Ransom, PA-C [place], [place] . Sande Rives, PA-C

## 2019-01-22 NOTE — Progress Notes (Signed)
Cardiology Office Note   Date:  01/22/2019   ID:  Evelyn [name], DOB [birth year], MRN [id number]  PCP:  Evelyn [name], Evelyn Apa, DO  Cardiologist:   Evelyn Sacramento, MD   No chief complaint on file.     History of Present Illness: Evelyn [name] is a 85 y.o. female who presents for a followup visit regarding claudication and peripheral arterial disease. She has no history of diabetes and is a previous smoker. She has known history of hypertension and hyperlipidemia. Overall she's been healthy and active. She was seen in 2014 for  bilateral calf discomfort after walking about 100 feet.  ABI was slightly reduced bilaterally with evidence of focal stenosis in the left SFA and diffuse nonobstructive disease in the right SFA. She was started with Pletal with some improvement in claudication. However, she did not tolerate the medication due to diarrhea. Claudication was not lifestyle limiting and thus she was treated medically.  She is intolerant to statins.  She has noted some episodes of palpitations and fluttering in her neck recently.  Her EKG today shows atrial fibrillation which is a new diagnosis.  She denies chest pain or shortness of breath.  No leg edema or dizziness.  She has no prior history of stroke or congestive heart failure.  Past Medical History:  Diagnosis Date  . Hyperlipidemia   . Hypertension   . Osteoporosis   . Post-menopausal    HRT    Past Surgical History:  Procedure Laterality Date  . ABDOMINAL HYSTERECTOMY    . BREAST LUMPECTOMY  1975     Current Outpatient Medications  Medication Sig Dispense Refill  . Ascorbic Acid (VITAMIN C PO) Take 1 tablet by mouth daily. Pt unsure of how many mgs.    . calcium carbonate (OS-CAL) 600 MG TABS Take 600 mg by mouth daily.      . Cholecalciferol (VITAMIN D PO) Take 1 capsule by mouth daily. Pt unsure of mgs.    Marland Kitchen glucosamine-chondroitin 500-400 MG tablet Take 1 tablet by mouth 3 (three) times daily.    . Latanoprostene  Bunod (VYZULTA) 0.024 % SOLN Apply 1 drop to eye daily.    Marland Kitchen losartan (COZAAR) 50 MG tablet Take 1 tablet (50 mg total) by mouth daily. 90 tablet 3  . metoprolol succinate (TOPROL-XL) 50 MG 24 hr tablet TAKE 1 TABLET BY MOUTH DAILY 90 tablet 3  . Multiple Vitamin (MULTIVITAMIN) tablet Take 1 tablet by mouth daily.      . Red Yeast Rice 600 MG TABS Take 1 tablet by mouth daily.      . rivaroxaban (XARELTO) 20 MG TABS tablet Take 1 tablet (20 mg total) by mouth daily with supper. 30 tablet 11   No current facility-administered medications for this visit.     Allergies:   Vicodin [hydrocodone-acetaminophen] and Pletaal [cilostazol]    Social History:  The patient  reports that she quit smoking about 45 years ago. Her smoking use included cigarettes. She has a 13.50 pack-year smoking history. She has never used smokeless tobacco. She reports current alcohol use. She reports that she does not use drugs.   Family History:  The patient's family history includes Arthritis in her mother and sister; COPD in her father and sister; Glaucoma in her father; Heart attack (age of onset: 11) in her father; Heart disease in her mother; Hypertension in her sister; Prostate cancer in her father.      PHYSICAL EXAM: VS:  BP Marland Kitchen)  174/90   Pulse 86   Ht 5\' 2"  (1.575 m)   Wt 135 lb (61.2 kg)   BMI 24.69 kg/m  , BMI Body mass index is 24.69 kg/m. GEN: Well nourished, well developed, in no acute distress  HEENT: normal  Neck: no JVD, carotid bruits, or masses Cardiac: Irregularly irregular; no murmurs, rubs, or gallops,no edema  Respiratory:  clear to auscultation bilaterally, normal work of breathing GI: soft, nontender, nondistended, + BS MS: no deformity or atrophy  Skin: warm and dry, no rash Neuro:  Strength and sensation are intact Psych: euthymic mood, full affect Distal pulses are not palpable.   EKG:  EKG is ordered today. The ekg ordered today demonstrates atrial fibrillation with  ventricular rate of 86 bpm.  No significant ST or T wave changes.  Low voltage.   Recent Labs: 09/04/2018: ALT 22; BUN 14; Creatinine, Ser 0.72; Hemoglobin 14.4; Platelets 216.0; Potassium 4.4; Sodium 138    Lipid Panel    Component Value Date/Time   CHOL 216 (H) 09/04/2018 1207   TRIG 102.0 09/04/2018 1207   HDL 62.10 09/04/2018 1207   CHOLHDL 3 09/04/2018 1207   VLDL 20.4 09/04/2018 1207   LDLCALC 134 (H) 09/04/2018 1207   LDLDIRECT 130.5 03/26/2013 0916      Wt Readings from Last 3 Encounters:  01/22/19 135 lb (61.2 kg)  09/04/18 130 lb (59 kg)  05/04/18 126 lb 6.4 oz (57.3 kg)        ASSESSMENT AND PLAN:  1.  Atrial fibrillation: This is newly diagnosed with no prior history.  Chads Vasc score is 5.  Thus, I recommend long-term anticoagulation.  I discussed different options with her and decided to start treatment with Xarelto 20 mg once daily.  I reviewed recent labs done in October which showed normal CBC and renal function. I requested an echocardiogram. I am going to bring her back in 2 months.  If she continues to have intermittent palpitations, it might be worth proceeding with cardioversion.  2. PAD: Currently with minimal claudication.  Continue medical therapy.  2.  Hyperlipidemia: She is intolerant to statins and has been on red yeast Rice.  3. Essential Hypertension: Blood pressure is elevated today but repeat blood pressure at the end of the visit was 138/89.  Her blood pressure is usually not elevated.   Disposition:   FU in 2 months.  Signed, Evelyn Sacramento, MD  01/22/2019 6:00 PM    [place]

## 2019-01-23 NOTE — Addendum Note (Signed)
Addended by: Anselm Pancoast on: 01/23/2019 03:47 PM   Modules accepted: Orders

## 2019-02-05 ENCOUNTER — Ambulatory Visit (HOSPITAL_COMMUNITY): Payer: Medicare Other | Attending: Cardiovascular Disease

## 2019-02-05 DIAGNOSIS — I4891 Unspecified atrial fibrillation: Secondary | ICD-10-CM | POA: Insufficient documentation

## 2019-02-07 ENCOUNTER — Telehealth: Payer: Self-pay | Admitting: *Deleted

## 2019-02-07 NOTE — Telephone Encounter (Signed)
Patient made aware of results and verbalized understanding.  

## 2019-02-07 NOTE — Telephone Encounter (Signed)
-----   Message from Wellington Hampshire, MD sent at 02/07/2019  2:30 PM EDT ----- Inform patient that echo was fine.  Normal ejection fraction with moderately dilated left atrium likely due to atrial fibrillation.

## 2019-02-07 NOTE — Telephone Encounter (Signed)
Left a message for the patient to call back.  

## 2019-02-13 ENCOUNTER — Ambulatory Visit: Payer: Self-pay | Admitting: *Deleted

## 2019-02-13 NOTE — Telephone Encounter (Signed)
Patient calls with noticing her urine appears darker than normal. Noticed this yesterday and once this morning. Also has mild discomfort at the lower left flank area she rates as a 1. Denies fever, frequency, urgency, hesitancy, burning and dribbling, fullness. She began increasing her water intake yesterday. Advised to continue increasing water intake. Reviewed urgent symptoms requiring immediate evaluation. Appointment made for tomorrow with PCP.  Reason for Disposition . Side (flank) or lower back pain present  Answer Assessment - Initial Assessment Questions 1. SYMPTOM: "What's the main symptom you're concerned about?" (e.g., frequency, incontinence)     Dark urine yesterday and once this morning. Has voided twice since early this morning and they have been normal.  2. ONSET: "When did the urine looking darker than normal. start?"    Yesterday. 3. PAIN: "Is there any pain?" If so, ask: "How bad is it?" (Scale: 1-10; mild, moderate, severe)     Discomfort, rating 1 on the scale. 4. CAUSE: "What do you think is causing the symptoms?"     Possible uti 5. OTHER SYMPTOMS: "Do you have any other symptoms?" (e.g., fever, flank pain, blood in urine, pain with urination)     Flank pain yesterday, very mild this morning.  6. PREGNANCY: "Is there any chance you are pregnant?" "When was your last menstrual period?"     no  Protocols used: URINARY [facility]

## 2019-02-13 NOTE — Telephone Encounter (Signed)
Please schedule her in AM session if possible.

## 2019-02-14 ENCOUNTER — Ambulatory Visit (INDEPENDENT_AMBULATORY_CARE_PROVIDER_SITE_OTHER): Payer: Medicare Other | Admitting: Medical

## 2019-02-14 ENCOUNTER — Ambulatory Visit: Payer: Medicare Other | Admitting: Family Medicine

## 2019-02-14 ENCOUNTER — Encounter: Payer: Self-pay | Admitting: Medical

## 2019-02-14 ENCOUNTER — Other Ambulatory Visit: Payer: Self-pay

## 2019-02-14 VITALS — BP 135/70 | HR 101 | Temp 98.5°F | Resp 16 | Ht 62.0 in | Wt 135.0 lb

## 2019-02-14 DIAGNOSIS — M545 Low back pain, unspecified: Secondary | ICD-10-CM

## 2019-02-14 DIAGNOSIS — R0781 Pleurodynia: Secondary | ICD-10-CM

## 2019-02-14 LAB — POC URINALSYSI DIPSTICK (AUTOMATED)
Bilirubin, UA: NEGATIVE
GLUCOSE UA: NEGATIVE
Ketones, UA: NEGATIVE
Leukocytes, UA: NEGATIVE
Nitrite, UA: NEGATIVE
Protein, UA: NEGATIVE
Spec Grav, UA: 1.01 (ref 1.010–1.025)
Urobilinogen, UA: NEGATIVE E.U./dL — AB
pH, UA: 7.5 (ref 5.0–8.0)

## 2019-02-14 MED ORDER — CIPROFLOXACIN HCL 250 MG PO TABS: 250.0000 mg | ORAL_TABLET | Freq: Two times a day (BID) | ORAL | 0 refills | Status: DC

## 2019-02-14 NOTE — Patient Instructions (Addendum)
You do have some recent left lower rib region pain close to the junction of the kidney region.  However on exam the pain seems more rib region then directly over your kidney.  This might be muscle pain and would recommend that you only use Tylenol 1 tablet every 8 hours for the next 4 to 5 days to see if this pain subsides.  If the pain worsens let me know.  If you get any rash or a small blister eruption on the area let me know as well.  I talked with staff and you need to get shingles vaccine through your pharmacy.  Over the next 4 to 5 days if your pain still persists then let me know and in that event would get chest x-ray with rib series.  Currently there is restriction on doing x-ray studies unless it is clearly indicated/emergent.  You do have some blood in your urine.  This might be Xarelto related or possible urinary tract infection related.  We are sending urine out for culture.  If you have worsening symptoms indicating urinary tract infection while culture is pending then you can start Cipro low-dose antibiotic.  I am giving you that prescription as a print prescription.  Use only if needed.  Follow-up in 7 to 10 days or as needed.

## 2019-02-14 NOTE — Progress Notes (Signed)
   Subjective:    Patient ID: Evelyn [name], female    DOB: [birth year], 85 y.o.   MRN: [id number]  HPI  Pt in for acute complaints disclored/darker urine. Last night mild pink color. She does complaint of left side thorax pain for 3 days.(intermittent)    Pt in today reporting possible urinary urinary symptoms as she thought she had pain over kidneys.  Dysuria- no Frequent urination- no Hesitancy-no Suprapubic pressure-no Fever-no chills-no Nausea-no Vomiting-no CVA pain-no History of UTI-no Gross hematuria-no. But pink appearance.  On further discussion some left lower rib area pain on range of motion. This is pain is intermittent. No fever, no cough or sweats. No rash on skin in this area. No burning or vesicle eruption.  Review of Systems  Respiratory: Negative for apnea, cough, chest tightness, shortness of breath and wheezing.   Cardiovascular: Negative for chest pain and palpitations.  Gastrointestinal: Negative for abdominal distention, abdominal pain, constipation and diarrhea.  Genitourinary: Negative for difficulty urinating, dyspareunia, dysuria, frequency, hematuria, pelvic pain and urgency.  Musculoskeletal: Negative for arthralgias and back pain.       More left lower rib area pain. Not in cva area.  Skin: Negative for rash.  Neurological: Negative for dizziness, speech difficulty, weakness and light-headedness.  Hematological: Negative for adenopathy. Does not bruise/bleed easily.  Psychiatric/Behavioral: Negative for behavioral problems, decreased concentration, hallucinations and sleep disturbance. The patient is not nervous/anxious.        Objective:   Physical Exam  General- No acute distress. Pleasant patient. Neck- Full range of motion, no jvd Lungs- Clear, even and unlabored. Heart- regular rate and rhythm. Neurologic- CNII- XII grossly intact.  Back- no cva area pain. Posterior thorax- left side lower rib area pain. Mild on palpation. Mild on range  of motion. No pain in this area on deep inspiration.  Skin- on close inspection no rash on skin.     Assessment & Plan:  You do have some recent left lower rib region pain close to the junction of the kidney region.  However on exam the pain seems more rib region then directly over your kidney.  This might be muscle pain and would recommend that you only use Tylenol 1 tablet every 8 hours for the next 4 to 5 days to see if this pain subsides.  If the pain worsens let me know.  If you get any rash or a small blister eruption on the area let me know as well.  I talked with staff and you need to get shingles vaccine through your pharmacy.  Over the next 4 to 5 days if your pain still persists then let me know and in that event would get chest x-ray with rib series.  Currently there is restriction on doing x-ray studies unless it is clearly indicated/emergent.  You do have some blood in your urine.  This might be Xarelto related or possible urinary tract infection related.  We are sending urine out for culture.  If you have worsening symptoms indicating urinary tract infection while culture is pending then you can start Cipro low-dose antibiotic.  I am giving you that prescription as a print prescription.  Use only if needed.  Follow-up in 7 to 10 days or as needed.  Mackie Pai, PA-C

## 2019-02-15 LAB — URINE CULTURE
MICRO NUMBER:: 337203
Result:: NO GROWTH
SPECIMEN QUALITY:: ADEQUATE

## 2019-02-18 ENCOUNTER — Telehealth: Payer: Self-pay | Admitting: *Deleted

## 2019-02-18 NOTE — Telephone Encounter (Signed)
Patient had called and said she had received call about results.  I advised that we did speak with [place].  She stated that her urine was still dark and pink.  She is in on Xarelto.  Advised that she call cardiologist.

## 2019-02-19 ENCOUNTER — Telehealth: Payer: Self-pay | Admitting: Cardiovascular Disease

## 2019-02-19 NOTE — Telephone Encounter (Signed)
Pt called because she is worried about the color of her urine. She says it has a dark red or pinkish color in the morning, then clear the rest of the day. She is worried that the Xarelto Dr. Fletcher Anon put her on is causing the change in her urine color, because this has never happened before.  No other symptoms, feels fine otherwise.  She went to her PCP thinking she had a UTI or Kidney Stones, but the PCP said she didn't have either. She also said she has been putting Elderberry drops in her Cranberry juice. She doesn't know what is causing the change in urine color.

## 2019-02-19 NOTE — Telephone Encounter (Signed)
Per pt has noted since Friday with first urination urine is dark pink or reddish in color and the rest of day is clear Pt is on Xarelto and had recent u/a done at [facility] which was positive for blood Will forward to Dr Fletcher Anon for review and recommendations ./cy

## 2019-02-19 NOTE — Telephone Encounter (Signed)
Busy signal will try later./cy

## 2019-02-20 MED ORDER — APIXABAN 5 MG PO TABS: 5.0000 mg | ORAL_TABLET | Freq: Two times a day (BID) | ORAL | 11 refills | Status: DC

## 2019-02-20 NOTE — Telephone Encounter (Signed)
Per Pharmacist pt will pay $37.00 for Eliquis same as Xarelto

## 2019-02-20 NOTE — Telephone Encounter (Signed)
Pt aware of recommendations and agrees with plan ./cy 

## 2019-02-20 NOTE — Telephone Encounter (Signed)
Lets switch her to Eliquis 5 mg twice daily and repeat urinalysis in 2 to 3 weeks with her primary care physician.  If blood and urine persist after that, she might need to be seen by urology.

## 2019-03-26 ENCOUNTER — Telehealth (INDEPENDENT_AMBULATORY_CARE_PROVIDER_SITE_OTHER): Payer: Medicare Other | Admitting: Cardiology

## 2019-03-26 ENCOUNTER — Telehealth: Payer: Self-pay

## 2019-03-26 ENCOUNTER — Encounter: Payer: Self-pay | Admitting: Cardiology

## 2019-03-26 VITALS — Ht 62.0 in | Wt 135.0 lb

## 2019-03-26 DIAGNOSIS — I739 Peripheral vascular disease, unspecified: Secondary | ICD-10-CM

## 2019-03-26 DIAGNOSIS — E785 Hyperlipidemia, unspecified: Secondary | ICD-10-CM

## 2019-03-26 DIAGNOSIS — I480 Paroxysmal atrial fibrillation: Secondary | ICD-10-CM

## 2019-03-26 DIAGNOSIS — I4819 Other persistent atrial fibrillation: Secondary | ICD-10-CM | POA: Insufficient documentation

## 2019-03-26 DIAGNOSIS — Z7901 Long term (current) use of anticoagulants: Secondary | ICD-10-CM

## 2019-03-26 DIAGNOSIS — I4891 Unspecified atrial fibrillation: Secondary | ICD-10-CM | POA: Insufficient documentation

## 2019-03-26 DIAGNOSIS — I10 Essential (primary) hypertension: Secondary | ICD-10-CM

## 2019-03-26 NOTE — Telephone Encounter (Signed)
Unable to reach patient. I have called every number in her chart and left messages for patient to contact about her appointment.

## 2019-03-26 NOTE — Telephone Encounter (Signed)
Contacted patient to discuss AVS Instructions. Patient voiced understanding and thanked me for my call and all my help.

## 2019-03-26 NOTE — Telephone Encounter (Signed)
Virtual Visit Pre-Appointment Phone Call  "[name], I am calling you today to discuss your upcoming appointment. We are currently trying to limit exposure to the virus that causes COVID-19 by seeing patients at home rather than in the office."  1. "What is the BEST phone number to call the day of the visit?" - include this in appointment notes  2. "Do you have or have access to (through a family member/friend) a smartphone with video capability that we can use for your visit?" a. If yes - list this number in appt notes as "cell" (if different from BEST phone #) and list the appointment type as a VIDEO visit in appointment notes b. If no - list the appointment type as a PHONE visit in appointment notes  3. Confirm consent - "In the setting of the current Covid19 crisis, you are scheduled for a PHONE visit with your provider on 03/26/2019 at [facility]).  Just as we do with many in-office visits, in order for you to participate in this visit, we must obtain consent.  If you'd like, I can send this to your mychart (if signed up) or email for you to review.  Otherwise, I can obtain your verbal consent now.  All virtual visits are billed to your insurance company just like a normal visit would be.  By agreeing to a virtual visit, we'd like you to understand that the technology does not allow for your provider to perform an examination, and thus may limit your provider's ability to fully assess your condition. If your provider identifies any concerns that need to be evaluated in person, we will make arrangements to do so.  Finally, though the technology is pretty good, we cannot assure that it will always work on either your or our end, and in the setting of a video visit, we may have to convert it to a phone-only visit.  In either situation, we cannot ensure that we have a secure connection.  Are you willing to proceed?" STAFF: Did the patient verbally acknowledge consent to telehealth visit?  Document YES/NO here: YES  4. Advise patient to be prepared - "Two hours prior to your appointment, go ahead and check your blood pressure, pulse, oxygen saturation, and your weight (if you have the equipment to check those) and write them all down. When your visit starts, your provider will ask you for this information. If you have an Apple Watch or Kardia device, please plan to have heart rate information ready on the day of your appointment. Please have a pen and paper handy nearby the day of the visit as well."  5. Give patient instructions for MyChart download to smartphone OR Doximity/Doxy.me as below if video visit (depending on what platform provider is using)  6. Inform patient they will receive a phone call 15 minutes prior to their appointment time (may be from unknown caller ID) so they should be prepared to answer    TELEPHONE CALL NOTE  Britzy Graul has been deemed a candidate for a follow-up tele-health visit to limit community exposure during the Covid-19 pandemic. I spoke with the patient via phone to ensure availability of phone/video source, confirm preferred email & phone number, and discuss instructions and expectations.  I reminded Evelyn [name] to be prepared with any vital sign and/or heart rhythm information that could potentially be obtained via home monitoring, at the time of her visit. I reminded Evelyn [name] to expect a  phone call prior to her visit.  Harold Hedge, CMA 03/26/2019 3:48 PM   INSTRUCTIONS FOR DOWNLOADING THE MYCHART APP TO SMARTPHONE  - The patient must first make sure to have activated MyChart and know their login information - If Apple, go to [employer] and type in MyChart in the search bar and download the app. If Android, ask patient to go to [employer] and type in [place] in the search bar and download the app. The app is free but as with any other app downloads, their phone may require them to verify saved payment information or  Apple/Android password.  - The patient will need to then log into the app with their MyChart username and password, and select [facility] as their healthcare provider to link the account. When it is time for your visit, go to the MyChart app, find appointments, and click Begin Video Visit. Be sure to Select Allow for your device to access the Microphone and Camera for your visit. You will then be connected, and your provider will be with you shortly.  **If they have any issues connecting, or need assistance please contact MyChart service desk [phone])**  **If using a computer, in order to ensure the best quality for their visit they will need to use either of the following Internet Browsers: [employer], or Google Chrome**  IF USING DOXIMITY or DOXY.ME - The patient will receive a link just prior to their visit by text.     FULL LENGTH CONSENT FOR TELE-HEALTH VISIT   I hereby voluntarily request, consent and authorize [facility] and its employed or contracted physicians, physician assistants, nurse practitioners or other licensed health care professionals (the Practitioner), to provide me with telemedicine health care services (the "Services") as deemed necessary by the treating Practitioner. I acknowledge and consent to receive the Services by the Practitioner via telemedicine. I understand that the telemedicine visit will involve communicating with the Practitioner through live audiovisual communication technology and the disclosure of certain medical information by electronic transmission. I acknowledge that I have been given the opportunity to request an in-person assessment or other available alternative prior to the telemedicine visit and am voluntarily participating in the telemedicine visit.  I understand that I have the right to withhold or withdraw my consent to the use of telemedicine in the course of my care at any time, without affecting my right to future care  or treatment, and that the Practitioner or I may terminate the telemedicine visit at any time. I understand that I have the right to inspect all information obtained and/or recorded in the course of the telemedicine visit and may receive copies of available information for a reasonable fee.  I understand that some of the potential risks of receiving the Services via telemedicine include:  Marland Kitchen Delay or interruption in medical evaluation due to technological equipment failure or disruption; . Information transmitted may not be sufficient (e.g. poor resolution of images) to allow for appropriate medical decision making by the Practitioner; and/or  . In rare instances, security protocols could fail, causing a breach of personal health information.  Furthermore, I acknowledge that it is my responsibility to provide information about my medical history, conditions and care that is complete and accurate to the best of my ability. I acknowledge that Practitioner's advice, recommendations, and/or decision may be based on factors not within their control, such as incomplete or inaccurate data provided by me or distortions of diagnostic images or specimens that may  result from electronic transmissions. I understand that the practice of medicine is not an exact science and that Practitioner makes no warranties or guarantees regarding treatment outcomes. I acknowledge that I will receive a copy of this consent concurrently upon execution via email to the email address I last provided but may also request a printed copy by calling the office of [facility].    I understand that my insurance will be billed for this visit.   I have read or had this consent read to me. . I understand the contents of this consent, which adequately explains the benefits and risks of the Services being provided via telemedicine.  . I have been provided ample opportunity to ask questions regarding this consent and the Services and have had  my questions answered to my satisfaction. . I give my informed consent for the services to be provided through the use of telemedicine in my medical care  By participating in this telemedicine visit I agree to the above.

## 2019-03-26 NOTE — Patient Instructions (Signed)
Medication Instructions:  Your physician recommends that you continue on your current medications as directed. Please refer to the Current Medication list given to you today. If you need a refill on your cardiac medications before your next appointment, please call your pharmacy.   Lab work: None  If you have labs (blood work) drawn today and your tests are completely normal, you will receive your results only by: Marland Kitchen MyChart Message (if you have MyChart) OR . A paper copy in the mail If you have any lab test that is abnormal or we need to change your treatment, we will call you to review the results.  Testing/Procedures: None   Follow-Up: At [facility], you and your health needs are our priority.  As part of our continuing mission to provide you with exceptional heart care, we have created designated Provider Care Teams.  These Care Teams include your primary Cardiologist (physician) and Advanced Practice Providers (APPs -  Physician Assistants and Nurse Practitioners) who all work together to provide you with the care you need, when you need it. You will need a follow up appointment in 4 months. You may see [name], MD or one of the following Advanced Practice Providers on your designated Care Team:   Kerin Ransom, PA-C Roby Lofts, [place] . Sande Rives, PA-C  Any Other Special Instructions Will Be Listed Below (If Applicable).

## 2019-03-26 NOTE — Telephone Encounter (Signed)
Follow up   Patient is calling about virtual visit. Please call the number that is on the contact information.

## 2019-03-26 NOTE — Progress Notes (Signed)
Virtual Visit via Telephone Note   This visit type was conducted due to national recommendations for restrictions regarding the COVID-19 Pandemic (e.g. social distancing) in an effort to limit this patient's exposure and mitigate transmission in our community.  Due to her co-morbid illnesses, this patient is at least at moderate risk for complications without adequate follow up.  This format is felt to be most appropriate for this patient at this time.  The patient did not have access to video technology/had technical difficulties with video requiring transitioning to audio format only (telephone).  All issues noted in this document were discussed and addressed.  No physical exam could be performed with this format.  Please refer to the patient's chart for her  consent to telehealth for [facility]  Evaluation Performed:  Follow-up visit  This visit type was conducted due to national recommendations for restrictions regarding the COVID-19 Pandemic (e.g. social distancing).  This format is felt to be most appropriate for this patient at this time.  All issues noted in this document were discussed and addressed.  No physical exam was performed (except for noted visual exam findings with Video Visits).  Please refer to the patient's chart (MyChart message for video visits and phone note for telephone visits) for the patient's consent to telehealth for [facility].  Date:  03/26/2019   ID:  [name], DOB 1930/07/08, MRN [id number]  Patient Location: Home [address] [place] [address]   Provider location:   [facility]  PCP:  Carollee Herter, Alferd Apa, DO  Cardiologist:   Electrophysiologist:  None   Chief Complaint:  Follow up office visit  History of Present Illness:    Evelyn [name] is a 85 y.o. female who presents via audio/video conferencing for a telehealth visit today.  The patient has a history of peripheral vascular disease.  Prior ABIs have shown left  greater than right SFA stenosis.  She does have claudication but is not felt to be lifestyle limiting.  She is on medical therapy.  Review of her allergies indicates she was intolerant of Pletal in the past.  Other medical problems include hypertension and dyslipidemia with statin intolerance.  Her last office visit was February 25.  At that visit she was noted to be in atrial fibrillation which is new for her.  She was asymptomatic with this.  She was placed on Xarelto.  Echocardiogram showed normal LV function with mild left atrial enlargement.  She developed painless hematuria on Xarelto and this was changed to Eliquis.  Urine culture was negative.  Since being on Eliquis she has had no further hematuria.  Dr. Fletcher Anon did mention she may need a urologic evaluation, I will defer that to her PCP.  I discussed this with the patient today and she is not anxious to pursue this.  She remains asymptomatic from her atrial fibrillation.  The patient does not symptoms concerning for COVID-19 infection (fever, chills, cough, or new SHORTNESS OF BREATH).    Prior CV studies:   The following studies were reviewed today:  Past Medical History:  Diagnosis Date  . Hyperlipidemia   . Hypertension   . Osteoporosis   . Post-menopausal    HRT   Past Surgical History:  Procedure Laterality Date  . ABDOMINAL HYSTERECTOMY    . BREAST LUMPECTOMY  1975     Current Meds  Medication Sig  . apixaban (ELIQUIS) 5 MG TABS tablet Take 1 tablet (5 mg  total) by mouth 2 (two) times daily.  . Ascorbic Acid (VITAMIN C PO) Take 1 tablet by mouth daily. Pt unsure of how many mgs.  . calcium carbonate (OS-CAL) 600 MG TABS Take 600 mg by mouth daily.    . Cholecalciferol (VITAMIN D PO) Take 1 capsule by mouth daily. Pt unsure of mgs.  Marland Kitchen glucosamine-chondroitin 500-400 MG tablet Take 1 tablet by mouth daily.   . Latanoprostene Bunod (VYZULTA) 0.024 % SOLN Apply 1 drop to eye daily.  Marland Kitchen losartan (COZAAR) 50 MG tablet Take 1  tablet (50 mg total) by mouth daily.  . metoprolol succinate (TOPROL-XL) 50 MG 24 hr tablet TAKE 1 TABLET BY MOUTH DAILY  . Omega-3 Fatty Acids (FISH OIL) 1000 MG CAPS Take 1 capsule by mouth daily.  . Red Yeast Rice 600 MG TABS Take 1 tablet by mouth daily.       Allergies:   Vicodin [hydrocodone-acetaminophen]; Pletaal [cilostazol]; and Statins   Social History   Tobacco Use  . Smoking status: Former Smoker    Packs/day: 0.75    Years: 18.00    Pack years: 13.50    Types: Cigarettes    Last attempt to quit: 02/20/1973    Years since quitting: 46.1  . Smokeless tobacco: Never Used  Substance Use Topics  . Alcohol use: Yes  . Drug use: No     Family Hx: The patient's family history includes Arthritis in her mother and sister; COPD in her father and sister; Glaucoma in her father; Heart attack (age of onset: 46) in her father; Heart disease in her mother; Hypertension in her sister; Prostate cancer in her father.  ROS:   Please see the history of present illness.    All other systems reviewed and are negative.   Labs/Other Tests and Data Reviewed:    Recent Labs: 09/04/2018: ALT 22; BUN 14; Creatinine, Ser 0.72; Hemoglobin 14.4; Platelets 216.0; Potassium 4.4; Sodium 138   Recent Lipid Panel Lab Results  Component Value Date/Time   CHOL 216 (H) 09/04/2018 12:07 PM   TRIG 102.0 09/04/2018 12:07 PM   HDL 62.10 09/04/2018 12:07 PM   CHOLHDL 3 09/04/2018 12:07 PM   LDLCALC 134 (H) 09/04/2018 12:07 PM   LDLDIRECT 130.5 03/26/2013 09:16 AM    Wt Readings from Last 3 Encounters:  03/26/19 135 lb (61.2 kg)  02/14/19 135 lb (61.2 kg)  01/22/19 135 lb (61.2 kg)     Exam:    Vital Signs:  Ht 5\' 2"  (1.575 m)   Wt 135 lb (61.2 kg)   BMI 24.69 kg/m    Pt was in no acute distress on the phone  ASSESSMENT & PLAN:    Atrial fibrillation- New onset Feb 2020- she is asymptomatic  PVD- Stable, not lifestyle limiting  Anticoagulation  CHADS VASC=5- changed from Xarelto  to Eliquis secondary to hematuria which has resolved   COVID-19 Education: The signs and symptoms of COVID-19 were discussed with the patient and how to seek care for testing (follow up with PCP or arrange E-visit).  The importance of social distancing was discussed today.  Patient Risk:   After full review of this patients clinical status, I feel that they are at least moderate risk at this time.  Time:   Today, I have spent 15 minutes with the patient with telehealth technology discussing hematuria and atrial fibrillation.     Medication Adjustments/Labs and Tests Ordered: Current medicines are reviewed at length with the patient today.  Concerns regarding medicines  are outlined above.  Tests Ordered: No orders of the defined types were placed in this encounter.  Medication Changes: No orders of the defined types were placed in this encounter.   Disposition:  Dr Fletcher Anon 3 months  Signed, Kerin Ransom, PA-C  03/26/2019 4:15 PM     [facility]

## 2019-04-29 ENCOUNTER — Ambulatory Visit (INDEPENDENT_AMBULATORY_CARE_PROVIDER_SITE_OTHER): Payer: Medicare Other | Admitting: Family Medicine

## 2019-04-29 ENCOUNTER — Other Ambulatory Visit: Payer: Self-pay

## 2019-04-29 ENCOUNTER — Encounter: Payer: Self-pay | Admitting: Family Medicine

## 2019-04-29 DIAGNOSIS — W57XXXA Bitten or stung by nonvenomous insect and other nonvenomous arthropods, initial encounter: Secondary | ICD-10-CM | POA: Diagnosis not present

## 2019-04-29 DIAGNOSIS — S70361A Insect bite (nonvenomous), right thigh, initial encounter: Secondary | ICD-10-CM | POA: Diagnosis not present

## 2019-04-29 MED ORDER — DOXYCYCLINE HYCLATE 100 MG PO TABS: 100.0000 mg | ORAL_TABLET | Freq: Two times a day (BID) | ORAL | 0 refills | Status: DC

## 2019-04-29 NOTE — Progress Notes (Signed)
Virtual Visit via Telephone Note  I connected with Evelyn [name] on 04/29/19 at 10:20 AM EDT by telephone and verified that I am speaking with the correct person using two identifiers.  Location: Patient: home  Provider: home    I discussed the limitations, risks, security and privacy concerns of performing an evaluation and management service by telephone and the availability of in person appointments. I also discussed with the patient that there may be a patient responsible charge related to this service. The patient expressed understanding and agreed to proceed.   History of Present Illness: Pt is home c/o tick bite x 1  week ago.  She pulled it off last night.  It was on R thigh posteriorly      Observations/Objective:  No vitals obtained  Pt in NAD  Assessment and Plan: 1. Tick bite of thigh, right, initial encounter Pt would like to hold off on labs and inperson visit unless it does not get better  - doxycycline (VIBRA-TABS) 100 MG tablet; Take 1 tablet (100 mg total) by mouth 2 (two) times daily.  Dispense: 20 tablet; Refill: 0  Follow Up Instructions:    I discussed the assessment and treatment plan with the patient. The patient was provided an opportunity to ask questions and all were answered. The patient agreed with the plan and demonstrated an understanding of the instructions.   The patient was advised to call back or seek an in-person evaluation if the symptoms worsen or if the condition fails to improve as anticipated.  I provided 15 minutes of non-face-to-face time during this encounter.   Ann Held, DO

## 2019-05-13 ENCOUNTER — Other Ambulatory Visit: Payer: Self-pay | Admitting: Family Medicine

## 2019-05-13 DIAGNOSIS — I10 Essential (primary) hypertension: Secondary | ICD-10-CM

## 2019-06-06 DIAGNOSIS — H25813 Combined forms of age-related cataract, bilateral: Secondary | ICD-10-CM | POA: Diagnosis not present

## 2019-06-06 DIAGNOSIS — H401122 Primary open-angle glaucoma, left eye, moderate stage: Secondary | ICD-10-CM | POA: Diagnosis not present

## 2019-06-06 DIAGNOSIS — H401111 Primary open-angle glaucoma, right eye, mild stage: Secondary | ICD-10-CM | POA: Diagnosis not present

## 2019-06-16 ENCOUNTER — Other Ambulatory Visit: Payer: Self-pay | Admitting: Family Medicine

## 2019-06-16 DIAGNOSIS — I10 Essential (primary) hypertension: Secondary | ICD-10-CM

## 2019-07-23 ENCOUNTER — Other Ambulatory Visit: Payer: Self-pay

## 2019-07-23 ENCOUNTER — Encounter: Payer: Self-pay | Admitting: Cardiovascular Disease

## 2019-07-23 ENCOUNTER — Ambulatory Visit (INDEPENDENT_AMBULATORY_CARE_PROVIDER_SITE_OTHER): Payer: Medicare Other | Admitting: Cardiovascular Disease

## 2019-07-23 VITALS — BP 138/86 | HR 97 | Ht 62.0 in | Wt 131.8 lb

## 2019-07-23 DIAGNOSIS — I10 Essential (primary) hypertension: Secondary | ICD-10-CM

## 2019-07-23 DIAGNOSIS — I4821 Permanent atrial fibrillation: Secondary | ICD-10-CM

## 2019-07-23 DIAGNOSIS — I4891 Unspecified atrial fibrillation: Secondary | ICD-10-CM | POA: Diagnosis not present

## 2019-07-23 DIAGNOSIS — I739 Peripheral vascular disease, unspecified: Secondary | ICD-10-CM

## 2019-07-23 DIAGNOSIS — E785 Hyperlipidemia, unspecified: Secondary | ICD-10-CM | POA: Diagnosis not present

## 2019-07-23 NOTE — Patient Instructions (Signed)
Medication Instructions:  Your physician recommends that you continue on your current medications as directed. Please refer to the Current Medication list given to you today.  If you need a refill on your cardiac medications before your next appointment, please call your pharmacy.   Lab work: Your provider would like for you to have the following labs today: BMET and CBC  If you have labs (blood work) drawn today and your tests are completely normal, you will receive your results only by: [place] (if you have MyChart) OR A paper copy in the mail If you have any lab test that is abnormal or we need to change your treatment, we will call you to review the results.  Testing/Procedures: None ordered  Follow-Up: At [facility], you and your health needs are our priority.  As part of our continuing mission to provide you with exceptional heart care, we have created designated Provider Care Teams.  These Care Teams include your primary Cardiologist (physician) and Advanced Practice Providers (APPs -  Physician Assistants and Nurse Practitioners) who all work together to provide you with the care you need, when you need it. You will need a follow up appointment in 6 months.  Please call our office 2 months in advance to schedule this appointment.  You may see [name], MD or one of the following Advanced Practice Providers on your designated Care Team:   Kerin Ransom, PA-C [address], PA-C [place], [place]

## 2019-07-23 NOTE — Progress Notes (Signed)
Cardiology Office Note   Date:  07/23/2019   ID:  Evelyn [name], DOB 1930-05-24, MRN [id number]  PCP:  Carollee Herter, Alferd Apa, DO  Cardiologist:   [name], MD   No chief complaint on file.     History of Present Illness: Evelyn [name] is a 85 y.o. female who presents for a followup visit regarding peripheral arterial disease and atrial fibrillation. She has no history of diabetes.  She has chronic medical conditions that include essential hypertension, hyperlipidemia and previous tobacco use.  She was seen in 2014 for  bilateral calf discomfort after walking about 100 feet.  ABI was slightly reduced bilaterally with evidence of focal stenosis in the left SFA and diffuse nonobstructive disease in the right SFA. She was started with Pletal with some improvement in claudication. However, she did not tolerate the medication due to diarrhea. Claudication was not lifestyle limiting and thus she was treated medically.  She is intolerant to statins.  During last visit, she was noted to be in atrial fibrillation.  She was started on Xarelto for anticoagulation.  She had an echocardiogram which showed normal LV systolic function, moderately dilated left atrium and no significant valvular abnormalities.  She had hematuria on Xarelto and was switched to Eliquis with subsequent improvement.  No further bleeding issues.  She has been doing well with no chest pain, shortness of breath or palpitations.  She takes her medications regularly.   Past Medical History:  Diagnosis Date  . Hyperlipidemia   . Hypertension   . Osteoporosis   . Post-menopausal    HRT    Past Surgical History:  Procedure Laterality Date  . ABDOMINAL HYSTERECTOMY    . BREAST LUMPECTOMY  1975     Current Outpatient Medications  Medication Sig Dispense Refill  . apixaban (ELIQUIS) 5 MG TABS tablet Take 1 tablet (5 mg total) by mouth 2 (two) times daily. 60 tablet 11  . Ascorbic Acid (VITAMIN C PO) Take 1 tablet by  mouth daily. Pt unsure of how many mgs.    . calcium carbonate (OS-CAL) 600 MG TABS Take 600 mg by mouth daily.      . Cholecalciferol (VITAMIN D PO) Take 1 capsule by mouth daily. Pt unsure of mgs.    . doxycycline (VIBRA-TABS) 100 MG tablet Take 1 tablet (100 mg total) by mouth 2 (two) times daily. 20 tablet 0  . glucosamine-chondroitin 500-400 MG tablet Take 1 tablet by mouth daily.     . Latanoprostene Bunod (VYZULTA) 0.024 % SOLN Apply 1 drop to eye daily.    Marland Kitchen losartan (COZAAR) 50 MG tablet Take 1 tablet (50 mg total) by mouth daily. 90 tablet 3  . metoprolol succinate (TOPROL-XL) 50 MG 24 hr tablet TAKE 1 TABLET BY MOUTH DAILY 90 tablet 1  . Omega-3 Fatty Acids (FISH OIL) 1000 MG CAPS Take 1 capsule by mouth daily.    . Red Yeast Rice 600 MG TABS Take 1 tablet by mouth daily.       No current facility-administered medications for this visit.     Allergies:   Vicodin [hydrocodone-acetaminophen], Pletaal [cilostazol], and Statins    Social History:  The patient  reports that she quit smoking about 46 years ago. Her smoking use included cigarettes. She has a 13.50 pack-year smoking history. She has never used smokeless tobacco. She reports current alcohol use. She reports that she does not use drugs.   Family History:  The patient's family history  includes Arthritis in her mother and sister; COPD in her father and sister; Glaucoma in her father; Heart attack (age of onset: 50) in her father; Heart disease in her mother; Hypertension in her sister; Prostate cancer in her father.      PHYSICAL EXAM: VS:  BP 138/86   Pulse 97   Ht 5\' 2"  (1.575 m)   Wt 131 lb 12.8 oz (59.8 kg)   SpO2 97%   BMI 24.11 kg/m  , BMI Body mass index is 24.11 kg/m. GEN: Well nourished, well developed, in no acute distress  HEENT: normal  Neck: no JVD, carotid bruits, or masses Cardiac: Irregularly irregular; no murmurs, rubs, or gallops,no edema  Respiratory:  clear to auscultation bilaterally, normal  work of breathing GI: soft, nontender, nondistended, + BS MS: no deformity or atrophy  Skin: warm and dry, no rash Neuro:  Strength and sensation are intact Psych: euthymic mood, full affect Distal pulses are not palpable.   EKG:  EKG is ordered today. The ekg ordered today demonstrates atrial fibrillation with a ventricular rate of 97 bpm.  Low voltage nonspecific T wave changes.   Recent Labs: 09/04/2018: ALT 22; BUN 14; Creatinine, Ser 0.72; Hemoglobin 14.4; Platelets 216.0; Potassium 4.4; Sodium 138    Lipid Panel    Component Value Date/Time   CHOL 216 (H) 09/04/2018 1207   TRIG 102.0 09/04/2018 1207   HDL 62.10 09/04/2018 1207   CHOLHDL 3 09/04/2018 1207   VLDL 20.4 09/04/2018 1207   LDLCALC 134 (H) 09/04/2018 1207   LDLDIRECT 130.5 03/26/2013 0916      Wt Readings from Last 3 Encounters:  07/23/19 131 lb 12.8 oz (59.8 kg)  03/26/19 135 lb (61.2 kg)  02/14/19 135 lb (61.2 kg)        ASSESSMENT AND PLAN:  1.  Permanent atrial fibrillation:Chads Vasc score is 5.  She is tolerating anticoagulation with Eliquis.  I requested CBC and basic metabolic profile.  She continues to be asymptomatic from atrial fibrillation and thus I recommend continuing rate control with Toprol.   2. PAD: Currently with minimal claudication.  Continue medical therapy.  2.  Hyperlipidemia: She is intolerant to statins and has been on red yeast Rice.  3. Essential Hypertension: Blood pressure is controlled on current medication.   Disposition:   FU in 6 months.  Signed, [name], MD  07/23/2019 12:18 PM    Evelyn [name]

## 2019-07-24 LAB — BASIC METABOLIC PANEL
BUN/Creatinine Ratio: 16 (ref 12–28)
BUN: 13 mg/dL (ref 8–27)
CO2: 26 mmol/L (ref 20–29)
Calcium: 10 mg/dL (ref 8.7–10.3)
Chloride: 100 mmol/L (ref 96–106)
Creatinine, Ser: 0.8 mg/dL (ref 0.57–1.00)
GFR calc Af Amer: 77 mL/min/{1.73_m2} (ref >59)
GFR calc non Af Amer: 67 mL/min/{1.73_m2} (ref >59)
Glucose: 81 mg/dL (ref 65–99)
Potassium: 4.9 mmol/L (ref 3.5–5.2)
Sodium: 141 mmol/L (ref 134–144)

## 2019-07-24 LAB — CBC
Hematocrit: 42.3 % (ref 34.0–46.6)
Hemoglobin: 13.9 g/dL (ref 11.1–15.9)
MCH: 30.3 pg (ref 26.6–33.0)
MCHC: 32.9 g/dL (ref 31.5–35.7)
MCV: 92 fL (ref 79–97)
Platelets: 194 10*3/uL (ref 150–450)
RBC: 4.58 x10E6/uL (ref 3.77–5.28)
RDW: 12.6 % (ref 11.7–15.4)
WBC: 7.6 10*3/uL (ref 3.4–10.8)

## 2019-07-25 ENCOUNTER — Other Ambulatory Visit (INDEPENDENT_AMBULATORY_CARE_PROVIDER_SITE_OTHER): Payer: Medicare Other

## 2019-07-25 DIAGNOSIS — E785 Hyperlipidemia, unspecified: Secondary | ICD-10-CM | POA: Diagnosis not present

## 2019-07-25 NOTE — Progress Notes (Signed)
ekg 

## 2019-08-06 ENCOUNTER — Other Ambulatory Visit: Payer: Self-pay | Admitting: Family Medicine

## 2019-08-06 DIAGNOSIS — I10 Essential (primary) hypertension: Secondary | ICD-10-CM

## 2019-09-09 DIAGNOSIS — H18832 Recurrent erosion of cornea, left eye: Secondary | ICD-10-CM | POA: Diagnosis not present

## 2019-09-09 DIAGNOSIS — H401111 Primary open-angle glaucoma, right eye, mild stage: Secondary | ICD-10-CM | POA: Diagnosis not present

## 2019-09-09 DIAGNOSIS — H25813 Combined forms of age-related cataract, bilateral: Secondary | ICD-10-CM | POA: Diagnosis not present

## 2019-09-09 DIAGNOSIS — H401122 Primary open-angle glaucoma, left eye, moderate stage: Secondary | ICD-10-CM | POA: Diagnosis not present

## 2019-09-12 ENCOUNTER — Encounter: Payer: Medicare Other | Admitting: Family Medicine

## 2019-10-07 ENCOUNTER — Other Ambulatory Visit: Payer: Self-pay

## 2019-10-08 ENCOUNTER — Other Ambulatory Visit: Payer: Self-pay

## 2019-10-08 ENCOUNTER — Encounter: Payer: Self-pay | Admitting: Family Medicine

## 2019-10-08 ENCOUNTER — Ambulatory Visit (INDEPENDENT_AMBULATORY_CARE_PROVIDER_SITE_OTHER): Payer: Medicare Other | Admitting: Family Medicine

## 2019-10-08 VITALS — BP 160/90 | HR 109 | Temp 97.6°F | Resp 18 | Ht 62.0 in | Wt 127.8 lb

## 2019-10-08 DIAGNOSIS — Z23 Encounter for immunization: Secondary | ICD-10-CM

## 2019-10-08 DIAGNOSIS — I10 Essential (primary) hypertension: Secondary | ICD-10-CM | POA: Diagnosis not present

## 2019-10-08 DIAGNOSIS — N6459 Other signs and symptoms in breast: Secondary | ICD-10-CM

## 2019-10-08 DIAGNOSIS — Z0000 Encounter for general adult medical examination without abnormal findings: Secondary | ICD-10-CM

## 2019-10-08 LAB — COMPREHENSIVE METABOLIC PANEL
ALT: 19 U/L (ref 0–35)
AST: 22 U/L (ref 0–37)
Albumin: 4.3 g/dL (ref 3.5–5.2)
Alkaline Phosphatase: 62 U/L (ref 39–117)
BUN: 12 mg/dL (ref 6–23)
CO2: 31 mEq/L (ref 19–32)
Calcium: 9.6 mg/dL (ref 8.4–10.5)
Chloride: 100 mEq/L (ref 96–112)
Creatinine, Ser: 0.65 mg/dL (ref 0.40–1.20)
GFR: 86.07 mL/min (ref >60.00)
Glucose, Bld: 96 mg/dL (ref 70–99)
Potassium: 4.3 mEq/L (ref 3.5–5.1)
Sodium: 139 mEq/L (ref 135–145)
Total Bilirubin: 0.9 mg/dL (ref 0.2–1.2)
Total Protein: 7.8 g/dL (ref 6.0–8.3)

## 2019-10-08 LAB — LIPID PANEL
Cholesterol: 201 mg/dL — ABNORMAL HIGH (ref 0–200)
HDL: 57 mg/dL (ref >39.00)
LDL Cholesterol: 122 mg/dL — ABNORMAL HIGH (ref 0–99)
NonHDL: 143.81
Total CHOL/HDL Ratio: 4
Triglycerides: 107 mg/dL (ref 0.0–149.0)
VLDL: 21.4 mg/dL (ref 0.0–40.0)

## 2019-10-08 MED ORDER — METOPROLOL SUCCINATE ER 100 MG PO TB24: 100.0000 mg | ORAL_TABLET | Freq: Every day | ORAL | 3 refills | Status: DC

## 2019-10-08 NOTE — Patient Instructions (Signed)
Preventive Care 38 Years and Older, Female Preventive care refers to lifestyle choices and visits with your health care provider that can promote health and wellness. This includes:  A yearly physical exam. This is also called an annual well check.  Regular dental and eye exams.  Immunizations.  Screening for certain conditions.  Healthy lifestyle choices, such as diet and exercise. What can I expect for my preventive care visit? Physical exam Your health care provider will check:  Height and weight. These may be used to calculate body mass index (BMI), which is a measurement that tells if you are at a healthy weight.  Heart rate and blood pressure.  Your skin for abnormal spots. Counseling Your health care provider may ask you questions about:  Alcohol, tobacco, and drug use.  Emotional well-being.  Home and relationship well-being.  Sexual activity.  Eating habits.  History of falls.  Memory and ability to understand (cognition).  Work and work Statistician.  Pregnancy and menstrual history. What immunizations do I need?  Influenza (flu) vaccine  This is recommended every year. Tetanus, diphtheria, and pertussis (Tdap) vaccine  You may need a Td booster every 10 years. Varicella (chickenpox) vaccine  You may need this vaccine if you have not already been vaccinated. Zoster (shingles) vaccine  You may need this after age 33. Pneumococcal conjugate (PCV13) vaccine  One dose is recommended after age 33. Pneumococcal polysaccharide (PPSV23) vaccine  One dose is recommended after age 72. Measles, mumps, and rubella (MMR) vaccine  You may need at least one dose of MMR if you were born in 1957 or later. You may also need a second dose. Meningococcal conjugate (MenACWY) vaccine  You may need this if you have certain conditions. Hepatitis A vaccine  You may need this if you have certain conditions or if you travel or work in places where you may be exposed  to hepatitis A. Hepatitis B vaccine  You may need this if you have certain conditions or if you travel or work in places where you may be exposed to hepatitis B. Haemophilus influenzae type b (Hib) vaccine  You may need this if you have certain conditions. You may receive vaccines as individual doses or as more than one vaccine together in one shot (combination vaccines). Talk with your health care provider about the risks and benefits of combination vaccines. What tests do I need? Blood tests  Lipid and cholesterol levels. These may be checked every 5 years, or more frequently depending on your overall health.  Hepatitis C test.  Hepatitis B test. Screening  Lung cancer screening. You may have this screening every year starting at age 39 if you have a 30-pack-year history of smoking and currently smoke or have quit within the past 15 years.  Colorectal cancer screening. All adults should have this screening starting at age 36 and continuing until age 15. Your health care provider may recommend screening at age 23 if you are at increased risk. You will have tests every 1-10 years, depending on your results and the type of screening test.  Diabetes screening. This is done by checking your blood sugar (glucose) after you have not eaten for a while (fasting). You may have this done every 1-3 years.  Mammogram. This may be done every 1-2 years. Talk with your health care provider about how often you should have regular mammograms.  BRCA-related cancer screening. This may be done if you have a family history of breast, ovarian, tubal, or peritoneal cancers.  Other tests  Sexually transmitted disease (STD) testing.  Bone density scan. This is done to screen for osteoporosis. You may have this done starting at age 76. Follow these instructions at home: Eating and drinking  Eat a diet that includes fresh fruits and vegetables, whole grains, lean protein, and low-fat dairy products. Limit  your intake of foods with high amounts of sugar, saturated fats, and salt.  Take vitamin and mineral supplements as recommended by your health care provider.  Do not drink alcohol if your health care provider tells you not to drink.  If you drink alcohol: ? Limit how much you have to 0-1 drink a day. ? Be aware of how much alcohol is in your drink. In the [place] one drink equals one 12 oz bottle of beer (355 mL), one 5 oz glass of wine (148 mL), or one 1 oz glass of hard liquor (44 mL). Lifestyle  Take daily care of your teeth and gums.  Stay active. Exercise for at least 30 minutes on 5 or more days each week.  Do not use any products that contain nicotine or tobacco, such as cigarettes, e-cigarettes, and chewing tobacco. If you need help quitting, ask your health care provider.  If you are sexually active, practice safe sex. Use a condom or other form of protection in order to prevent STIs (sexually transmitted infections).  Talk with your health care provider about taking a low-dose aspirin or statin. What's next?  Go to your health care provider once a year for a well check visit.  Ask your health care provider how often you should have your eyes and teeth checked.  Stay up to date on all vaccines. This information is not intended to replace advice given to you by your health care provider. Make sure you discuss any questions you have with your health care provider. Document Released: 12/11/2015 Document Revised: 11/08/2018 Document Reviewed: 11/08/2018 Elsevier Patient Education  2020 [employer].

## 2019-10-08 NOTE — Progress Notes (Signed)
Subjective:     Evelyn [name] is a 85 y.o. female and is here for a comprehensive physical exam. The patient reports no problems.  Social History   Socioeconomic History  . Marital status: Widowed    Spouse name: Not on file  . Number of children: Not on file  . Years of education: Not on file  . Highest education level: Not on file  Occupational History  . Not on file  Social Needs  . Financial resource strain: Not on file  . Food insecurity    Worry: Not on file    Inability: Not on file  . Transportation needs    Medical: Not on file    Non-medical: Not on file  Tobacco Use  . Smoking status: Former Smoker    Packs/day: 0.75    Years: 18.00    Pack years: 13.50    Types: Cigarettes    Quit date: 02/20/1973    Years since quitting: 46.6  . Smokeless tobacco: Never Used  Substance and Sexual Activity  . Alcohol use: Yes  . Drug use: No  . Sexual activity: Yes    Partners: Male  Lifestyle  . Physical activity    Days per week: Not on file    Minutes per session: Not on file  . Stress: Not on file  Relationships  . Social Herbalist on phone: Not on file    Gets together: Not on file    Attends religious service: Not on file    Active member of club or organization: Not on file    Attends meetings of clubs or organizations: Not on file    Relationship status: Not on file  . Intimate partner violence    Fear of current or ex partner: Not on file    Emotionally abused: Not on file    Physically abused: Not on file    Forced sexual activity: Not on file  Other Topics Concern  . Not on file  Social History Narrative   Exercise--no   Health Maintenance  Topic Date Due  . MAMMOGRAM  10/15/2015  . TETANUS/TDAP  08/04/2021  . INFLUENZA VACCINE  Completed  . DEXA SCAN  Completed  . PNA vac Low Risk Adult  Completed    The following portions of the patient's history were reviewed and updated as appropriate:  She  has a past medical history of  Hyperlipidemia, Hypertension, Osteoporosis, and Post-menopausal. She does not have any pertinent problems on file. She  has a past surgical history that includes Abdominal hysterectomy and Breast lumpectomy (1975). Her family history includes Arthritis in her mother and sister; COPD in her father and sister; Glaucoma in her father; Heart attack (age of onset: 68) in her father; Heart disease in her mother; Hypertension in her sister; Prostate cancer in her father. She  reports that she quit smoking about 46 years ago. Her smoking use included cigarettes. She has a 13.50 pack-year smoking history. She has never used smokeless tobacco. She reports current alcohol use. She reports that she does not use drugs. She has a current medication list which includes the following prescription(s): apixaban, ascorbic acid, calcium carbonate, vitamin d, glucosamine-chondroitin, latanoprostene bunod, losartan, metoprolol succinate, fish oil, and red yeast rice. Current Outpatient Medications on File Prior to Visit  Medication Sig Dispense Refill  . apixaban (ELIQUIS) 5 MG TABS tablet Take 1 tablet (5 mg total) by mouth 2 (two) times daily. 60 tablet 11  . Ascorbic  Acid (VITAMIN C PO) Take 1 tablet by mouth daily. Pt unsure of how many mgs.    . calcium carbonate (OS-CAL) 600 MG TABS Take 600 mg by mouth daily.      . Cholecalciferol (VITAMIN D PO) Take 1 capsule by mouth daily. Pt unsure of mgs.    Marland Kitchen glucosamine-chondroitin 500-400 MG tablet Take 1 tablet by mouth daily.     . Latanoprostene Bunod (VYZULTA) 0.024 % SOLN Apply 1 drop to eye daily.    Marland Kitchen losartan (COZAAR) 50 MG tablet TAKE 1 TABLET BY MOUTH EVERY DAY 90 tablet 3  . metoprolol succinate (TOPROL-XL) 50 MG 24 hr tablet TAKE 1 TABLET BY MOUTH DAILY 90 tablet 1  . Omega-3 Fatty Acids (FISH OIL) 1000 MG CAPS Take 1 capsule by mouth daily.    . Red Yeast Rice 600 MG TABS Take 1 tablet by mouth daily.       No current facility-administered medications on  file prior to visit.    She is allergic to vicodin [hydrocodone-acetaminophen]; pletaal [cilostazol]; and statins..  Review of Systems Review of Systems  Constitutional: Negative for activity change, appetite change and fatigue.  HENT: Negative for hearing loss, congestion, tinnitus and ear discharge.  dentist q9m Eyes: Negative for visual disturbance (see optho q1y -- vision corrected to 20/20 with glasses).  Respiratory: Negative for cough, chest tightness and shortness of breath.   Cardiovascular: Negative for chest pain, palpitations and leg swelling.  Gastrointestinal: Negative for abdominal pain, diarrhea, constipation and abdominal distention.  Genitourinary: Negative for urgency, frequency, decreased urine volume and difficulty urinating.  Musculoskeletal: Negative for back pain, arthralgias and gait problem.  Skin: Negative for color change, pallor and rash.  Neurological: Negative for dizziness, light-headedness, numbness and headaches.  Hematological: Negative for adenopathy. Does not bruise/bleed easily.  Psychiatric/Behavioral: Negative for suicidal ideas, confusion, sleep disturbance, self-injury, dysphoric mood, decreased concentration and agitation.       Objective:    BP (!) 160/98 (BP Location: Right Arm, Patient Position: Sitting, Cuff Size: Normal)   Pulse (!) 109   Temp 97.6 F (36.4 C) (Temporal)   Resp 18   Ht 5\' 2"  (1.575 m)   Wt 127 lb 12.8 oz (58 kg)   SpO2 97%   BMI 23.37 kg/m  General appearance: alert, cooperative, appears stated age and no distress Head: Normocephalic, without obvious abnormality, atraumatic Eyes: negative findings: lids and lashes normal, conjunctivae and sclerae normal and pupils equal, round, reactive to light and accomodation Ears: normal TM's and external ear canals both ears Neck: no adenopathy, no carotid bruit, no JVD, supple, symmetrical, trachea midline and thyroid not enlarged, symmetric, no  tenderness/mass/nodules Back: symmetric, no curvature. ROM normal. No CVA tenderness. Lungs: clear to auscultation bilaterally Breasts: b/l breatst thickening laterally Heart: regular rate and rhythm, S1, S2 normal, no murmur, click, rub or gallop Abdomen: soft, non-tender; bowel sounds normal; no masses,  no organomegaly Pelvic: not indicated; status post hysterectomy, negative ROS Extremities: extremities normal, atraumatic, no cyanosis or edema Pulses: 2+ and symmetric Skin: Skin color, texture, turgor normal. No rashes or lesions Lymph nodes: Cervical, supraclavicular, and axillary nodes normal. Neurologic: Alert and oriented X 3, normal strength and tone. Normal symmetric reflexes. Normal coordination and gait    Assessment:    Healthy female exam.      Plan:     ghm utd Check labs See After Visit Summary for Counseling Recommendations    1. Need for influenza vaccination  - Flu Vaccine QUAD  High Dose(Fluad)  2. Essential hypertension Poorly controlled will alter medications, encouraged DASH diet, minimize caffeine and obtain adequate sleep. Report concerning symptoms and follow up as directed and as needed - Lipid panel - Comprehensive metabolic panel - metoprolol succinate (TOPROL-XL) 100 MG 24 hr tablet; Take 1 tablet (100 mg total) by mouth daily. Take with or immediately following a meal.  Dispense: 90 tablet; Refill: 3  3. Preventative health care See above  - Lipid panel - Comprehensive metabolic panel  4. Breast thickening  - MM Digital Diagnostic Bilat; Future

## 2019-10-09 ENCOUNTER — Telehealth: Payer: Self-pay | Admitting: *Deleted

## 2019-10-09 NOTE — Telephone Encounter (Signed)
Copied from [place] [id number]. Topic: General - Other >> Oct 09, 2019  9:04 AM Rainey Pines A wrote: Patient would like a callback from nurse in regards to how to take her metoprolol succinate (TOPROL-XL) 100 MG 24 hr tablet

## 2019-10-09 NOTE — Telephone Encounter (Signed)
Confirmed with patient that she is only supposed to be taking 1 tablet of the 100 MG Metoprolol.

## 2019-10-16 DIAGNOSIS — L820 Inflamed seborrheic keratosis: Secondary | ICD-10-CM | POA: Diagnosis not present

## 2019-10-29 ENCOUNTER — Ambulatory Visit: Payer: Medicare Other

## 2019-10-31 ENCOUNTER — Ambulatory Visit: Payer: Medicare Other

## 2019-11-07 ENCOUNTER — Ambulatory Visit (INDEPENDENT_AMBULATORY_CARE_PROVIDER_SITE_OTHER): Payer: Medicare Other

## 2019-11-07 ENCOUNTER — Other Ambulatory Visit: Payer: Self-pay

## 2019-11-07 VITALS — BP 162/90 | HR 56

## 2019-11-07 DIAGNOSIS — I10 Essential (primary) hypertension: Secondary | ICD-10-CM

## 2019-11-07 NOTE — Progress Notes (Addendum)
Pt here for Blood pressure check per PCP.   Pt currently takes: Losartan 50mg  Daily   Metoprolol 100mg  Daily  Pt reports compliance with medication. Took at 0900 today.   BP at home this morning=156/87  BP today @ 1120 (left arm)=162/90 HR =56  BP today @ 1125 (right arm)=160/90  Pt advised per Dr. Etter Sjogren to increase Losartan to 100mg  daily. Check BP at home and f/u in 2-3 weeks. Patient verbalized understanding.    Noted Ann Held, DO

## 2019-11-07 NOTE — Patient Instructions (Signed)
Increase Losartan to 100mg  daily Monitor BP at home around the same time daily (after medication).  F/U with Dr. Etter Sjogren by Telephone Visit on Wednesday, 11/27/2019 @ 11am.

## 2019-11-27 ENCOUNTER — Other Ambulatory Visit: Payer: Self-pay

## 2019-11-27 ENCOUNTER — Encounter: Payer: Self-pay | Admitting: Family Medicine

## 2019-11-27 ENCOUNTER — Ambulatory Visit (INDEPENDENT_AMBULATORY_CARE_PROVIDER_SITE_OTHER): Payer: Medicare Other | Admitting: Family Medicine

## 2019-11-27 DIAGNOSIS — I10 Essential (primary) hypertension: Secondary | ICD-10-CM | POA: Diagnosis not present

## 2019-11-27 DIAGNOSIS — J302 Other seasonal allergic rhinitis: Secondary | ICD-10-CM

## 2019-11-27 MED ORDER — LOSARTAN POTASSIUM 100 MG PO TABS: 100.0000 mg | ORAL_TABLET | Freq: Every day | ORAL | 3 refills | Status: DC

## 2019-11-27 NOTE — Assessment & Plan Note (Signed)
Well controlled, no changes to meds. Encouraged heart healthy diet such as the DASH diet and exercise as tolerated.  °

## 2019-11-27 NOTE — Assessment & Plan Note (Signed)
Maybe the cause of the light headedness Pt will try otc antihistamine and let us know if her symptoms do not improve or if they worsen

## 2019-11-27 NOTE — Progress Notes (Signed)
Virtual Visit via Telephone Note  I connected with Evelyn [name] on 11/27/19 at 11:00 AM EST by telephone and verified that I am speaking with the correct person using two identifiers.  Location: Patient: home  Provider: home    I discussed the limitations, risks, security and privacy concerns of performing an evaluation and management service by telephone and the availability of in person appointments. I also discussed with the patient that there may be a patient responsible charge related to this service. The patient expressed understanding and agreed to proceed.   History of Present Illness: Pt is home with c/o light headedness x 4   She is c/o runny nose and allergy symptoms but no fever, cough , no sob  No cp or palpitations   Observations/Objective: 145/70  p 76   Afebrile   Pulse ox 98% RA  Pt is in NAd   Assessment and Plan: 1. Essential hypertension con't metoprolol Refill losartan 100 mg  rto 3 months Well controlled, no changes to meds. Encouraged heart healthy diet such as the DASH diet and exercise as tolerated.  - losartan (COZAAR) 100 MG tablet; Take 1 tablet (100 mg total) by mouth daily.  Dispense: 90 tablet; Refill: 3  2. Seasonal allergies otc antihistamine Call if symptoms do not improve or worsen    Follow Up Instructions:    I discussed the assessment and treatment plan with the patient. The patient was provided an opportunity to ask questions and all were answered. The patient agreed with the plan and demonstrated an understanding of the instructions.   The patient was advised to call back or seek an in-person evaluation if the symptoms worsen or if the condition fails to improve as anticipated.  I provided 15 minutes of non-face-to-face time during this encounter.   Ann Held, DO

## 2020-01-13 ENCOUNTER — Telehealth: Payer: Self-pay | Admitting: Family Medicine

## 2020-01-13 DIAGNOSIS — H401122 Primary open-angle glaucoma, left eye, moderate stage: Secondary | ICD-10-CM | POA: Diagnosis not present

## 2020-01-13 DIAGNOSIS — H401111 Primary open-angle glaucoma, right eye, mild stage: Secondary | ICD-10-CM | POA: Diagnosis not present

## 2020-01-13 NOTE — Telephone Encounter (Signed)
Patient states that blood pressure medication was increased from 50mg  to 100mg , since then patient has experienced an increase of bathroom usage, patient would like to know if that is normal??  Please advise.. patient will be home until 1pm

## 2020-01-14 NOTE — Telephone Encounter (Signed)
Not with losartan Could she have uti--- can go to lab for UA/ culture

## 2020-01-14 NOTE — Telephone Encounter (Signed)
Please advise 

## 2020-01-15 MED ORDER — AMLODIPINE BESYLATE 5 MG PO TABS: 5.0000 mg | ORAL_TABLET | Freq: Every day | ORAL | 2 refills | Status: DC

## 2020-01-15 NOTE — Telephone Encounter (Signed)
See below

## 2020-01-15 NOTE — Telephone Encounter (Signed)
D/c losartan and rx norvasc 5 mg daily #30  1 refills Ov in 2-3 weeks for bp check

## 2020-01-15 NOTE — Telephone Encounter (Signed)
Pt states having small, frequent soft stools, and without warning having to go to bathroom. Pt states feeling fine and no stomach pain. Pt states noticing a difference when the medication was increased.

## 2020-01-15 NOTE — Telephone Encounter (Signed)
Pt notified. New Rx sent

## 2020-02-04 ENCOUNTER — Ambulatory Visit: Payer: Medicare Other | Admitting: Cardiovascular Disease

## 2020-02-11 ENCOUNTER — Ambulatory Visit: Payer: Medicare Other | Admitting: Cardiovascular Disease

## 2020-02-11 ENCOUNTER — Encounter: Payer: Self-pay | Admitting: Cardiovascular Disease

## 2020-02-11 ENCOUNTER — Other Ambulatory Visit: Payer: Self-pay

## 2020-02-11 VITALS — BP 134/80 | HR 82 | Resp 15 | Ht 62.0 in | Wt 124.8 lb

## 2020-02-11 DIAGNOSIS — E785 Hyperlipidemia, unspecified: Secondary | ICD-10-CM | POA: Diagnosis not present

## 2020-02-11 DIAGNOSIS — I739 Peripheral vascular disease, unspecified: Secondary | ICD-10-CM | POA: Diagnosis not present

## 2020-02-11 DIAGNOSIS — I4821 Permanent atrial fibrillation: Secondary | ICD-10-CM

## 2020-02-11 DIAGNOSIS — I10 Essential (primary) hypertension: Secondary | ICD-10-CM | POA: Diagnosis not present

## 2020-02-11 NOTE — Patient Instructions (Signed)

## 2020-02-11 NOTE — Progress Notes (Signed)
Cardiology Office Note   Date:  02/11/2020   ID:  Evelyn [name], DOB May 22, 1930, MRN [id number]  PCP:  Carollee Herter, Alferd Apa, DO  Cardiologist:   [name], MD   No chief complaint on file.     History of Present Illness: Evelyn [name] is a 86 y.o. female who presents for a followup visit regarding peripheral arterial disease and atrial fibrillation.   She has chronic medical conditions that include essential hypertension, hyperlipidemia and previous tobacco use.  She was seen in 2014 for  bilateral calf discomfort after walking about 100 feet.  ABI was slightly reduced bilaterally with evidence of focal stenosis in the left SFA and diffuse nonobstructive disease in the right SFA. She was started with Pletal with some improvement in claudication. However, she did not tolerate the medication due to diarrhea. Claudication was not lifestyle limiting and thus she was treated medically.  She is intolerant to statins.  She was diagnosed with atrial fibrillation in 2020.  She was started on Xarelto for anticoagulation.  She had an echocardiogram which showed normal LV systolic function, moderately dilated left atrium and no significant valvular abnormalities.  She had hematuria on Xarelto and was switched to Eliquis with subsequent improvement.   She has been doing well with no recent chest pain, shortness of breath or palpitations.  She does complain of frequent bowel movements.  Her primary care physician change losartan to amlodipine but she reports that symptoms did not improve.   Past Medical History:  Diagnosis Date  . Hyperlipidemia   . Hypertension   . Osteoporosis   . Post-menopausal    HRT    Past Surgical History:  Procedure Laterality Date  . ABDOMINAL HYSTERECTOMY    . BREAST LUMPECTOMY  1975     Current Outpatient Medications  Medication Sig Dispense Refill  . amLODipine (NORVASC) 5 MG tablet Take 1 tablet (5 mg total) by mouth daily. 30 tablet 2  . apixaban  (ELIQUIS) 5 MG TABS tablet Take 1 tablet (5 mg total) by mouth 2 (two) times daily. 60 tablet 11  . Ascorbic Acid (VITAMIN C PO) Take 1 tablet by mouth daily. Pt unsure of how many mgs.    . calcium carbonate (OS-CAL) 600 MG TABS Take 600 mg by mouth daily.      . Cholecalciferol (VITAMIN D PO) Take 1 capsule by mouth daily. Pt unsure of mgs.    Marland Kitchen glucosamine-chondroitin 500-400 MG tablet Take 1 tablet by mouth daily.     . Latanoprostene Bunod (VYZULTA) 0.024 % SOLN Apply 1 drop to eye daily.    . metoprolol succinate (TOPROL-XL) 100 MG 24 hr tablet Take 1 tablet (100 mg total) by mouth daily. Take with or immediately following a meal. 90 tablet 3  . Omega-3 Fatty Acids (FISH OIL) 1000 MG CAPS Take 1 capsule by mouth daily.    . Red Yeast Rice 600 MG TABS Take 1 tablet by mouth daily.       No current facility-administered medications for this visit.    Allergies:   Vicodin [hydrocodone-acetaminophen], Pletaal [cilostazol], and Statins    Social History:  The patient  reports that she quit smoking about 47 years ago. Her smoking use included cigarettes. She has a 13.50 pack-year smoking history. She has never used smokeless tobacco. She reports current alcohol use. She reports that she does not use drugs.   Family History:  The patient's family history includes Arthritis in her mother  and sister; COPD in her father and sister; Glaucoma in her father; Heart attack (age of onset: 65) in her father; Heart disease in her mother; Hypertension in her sister; Prostate cancer in her father.      PHYSICAL EXAM: VS:  BP 134/80   Pulse 82   Resp 15   Ht 5\' 2"  (1.575 m)   Wt 124 lb 12.8 oz (56.6 kg)   BMI 22.83 kg/m  , BMI Body mass index is 22.83 kg/m. GEN: Well nourished, well developed, in no acute distress  HEENT: normal  Neck: no JVD, carotid bruits, or masses Cardiac: Irregularly irregular; no murmurs, rubs, or gallops,no edema  Respiratory:  clear to auscultation bilaterally, normal  work of breathing GI: soft, nontender, nondistended, + BS MS: no deformity or atrophy  Skin: warm and dry, no rash Neuro:  Strength and sensation are intact Psych: euthymic mood, full affect Distal pulses are not palpable.   EKG:  EKG is ordered today. The ekg ordered today demonstrates atrial fibrillation with low voltage.  Ventricular rate is 82 bpm.  Recent Labs: 07/23/2019: Hemoglobin 13.9; Platelets 194 10/08/2019: ALT 19; BUN 12; Creatinine, Ser 0.65; Potassium 4.3; Sodium 139    Lipid Panel    Component Value Date/Time   CHOL 201 (H) 10/08/2019 1350   TRIG 107.0 10/08/2019 1350   HDL 57.00 10/08/2019 1350   CHOLHDL 4 10/08/2019 1350   VLDL 21.4 10/08/2019 1350   LDLCALC 122 (H) 10/08/2019 1350   LDLDIRECT 130.5 03/26/2013 0916      Wt Readings from Last 3 Encounters:  02/11/20 124 lb 12.8 oz (56.6 kg)  10/08/19 127 lb 12.8 oz (58 kg)  07/23/19 131 lb 12.8 oz (59.8 kg)        ASSESSMENT AND PLAN:  1.  Permanent atrial fibrillation: Chads Vasc score is 5.  She is tolerating anticoagulation with Eliquis.  She continues to be asymptomatic from atrial fibrillation and thus I recommend continuing rate control with Toprol.   2. PAD: Currently with minimal claudication.  Continue medical therapy.  2.  Hyperlipidemia: She is intolerant to statins and has been on red yeast Rice.  3. Essential Hypertension: I agree with switching losartan to amlodipine due to GI symptoms.  However, she reports some symptoms since the switch.  Consider GI consult for frequent bowel movements.   Disposition:   FU in 6 months.  Signed, [name], MD  02/11/2020 10:53 AM    [place]

## 2020-02-12 ENCOUNTER — Telehealth: Payer: Self-pay | Admitting: Family Medicine

## 2020-02-12 NOTE — Telephone Encounter (Signed)
PT called and wanted Dr. Etter Sjogren to know that since her meds for bp changed she's been going to make BM more often ( But Not Diarrhea) She wants to possibly change her meds again but not sure She didn't have her BP checked the recently at another doctor and was told it ws doing just fine. I was able to get patient to make an appointment for next Friday. But she still insisted on message being sent about her frequent bath room usage.

## 2020-02-13 NOTE — Telephone Encounter (Signed)
Please advise 

## 2020-02-14 ENCOUNTER — Ambulatory Visit: Payer: Medicare Other | Admitting: Family

## 2020-02-14 ENCOUNTER — Other Ambulatory Visit: Payer: Self-pay

## 2020-02-14 DIAGNOSIS — Z5329 Procedure and treatment not carried out because of patient's decision for other reasons: Secondary | ICD-10-CM

## 2020-02-14 DIAGNOSIS — Z91199 Patient's noncompliance with other medical treatment and regimen due to unspecified reason: Secondary | ICD-10-CM

## 2020-02-14 NOTE — Telephone Encounter (Signed)
Spoke with patient. Pt states having 5-6 bowel movements a day and having some stomach pain and urgency before having to go to the bathroom. Pt states soft stools but not diarrhea.

## 2020-02-14 NOTE — Telephone Encounter (Signed)
Since she is seeing cardiology I would want them to change it if it needs to be changed

## 2020-02-14 NOTE — Progress Notes (Signed)
  Attempted to reach patient on both numbers listed- no answer on either, unable to leave message.

## 2020-02-15 ENCOUNTER — Other Ambulatory Visit: Payer: Self-pay | Admitting: Family Medicine

## 2020-02-15 DIAGNOSIS — K529 Noninfective gastroenteritis and colitis, unspecified: Secondary | ICD-10-CM

## 2020-02-15 NOTE — Telephone Encounter (Signed)
The frequent stools did not improve with change of meds.Evelyn [name]      i'm not convinced it is the medication Refer to GI ------- also recommended by cardiology per their note

## 2020-02-17 ENCOUNTER — Encounter: Payer: Self-pay | Admitting: Physician Assistant

## 2020-02-18 ENCOUNTER — Telehealth: Payer: Self-pay | Admitting: Family Medicine

## 2020-02-18 NOTE — Telephone Encounter (Signed)
Patient states she would like to GI Dr in our building, please advise

## 2020-02-18 NOTE — Telephone Encounter (Signed)
Patient prefers a woman Provider, when see GI

## 2020-02-18 NOTE — Telephone Encounter (Signed)
Pt referred.  to GI  °

## 2020-02-18 NOTE — Telephone Encounter (Signed)
Pt states that blood pressure is normal for one week now.  BP 121/66  Pulse 71

## 2020-02-21 ENCOUNTER — Ambulatory Visit: Payer: Medicare Other | Admitting: Family Medicine

## 2020-02-24 ENCOUNTER — Other Ambulatory Visit: Payer: Self-pay | Admitting: Cardiovascular Disease

## 2020-02-24 NOTE — Telephone Encounter (Signed)
Refill Request.  

## 2020-02-24 NOTE — Telephone Encounter (Signed)
Pt's age 86, wt 56.6 kg, SCr 0.65, CrCl 53.46, last ov w/ MA 02/11/20. Pt is currently on Eliquis 5 mg BID Per protocol, "In AF pts, reduce dose to 2.5 mg PO BID if 2 or more of: age >80 yrs, body wt <60 kg or SCr >1.5. Since pt meets 2 of these criteria, will route to Dr. Fletcher Anon to see if is he agreeable to dosage change.

## 2020-02-25 NOTE — Telephone Encounter (Signed)
I agree with decreasing Eliquis to 2.5 mg bid. Thanks.

## 2020-02-25 NOTE — Telephone Encounter (Signed)
Called patient and made her aware of the change in dose.

## 2020-02-25 NOTE — Telephone Encounter (Signed)
Patient' weight was previously >60 kg, though she has lost some weight over the last year and is now less than 60 kg.  I would favor decreasing apixaban to 2.5 mg twice daily per dosing guidelines but will defer change to Dr. Fletcher Anon when he is back in the office.  Nelva Bush, MD [facility]

## 2020-02-26 ENCOUNTER — Ambulatory Visit: Payer: Medicare Other | Admitting: Physician Assistant

## 2020-03-12 ENCOUNTER — Encounter: Payer: Self-pay | Admitting: Physician Assistant

## 2020-03-12 ENCOUNTER — Ambulatory Visit: Payer: Medicare Other | Admitting: Physician Assistant

## 2020-03-12 ENCOUNTER — Other Ambulatory Visit (INDEPENDENT_AMBULATORY_CARE_PROVIDER_SITE_OTHER): Payer: Medicare Other

## 2020-03-12 VITALS — BP 126/68 | HR 67 | Temp 97.6°F | Ht 62.0 in | Wt 126.0 lb

## 2020-03-12 DIAGNOSIS — R634 Abnormal weight loss: Secondary | ICD-10-CM

## 2020-03-12 DIAGNOSIS — E119 Type 2 diabetes mellitus without complications: Secondary | ICD-10-CM

## 2020-03-12 DIAGNOSIS — R194 Change in bowel habit: Secondary | ICD-10-CM

## 2020-03-12 LAB — TSH: TSH: 3.12 u[IU]/mL (ref 0.35–4.50)

## 2020-03-12 LAB — CBC WITH DIFFERENTIAL/PLATELET
Basophils Absolute: 0 10*3/uL (ref 0.0–0.1)
Basophils Relative: 0.6 % (ref 0.0–3.0)
Eosinophils Absolute: 0.2 10*3/uL (ref 0.0–0.7)
Eosinophils Relative: 2.8 % (ref 0.0–5.0)
HCT: 42 % (ref 36.0–46.0)
Hemoglobin: 14.4 g/dL (ref 12.0–15.0)
Lymphocytes Relative: 34.6 % (ref 12.0–46.0)
Lymphs Abs: 2.6 10*3/uL (ref 0.7–4.0)
MCHC: 34.1 g/dL (ref 30.0–36.0)
MCV: 95.1 fl (ref 78.0–100.0)
Monocytes Absolute: 0.8 10*3/uL (ref 0.1–1.0)
Monocytes Relative: 10.7 % (ref 3.0–12.0)
Neutro Abs: 3.8 10*3/uL (ref 1.4–7.7)
Neutrophils Relative %: 51.3 % (ref 43.0–77.0)
Platelets: 190 10*3/uL (ref 150.0–400.0)
RBC: 4.42 Mil/uL (ref 3.87–5.11)
RDW: 13.7 % (ref 11.5–15.5)
WBC: 7.5 10*3/uL (ref 4.0–10.5)

## 2020-03-12 LAB — C-REACTIVE PROTEIN: CRP: <1 mg/dL (ref 0.5–20.0)

## 2020-03-12 LAB — SEDIMENTATION RATE: Sed Rate: 16 mm/hr (ref 0–30)

## 2020-03-12 NOTE — Progress Notes (Signed)
Subjective:    Patient ID: Evelyn [name], female    DOB: [birth year], 86 y.o.   MRN: [id number]  HPI Evelyn [name] is a pleasant 86 year old white female, new to GI today, referred by Dr. Carollee Herter for evaluation of frequent stools.  Patient has history of hypertension, peripheral artery disease, atrial fibrillation for which she is on Eliquis. She says that she had a colonoscopy probably 7 or 8 years ago in [place] but does not recall who did the procedure and no reports in epic.  She says she was told exam was negative and she did not have any polyps. Patient says her current symptoms have been present over the past 11 to 12 weeks and persistent.  She has been having at least 5-6 bowel movements per day, and usually has most of the stools in the morning.  She describes the bowel movements as soft loose, stringy, sometimes smaller amounts and sometimes larger volume.  She has not noted any melena or hematochezia.  She denies any abdominal pain or bloating or cramping.  Her weight is down about 6 or 7 pounds over the past 3 months.  She denies any nausea or vomiting.  She does have urgency at times with the stools. She had been on Pletal earlier this year for peripheral arterial disease and thought that that caused diarrhea so this was stopped.  She also mentioned a blood pressure medicine being changed, but no improvement in the diarrhea/loose stools. No recent labs or imaging.  Review of Systems Pertinent positive and negative review of systems were noted in the above HPI section.  All other review of systems was otherwise negative.  Outpatient Encounter Medications as of 03/12/2020  Medication Sig  . amLODipine (NORVASC) 5 MG tablet Take 1 tablet (5 mg total) by mouth daily.  Marland Kitchen apixaban (ELIQUIS) 2.5 MG TABS tablet Take 1 tablet (2.5 mg total) by mouth 2 (two) times daily.  . Ascorbic Acid (VITAMIN C PO) Take 1 tablet by mouth daily. Pt unsure of how many mgs.  . calcium carbonate (OS-CAL) 600 MG  TABS Take 600 mg by mouth daily.    . Cholecalciferol (VITAMIN D PO) Take 1 capsule by mouth daily. Pt unsure of mgs.  Marland Kitchen glucosamine-chondroitin 500-400 MG tablet Take 1 tablet by mouth daily.   . Latanoprostene Bunod (VYZULTA) 0.024 % SOLN Apply 1 drop to eye daily.  . metoprolol succinate (TOPROL-XL) 100 MG 24 hr tablet Take 1 tablet (100 mg total) by mouth daily. Take with or immediately following a meal.  . Omega-3 Fatty Acids (FISH OIL) 1000 MG CAPS Take 1 capsule by mouth daily.  . Red Yeast Rice 600 MG TABS Take 1 tablet by mouth daily.     No facility-administered encounter medications on file as of 03/12/2020.   Allergies  Allergen Reactions  . Vicodin [Hydrocodone-Acetaminophen] Other (See Comments)    Abdominal Pain  . Pletaal [Cilostazol] Diarrhea  . Statins    Patient Active Problem List   Diagnosis Date Noted  . Seasonal allergies 11/27/2019  . Atrial fibrillation ([place]) 03/26/2019  . Anticoagulated 03/26/2019  . Loose stools 04/14/2015  . Osteoarthritis 04/14/2015  . Chronic flank pain 06/17/2013  . Plantar fasciitis 05/25/2013  . PAD (peripheral artery disease) ([place]) 03/07/2013  . Pain in joint, lower leg 02/05/2013  . CARDIAC MURMUR 06/29/2010  . VARICOSE VEINS, LOWER EXTREMITIES 06/02/2010  . PLANTAR WART, LEFT 04/03/2009  . MOLE 04/03/2009  . HIP PAIN, RIGHT 04/03/2009  . Dyslipidemia, goal  LDL below 70 06/29/2007  . ARTIFICIAL MENOPAUSE 06/29/2007  . Pain in limb 06/29/2007  . BREAST BIOPSY, HX OF 06/29/2007  . GLAUCOMA, LEFT EYE 04/27/2007  . Essential hypertension 04/27/2007  . OSTEOPOROSIS 04/27/2007   Social History   Socioeconomic History  . Marital status: Widowed    Spouse name: Not on file  . Number of children: Not on file  . Years of education: Not on file  . Highest education level: Not on file  Occupational History  . Not on file  Tobacco Use  . Smoking status: Former Smoker    Packs/day: 0.75    Years: 18.00    Pack years: 13.50     Types: Cigarettes    Quit date: 02/20/1973    Years since quitting: 47.0  . Smokeless tobacco: Never Used  Substance and Sexual Activity  . Alcohol use: Yes  . Drug use: No  . Sexual activity: Yes    Partners: Male  Other Topics Concern  . Not on file  Social History Narrative   Exercise--no   Social Determinants of Health   Financial Resource Strain:   . Difficulty of Paying Living Expenses:   Food Insecurity:   . Worried About Charity fundraiser in the Last Year:   . Arboriculturist in the Last Year:   Transportation Needs:   . Film/video editor (Medical):   Marland Kitchen Lack of Transportation (Non-Medical):   Physical Activity:   . Days of Exercise per Week:   . Minutes of Exercise per Session:   Stress:   . Feeling of Stress :   Social Connections:   . Frequency of Communication with Friends and Family:   . Frequency of Social Gatherings with Friends and Family:   . Attends Religious Services:   . Active Member of Clubs or Organizations:   . Attends Archivist Meetings:   Marland Kitchen Marital Status:   Intimate Partner Violence:   . Fear of Current or Ex-Partner:   . Emotionally Abused:   Marland Kitchen Physically Abused:   . Sexually Abused:     Ms. Evelyn [name] family history includes Arthritis in her mother and sister; COPD in her father and sister; Glaucoma in her father; Heart attack (age of onset: 30) in her father; Heart disease in her mother; Hypertension in her sister; Prostate cancer in her father.      Objective:    Vitals:   03/12/20 1435  BP: 126/68  Pulse: 67  Temp: 97.6 F (36.4 C)    Physical Exam Well-developed well-nourished elderly white female, very pleasant in no acute distress.  Height, Weight, 126 BMI 23.0  HEENT; nontraumatic normocephalic, EOMI, PER R LA, sclera anicteric. Oropharynx; not examined Neck; supple, no JVD Cardiovascular; irregular rate and rhythm with S1-S2, no murmur rub or gallop Pulmonary; Clear bilaterally Abdomen; soft, nontender,  nondistended, no palpable mass or hepatosplenomegaly, bowel sounds are active Rectal; not done today patient declined Skin; benign exam, no jaundice rash or appreciable lesions Extremities; no clubbing cyanosis or edema skin warm and dry Neuro/Psych; alert and oriented x4, grossly nonfocal mood and affect appropriate       Assessment & Plan:   #75 86 year old white female with 12-week history of change in bowel habits with loose frequent stools with at least 5-6 bowel movements per day.  She has had an associated weight loss or 6 -7 pounds, no associated abdominal pain cramping nausea etc. No change in diarrhea with alteration of meds i.e. Pletal  Etiology not clear, rule out new onset colitis, microscopic colitis, doubt infectious but will rule out,  #2 peripheral arterial disease #3.  Atrial fibrillation  #4.  Chronic anticoagulation-Eliquis  Plan; we will check CBC with differential, c-Met, sed rate, CRP, TSH GI path panel, stool for lactoferrin, fecal elastase  Start Imodium 1 p.o. every morning  If labs and stool studies are unrevealing, she will need colonoscopy with Dr. Havery Moros.  She is agreeable to colonoscopy if needed. Eliquis would need to be held for 48 hours prior to procedure. Patient has had COVID-19 vaccination.  Patient will sign a release and will obtain records from her prior colonoscopy [facility] versus Dr. Adriana Mccallum)     [place] PA-C 03/12/2020   Cc: Ann Held, *

## 2020-03-12 NOTE — Patient Instructions (Signed)
If you are age 84 or older, your body mass index should be between 23-30. Your Body mass index is 23.05 kg/m. If this is out of the aforementioned range listed, please consider follow up with your Primary Care Provider.  If you are age 69 or younger, your body mass index should be between 19-25. Your Body mass index is 23.05 kg/m. If this is out of the aformentioned range listed, please consider follow up with your Primary Care Provider.   Your provider has requested that you go to the basement level for lab work before leaving today. Press "B" on the elevator. The lab is located at the first door on the left as you exit the elevator.  Due to recent changes in healthcare laws, you may see the results of your imaging and laboratory studies on MyChart before your provider has had a chance to review them.  We understand that in some cases there may be results that are confusing or concerning to you. Not all laboratory results come back in the same time frame and the provider may be waiting for multiple results in order to interpret others.  Please give [place] 48 hours in order for your provider to thoroughly review all the results before contacting the office for clarification of your results.   START taking Imodium. Take 1 tablet in the morning.

## 2020-03-13 ENCOUNTER — Other Ambulatory Visit: Payer: Medicare Other

## 2020-03-13 DIAGNOSIS — R194 Change in bowel habit: Secondary | ICD-10-CM

## 2020-03-13 DIAGNOSIS — R634 Abnormal weight loss: Secondary | ICD-10-CM

## 2020-03-13 DIAGNOSIS — E119 Type 2 diabetes mellitus without complications: Secondary | ICD-10-CM | POA: Diagnosis not present

## 2020-03-13 DIAGNOSIS — R195 Other fecal abnormalities: Secondary | ICD-10-CM | POA: Diagnosis not present

## 2020-03-13 NOTE — Progress Notes (Signed)
Agree with assessment and plan as outlined.  

## 2020-03-16 LAB — GI PROFILE, STOOL, PCR

## 2020-03-16 LAB — FECAL LACTOFERRIN, QUANT
Fecal Lactoferrin: NEGATIVE
MICRO NUMBER:: 10373126
SPECIMEN QUALITY:: ADEQUATE

## 2020-03-25 ENCOUNTER — Telehealth: Payer: Self-pay | Admitting: Family Medicine

## 2020-03-25 NOTE — Progress Notes (Signed)
  Chronic Care Management   Outreach Note  03/25/2020 Name: Evelyn [name] MRN: [id number] DOB: March 04, 1930  Referred by: Ann Held, DO Reason for referral : No chief complaint on file.   An unsuccessful telephone outreach was attempted today. The patient was referred to the pharmacist for assistance with care management and care coordination.    This note is not being shared with the patient for the following reason: To respect privacy (The patient or proxy has requested that the information not be shared).  Follow Up Plan:   Raynicia Dukes [employer] Scheduler

## 2020-03-30 ENCOUNTER — Telehealth: Payer: Self-pay | Admitting: Family Medicine

## 2020-03-30 NOTE — Progress Notes (Signed)
°  Chronic Care Management   Note  03/30/2020 Name: Evelyn [name] MRN: [id number] DOB: 1930-04-08  Evelyn [name] is a 86 y.o. year old female who is a primary care patient of Ann Held, DO. I reached out to Evelyn [name] by phone today in response to a referral sent by Evelyn [name]'s PCP, Ann Held, DO.   Evelyn [name] was given information about Chronic Care Management services today including:  1. CCM service includes personalized support from designated clinical staff supervised by her physician, including individualized plan of care and coordination with other care providers 2. 24/7 contact phone numbers for assistance for urgent and routine care needs. 3. Service will only be billed when office clinical staff spend 20 minutes or more in a month to coordinate care. 4. Only one practitioner may furnish and bill the service in a calendar month. 5. The patient may stop CCM services at any time (effective at the end of the month) by phone call to the office staff.   Patient agreed to services and verbal consent obtained.    This note is not being shared with the patient for the following reason: To respect privacy (The patient or proxy has requested that the information not be shared).  Follow up plan:  Evelyn [name] [employer] Scheduler

## 2020-04-07 ENCOUNTER — Other Ambulatory Visit: Payer: Self-pay | Admitting: *Deleted

## 2020-04-07 ENCOUNTER — Ambulatory Visit: Payer: Medicare Other | Admitting: Family Medicine

## 2020-04-07 MED ORDER — AMLODIPINE BESYLATE 5 MG PO TABS: 5.0000 mg | ORAL_TABLET | Freq: Every day | ORAL | 0 refills | Status: DC

## 2020-04-14 ENCOUNTER — Ambulatory Visit (INDEPENDENT_AMBULATORY_CARE_PROVIDER_SITE_OTHER): Payer: Medicare Other | Admitting: Family Medicine

## 2020-04-14 ENCOUNTER — Other Ambulatory Visit: Payer: Self-pay

## 2020-04-14 ENCOUNTER — Encounter: Payer: Self-pay | Admitting: Family Medicine

## 2020-04-14 VITALS — BP 110/68 | HR 74 | Temp 97.7°F | Resp 18 | Ht 62.0 in | Wt 121.6 lb

## 2020-04-14 DIAGNOSIS — R195 Other fecal abnormalities: Secondary | ICD-10-CM

## 2020-04-14 DIAGNOSIS — E785 Hyperlipidemia, unspecified: Secondary | ICD-10-CM | POA: Insufficient documentation

## 2020-04-14 DIAGNOSIS — I10 Essential (primary) hypertension: Secondary | ICD-10-CM | POA: Diagnosis not present

## 2020-04-14 MED ORDER — LOPERAMIDE HCL 2 MG PO TABS: ORAL_TABLET | ORAL | 0 refills | Status: DC

## 2020-04-14 MED ORDER — UP4 PROBIOTICS ADULT PO CAPS: ORAL_CAPSULE | ORAL | Status: DC

## 2020-04-14 NOTE — Progress Notes (Signed)
Patient ID: Evelyn [name], female    DOB: 1929-12-03  Age: 86 y.o. MRN: [id number]    Subjective:  Subjective  HPI Evelyn [name] presents for f/u bp.  She is seeing gi about loose stools  Review of Systems  Constitutional: Negative for appetite change, diaphoresis, fatigue and unexpected weight change.  Eyes: Negative for pain, redness and visual disturbance.  Respiratory: Negative for cough, chest tightness, shortness of breath and wheezing.   Cardiovascular: Negative for chest pain, palpitations and leg swelling.  Endocrine: Negative for cold intolerance, heat intolerance, polydipsia, polyphagia and polyuria.  Genitourinary: Negative for difficulty urinating, dysuria and frequency.  Neurological: Negative for dizziness, light-headedness, numbness and headaches.    History Past Medical History:  Diagnosis Date  . Atrial fibrillation ([place])   . Hyperlipidemia   . Hypertension   . Osteoporosis   . Post-menopausal    HRT    She has a past surgical history that includes Abdominal hysterectomy and Breast lumpectomy (1975).   Her family history includes Arthritis in her mother and sister; COPD in her father and sister; Glaucoma in her father; Heart attack (age of onset: 10) in her father; Heart disease in her mother; Hypertension in her sister; Prostate cancer in her father.She reports that she quit smoking about 47 years ago. Her smoking use included cigarettes. She has a 13.50 pack-year smoking history. She has never used smokeless tobacco. She reports current alcohol use. She reports that she does not use drugs.  Current Outpatient Medications on File Prior to Visit  Medication Sig Dispense Refill  . amLODipine (NORVASC) 5 MG tablet Take 1 tablet (5 mg total) by mouth daily. 90 tablet 0  . apixaban (ELIQUIS) 2.5 MG TABS tablet Take 1 tablet (2.5 mg total) by mouth 2 (two) times daily. 60 tablet 5  . Ascorbic Acid (VITAMIN C PO) Take 1 tablet by mouth daily. Pt unsure of how many mgs.     . calcium carbonate (OS-CAL) 600 MG TABS Take 600 mg by mouth daily.      . Cholecalciferol (VITAMIN D PO) Take 1 capsule by mouth daily. Pt unsure of mgs.    Marland Kitchen glucosamine-chondroitin 500-400 MG tablet Take 1 tablet by mouth daily.     . Latanoprostene Bunod (VYZULTA) 0.024 % SOLN Apply 1 drop to eye daily.    . metoprolol succinate (TOPROL-XL) 100 MG 24 hr tablet Take 1 tablet (100 mg total) by mouth daily. Take with or immediately following a meal. 90 tablet 3  . Omega-3 Fatty Acids (FISH OIL) 1000 MG CAPS Take 1 capsule by mouth daily.    . Red Yeast Rice 600 MG TABS Take 1 tablet by mouth daily.       No current facility-administered medications on file prior to visit.     Objective:  Objective  Physical Exam Vitals and nursing note reviewed.  Constitutional:      Appearance: She is well-developed.  HENT:     Head: Normocephalic and atraumatic.  Eyes:     Conjunctiva/sclera: Conjunctivae normal.  Neck:     Thyroid: No thyromegaly.     Vascular: No carotid bruit or JVD.  Cardiovascular:     Rate and Rhythm: Normal rate and regular rhythm.     Heart sounds: Normal heart sounds. No murmur.  Pulmonary:     Effort: Pulmonary effort is normal. No respiratory distress.     Breath sounds: Normal breath sounds. No wheezing or rales.  Chest:  Chest wall: No tenderness.  Musculoskeletal:     Cervical back: Normal range of motion and neck supple.  Neurological:     Mental Status: She is alert and oriented to person, place, and time.    BP 110/68 (BP Location: Left Arm, Patient Position: Sitting, Cuff Size: Normal)   Pulse 74   Temp 97.7 F (36.5 C) (Temporal)   Resp 18   Ht 5\' 2"  (1.575 m)   Wt 121 lb 9.6 oz (55.2 kg)   SpO2 95%   BMI 22.24 kg/m  Wt Readings from Last 3 Encounters:  04/14/20 121 lb 9.6 oz (55.2 kg)  03/12/20 126 lb (57.2 kg)  02/11/20 124 lb 12.8 oz (56.6 kg)     Lab Results  Component Value Date   WBC 7.5 03/12/2020   HGB 14.4 03/12/2020    HCT 42.0 03/12/2020   PLT 190.0 03/12/2020   GLUCOSE 96 10/08/2019   CHOL 201 (H) 10/08/2019   TRIG 107.0 10/08/2019   HDL 57.00 10/08/2019   LDLDIRECT 130.5 03/26/2013   LDLCALC 122 (H) 10/08/2019   ALT 19 10/08/2019   AST 22 10/08/2019   NA 139 10/08/2019   K 4.3 10/08/2019   CL 100 10/08/2019   CREATININE 0.65 10/08/2019   BUN 12 10/08/2019   CO2 31 10/08/2019   TSH 3.12 03/12/2020    DG Chest 2 View  Result Date: 05/07/2018 CLINICAL DATA:  Cough for 1 week.  Ex-smoker. EXAM: CHEST - 2 VIEW COMPARISON:  None. FINDINGS: Mild cardiomegaly.  Atherosclerotic changes at the aortic arch. Lungs are hyperexpanded. Coarse lung markings bilaterally indicate some degree of chronic interstitial lung disease. Chronic bronchitic changes noted centrally. No confluent opacity to suggest a developing pneumonia. No pleural effusion or pneumothorax seen. No acute or suspicious osseous finding. IMPRESSION: 1. No active cardiopulmonary disease. No evidence of pneumonia or pulmonary edema. 2. COPD with probable associated chronic interstitial lung disease and chronic bronchitic change. Electronically Signed   By: Franki Cabot M.D.   On: 05/07/2018 08:22     Assessment & Plan:  Plan  I am having Evelyn [name] start on loperamide and Up4 Probiotics Adult. I am also having her maintain her calcium carbonate, Red Yeast Rice, Cholecalciferol (VITAMIN D PO), Ascorbic Acid (VITAMIN C PO), glucosamine-chondroitin, Latanoprostene Bunod, Fish Oil, metoprolol succinate, apixaban, and amLODipine.  Meds ordered this encounter  Medications  . loperamide (IMODIUM A-D) 2 MG tablet    Sig: 1 po qd    Dispense:  30 tablet    Refill:  0  . Probiotic Product (UP4 PROBIOTICS ADULT) CAPS    Sig: 1 po qd    Dispense:       Problem List Items Addressed This Visit      Unprioritized   Essential hypertension - Primary    Well controlled, no changes to meds. Encouraged heart healthy diet such as the DASH diet and  exercise as tolerated.       Hyperlipidemia    Encouraged heart healthy diet, increase exercise, avoid trans fats, consider a krill oil cap daily      Loose stools    F/u GI         Follow-up: Return in about 6 months (around 10/15/2020), or if symptoms worsen or fail to improve, for fasting, annual exam.  Ann Held, DO

## 2020-04-14 NOTE — Patient Instructions (Signed)
DASH Eating Plan DASH stands for "Dietary Approaches to Stop Hypertension." The DASH eating plan is a healthy eating plan that has been shown to reduce high blood pressure (hypertension). It may also reduce your risk for type 2 diabetes, heart disease, and stroke. The DASH eating plan may also help with weight loss. What are tips for following this plan?  General guidelines  Avoid eating more than 2,300 mg (milligrams) of salt (sodium) a day. If you have hypertension, you may need to reduce your sodium intake to 1,500 mg a day.  Limit alcohol intake to no more than 1 drink a day for nonpregnant women and 2 drinks a day for men. One drink equals 12 oz of beer, 5 oz of wine, or 1 oz of hard liquor.  Work with your health care provider to maintain a healthy body weight or to lose weight. Ask what an ideal weight is for you.  Get at least 30 minutes of exercise that causes your heart to beat faster (aerobic exercise) most days of the week. Activities may include walking, swimming, or biking.  Work with your health care provider or diet and nutrition specialist (dietitian) to adjust your eating plan to your individual calorie needs. Reading food labels   Check food labels for the amount of sodium per serving. Choose foods with less than 5 percent of the Daily Value of sodium. Generally, foods with less than 300 mg of sodium per serving fit into this eating plan.  To find whole grains, look for the word "whole" as the first word in the ingredient list. Shopping  Buy products labeled as "low-sodium" or "no salt added."  Buy fresh foods. Avoid canned foods and premade or frozen meals. Cooking  Avoid adding salt when cooking. Use salt-free seasonings or herbs instead of table salt or sea salt. Check with your health care provider or pharmacist before using salt substitutes.  Do not fry foods. Cook foods using healthy methods such as baking, boiling, grilling, and broiling instead.  Cook with  heart-healthy oils, such as olive, canola, soybean, or sunflower oil. Meal planning  Eat a balanced diet that includes: ? 5 or more servings of fruits and vegetables each day. At each meal, try to fill half of your plate with fruits and vegetables. ? Up to 6-8 servings of whole grains each day. ? Less than 6 oz of lean meat, poultry, or fish each day. A 3-oz serving of meat is about the same size as a deck of cards. One egg equals 1 oz. ? 2 servings of low-fat dairy each day. ? A serving of nuts, seeds, or beans 5 times each week. ? Heart-healthy fats. Healthy fats called Omega-3 fatty acids are found in foods such as flaxseeds and coldwater fish, like sardines, salmon, and mackerel.  Limit how much you eat of the following: ? Canned or prepackaged foods. ? Food that is high in trans fat, such as fried foods. ? Food that is high in saturated fat, such as fatty meat. ? Sweets, desserts, sugary drinks, and other foods with added sugar. ? Full-fat dairy products.  Do not salt foods before eating.  Try to eat at least 2 vegetarian meals each week.  Eat more home-cooked food and less restaurant, buffet, and fast food.  When eating at a restaurant, ask that your food be prepared with less salt or no salt, if possible. What foods are recommended? The items listed may not be a complete list. Talk with your dietitian about   what dietary choices are best for you. Grains Whole-grain or whole-wheat bread. Whole-grain or whole-wheat pasta. Brown rice. Oatmeal. Quinoa. Bulgur. Whole-grain and low-sodium cereals. Pita bread. Low-fat, low-sodium crackers. Whole-wheat flour tortillas. Vegetables Fresh or frozen vegetables (raw, steamed, roasted, or grilled). Low-sodium or reduced-sodium tomato and vegetable juice. Low-sodium or reduced-sodium tomato sauce and tomato paste. Low-sodium or reduced-sodium canned vegetables. Fruits All fresh, dried, or frozen fruit. Canned fruit in natural juice (without  added sugar). Meat and other protein foods Skinless chicken or turkey. Ground chicken or turkey. Pork with fat trimmed off. Fish and seafood. Egg whites. Dried beans, peas, or lentils. Unsalted nuts, nut butters, and seeds. Unsalted canned beans. Lean cuts of beef with fat trimmed off. Low-sodium, lean deli meat. Dairy Low-fat (1%) or fat-free (skim) milk. Fat-free, low-fat, or reduced-fat cheeses. Nonfat, low-sodium ricotta or cottage cheese. Low-fat or nonfat yogurt. Low-fat, low-sodium cheese. Fats and oils Soft margarine without trans fats. Vegetable oil. Low-fat, reduced-fat, or light mayonnaise and salad dressings (reduced-sodium). Canola, safflower, olive, soybean, and sunflower oils. Avocado. Seasoning and other foods Herbs. Spices. Seasoning mixes without salt. Unsalted popcorn and pretzels. Fat-free sweets. What foods are not recommended? The items listed may not be a complete list. Talk with your dietitian about what dietary choices are best for you. Grains Baked goods made with fat, such as croissants, muffins, or some breads. Dry pasta or rice meal packs. Vegetables Creamed or fried vegetables. Vegetables in a cheese sauce. Regular canned vegetables (not low-sodium or reduced-sodium). Regular canned tomato sauce and paste (not low-sodium or reduced-sodium). Regular tomato and vegetable juice (not low-sodium or reduced-sodium). Pickles. Olives. Fruits Canned fruit in a light or heavy syrup. Fried fruit. Fruit in cream or butter sauce. Meat and other protein foods Fatty cuts of meat. Ribs. Fried meat. Bacon. Sausage. Bologna and other processed lunch meats. Salami. Fatback. Hotdogs. Bratwurst. Salted nuts and seeds. Canned beans with added salt. Canned or smoked fish. Whole eggs or egg yolks. Chicken or turkey with skin. Dairy Whole or 2% milk, cream, and half-and-half. Whole or full-fat cream cheese. Whole-fat or sweetened yogurt. Full-fat cheese. Nondairy creamers. Whipped toppings.  Processed cheese and cheese spreads. Fats and oils Butter. Stick margarine. Lard. Shortening. Ghee. Bacon fat. Tropical oils, such as coconut, palm kernel, or palm oil. Seasoning and other foods Salted popcorn and pretzels. Onion salt, garlic salt, seasoned salt, table salt, and sea salt. Worcestershire sauce. Tartar sauce. Barbecue sauce. Teriyaki sauce. Soy sauce, including reduced-sodium. Steak sauce. Canned and packaged gravies. Fish sauce. Oyster sauce. Cocktail sauce. Horseradish that you find on the shelf. Ketchup. Mustard. Meat flavorings and tenderizers. Bouillon cubes. Hot sauce and Tabasco sauce. Premade or packaged marinades. Premade or packaged taco seasonings. Relishes. Regular salad dressings. Where to find more information:  National Heart, Lung, and Blood Institute: www.nhlbi.nih.gov  American Heart Association: www.heart.org Summary  The DASH eating plan is a healthy eating plan that has been shown to reduce high blood pressure (hypertension). It may also reduce your risk for type 2 diabetes, heart disease, and stroke.  With the DASH eating plan, you should limit salt (sodium) intake to 2,300 mg a day. If you have hypertension, you may need to reduce your sodium intake to 1,500 mg a day.  When on the DASH eating plan, aim to eat more fresh fruits and vegetables, whole grains, lean proteins, low-fat dairy, and heart-healthy fats.  Work with your health care provider or diet and nutrition specialist (dietitian) to adjust your eating plan to your   individual calorie needs. This information is not intended to replace advice given to you by your health care provider. Make sure you discuss any questions you have with your health care provider. Document Revised: 10/27/2017 Document Reviewed: 11/07/2016 Elsevier Patient Education  2020 [employer] Inc.  

## 2020-04-17 NOTE — Assessment & Plan Note (Signed)
Encouraged heart healthy diet, increase exercise, avoid trans fats, consider a krill oil cap daily 

## 2020-04-17 NOTE — Assessment & Plan Note (Signed)
F/u GI 

## 2020-04-17 NOTE — Assessment & Plan Note (Signed)
Well controlled, no changes to meds. Encouraged heart healthy diet such as the DASH diet and exercise as tolerated.  °

## 2020-04-24 ENCOUNTER — Telehealth: Payer: Self-pay | Admitting: Physician Assistant

## 2020-04-24 NOTE — Telephone Encounter (Signed)
Pt stated if you call and don't get her at home number please call [phone].

## 2020-04-24 NOTE — Telephone Encounter (Signed)
Dr Havery Moros, please review and advise.  The patient is aware Amy is out of the office.  Spoke with the patient. She saw blood when cleaning herself. It was on the tissue. She has not seen blood since then. She continues to take daily Imodium. No further diarrhea. She has about 3 stools daily. No abdominal or rectal pain. No nausea. She asks if you feel she should have a colonoscopy. She understands to seek urgent medical care if she were to have heavy bleeding. Thanks

## 2020-04-28 NOTE — Telephone Encounter (Signed)
I was not in the office the afternoon this message was sent and just seeing it now.  Sounds like self limited small amount of bleeding resolved. I would not recommended colonoscopy right now and await visit on 6/24 if she is otherwise stable. Thanks

## 2020-04-28 NOTE — Telephone Encounter (Signed)
Called the patient. No answer. Left information on the voicemail and my name if she needs to call us back. She can ask for me.

## 2020-05-05 ENCOUNTER — Telehealth: Payer: Self-pay | Admitting: Family Medicine

## 2020-05-05 NOTE — Progress Notes (Signed)
  Chronic Care Management   Outreach Note  05/05/2020 Name: Evelyn [name] MRN: [id number] DOB: [birth year]  Referred by: Ann Held, DO Reason for referral : No chief complaint on file.   An unsuccessful telephone outreach was attempted today. The patient was referred to the pharmacist for assistance with care management and care coordination.   This note is not being shared with the patient for the following reason: To respect privacy (The patient or proxy has requested that the information not be shared).  Follow Up Plan:   Earney [place] [facility] Scheduler

## 2020-05-12 ENCOUNTER — Telehealth: Payer: Medicare Other

## 2020-05-13 DIAGNOSIS — H401122 Primary open-angle glaucoma, left eye, moderate stage: Secondary | ICD-10-CM | POA: Diagnosis not present

## 2020-05-13 DIAGNOSIS — H401111 Primary open-angle glaucoma, right eye, mild stage: Secondary | ICD-10-CM | POA: Diagnosis not present

## 2020-05-20 ENCOUNTER — Other Ambulatory Visit: Payer: Self-pay

## 2020-05-20 ENCOUNTER — Ambulatory Visit: Payer: Medicare Other | Admitting: Pharmacist

## 2020-05-20 DIAGNOSIS — E785 Hyperlipidemia, unspecified: Secondary | ICD-10-CM

## 2020-05-20 DIAGNOSIS — I10 Essential (primary) hypertension: Secondary | ICD-10-CM

## 2020-05-20 NOTE — Chronic Care Management (AMB) (Signed)
Chronic Care Management Pharmacy  Name: Evelyn [name]  MRN: [id number] DOB: 1930/05/01  Chief Complaint/ HPI  Evelyn [name],  86 y.o. , female presents for their Initial CCM visit with the clinical pharmacist via telephone due to COVID-19 Pandemic.  PCP : Ann Held, DO  Their chronic conditions include: AFib, Hypertension, Hyperlipidemia, Peripheral Artery Disease, Osteoporosis, Glaucoma, Chronic Diarrhea  Office Visits: 04/14/20: Visit w/ Dr. Etter Sjogren - Pt reporting loose stools. Meds added: loperamide, probiotic. No other med changes noted.   01/13/20: Pt called with concern for diarrhea since increase in losartan dose. Dr. Etter Sjogren changed losartan to amlodipine.   11/26/20: Visit w/ Dr. Etter Sjogren - No med changes noted.  Consult Visit: 04/24/20: Patient concern with blood possibly in stool. Wonders about colonoscopy vs regular appt. Since bleeding was self limited, advised to continue with regular appt on 05/21/20 with gastro.   03/12/20: Gastro appt w/ Amy Trellis Paganini, PA-C - New visit due to concern with loose stools. Pt feels cilostazol and changed bp med caused diarrhea. Stopped cilostazol Labs ordered (crp, cbc, esr, stool, wbc/lactoferrin, tsh, cmp, gi pathogen panel by pcr). Start immodium daily. If lab and stool studies unrevealing, will need colonoscopy w/ Dr. Havery Moros. Pt agreeable. Eliquis would need to be held 48 hrs prior to procedure.   02/11/20: Cardio visit w/ Dr. Fletcher Anon - Pt started cilostazol with improvement in claudication, but was not able to tolerate due to diarrhea. Pt intolerant to statins. Dx with Afib in 2020. Started on Xarelto, but had hematuria and switched to Elquis. Agree with change from losartan to amlodipine due to GI symptoms. Consider GI consult for frequent bowel movements. RTC 6 months  Medications: Outpatient Encounter Medications as of 05/20/2020  Medication Sig  . amLODipine (NORVASC) 5 MG tablet Take 1 tablet (5 mg total) by mouth daily.  Marland Kitchen  apixaban (ELIQUIS) 2.5 MG TABS tablet Take 1 tablet (2.5 mg total) by mouth 2 (two) times daily.  . calcium carbonate (OS-CAL) 600 MG TABS Take 600 mg by mouth daily.    . Cholecalciferol (VITAMIN D PO) Take 1 capsule by mouth daily. 2000 units  . glucosamine-chondroitin 500-400 MG tablet Take 1 tablet by mouth daily.   . Latanoprostene Bunod (VYZULTA) 0.024 % SOLN Apply 1 drop to eye daily.  Marland Kitchen loperamide (IMODIUM A-D) 2 MG tablet 1 po qd  . metoprolol succinate (TOPROL-XL) 100 MG 24 hr tablet Take 1 tablet (100 mg total) by mouth daily. Take with or immediately following a meal.  . Omega-3 Fatty Acids (FISH OIL) 1000 MG CAPS Take 1 capsule by mouth daily.  . Probiotic Product (UP4 PROBIOTICS ADULT) CAPS 1 po qd  . Red Yeast Rice 600 MG TABS Take 1 tablet by mouth daily.    . [DISCONTINUED] Ascorbic Acid (VITAMIN C PO) Take 1 tablet by mouth daily. 514m (Patient not taking: Reported on 05/21/2020)   No facility-administered encounter medications on file as of 05/20/2020.   SDOH Screenings   Alcohol Screen:   . Last Alcohol Screening Score (AUDIT):   Depression (PHQ2-9): Low Risk   . PHQ-2 Score: 0  Financial Resource Strain:   . Difficulty of Paying Living Expenses:   Food Insecurity:   . Worried About RCharity fundraiserin the Last Year:   . RUnionin the Last Year:   Housing:   . Last Housing Risk Score:   Physical Activity:   . Days of Exercise per Week:   .  Minutes of Exercise per Session:   Social Connections:   . Frequency of Communication with Friends and Family:   . Frequency of Social Gatherings with Friends and Family:   . Attends Religious Services:   . Active Member of Clubs or Organizations:   . Attends Archivist Meetings:   Marland Kitchen Marital Status:   Stress:   . Feeling of Stress :   Tobacco Use: Medium Risk  . Smoking Tobacco Use: Former Smoker  . Smokeless Tobacco Use: Never Used  Transportation Needs:   . Film/video editor (Medical):   Marland Kitchen  Lack of Transportation (Non-Medical):      Current Diagnosis/Assessment:  Goals Addressed            This Visit's Progress   . Chronic Care Management Pharmacy Care Plan       CARE PLAN ENTRY (see longitudinal plan of care for additional care plan information)  Current Barriers:  . Chronic Disease Management support, education, and care coordination needs related to AFib, Hypertension, Hyperlipidemia, Peripheral Artery Disease, Osteoporosis, Glaucoma, Chronic Diarrhea   Hypertension BP Readings from Last 3 Encounters:  04/14/20 110/68  03/12/20 126/68  02/11/20 134/80   . Pharmacist Clinical Goal(s): o Over the next 90 days, patient will work with PharmD and providers to maintain BP goal <140/90 . Current regimen:   Amlodipine 37m daily  Metoprolol succinate 1047mdaily . Patient self care activities - Over the next 90 days, patient will: o Maintain hypertension medication regimen  Hyperlipidemia Lab Results  Component Value Date/Time   LDLCALC 122 (H) 10/08/2019 01:50 PM   LDLDIRECT 130.5 03/26/2013 09:16 AM   . Pharmacist Clinical Goal(s): o Over the next 90 days, patient will work with PharmD and providers to achieve LDL goal < 70 . Current regimen:   Fish Oil 100023maily  Red yeast rice 600m78mily . Interventions: o Discussed importance of LDL goal.  . Patient self care activities - Over the next 90 days, patient will: o Read cholesterol book given by your daughter (reduce cholesterol intake) o Maintain cholesterol medication regimen.    Osteoporosis . Pharmacist Clinical Goal(s) o Over the next 90 days, patient will work with PharmD and providers to reduce risk of fracture due to osteoporosis . Current regimen:   Calcium 600mg77mly  Vitamin D 2000 units daily . Interventions: o Recommend patient to get daily intake of 1200mg 78mium through diet and/or supplementation o Consider repeat DEXA (noting patient may not want to begin treatment for  osteoporosis) . Patient self care activities - Over the next 90 days, patient will: o Get daily intake of 1200mg c22mum through diet and/or supplementation  Medication management . Pharmacist Clinical Goal(s): o Over the next 90 days, patient will work with PharmD and providers to maintain optimal medication adherence . Current pharmacy: [facility] . Interventions o Comprehensive medication review performed. o Continue current medication management strategy . Patient self care activities - Over the next 90 days, patient will: o Focus on medication adherence by filling and taking medications appropriately  o Take medications as prescribed o Report any questions or concerns to PharmD and/or provider(s)  Initial goal documentation       Social Hx:  Widowed since 1999.  Has 3 children. Son in [place] grandchildren. 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  She is one of  four children, but all siblings are deceased.  Mother was a Marine scientist for 25 years. She worked 3rd shift and that was hard on the family.  Diet She admits to eating a lot of fried foods and sweets.  Meals vary. She doesn't have the same meal each Karly Pitter. Cereal for breakfast is the most consistent. B - Cereal and whatever fruit she has around plus 2 cups of coffee "I enjoy my mugs of coffee" L - Sometimes goes out to eat for lunch. Today she is having leftover fish.  D - Tonight she is having a ground round burger Snacks - Rarely Drinks - Only drinks coffee during the morning, about 2-3 days has a beer, sometimes has a mixed drink She wants to increase her water intake  Exercise Doesn't walk as much because she [facility]  She rides a [employer] occasionally. She wants to try to do it daily  Med Management  Uses a pill box to organize her  medications weekly on Sunday morning.  Admits that she has forgotten to place apixaban in her pill box for the morning dose.   AFIB   Patient is currently rate controlled.  Patient has failed these meds in past: Xarelto (hematuria) Patient is currently controlled on the following medications: Apixaban 2.22m twice daily, metoprolol succinate 1030mdaily  Denies feeling any arrythmias  We discussed:  Pathophys of AFib  Plan -Continue current medications   Hypertension   CMP Latest Ref Rng & Units 10/08/2019 07/23/2019 09/04/2018  Glucose 70 - 99 mg/dL 96 81 94  BUN 6 - 23 mg/dL '12 13 14  ' Creatinine 0.40 - 1.20 mg/dL 0.65 0.80 0.72  Sodium 135 - 145 mEq/L 139 141 138  Potassium 3.5 - 5.1 mEq/L 4.3 4.9 4.4  Chloride 96 - 112 mEq/L 100 100 101  CO2 19 - 32 mEq/L '31 26 29  ' Calcium 8.4 - 10.5 mg/dL 9.6 10.0 9.8  Total Protein 6.0 - 8.3 g/dL 7.8 - 8.1  Total Bilirubin 0.2 - 1.2 mg/dL 0.9 - 0.7  Alkaline Phos 39 - 117 U/L 62 - 66  AST 0 - 37 U/L 22 - 19  ALT 0 - 35 U/L 19 - 22   Kidney Function Lab Results  Component Value Date/Time   CREATININE 0.65 10/08/2019 01:50 PM   CREATININE 0.80 07/23/2019 12:24 PM   CREATININE 0.86 05/04/2018 03:49 PM   GFR 86.07 10/08/2019 01:50 PM   GFRNONAA 67 07/23/2019 12:24 PM   GFRAA 77 07/23/2019 12:24 PM   BP today is: 128/88 pulse 73 while on phone    Office blood pressures are  BP Readings from Last 3 Encounters:  05/21/20 124/72  04/14/20 110/68  03/12/20 126/68   Blood Pressure Goal <140/90  Patient has failed these meds in the past: losartan (possible cause of dirrhea) Patient is currently controlled on the following medications:   Amlodipine 66m39maily  Metoprolol succinate 100m26mily  Patient checks BP at home infrequently  Patient home BP readings are ranging: Unable to assess  Denies any chest pain or headaches  We discussed Blood pressure goal  Plan -Continue current medications   Hyperlipidemia   Lipid  Panel     Component Value Date/Time   CHOL 201 (H) 10/08/2019 1350   TRIG 107.0 10/08/2019 1350   HDL 57.00 10/08/2019 1350   LDLCALC 122 (H) 10/08/2019 1350   LDLDIRECT 130.5 03/26/2013 0916    LDL goal <70  The  ASCVD Risk score Mikey Bussing DC Jr., et al., 2013) failed to calculate for the following reasons:   The 2013 ASCVD risk score is only valid for ages 70 to 75   Patient has failed these meds in past: Statins ("it made me crazy") Patient is currently uncontrolled on the following medications:   Fish Oil 1071m daily  Red yeast rice 6097mdaily  Denies that she had muscle aches from the medication. She states her daughter has a book on cholesterol and she plans to read this to become more conscientious about her cholesterol intake  We discussed:  LDL goals and what can cause increase in cholesterol. Discussed medication options noting pt's age and current pill burden.   Plan -Read cholesterol book that daughter gave patient -Continue current medications   PAD    Patient has failed these meds in past: cilostazol (diarrhea) Patient is currently controlled on the following medications: None   Reports cramps in both of her legs at night every once in a while.  Reports she probably does not drink enough water.  Recommended patient to drink approximately half her body weight in ounces of water daily (~60oz/Faryal Marxen)  Plan -Continue current medications   Osteoporosis   Last DEXA Scan: 09/04/2013 (no statistically significant change from 2012; discontinued fosamax one year prior)  T-Score femoral neck: Left -2.7  Vit D, 25-Hydroxy  Date Value Ref Range Status  06/29/2010 61 30 - 89 ng/mL Final    Comment:    See lab report for associated comment(s)     Patient is a candidate for pharmacologic treatment due to T-Score < -2.5 in femoral neck (is currently in fosamax drug holiday?)  Patient has failed these meds in past: Prolia (teeth problems) Patient is currently stable on  the following medications:   Calcium 60064maily  Vitamin D 2000 units daily  Eats milk with her cereal daily. Eats yogurt occasionally. Doesn't have a regular schedule.  Does not want to take any further treatment for osteoporosis  We discussed:  Recommend 1200 mg of calcium daily from dietary and supplemental sources.  Plan -Consider repeat DEXA (noting pt most likely will decline treatment) -Continue current medications   Glaucoma/Dry Eyes    Patient has failed these meds in past: None noted  Patient is currently controlled on the following medications: Vyzulta 0.024% 1 drop daily PM, Blink, Timolol AM  She places her Vyzulta by her bed to help her remember to use the eyedrops after she takes out her contact lenses.   Plan -Continue current medications   Chronic Diarrhea    Patient has failed these meds in past: None noted  Patient is currently stable on the following medications: Probiotic, Loperamide 2mg59mily  She is nervous about her GI visit tomorrow.  She feels like her BM are not as firm as they should be. She states she has been dealing with this for about 5 months. Even with taking immodium daily she feels like her bowels are not normal. She feels the urge to go more in the morning. She feels like her diet hasn't changed in the last 5 months.  Admits she may not have enough fiber in her diet.  "I eat my share of sweets" She admits she eats dessert after lunch and dinner most dinner.  She eats cookies, lemon ice box meringue pie, dark chocolate  She states she also loves greens.   Plan -Continue current medications   Tick Bite   Found a tick on her skin  a few weeks ago. She believes it had been on her at least 24 hours. She thought it was a mole, but realized it was a tick.  She had a tick bite last year and felt it was on her for days. It was located at her pants line.   She is hoping she does not have Lyme's disease States she doesn't eat a lot of  meat, but plans to eat meat today  She doesn't feel the tick bite is related to her diarrhea  Vaccines   Reviewed and discussed patient's vaccination history.    Immunization History  Administered Date(s) Administered  . Fluad Quad(high Dose 65+) 10/08/2019  . Influenza Split 09/15/2011, 09/20/2012  . Influenza Whole 11/28/2000, 10/10/2007, 08/20/2008, 09/24/2009, 08/25/2010  . Influenza, High Dose Seasonal PF 10/01/2015, 09/29/2016, 09/04/2018  . Influenza,inj,Quad PF,6+ Mos 08/15/2013, 10/07/2014  . PFIZER SARS-COV-2 Vaccination 01/09/2020, 02/16/2020  . Pneumococcal Conjugate-13 10/07/2014  . Pneumococcal Polysaccharide-23 11/28/2000  . Td 11/28/2000  . Tdap 08/05/2011  . Zoster 12/28/2007  . Zoster Recombinat (Shingrix) 04/23/2019, 06/22/2019    Plan Patient is up to date on all vaccines   Miscellaneous Meds  Vitamin C (has slowed down on this d/t be  Glucosamine-chondroitin (feels these is helpful for her bones)

## 2020-05-21 ENCOUNTER — Encounter: Payer: Self-pay | Admitting: Physician Assistant

## 2020-05-21 ENCOUNTER — Other Ambulatory Visit (INDEPENDENT_AMBULATORY_CARE_PROVIDER_SITE_OTHER): Payer: Medicare Other

## 2020-05-21 ENCOUNTER — Ambulatory Visit: Payer: Medicare Other | Admitting: Physician Assistant

## 2020-05-21 VITALS — BP 124/72 | HR 73 | Ht 62.0 in | Wt 124.6 lb

## 2020-05-21 DIAGNOSIS — R194 Change in bowel habit: Secondary | ICD-10-CM

## 2020-05-21 LAB — CBC WITH DIFFERENTIAL/PLATELET
Basophils Absolute: 0.1 10*3/uL (ref 0.0–0.1)
Basophils Relative: 0.9 % (ref 0.0–3.0)
Eosinophils Absolute: 0.2 10*3/uL (ref 0.0–0.7)
Eosinophils Relative: 2.2 % (ref 0.0–5.0)
HCT: 40.9 % (ref 36.0–46.0)
Hemoglobin: 13.8 g/dL (ref 12.0–15.0)
Lymphocytes Relative: 35.6 % (ref 12.0–46.0)
Lymphs Abs: 2.4 10*3/uL (ref 0.7–4.0)
MCHC: 33.7 g/dL (ref 30.0–36.0)
MCV: 94.6 fl (ref 78.0–100.0)
Monocytes Absolute: 0.9 10*3/uL (ref 0.1–1.0)
Monocytes Relative: 12.8 % — ABNORMAL HIGH (ref 3.0–12.0)
Neutro Abs: 3.3 10*3/uL (ref 1.4–7.7)
Neutrophils Relative %: 48.5 % (ref 43.0–77.0)
Platelets: 188 10*3/uL (ref 150.0–400.0)
RBC: 4.32 Mil/uL (ref 3.87–5.11)
RDW: 13.5 % (ref 11.5–15.5)
WBC: 6.9 10*3/uL (ref 4.0–10.5)

## 2020-05-21 NOTE — Patient Instructions (Signed)
If you are age 84 or older, your body mass index should be between 23-30. Your Body mass index is 22.79 kg/m. If this is out of the aforementioned range listed, please consider follow up with your Primary Care Provider.  If you are age 37 or younger, your body mass index should be between 19-25. Your Body mass index is 22.79 kg/m. If this is out of the aformentioned range listed, please consider follow up with your Primary Care Provider.   Your provider has requested that you go to the basement level for lab work before leaving today. Press "B" on the elevator. The lab is located at the first door on the left as you exit the elevator.  Due to recent changes in healthcare laws, you may see the results of your imaging and laboratory studies on MyChart before your provider has had a chance to review them.  We understand that in some cases there may be results that are confusing or concerning to you. Not all laboratory results come back in the same time frame and the provider may be waiting for multiple results in order to interpret others.  Please give [place] 48 hours in order for your provider to thoroughly review all the results before contacting the office for clarification of your results.   Take imodium once a day   Start Benefiber daily. One scoop a day in 8 oz of water or juice.   Follow a high fiber diet.  Call in 2-3 weeks if no improvement.  It was a pleasure to see you today!  Amy Esterwood, PA-C

## 2020-05-21 NOTE — Progress Notes (Signed)
Agree with assessment and plan as outlined.  

## 2020-05-21 NOTE — Progress Notes (Signed)
Subjective:    Patient ID: Evelyn [name], female    DOB: [birth year], 86 y.o.   MRN: [id number]  HPI Evelyn [name] is a pleasant 86 year old white female, established with Dr. Havery [name], who was last seen in the office by myself on 03/12/2020, and comes in today for follow-up.  She has history of hypertension, atrial fibrillation for which she is maintained on Eliquis, osteoporosis and adult onset diabetes mellitus.  When she was seen in April she had complained of a several week history, of changes in bowel habits with loose more frequent stools, having at least 5-6 bowel movements per day.  She had had an associated weight loss of about 6 pounds.  She was not having any problems with abdominal pain cramping nausea etc.  CBC, and sed rate were normal as was TSH.  GI pathogen panel was done which was positive for norovirus, stool for lactoferrin negative and fecal elastase negative. She was started on 1 Imodium p.o. every morning with improvement in symptoms. She last had colonoscopy she believes 8 or 9 years ago elsewhere in [place] though cannot remember the provider's name. She says currently she is continuing to take the Imodium 1 every morning, she did try to [name] off of the Imodium but thought she had some mild increase in frequency of stools. She still having 3-4 bowel movements usually each morning but is not having diarrhea.  She says her stools are not back to normal or not as bulky as they used to be, sometimes they seem to be smaller looser or stronger.  She has not had any melena or hematochezia.  She did have 1 day of noticing some bright red blood on the tissue which she attributes to hemorrhoid and that has not recurred.  She is not experiencing any urgency at this point which she had been having previously.  Appetite has been very good weight overall stable. She is not wanting to proceed with colonoscopy if at all avoidable. She says she does not think she eats enough fiber and wonders if a  fiber supplement will help.  She says as she lives alone she generally does not do much cooking and thinks that this probably has something to do with her bowels currently.  Review of Systems Pertinent positive and negative review of systems were noted in the above HPI section.  All other review of systems was otherwise negative.  Outpatient Encounter Medications as of 05/21/2020  Medication Sig  . amLODipine (NORVASC) 5 MG tablet Take 1 tablet (5 mg total) by mouth daily.  Marland Kitchen apixaban (ELIQUIS) 2.5 MG TABS tablet Take 1 tablet (2.5 mg total) by mouth 2 (two) times daily.  . calcium carbonate (OS-CAL) 600 MG TABS Take 600 mg by mouth daily.    . Cholecalciferol (VITAMIN D PO) Take 1 capsule by mouth daily. 2000 units  . glucosamine-chondroitin 500-400 MG tablet Take 1 tablet by mouth daily.   . Latanoprostene Bunod (VYZULTA) 0.024 % SOLN Apply 1 drop to eye daily.  Marland Kitchen loperamide (IMODIUM A-D) 2 MG tablet 1 po qd  . metoprolol succinate (TOPROL-XL) 100 MG 24 hr tablet Take 1 tablet (100 mg total) by mouth daily. Take with or immediately following a meal.  . Omega-3 Fatty Acids (FISH OIL) 1000 MG CAPS Take 1 capsule by mouth daily.  . Probiotic Product (UP4 PROBIOTICS ADULT) CAPS 1 po qd  . Red Yeast Rice 600 MG TABS Take 1 tablet by mouth daily.    . [DISCONTINUED] Ascorbic  Acid (VITAMIN C PO) Take 1 tablet by mouth daily. 500mg  (Patient not taking: Reported on 05/21/2020)   No facility-administered encounter medications on file as of 05/21/2020.   Allergies  Allergen Reactions  . Vicodin [Hydrocodone-Acetaminophen] Other (See Comments)    Abdominal Pain  . Pletaal [Cilostazol] Diarrhea  . Statins    Patient Active Problem List   Diagnosis Date Noted  . Hyperlipidemia 04/14/2020  . Seasonal allergies 11/27/2019  . Atrial fibrillation ([place]) 03/26/2019  . Anticoagulated 03/26/2019  . Loose stools 04/14/2015  . Osteoarthritis 04/14/2015  . Chronic flank pain 06/17/2013  . Plantar  fasciitis 05/25/2013  . PAD (peripheral artery disease) ([place]) 03/07/2013  . Pain in joint, lower leg 02/05/2013  . CARDIAC MURMUR 06/29/2010  . VARICOSE VEINS, LOWER EXTREMITIES 06/02/2010  . PLANTAR WART, LEFT 04/03/2009  . MOLE 04/03/2009  . HIP PAIN, RIGHT 04/03/2009  . Dyslipidemia, goal LDL below 70 06/29/2007  . ARTIFICIAL MENOPAUSE 06/29/2007  . Pain in limb 06/29/2007  . BREAST BIOPSY, HX OF 06/29/2007  . GLAUCOMA, LEFT EYE 04/27/2007  . Essential hypertension 04/27/2007  . OSTEOPOROSIS 04/27/2007   Social History   Socioeconomic History  . Marital status: Widowed    Spouse name: Not on file  . Number of children: Not on file  . Years of education: Not on file  . Highest education level: Not on file  Occupational History  . Not on file  Tobacco Use  . Smoking status: Former Smoker    Packs/day: 0.75    Years: 18.00    Pack years: 13.50    Types: Cigarettes    Quit date: 02/20/1973    Years since quitting: 47.2  . Smokeless tobacco: Never Used  Substance and Sexual Activity  . Alcohol use: Yes  . Drug use: No  . Sexual activity: Yes    Partners: Male  Other Topics Concern  . Not on file  Social History Narrative   Exercise--no   Social Determinants of Health   Financial Resource Strain:   . Difficulty of Paying Living Expenses:   Food Insecurity:   . Worried About Charity fundraiser in the Last Year:   . Arboriculturist in the Last Year:   Transportation Needs:   . Film/video editor (Medical):   Marland Kitchen Lack of Transportation (Non-Medical):   Physical Activity:   . Days of Exercise per Week:   . Minutes of Exercise per Session:   Stress:   . Feeling of Stress :   Social Connections:   . Frequency of Communication with Friends and Family:   . Frequency of Social Gatherings with Friends and Family:   . Attends Religious Services:   . Active Member of Clubs or Organizations:   . Attends Archivist Meetings:   Marland Kitchen Marital Status:   Intimate  Partner Violence:   . Fear of Current or Ex-Partner:   . Emotionally Abused:   Marland Kitchen Physically Abused:   . Sexually Abused:     Ms. Evelyn [name] family history includes Arthritis in her mother and sister; COPD in her father and sister; Glaucoma in her father; Heart attack (age of onset: 60) in her father; Heart disease in her mother; Hypertension in her sister; Prostate cancer in her father.      Objective:    Vitals:   05/21/20 1438  BP: 124/72  Pulse: 73    Physical Exam Well-developed well-nourished elderly WF in no acute distress.  Height, ZHYQMV,784 BMI 22.7  HEENT; nontraumatic  normocephalic, EOMI, PER R LA, sclera anicteric. Oropharynx;not done Neck; supple, no JVD Cardiovascular; regular rate and rhythm with S1-S2, no murmur rub or gallop Pulmonary; Clear bilaterally Abdomen; soft, nontender, nondistended, no palpable mass or hepatosplenomegaly, bowel sounds are active Rectal;not done today  Skin; benign exam, no jaundice rash or appreciable lesions Extremities; no clubbing cyanosis or edema skin warm and dry Neuro/Psych; alert and oriented x4, grossly nonfocal mood and affect appropriate       Assessment & Plan:   #7 86 year old white female with several month history of change in bowel habits.  When she was initially seen she was having 5-6 bowel movements per day with some urgency and stools were very loose.  GI path panel was positive for norovirus. Over the past 2 months with addition of Imodium 1 p.o. every morning she has had improvement in her bowel movements, now having 3-4 bowel movements most mornings, but denies any diarrhea.  She feels the stools are a bit softer or stringy or sometimes just not bulky, there is no associated urgency, no melena or hematochezia, no abdominal cramping or discomfort and appetite has been good.  She may have had an infectious gastroenteritis i.e. positive for norovirus at outset of symptoms.  She may have some postinfectious IBS which  is mild.,  Cannot rule out an underlying microscopic colitis.  #2 hypertension 3.  Atrial fibrillation 4.  Chronic anticoagulation-on Eliquis 5.  Adult onset diabetes mellitus  Plan; CBC today Continue Imodium 1 p.o. every morning Add trial of Benefiber 1 scoop daily in a glass of water We discussed starting a higher fiber diet instituting a serving or 2 of vegetables/fruits per day.  She was given a copy of a high-fiber diet. I have asked her to call back in a couple of weeks with an update.  Evelyn [name] S Promise Weldin PA-C 05/21/2020   Cc: Ann Held, *

## 2020-05-23 NOTE — Patient Instructions (Addendum)
Visit Information  Goals Addressed            This Visit's Progress   . Chronic Care Management Pharmacy Care Plan       CARE PLAN ENTRY (see longitudinal plan of care for additional care plan information)  Current Barriers:  . Chronic Disease Management support, education, and care coordination needs related to AFib, Hypertension, Hyperlipidemia, Peripheral Artery Disease, Osteoporosis, Glaucoma, Chronic Diarrhea   Hypertension BP Readings from Last 3 Encounters:  04/14/20 110/68  03/12/20 126/68  02/11/20 134/80   . Pharmacist Clinical Goal(s): o Over the next 90 days, patient will work with PharmD and providers to maintain BP goal <140/90 . Current regimen:   Amlodipine 5mg  daily  Metoprolol succinate 100mg  daily . Patient self care activities - Over the next 90 days, patient will: o Maintain hypertension medication regimen  Hyperlipidemia Lab Results  Component Value Date/Time   LDLCALC 122 (H) 10/08/2019 01:50 PM   LDLDIRECT 130.5 03/26/2013 09:16 AM   . Pharmacist Clinical Goal(s): o Over the next 90 days, patient will work with PharmD and providers to achieve LDL goal < 70 . Current regimen:   Fish Oil 1000mg  daily  Red yeast rice 600mg  daily . Interventions: o Discussed importance of LDL goal.  . Patient self care activities - Over the next 90 days, patient will: o Read cholesterol book given by your daughter (reduce cholesterol intake) o Maintain cholesterol medication regimen.    Osteoporosis . Pharmacist Clinical Goal(s) o Over the next 90 days, patient will work with PharmD and providers to reduce risk of fracture due to osteoporosis . Current regimen:   Calcium 600mg  daily  Vitamin D 2000 units daily . Interventions: o Recommend patient to get daily intake of 1200mg  calcium through diet and/or supplementation o Consider repeat DEXA (noting patient may not want to begin treatment for osteoporosis) . Patient self care activities - Over the next  90 days, patient will: o Get daily intake of 1200mg  calcium through diet and/or supplementation  Medication management . Pharmacist Clinical Goal(s): o Over the next 90 days, patient will work with PharmD and providers to maintain optimal medication adherence . Current pharmacy: [facility] . Interventions o Comprehensive medication review performed. o Continue current medication management strategy . Patient self care activities - Over the next 90 days, patient will: o Focus on medication adherence by filling and taking medications appropriately  o Take medications as prescribed o Report any questions or concerns to PharmD and/or provider(s)  Initial goal documentation        Evelyn [name] was given information about Chronic Care Management services today including:  1. CCM service includes personalized support from designated clinical staff supervised by her physician, including individualized plan of care and coordination with other care providers 2. 24/7 contact phone numbers for assistance for urgent and routine care needs. 3. Standard insurance, coinsurance, copays and deductibles apply for chronic care management only during months in which we provide at least 20 minutes of these services. Most insurances cover these services at 100%, however patients may be responsible for any copay, coinsurance and/or deductible if applicable. This service may help you avoid the need for more expensive face-to-face services. 4. Only one practitioner may furnish and bill the service in Evelyn calendar month. 5. The patient may stop CCM services at any time (effective at the end of the month) by phone call to the office staff.  Patient agreed to services and verbal consent obtained.   The patient verbalized  understanding of instructions provided today and agreed to receive Evelyn mailed copy of patient instruction and/or educational materials. Telephone follow up appointment with pharmacy team member scheduled  for: 06/17/2020  Melvenia Beam Evelyn [name], PharmD Clinical Pharmacist [facility] [phone]    Cholesterol Content in Foods Cholesterol is Evelyn waxy, fat-like substance that helps to carry fat in the blood. The body needs cholesterol in small amounts, but too much cholesterol can cause damage to the arteries and heart. Most people should eat less than 200 milligrams (mg) of cholesterol Evelyn Evelyn [name]. Foods with cholesterol  Cholesterol is found in animal-based foods, such as meat, seafood, and dairy. Generally, low-fat dairy and lean meats have less cholesterol than full-fat dairy and fatty meats. The milligrams of cholesterol per serving (mg per serving) of common cholesterol-containing foods are listed below. Meat and other proteins  Egg -- one large whole egg has 186 mg.  Veal shank -- 4 oz has 141 mg.  Lean ground [place] (93% lean) -- 4 oz has 118 mg.  Fat-trimmed lamb loin -- 4 oz has 106 mg.  Lean ground beef (90% lean) -- 4 oz has 100 mg.  Lobster -- 3.5 oz has 90 mg.  Pork loin chops -- 4 oz has 86 mg.  Canned salmon -- 3.5 oz has 83 mg.  Fat-trimmed beef top loin -- 4 oz has 78 mg.  Frankfurter -- 1 frank (3.5 oz) has 77 mg.  Crab -- 3.5 oz has 71 mg.  Roasted chicken without skin, white meat -- 4 oz has 66 mg.  Light bologna -- 2 oz has 45 mg.  Deli-cut [place] -- 2 oz has 31 mg.  Canned tuna -- 3.5 oz has 31 mg.  Evelyn [name] -- 1 oz has 29 mg.  Oysters and mussels (raw) -- 3.5 oz has 25 mg.  Mackerel -- 1 oz has 22 mg.  Trout -- 1 oz has 20 mg.  Pork sausage -- 1 link (1 oz) has 17 mg.  Salmon -- 1 oz has 16 mg.  Tilapia -- 1 oz has 14 mg. Dairy  Soft-serve ice cream --  cup (4 oz) has 103 mg.  Whole-milk yogurt -- 1 cup (8 oz) has 29 mg.  Cheddar cheese -- 1 oz has 28 mg.  American cheese -- 1 oz has 28 mg.  Whole milk -- 1 cup (8 oz) has 23 mg.  2% milk -- 1 cup (8 oz) has 18 mg.  Cream cheese -- 1 tablespoon (Tbsp) has 15  mg.  Cottage cheese --  cup (4 oz) has 14 mg.  Low-fat (1%) milk -- 1 cup (8 oz) has 10 mg.  Sour cream -- 1 Tbsp has 8.5 mg.  Low-fat yogurt -- 1 cup (8 oz) has 8 mg.  Nonfat Greek yogurt -- 1 cup (8 oz) has 7 mg.  Half-and-half cream -- 1 Tbsp has 5 mg. Fats and oils  Cod liver oil -- 1 tablespoon (Tbsp) has 82 mg.  Butter -- 1 Tbsp has 15 mg.  Lard -- 1 Tbsp has 14 mg.  Bacon grease -- 1 Tbsp has 14 mg.  Mayonnaise -- 1 Tbsp has 5-10 mg.  Margarine -- 1 Tbsp has 3-10 mg. Exact amounts of cholesterol in these foods may vary depending on specific ingredients and brands. Foods without cholesterol Most plant-based foods do not have cholesterol unless you combine them with Evelyn food that has cholesterol. Foods without cholesterol include:  Grains and cereals.  Vegetables.  Fruits.  Vegetable oils, such as olive, canola, and sunflower oil.  Legumes, such as peas, beans, and lentils.  Nuts and seeds.  Egg whites. Summary  The body needs cholesterol in small amounts, but too much cholesterol can cause damage to the arteries and heart.  Most people should eat less than 200 milligrams (mg) of cholesterol Evelyn [name]. This information is not intended to replace advice given to you by your health care provider. Make sure you discuss any questions you have with your health care provider. Document Revised: 10/27/2017 Document Reviewed: 07/11/2017 Elsevier Patient Education  [place].

## 2020-06-17 ENCOUNTER — Ambulatory Visit: Payer: Medicare Other | Admitting: Pharmacist

## 2020-06-17 ENCOUNTER — Other Ambulatory Visit: Payer: Self-pay

## 2020-06-17 DIAGNOSIS — E785 Hyperlipidemia, unspecified: Secondary | ICD-10-CM

## 2020-06-17 NOTE — Chronic Care Management (AMB) (Signed)
Chronic Care Management Pharmacy  Name: Evelyn [name]  MRN: [id number] DOB: 1930-11-13  Chief Complaint/ HPI  Evelyn [name],  86 y.o. , female presents for their Follow-Up CCM visit with the clinical pharmacist via telephone due to COVID-19 Pandemic.  PCP : Ann Held, DO  Their chronic conditions include: AFib, Hypertension, Hyperlipidemia, Peripheral Artery Disease, Osteoporosis, Glaucoma, Chronic Diarrhea  Office Visits: None since last CCM visit on 05/20/20.   Consult Visit: 05/21/20: Gertie Fey appt w/ Amy Trellis Paganini, PA-C - Continue immodium daily. Add trial of benefiber 1 scoop daily in water. Increase fiber in diet.   Medications: Outpatient Encounter Medications as of 06/17/2020  Medication Sig  . amLODipine (NORVASC) 5 MG tablet Take 1 tablet (5 mg total) by mouth daily.  Marland Kitchen apixaban (ELIQUIS) 2.5 MG TABS tablet Take 1 tablet (2.5 mg total) by mouth 2 (two) times daily.  . calcium carbonate (OS-CAL) 600 MG TABS Take 600 mg by mouth daily.    . Cholecalciferol (VITAMIN D PO) Take 1 capsule by mouth daily. 2000 units  . glucosamine-chondroitin 500-400 MG tablet Take 1 tablet by mouth daily.   . Latanoprostene Bunod (VYZULTA) 0.024 % SOLN Apply 1 drop to eye daily.  Marland Kitchen loperamide (IMODIUM A-D) 2 MG tablet 1 po qd  . metoprolol succinate (TOPROL-XL) 100 MG 24 hr tablet Take 1 tablet (100 mg total) by mouth daily. Take with or immediately following a meal.  . Omega-3 Fatty Acids (FISH OIL) 1000 MG CAPS Take 1 capsule by mouth daily.  . Probiotic Product (UP4 PROBIOTICS ADULT) CAPS 1 po qd  . Red Yeast Rice 600 MG TABS Take 1 tablet by mouth daily.     No facility-administered encounter medications on file as of 06/17/2020.   SDOH Screenings   Alcohol Screen:   . Last Alcohol Screening Score (AUDIT):   Depression (PHQ2-9): Low Risk   . PHQ-2 Score: 0  Financial Resource Strain:   . Difficulty of Paying Living Expenses:   Food Insecurity:   . Worried About Ship broker in the Last Year:   . [place] in the Last Year:   Housing:   . Last Housing Risk Score:   Physical Activity:   . Days of Exercise per Week:   . Minutes of Exercise per Session:   Social Connections:   . Frequency of Communication with Friends and Family:   . Frequency of Social Gatherings with Friends and Family:   . Attends Religious Services:   . Active Member of Clubs or Organizations:   . Attends Archivist Meetings:   Marland Kitchen Marital Status:   Stress:   . Feeling of Stress :   Tobacco Use: Medium Risk  . Smoking Tobacco Use: Former Smoker  . Smokeless Tobacco Use: Never Used  Transportation Needs:   . Film/video editor (Medical):   Marland Kitchen Lack of Transportation (Non-Medical):      Current Diagnosis/Assessment:  Goals Addressed            This Visit's Progress   . Chronic Care Management Pharmacy Care Plan       CARE PLAN ENTRY (see longitudinal plan of care for additional care plan information)  Current Barriers:  . Chronic Disease Management support, education, and care coordination needs related to AFib, Hypertension, Hyperlipidemia, Peripheral Artery Disease, Osteoporosis, Glaucoma, Chronic Diarrhea   Hypertension BP Readings from Last 3 Encounters:  04/14/20 110/68  03/12/20 126/68  02/11/20 134/80   .  Pharmacist Clinical Goal(s): o Over the next 90 days, patient will work with PharmD and providers to maintain BP goal <140/90 . Current regimen:   Amlodipine 5mg  daily  Metoprolol succinate 100mg  daily . Patient self care activities - Over the next 90 days, patient will: o Maintain hypertension medication regimen  Hyperlipidemia Lab Results  Component Value Date/Time   LDLCALC 122 (H) 10/08/2019 01:50 PM   LDLDIRECT 130.5 03/26/2013 09:16 AM   . Pharmacist Clinical Goal(s): o Over the next 90 days, patient will work with PharmD and providers to achieve LDL goal < 70 . Current regimen:   Fish Oil 1000mg  daily  Red yeast rice  600mg  daily . Interventions: o Discussed importance of LDL goal.  . Patient self care activities - Over the next 90 days, patient will: o Read cholesterol book given by your daughter (reduce cholesterol intake) o Maintain cholesterol medication regimen.   Loose Stools . Pharmacist Clinical Goal(s) o Over the next 90 days, patient will work with PharmD and providers to reduce symptoms of loose stools . Current regimen:  o Loperamide 2mg  daily o Probiotic daily o Fiber . Patient self care activities - Over the next 90 days, patient will: o Maintain bowel medication regimen.  Osteoporosis . Pharmacist Clinical Goal(s) o Over the next 90 days, patient will work with PharmD and providers to reduce risk of fracture due to osteoporosis . Current regimen:   Calcium 600mg  daily  Vitamin D 2000 units daily . Interventions: o Recommend patient to get daily intake of 1200mg  calcium through diet and/or supplementation o Consider repeat DEXA (noting patient may not want to begin treatment for osteoporosis) . Patient self care activities - Over the next 90 days, patient will: o Get daily intake of 1200mg  calcium through diet and/or supplementation  Medication management . Pharmacist Clinical Goal(s): o Over the next 90 days, patient will work with PharmD and providers to maintain optimal medication adherence . Current pharmacy: [facility] . Interventions o Comprehensive medication review performed. o Continue current medication management strategy . Patient self care activities - Over the next 90 days, patient will: o Focus on medication adherence by filling and taking medications appropriately  o Take medications as prescribed o Report any questions or concerns to PharmD and/or provider(s)  Please see past updates related to this goal by clicking on the "Past Updates" button in the selected goal        Social Hx:  Widowed since 1999.  Has 3 children. Son in [place]. Two  daughters live in [place].  Has 3 grandchildren. One in [place] and two at [place]. 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  She is one of four children, but all siblings are deceased.  Mother was a Marine scientist for 25 years. She worked 3rd shift and that was hard on the family.  Diet She admits to eating a lot of fried foods and sweets.  Meals vary. She doesn't have the same meal each Lataisha Colan. Cereal for breakfast is the most consistent. B - Cereal and whatever fruit she has around plus 2 cups of coffee "I enjoy my mugs of coffee" L - Sometimes goes out to eat for lunch. Today she is having leftover fish.  D - Tonight she is having a ground round burger Snacks - Rarely Drinks - Only drinks coffee during the morning, about 2-3 days has a beer, sometimes has a mixed drink She wants to increase her water  intake 06/17/20 - Hasn't measured her intake of water, but she is trying to drink "fiber water"  Exercise Doesn't walk as much because [facility]  She rides a [employer] occasionally. She wants to try to do it daily. She is also lifting weights.   Med Management  Uses a pill box to organize her medications weekly on Sunday morning.  Admits that she has forgotten to place apixaban in her pill box for the morning dose.     Hyperlipidemia   Lipid Panel     Component Value Date/Time   CHOL 201 (H) 10/08/2019 1350   TRIG 107.0 10/08/2019 1350   HDL 57.00 10/08/2019 1350   LDLCALC 122 (H) 10/08/2019 1350   LDLDIRECT 130.5 03/26/2013 0916    LDL goal <70  The ASCVD Risk score ([place]., et al., 2013) failed to calculate for the following reasons:   The 2013 ASCVD risk score is only valid for ages 47 to 32   Patient has failed these meds in past: Statins ("it made me crazy") Patient is currently uncontrolled on the following medications:   Fish Oil 1000mg  daily  Red yeast rice  600mg  daily  Denies that she had muscle aches from the medication. She states her daughter has a book on cholesterol and she plans to read this to become more conscientious about her cholesterol intake  We discussed:  LDL goals and what can cause increase in cholesterol. Discussed medication options noting pt's age and current pill burden.   Update 06/17/20 Read her book about cholesterol and appreciated the information. "Take Charge of Cholesterol: How to lower the bad and raise the good" by Earle Gell. She has decided to give up cream in her coffee.  She is planning to do skim or fat free milk vs whole milk.  She is more conscientious about her diet.  "Hero" = HDL "Lethal" = LDL   Plan -Continue current medications    Chronic Diarrhea    Patient has failed these meds in past: None noted  Patient is currently stable on the following medications: Probiotic, Loperamide 2mg  daily  She is nervous about her GI visit tomorrow.  She feels like her BM are not as firm as they should be. She states she has been dealing with this for about 5 months. Even with taking immodium daily she feels like her bowels are not normal. She feels the urge to go more in the morning. She feels like her diet hasn't changed in the last 5 months.  Admits she may not have enough fiber in her diet.  "I eat my share of sweets" She admits she eats dessert after lunch and dinner most dinner.  She eats cookies, lemon ice box meringue pie, dark chocolate  She states she also loves greens.   Update 06/17/20 Drinking "fiber water", taking immodium.  No pain, and diarrhea is better, but not completely resolved.   More soft and more frequent    Plan -Continue current medications    Miscellaneous Meds  Vitamin C (has slowed down on this d/t be  Glucosamine-chondroitin (feels these is helpful for her bones)

## 2020-06-20 NOTE — Patient Instructions (Addendum)
Visit Information  Goals Addressed            This Visit's Progress   . Chronic Care Management Pharmacy Care Plan       CARE PLAN ENTRY (see longitudinal plan of care for additional care plan information)  Current Barriers:  . Chronic Disease Management support, education, and care coordination needs related to AFib, Hypertension, Hyperlipidemia, Peripheral Artery Disease, Osteoporosis, Glaucoma, Chronic Diarrhea   Hypertension BP Readings from Last 3 Encounters:  04/14/20 110/68  03/12/20 126/68  02/11/20 134/80   . Pharmacist Clinical Goal(s): o Over the next 90 days, patient will work with PharmD and providers to maintain BP goal <140/90 . Current regimen:   Amlodipine 5mg  daily  Metoprolol succinate 100mg  daily . Patient self care activities - Over the next 90 days, patient will: o Maintain hypertension medication regimen  Hyperlipidemia Lab Results  Component Value Date/Time   LDLCALC 122 (H) 10/08/2019 01:50 PM   LDLDIRECT 130.5 03/26/2013 09:16 AM   . Pharmacist Clinical Goal(s): o Over the next 90 days, patient will work with PharmD and providers to achieve LDL goal < 70 . Current regimen:   Fish Oil 1000mg  daily  Red yeast rice 600mg  daily . Interventions: o Discussed importance of LDL goal.  . Patient self care activities - Over the next 90 days, patient will: o Read cholesterol book given by your daughter (reduce cholesterol intake) o Maintain cholesterol medication regimen.   Loose Stools . Pharmacist Clinical Goal(s) o Over the next 90 days, patient will work with PharmD and providers to reduce symptoms of loose stools . Current regimen:  o Loperamide 2mg  daily o Probiotic daily o Fiber . Patient self care activities - Over the next 90 days, patient will: o Maintain bowel medication regimen.  Osteoporosis . Pharmacist Clinical Goal(s) o Over the next 90 days, patient will work with PharmD and providers to reduce risk of fracture due to  osteoporosis . Current regimen:   Calcium 600mg  daily  Vitamin D 2000 units daily . Interventions: o Recommend patient to get daily intake of 1200mg  calcium through diet and/or supplementation o Consider repeat DEXA (noting patient may not want to begin treatment for osteoporosis) . Patient self care activities - Over the next 90 days, patient will: o Get daily intake of 1200mg  calcium through diet and/or supplementation  Medication management . Pharmacist Clinical Goal(s): o Over the next 90 days, patient will work with PharmD and providers to maintain optimal medication adherence . Current pharmacy: [facility] . Interventions o Comprehensive medication review performed. o Continue current medication management strategy . Patient self care activities - Over the next 90 days, patient will: o Focus on medication adherence by filling and taking medications appropriately  o Take medications as prescribed o Report any questions or concerns to PharmD and/or provider(s)  Please see past updates related to this goal by clicking on the "Past Updates" button in the selected goal         The patient verbalized understanding of instructions provided today and agreed to receive a mailed copy of patient instruction and/or educational materials.  Telephone follow up appointment with pharmacy team member scheduled for: 09/15/20  Evelyn [name], PharmD Clinical Pharmacist [facility] [phone]

## 2020-06-23 ENCOUNTER — Telehealth: Payer: Self-pay | Admitting: Family Medicine

## 2020-06-23 NOTE — Progress Notes (Signed)
  Chronic Care Management   Note  06/23/2020 Name: [name] MRN: [id number] DOB: 11-05-1930  [name] [name] is a 86 y.o. year old female who is a primary care patient of Ann Held, DO. I reached out to [name] by phone today in response to a referral sent by [name]'s PCP, Ann Held, DO.   [name] was given information about Chronic Care Management services today including:  1. CCM service includes personalized support from designated clinical staff supervised by her physician, including individualized plan of care and coordination with other care providers 2. 24/7 contact phone numbers for assistance for urgent and routine care needs. 3. Service will only be billed when office clinical staff spend 20 minutes or more in a month to coordinate care. 4. Only one practitioner may furnish and bill the service in a calendar month. 5. The patient may stop CCM services at any time (effective at the end of the month) by phone call to the office staff.   Patient agreed to services and verbal consent obtained.   Follow up plan:   Carley Perdue [employer] Scheduler

## 2020-07-18 ENCOUNTER — Other Ambulatory Visit: Payer: Self-pay | Admitting: Family Medicine

## 2020-08-18 ENCOUNTER — Ambulatory Visit: Payer: Medicare Other | Admitting: Cardiovascular Disease

## 2020-08-18 ENCOUNTER — Other Ambulatory Visit: Payer: Self-pay

## 2020-08-18 ENCOUNTER — Encounter: Payer: Self-pay | Admitting: Cardiovascular Disease

## 2020-08-18 VITALS — BP 122/76 | HR 57 | Ht 62.0 in | Wt 122.6 lb

## 2020-08-18 DIAGNOSIS — I739 Peripheral vascular disease, unspecified: Secondary | ICD-10-CM

## 2020-08-18 DIAGNOSIS — E785 Hyperlipidemia, unspecified: Secondary | ICD-10-CM | POA: Diagnosis not present

## 2020-08-18 DIAGNOSIS — I4821 Permanent atrial fibrillation: Secondary | ICD-10-CM | POA: Diagnosis not present

## 2020-08-18 DIAGNOSIS — I10 Essential (primary) hypertension: Secondary | ICD-10-CM | POA: Diagnosis not present

## 2020-08-18 MED ORDER — APIXABAN 2.5 MG PO TABS: 2.5000 mg | ORAL_TABLET | Freq: Two times a day (BID) | ORAL | 3 refills | Status: DC

## 2020-08-18 MED ORDER — METOPROLOL SUCCINATE ER 100 MG PO TB24: 100.0000 mg | ORAL_TABLET | Freq: Every day | ORAL | 3 refills | Status: DC

## 2020-08-18 NOTE — Progress Notes (Signed)
Cardiology Office Note   Date:  08/18/2020   ID:  Evelyn [name], DOB 1930-02-22, MRN [id number]  PCP:  Carollee Herter, Alferd Apa, DO  Cardiologist:   [name], MD   No chief complaint on file.     History of Present Illness: Evelyn [name] is a 86 y.o. female who presents for a followup visit regarding peripheral arterial disease and atrial fibrillation.   She has chronic medical conditions that include essential hypertension, hyperlipidemia and previous tobacco use.  She was seen in 2014 for atypical bilateral calf claudication. ABI was slightly reduced bilaterally with evidence of focal stenosis in the left SFA and diffuse nonobstructive disease in the right SFA. She was started with Pletal with some improvement in claudication. However, she did not tolerate the medication due to diarrhea. Claudication was not lifestyle limiting and thus she was treated medically.  She is intolerant to statins.  She was diagnosed with atrial fibrillation in 2020.  She was started on Xarelto for anticoagulation.  She had an echocardiogram which showed normal LV systolic function, moderately dilated left atrium and no significant valvular abnormalities.  She had hematuria on Xarelto and was switched to Eliquis with subsequent improvement.   She has been doing well with no recent chest pain, shortness of breath or palpitations.  No side effects with anticoagulation.  She complains of varicose veins in her legs.  No change in leg symptoms.   Past Medical History:  Diagnosis Date   Atrial fibrillation ([place])    Hyperlipidemia    Hypertension    Osteoporosis    Post-menopausal    HRT    Past Surgical History:  Procedure Laterality Date   ABDOMINAL HYSTERECTOMY     BREAST LUMPECTOMY  1975     Current Outpatient Medications  Medication Sig Dispense Refill   amLODipine (NORVASC) 5 MG tablet TAKE 1 TABLET(5 MG) BY MOUTH DAILY 90 tablet 0   apixaban (ELIQUIS) 2.5 MG TABS tablet Take 1  tablet (2.5 mg total) by mouth 2 (two) times daily. 180 tablet 3   calcium carbonate (OS-CAL) 600 MG TABS Take 600 mg by mouth daily.       Cholecalciferol (VITAMIN D PO) Take 1 capsule by mouth daily. 2000 units     Coenzyme Q10 (COQ10) 100 MG CAPS Take by mouth.     glucosamine-chondroitin 500-400 MG tablet Take 1 tablet by mouth daily.      Latanoprostene Bunod (VYZULTA) 0.024 % SOLN Apply 1 drop to eye daily.     loperamide (IMODIUM A-D) 2 MG tablet 1 po qd 30 tablet 0   metoprolol succinate (TOPROL-XL) 100 MG 24 hr tablet Take 1 tablet (100 mg total) by mouth daily. Take with or immediately following a meal. 90 tablet 3   Omega-3 Fatty Acids (FISH OIL) 1000 MG CAPS Take 1 capsule by mouth daily.     Probiotic Product (UP4 PROBIOTICS ADULT) CAPS 1 po qd     Red Yeast Rice 600 MG TABS Take 1 tablet by mouth daily.       No current facility-administered medications for this visit.    Allergies:   Vicodin [hydrocodone-acetaminophen], Pletaal [cilostazol], and Statins    Social History:  The patient  reports that she quit smoking about 47 years ago. Her smoking use included cigarettes. She has a 13.50 pack-year smoking history. She has never used smokeless tobacco. She reports current alcohol use. She reports that she does not use drugs.   Family  History:  The patient's family history includes Arthritis in her mother and sister; COPD in her father and sister; Glaucoma in her father; Heart attack (age of onset: 52) in her father; Heart disease in her mother; Hypertension in her sister; Prostate cancer in her father.      PHYSICAL EXAM: VS:  BP 122/76    Pulse (!) 57    Ht 5\' 2"  (1.575 m)    Wt 122 lb 9.6 oz (55.6 kg)    SpO2 97%    BMI 22.42 kg/m  , BMI Body mass index is 22.42 kg/m. GEN: Well nourished, well developed, in no acute distress  HEENT: normal  Neck: no JVD, carotid bruits, or masses Cardiac: Irregularly irregular; no murmurs, rubs, or gallops,no edema    Respiratory:  clear to auscultation bilaterally, normal work of breathing GI: soft, nontender, nondistended, + BS MS: no deformity or atrophy  Skin: warm and dry, no rash Neuro:  Strength and sensation are intact Psych: euthymic mood, full affect Distal pulses are not palpable.   EKG:  EKG is not ordered today.   Recent Labs: 10/08/2019: ALT 19; BUN 12; Creatinine, Ser 0.65; Potassium 4.3; Sodium 139 03/12/2020: TSH 3.12 05/21/2020: Hemoglobin 13.8; Platelets 188.0    Lipid Panel    Component Value Date/Time   CHOL 201 (H) 10/08/2019 1350   TRIG 107.0 10/08/2019 1350   HDL 57.00 10/08/2019 1350   CHOLHDL 4 10/08/2019 1350   VLDL 21.4 10/08/2019 1350   LDLCALC 122 (H) 10/08/2019 1350   LDLDIRECT 130.5 03/26/2013 0916      Wt Readings from Last 3 Encounters:  08/18/20 122 lb 9.6 oz (55.6 kg)  05/21/20 124 lb 9.6 oz (56.5 kg)  04/14/20 121 lb 9.6 oz (55.2 kg)        ASSESSMENT AND PLAN:  1.  Permanent atrial fibrillation: Chads Vasc score is 5.  She is tolerating anticoagulation with Eliquis.  Rate is well controlled with Toprol 100 mg once daily.  I reviewed most recent labs which showed normal CBC and basic metabolic profile.  2. PAD: Currently with minimal claudication.  Continue medical therapy.  2.  Hyperlipidemia: She is intolerant to statins and has been on red yeast Rice.  Most recent lipid profile showed an LDL of 122.  3. Essential Hypertension: Blood pressure is well controlled on current medications.   Disposition:   FU in 12 months.  Signed, [name], MD  08/18/2020 1:08 PM    [place]

## 2020-08-18 NOTE — Patient Instructions (Signed)

## 2020-08-20 ENCOUNTER — Telehealth: Payer: Self-pay | Admitting: Pharmacist

## 2020-08-21 NOTE — Progress Notes (Addendum)
Chronic Care Management Pharmacy Assistant   Name: Lilliah Priego  MRN: [id number] DOB: May 21, 1930  Reason for Encounter: Disease State  Patient Questions:  1.  Have you seen any other providers since your last visit? Yes  2.  Any changes in your medicines or health? No    PCP : Ann Held, DO   Their chronic conditions include: AFib, Hypertension, Hyperlipidemia, Peripheral Artery Disease, Osteoporosis, Glaucoma, Chronic Diarrhea  Consults: 08-18-20(Cardiology)  Patient presented in office with [name] for a followup visit regarding peripheral arterial disease and atrial fibrillation.  No medication changes.  No Office Visits or Hospitalizations since last CCM visits on 06-17-20.   Allergies:   Allergies  Allergen Reactions   Vicodin [Hydrocodone-Acetaminophen] Other (See Comments)    Abdominal Pain   Pletaal [Cilostazol] Diarrhea   Statins     Medications: Outpatient Encounter Medications as of 08/20/2020  Medication Sig   amLODipine (NORVASC) 5 MG tablet TAKE 1 TABLET(5 MG) BY MOUTH DAILY   apixaban (ELIQUIS) 2.5 MG TABS tablet Take 1 tablet (2.5 mg total) by mouth 2 (two) times daily.   calcium carbonate (OS-CAL) 600 MG TABS Take 600 mg by mouth daily.     Cholecalciferol (VITAMIN D PO) Take 1 capsule by mouth daily. 2000 units   Coenzyme Q10 (COQ10) 100 MG CAPS Take by mouth.   glucosamine-chondroitin 500-400 MG tablet Take 1 tablet by mouth daily.    Latanoprostene Bunod (VYZULTA) 0.024 % SOLN Apply 1 drop to eye daily.   loperamide (IMODIUM A-D) 2 MG tablet 1 po qd   metoprolol succinate (TOPROL-XL) 100 MG 24 hr tablet Take 1 tablet (100 mg total) by mouth daily. Take with or immediately following a meal.   Omega-3 Fatty Acids (FISH OIL) 1000 MG CAPS Take 1 capsule by mouth daily.   Probiotic Product (UP4 PROBIOTICS ADULT) CAPS 1 po qd   Red Yeast Rice 600 MG TABS Take 1 tablet by mouth daily.     No facility-administered encounter  medications on file as of 08/20/2020.    Current Diagnosis: Patient Active Problem List   Diagnosis Date Noted   Hyperlipidemia 04/14/2020   Seasonal allergies 11/27/2019   Atrial fibrillation ([place]) 03/26/2019   Anticoagulated 03/26/2019   Loose stools 04/14/2015   Osteoarthritis 04/14/2015   Chronic flank pain 06/17/2013   Plantar fasciitis 05/25/2013   PAD (peripheral artery disease) ([place]) 03/07/2013   Pain in joint, lower leg 02/05/2013   CARDIAC MURMUR 06/29/2010   VARICOSE VEINS, LOWER EXTREMITIES 06/02/2010   PLANTAR WART, LEFT 04/03/2009   MOLE 04/03/2009   HIP PAIN, RIGHT 04/03/2009   Dyslipidemia, goal LDL below 70 06/29/2007   ARTIFICIAL MENOPAUSE 06/29/2007   Pain in limb 06/29/2007   BREAST BIOPSY, HX OF 06/29/2007   GLAUCOMA, LEFT EYE 04/27/2007   Essential hypertension 04/27/2007   OSTEOPOROSIS 04/27/2007    Goals Addressed   None    08/21/2020 Name: Roza Creamer MRN: [id number] DOB: Jul 11, 1930 [name] MACKINSEY PELLAND is a 86 y.o. year old female who is a primary care patient of Ann Held, DO.  Comprehensive medication review performed; Spoke to patient regarding cholesterol  Lipid Panel    Component Value Date/Time   CHOL 201 (H) 10/08/2019 1350   TRIG 107.0 10/08/2019 1350   HDL 57.00 10/08/2019 1350   LDLCALC 122 (H) 10/08/2019 1350   LDLDIRECT 130.5 03/26/2013 0916    10-year ASCVD risk score: The ASCVD Risk score Mikey Bussing DC  Jr., et al., 2013) failed to calculate for the following reasons:   The 2013 ASCVD risk score is only valid for ages 84 to 57   Current antihyperlipidemic regimen:   Fish Oil 1000mg  daily  Red yeast rice 600mg  daily  Previous antihyperlipidemic medications tried: Statins  ASCVD risk enhancing conditions: age >84, DM, HTN, CKD, CHF, current smoker  What recent interventions/DTPs have been made by any provider to improve Cholesterol control since last CPP Visit: None  Any recent hospitalizations or  ED visits since last visit with CPP? No  What diet changes have been made to improve Cholesterol?  o Patient states she tries to eat a healthy balance meals.  States she does eat more yogurt and drinking a fiber water.  What exercise is being done to improve Cholesterol?  o Patient states she does lift weights, does yard work and cleans her house.    Adherence Review:  Does the patient have >5 day gap between last estimated fill dates? Yes     Follow-Up:  Pharmacist Review   [place], [facility] [place] Pharmacist Assistant [phone]   Need to assure adherence to HTN meds. -KDay Fill Dates Per Dispense Report - Filled Inappropriately Amlodipine: 07/20/20 - 90DS, 04/07/20 - 90DS (greater than 5 day gap) Losartan: 05/10/20 - 90DS  Reviewed by: De Blanch, PharmD Clinical Pharmacist [facility] [phone]

## 2020-09-07 ENCOUNTER — Telehealth: Payer: Self-pay | Admitting: Pharmacist

## 2020-09-07 NOTE — Progress Notes (Signed)
Patient called stating she would like to change her 09-08-20 appointment to 09-23-20 at 2:45.

## 2020-09-08 ENCOUNTER — Telehealth: Payer: Medicare Other

## 2020-09-10 ENCOUNTER — Other Ambulatory Visit: Payer: Self-pay | Admitting: Cardiovascular Disease

## 2020-09-10 NOTE — Telephone Encounter (Signed)
Refill request

## 2020-09-15 ENCOUNTER — Telehealth: Payer: Medicare Other

## 2020-09-16 DIAGNOSIS — H401111 Primary open-angle glaucoma, right eye, mild stage: Secondary | ICD-10-CM | POA: Diagnosis not present

## 2020-09-16 DIAGNOSIS — H401122 Primary open-angle glaucoma, left eye, moderate stage: Secondary | ICD-10-CM | POA: Diagnosis not present

## 2020-09-23 ENCOUNTER — Ambulatory Visit: Payer: Medicare Other | Admitting: Pharmacist

## 2020-09-23 NOTE — Chronic Care Management (AMB) (Signed)
Chronic Care Management Pharmacy  Name: Evelyn [name]  MRN: [id number] DOB: Jan 10, 1930  Chief Complaint/ HPI  Evelyn [name],  86 y.o. , female presents for their Follow-Up CCM visit with the clinical pharmacist via telephone due to COVID-19 Pandemic.  PCP : Ann Held, DO  Their chronic conditions include: AFib, Hypertension, Hyperlipidemia, Peripheral Artery Disease, Osteoporosis, Glaucoma, Chronic Diarrhea  Office Visits: None since last CCM visit on 06/17/20.   Consult Visit: 08-18-20(Cardiology)  Patient presented in office with [name] for a followup visit regarding peripheral arterial disease and atrial fibrillation.  No medication changes.  Medications: Outpatient Encounter Medications as of 09/23/2020  Medication Sig  . amLODipine (NORVASC) 5 MG tablet TAKE 1 TABLET(5 MG) BY MOUTH DAILY  . calcium carbonate (OS-CAL) 600 MG TABS Take 600 mg by mouth daily.    . Cholecalciferol (VITAMIN D PO) Take 1 capsule by mouth daily. 2000 units  . Coenzyme Q10 (COQ10) 100 MG CAPS Take by mouth.  . ELIQUIS 2.5 MG TABS tablet TAKE 1 TABLET(2.5 MG) BY MOUTH TWICE DAILY  . glucosamine-chondroitin 500-400 MG tablet Take 1 tablet by mouth daily.   . Latanoprostene Bunod (VYZULTA) 0.024 % SOLN Apply 1 drop to eye daily.  Marland Kitchen loperamide (IMODIUM A-D) 2 MG tablet 1 po qd  . metoprolol succinate (TOPROL-XL) 100 MG 24 hr tablet Take 1 tablet (100 mg total) by mouth daily. Take with or immediately following a meal.  . Omega-3 Fatty Acids (FISH OIL) 1000 MG CAPS Take 1 capsule by mouth daily.  . Probiotic Product (UP4 PROBIOTICS ADULT) CAPS 1 po qd  . Red Yeast Rice 600 MG TABS Take 1 tablet by mouth daily.     No facility-administered encounter medications on file as of 09/23/2020.   SDOH Screenings   Alcohol Screen:   . Last Alcohol Screening Score (AUDIT): Not on file  Depression (PHQ2-9): Low Risk   . PHQ-2 Score: 0  Financial Resource Strain: Medium Risk  . Difficulty  of Paying Living Expenses: Somewhat hard  Food Insecurity:   . Worried About Charity fundraiser in the Last Year: Not on file  . Ran Out of Food in the Last Year: Not on file  Housing:   . Last Housing Risk Score: Not on file  Physical Activity:   . Days of Exercise per Week: Not on file  . Minutes of Exercise per Session: Not on file  Social Connections:   . Frequency of Communication with Friends and Family: Not on file  . Frequency of Social Gatherings with Friends and Family: Not on file  . Attends Religious Services: Not on file  . Active Member of Clubs or Organizations: Not on file  . Attends Archivist Meetings: Not on file  . Marital Status: Not on file  Stress:   . Feeling of Stress : Not on file  Tobacco Use: Medium Risk  . Smoking Tobacco Use: Former Smoker  . Smokeless Tobacco Use: Never Used  Transportation Needs:   . Film/video editor (Medical): Not on file  . Lack of Transportation (Non-Medical): Not on file     Current Diagnosis/Assessment:  Goals Addressed            This Visit's Progress   . Chronic Care Management Pharmacy Care Plan       CARE PLAN ENTRY (see longitudinal plan of care for additional care plan information)  Current Barriers:  . Chronic Disease Management support, education, and  care coordination needs related to AFib, Hypertension, Hyperlipidemia, Peripheral Artery Disease, Osteoporosis, Glaucoma, Chronic Diarrhea   Hypertension BP Readings from Last 3 Encounters:  08/18/20 122/76  05/21/20 124/72  04/14/20 110/68   . Pharmacist Clinical Goal(s): o Over the next 90 days, patient will work with PharmD and providers to maintain BP goal <140/90 . Current regimen:   Amlodipine 5mg  daily  Metoprolol succinate 100mg  daily . Patient self care activities - Over the next 90 days, patient will: o Maintain hypertension medication regimen  Hyperlipidemia Lab Results  Component Value Date/Time   LDLCALC 122 (H)  10/08/2019 01:50 PM   LDLDIRECT 130.5 03/26/2013 09:16 AM   . Pharmacist Clinical Goal(s): o Over the next 90 days, patient will work with PharmD and providers to achieve LDL goal < 70 . Current regimen:   Fish Oil 1000mg  daily  Red yeast rice 600mg  daily . Interventions: o Discussed importance of LDL goal.  . Patient self care activities - Over the next 90 days, patient will: o Read cholesterol book given by your daughter (reduce cholesterol intake) o Maintain cholesterol medication regimen.   Loose Stools . Pharmacist Clinical Goal(s) o Over the next 90 days, patient will work with PharmD and providers to reduce symptoms of loose stools . Current regimen:  o Loperamide 2mg  daily o Probiotic daily o Fiber . Patient self care activities - Over the next 90 days, patient will: o Maintain bowel medication regimen.  Osteoporosis . Pharmacist Clinical Goal(s) o Over the next 90 days, patient will work with PharmD and providers to reduce risk of fracture due to osteoporosis . Current regimen:   Calcium 600mg  daily  Vitamin D 2000 units daily . Interventions: o Recommend patient to get daily intake of 1200mg  calcium through diet and/or supplementation o Consider repeat DEXA (noting patient may not want to begin treatment for osteoporosis) . Patient self care activities - Over the next 90 days, patient will: o Get daily intake of 1200mg  calcium through diet and/or supplementation  Medication management . Pharmacist Clinical Goal(s): o Over the next 90 days, patient will work with PharmD and providers to maintain optimal medication adherence . Current pharmacy: [facility] . Interventions o Comprehensive medication review performed. o Continue current medication management strategy . Patient self care activities - Over the next 90 days, patient will: o Focus on medication adherence by filling and taking medications appropriately  o Take medications as prescribed o Report any  questions or concerns to PharmD and/or provider(s)  Please see past updates related to this goal by clicking on the "Past Updates" button in the selected goal        Social Hx:  Widowed since 1999.  Has 3 children. Son in [place]. Two daughters live in [place].  Has 3 grandchildren. One in [place] and two at [place]. 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  She is one of four children, but all siblings are deceased.  Mother was a Marine scientist for 25 years. She worked 3rd shift and that was hard on the family.  Diet She admits to eating a lot of fried foods and sweets.  Meals vary. She doesn't have the same meal each Lynnett Langlinais. Cereal for breakfast is the most consistent. B - Cereal and whatever fruit she has around plus 2 cups of coffee "I enjoy my mugs of coffee" L - Sometimes goes out to eat for lunch. Today she is having leftover fish.  D -  Tonight she is having a ground round burger Snacks - Rarely Drinks - Only drinks coffee during the morning, about 2-3 days has a beer, sometimes has a mixed drink She wants to increase her water intake 06/17/20 - Hasn't measured her intake of water, but she is trying to drink "fiber water"  Exercise Doesn't walk as much because she [facility]  She rides a [employer] occasionally. She wants to try to do it daily. She is also lifting weights.   Med Management  Uses a pill box to organize her medications weekly on Sunday morning.  Admits that she has forgotten to place apixaban in her pill box for the morning dose.   Hypertension   BP goal is:  <140/90  Office blood pressures are  BP Readings from Last 3 Encounters:  08/18/20 122/76  05/21/20 124/72  04/14/20 110/68   Patient checks BP at home infrequently Patient home BP readings are ranging: "It's been real good!"  Patient has failed these meds in the past: losartan (suspected GI side  effects) Patient is currently controlled on the following medications:  . Amlodipine 5mg  daily (changed from losartan due to GI symptoms)  Fill Dates Per Dispense Report - Filled Inappropriately Amlodipine: 07/20/20 - 90DS, 04/07/20 - 90DS (greater than 5 Amyiah Gaba gap)  Reinforced importance of adherence  We discussed BP goal  Plan -Continue current medications     Hyperlipidemia   Lipid Panel     Component Value Date/Time   CHOL 201 (H) 10/08/2019 1350   TRIG 107.0 10/08/2019 1350   HDL 57.00 10/08/2019 1350   LDLCALC 122 (H) 10/08/2019 1350   LDLDIRECT 130.5 03/26/2013 0916    LDL goal <70  The ASCVD Risk score ([place]., et al., 2013) failed to calculate for the following reasons:   The 2013 ASCVD risk score is only valid for ages 37 to 61   Patient has failed these meds in past: Statins ("it made me crazy") Patient is currently uncontrolled on the following medications:   Fish Oil 1000mg  daily  Red yeast rice 600mg  daily  Denies that she had muscle aches from the medication. She states her daughter has a book on cholesterol and she plans to read this to become more conscientious about her cholesterol intake  We discussed:  LDL goals and what can cause increase in cholesterol. Discussed medication options noting pt's age and current pill burden.   Update 06/17/20 Read her book about cholesterol and appreciated the information. "Take Charge of Cholesterol: How to lower the bad and raise the good" by Earle Gell. She has decided to give up cream in her coffee.  She is planning to do skim or fat free milk vs whole milk.  She is more conscientious about her diet.  "Hero" = HDL "Lethal" = LDL  Update 09/23/20 Trying to get away from whole milk   Plan -Continue current medications    Chronic Diarrhea    Patient has failed these meds in past: None noted  Patient is currently stable on the following medications:   Probiotic  Loperamide 2mg  daily  She is  nervous about her GI visit tomorrow.  She feels like her BM are not as firm as they should be. She states she has been dealing with this for about 5 months. Even with taking immodium daily she feels like her bowels are not normal. She feels the urge to go more in the morning. She feels  like her diet hasn't changed in the last 5 months.  Admits she may not have enough fiber in her diet.  "I eat my share of sweets" She admits she eats dessert after lunch and dinner most dinner.  She eats cookies, lemon ice box meringue pie, dark chocolate  She states she also loves greens.   Update 06/17/20 Drinking "fiber water", taking immodium.  No pain, and diarrhea is better, but not completely resolved.   More soft and more frequent  Update 09/23/20 Symptoms improved, but just not "firm like they ought to be" Takes immodium PRN (about once a week)   Plan -Continue current medications   AFIB   Patient is currently rate controlled.  Patient has failed these meds in past: Xarelto (hematuria) Patient is currently controlled on the following medications:   Eliquis 2.5mg  twice daily  Metoprolol Succinate 100mg  daily  Patient states she is in the donut hole and her Eliquis is expensive, but she feels she does not qualify for patient assistance.  Discussed the income threshold and she feels she makes more than the threshold.   Plan -Continue current medications   Future Plan -Could consider application to PAP if patient is within income threshold    Knee Pain    Patient has failed these meds in past: None noted  Patient is currently improving on the following medications: . None  States she couldn't walk about a week ago. Had no way to get to doctor's visit. Did not want to call ED Is using crutches. Feels her knee has tightness and weakness, but no pain. Currently wearing knee sleeve. Feels like her knee is on the way to recovery. Wonders what else can be done.  Recommended pt to do  knee flexion/extension while sitting Discussed fall prevention strategies.  Plan -Consider exercising knee by doing knee flexion/extension while sitting. -Practice care and safety when walking.   Miscellaneous Meds  Vitamin C (has slowed down on this d/t be  Glucosamine-chondroitin (feels these is helpful for her bones)  De Blanch, PharmD Clinical Pharmacist [facility] [phone]

## 2020-09-23 NOTE — Patient Instructions (Signed)
Visit Information  Goals Addressed            This Visit's Progress    Chronic Care Management Pharmacy Care Plan       CARE PLAN ENTRY (see longitudinal plan of care for additional care plan information)  Current Barriers:   Chronic Disease Management support, education, and care coordination needs related to AFib, Hypertension, Hyperlipidemia, Peripheral Artery Disease, Osteoporosis, Glaucoma, Chronic Diarrhea   Hypertension BP Readings from Last 3 Encounters:  08/18/20 122/76  05/21/20 124/72  04/14/20 110/68    Pharmacist Clinical Goal(s): o Over the next 90 days, patient will work with PharmD and providers to maintain BP goal <140/90  Current regimen:   Amlodipine 5mg  daily  Metoprolol succinate 100mg  daily  Patient self care activities - Over the next 90 days, patient will: o Maintain hypertension medication regimen  Hyperlipidemia Lab Results  Component Value Date/Time   LDLCALC 122 (H) 10/08/2019 01:50 PM   LDLDIRECT 130.5 03/26/2013 09:16 AM    Pharmacist Clinical Goal(s): o Over the next 90 days, patient will work with PharmD and providers to achieve LDL goal < 70  Current regimen:   Fish Oil 1000mg  daily  Red yeast rice 600mg  daily  Interventions: o Discussed importance of LDL goal.   Patient self care activities - Over the next 90 days, patient will: o Read cholesterol book given by your daughter (reduce cholesterol intake) o Maintain cholesterol medication regimen.   Loose Stools  Pharmacist Clinical Goal(s) o Over the next 90 days, patient will work with PharmD and providers to reduce symptoms of loose stools  Current regimen:  o Loperamide 2mg  daily o Probiotic daily o Fiber  Patient self care activities - Over the next 90 days, patient will: o Maintain bowel medication regimen.  Osteoporosis  Pharmacist Clinical Goal(s) o Over the next 90 days, patient will work with PharmD and providers to reduce risk of fracture due to  osteoporosis  Current regimen:   Calcium 600mg  daily  Vitamin D 2000 units daily  Interventions: o Recommend patient to get daily intake of 1200mg  calcium through diet and/or supplementation o Consider repeat DEXA (noting patient may not want to begin treatment for osteoporosis)  Patient self care activities - Over the next 90 days, patient will: o Get daily intake of 1200mg  calcium through diet and/or supplementation  Medication management  Pharmacist Clinical Goal(s): o Over the next 90 days, patient will work with PharmD and providers to maintain optimal medication adherence  Current pharmacy: [facility]  Interventions o Comprehensive medication review performed. o Continue current medication management strategy  Patient self care activities - Over the next 90 days, patient will: o Focus on medication adherence by filling and taking medications appropriately  o Take medications as prescribed o Report any questions or concerns to PharmD and/or provider(s)  Please see past updates related to this goal by clicking on the "Past Updates" button in the selected goal         Patient verbalizes understanding of instructions provided today.   Telephone follow up appointment with pharmacy team member scheduled for: 12/24/2020  Melvenia Beam Iris Hairston, PharmD Clinical Pharmacist [facility] [phone]

## 2020-10-06 ENCOUNTER — Ambulatory Visit (INDEPENDENT_AMBULATORY_CARE_PROVIDER_SITE_OTHER): Payer: Medicare Other | Admitting: Family Medicine

## 2020-10-06 ENCOUNTER — Encounter: Payer: Self-pay | Admitting: Family Medicine

## 2020-10-06 ENCOUNTER — Other Ambulatory Visit: Payer: Self-pay

## 2020-10-06 VITALS — BP 130/88 | HR 82 | Temp 97.6°F | Resp 18 | Ht 62.0 in | Wt 122.6 lb

## 2020-10-06 DIAGNOSIS — E785 Hyperlipidemia, unspecified: Secondary | ICD-10-CM

## 2020-10-06 DIAGNOSIS — E2839 Other primary ovarian failure: Secondary | ICD-10-CM | POA: Diagnosis not present

## 2020-10-06 DIAGNOSIS — M810 Age-related osteoporosis without current pathological fracture: Secondary | ICD-10-CM

## 2020-10-06 DIAGNOSIS — Z23 Encounter for immunization: Secondary | ICD-10-CM

## 2020-10-06 DIAGNOSIS — I10 Essential (primary) hypertension: Secondary | ICD-10-CM | POA: Diagnosis not present

## 2020-10-06 DIAGNOSIS — Z0000 Encounter for general adult medical examination without abnormal findings: Secondary | ICD-10-CM | POA: Diagnosis not present

## 2020-10-06 DIAGNOSIS — I4891 Unspecified atrial fibrillation: Secondary | ICD-10-CM

## 2020-10-06 LAB — LIPID PANEL
Cholesterol: 206 mg/dL — ABNORMAL HIGH (ref 0–200)
HDL: 68.8 mg/dL (ref >39.00)
LDL Cholesterol: 117 mg/dL — ABNORMAL HIGH (ref 0–99)
NonHDL: 137.14
Total CHOL/HDL Ratio: 3
Triglycerides: 102 mg/dL (ref 0.0–149.0)
VLDL: 20.4 mg/dL (ref 0.0–40.0)

## 2020-10-06 LAB — CBC WITH DIFFERENTIAL/PLATELET
Basophils Absolute: 0.1 10*3/uL (ref 0.0–0.1)
Basophils Relative: 0.9 % (ref 0.0–3.0)
Eosinophils Absolute: 0.1 10*3/uL (ref 0.0–0.7)
Eosinophils Relative: 1.7 % (ref 0.0–5.0)
HCT: 44.5 % (ref 36.0–46.0)
Hemoglobin: 14.8 g/dL (ref 12.0–15.0)
Lymphocytes Relative: 36.5 % (ref 12.0–46.0)
Lymphs Abs: 2.5 10*3/uL (ref 0.7–4.0)
MCHC: 33.2 g/dL (ref 30.0–36.0)
MCV: 94.1 fl (ref 78.0–100.0)
Monocytes Absolute: 0.7 10*3/uL (ref 0.1–1.0)
Monocytes Relative: 10.9 % (ref 3.0–12.0)
Neutro Abs: 3.4 10*3/uL (ref 1.4–7.7)
Neutrophils Relative %: 50 % (ref 43.0–77.0)
Platelets: 221 10*3/uL (ref 150.0–400.0)
RBC: 4.73 Mil/uL (ref 3.87–5.11)
RDW: 13.7 % (ref 11.5–15.5)
WBC: 6.8 10*3/uL (ref 4.0–10.5)

## 2020-10-06 LAB — MICROALBUMIN / CREATININE URINE RATIO
Creatinine,U: 12.5 mg/dL
Microalb Creat Ratio: 8.1 mg/g (ref 0.0–30.0)
Microalb, Ur: 1 mg/dL (ref 0.0–1.9)

## 2020-10-06 MED ORDER — AMLODIPINE BESYLATE 5 MG PO TABS: ORAL_TABLET | ORAL | 3 refills | Status: DC

## 2020-10-06 NOTE — Assessment & Plan Note (Signed)
Encouraged heart healthy diet, increase exercise, avoid trans fats, consider a krill oil cap daily 

## 2020-10-06 NOTE — Assessment & Plan Note (Signed)
On anticoagulation  F/u  cardiology 

## 2020-10-06 NOTE — Progress Notes (Signed)
Subjective:     Evelyn [name] is a 86 y.o. female and is here for a comprehensive physical exam. The patient reports no problems. She needs f/u bmd, flu shot and labs for bp and cholesterol .  Social History   Socioeconomic History  . Marital status: Widowed    Spouse name: Not on file  . Number of children: Not on file  . Years of education: Not on file  . Highest education level: Not on file  Occupational History  . Not on file  Tobacco Use  . Smoking status: Former Smoker    Packs/day: 0.75    Years: 18.00    Pack years: 13.50    Types: Cigarettes    Quit date: 02/20/1973    Years since quitting: 47.6  . Smokeless tobacco: Never Used  Substance and Sexual Activity  . Alcohol use: Yes  . Drug use: No  . Sexual activity: Yes    Partners: Male  Other Topics Concern  . Not on file  Social History Narrative   Exercise--no   Social Determinants of Health   Financial Resource Strain: Medium Risk  . Difficulty of Paying Living Expenses: Somewhat hard  Food Insecurity:   . Worried About Charity fundraiser in the Last Year: Not on file  . Ran Out of Food in the Last Year: Not on file  Transportation Needs:   . Lack of Transportation (Medical): Not on file  . Lack of Transportation (Non-Medical): Not on file  Physical Activity:   . Days of Exercise per Week: Not on file  . Minutes of Exercise per Session: Not on file  Stress:   . Feeling of Stress : Not on file  Social Connections:   . Frequency of Communication with Friends and Family: Not on file  . Frequency of Social Gatherings with Friends and Family: Not on file  . Attends Religious Services: Not on file  . Active Member of Clubs or Organizations: Not on file  . Attends Archivist Meetings: Not on file  . Marital Status: Not on file  Intimate Partner Violence:   . Fear of Current or Ex-Partner: Not on file  . Emotionally Abused: Not on file  . Physically Abused: Not on file  . Sexually Abused: Not  on file   Health Maintenance  Topic Date Due  . URINE MICROALBUMIN  Never done  . MAMMOGRAM  10/15/2015  . TETANUS/TDAP  08/04/2021  . INFLUENZA VACCINE  Completed  . DEXA SCAN  Completed  . COVID-19 Vaccine  Completed  . PNA vac Low Risk Adult  Completed    The following portions of the patient's history were reviewed and updated as appropriate:  She  has a past medical history of Atrial fibrillation ([place]), Hyperlipidemia, Hypertension, Osteoporosis, and Post-menopausal. She does not have any pertinent problems on file. She  has a past surgical history that includes Abdominal hysterectomy and Breast lumpectomy (1975). Her family history includes Arthritis in her mother and sister; COPD in her father and sister; Glaucoma in her father; Heart attack (age of onset: 84) in her father; Heart disease in her mother; Hypertension in her sister; Prostate cancer in her father. She  reports that she quit smoking about 47 years ago. Her smoking use included cigarettes. She has a 13.50 pack-year smoking history. She has never used smokeless tobacco. She reports current alcohol use. She reports that she does not use drugs. She has a current medication list which includes the following  prescription(s): amlodipine, calcium carbonate, vitamin d, coq10, eliquis, glucosamine-chondroitin, latanoprostene bunod, loperamide, metoprolol succinate, fish oil, up4 probiotics adult, and red yeast rice. Current Outpatient Medications on File Prior to Visit  Medication Sig Dispense Refill  . calcium carbonate (OS-CAL) 600 MG TABS Take 600 mg by mouth daily.      . Cholecalciferol (VITAMIN D PO) Take 1 capsule by mouth daily. 2000 units    . Coenzyme Q10 (COQ10) 100 MG CAPS Take by mouth.    . ELIQUIS 2.5 MG TABS tablet TAKE 1 TABLET(2.5 MG) BY MOUTH TWICE DAILY 180 tablet 1  . glucosamine-chondroitin 500-400 MG tablet Take 1 tablet by mouth daily.     . Latanoprostene Bunod (VYZULTA) 0.024 % SOLN Apply 1 drop to eye  daily.    Marland Kitchen loperamide (IMODIUM A-D) 2 MG tablet 1 po qd 30 tablet 0  . metoprolol succinate (TOPROL-XL) 100 MG 24 hr tablet Take 1 tablet (100 mg total) by mouth daily. Take with or immediately following a meal. 90 tablet 3  . Omega-3 Fatty Acids (FISH OIL) 1000 MG CAPS Take 1 capsule by mouth daily.    . Probiotic Product (UP4 PROBIOTICS ADULT) CAPS 1 po qd    . Red Yeast Rice 600 MG TABS Take 1 tablet by mouth daily.       No current facility-administered medications on file prior to visit.   She is allergic to vicodin [hydrocodone-acetaminophen], pletaal [cilostazol], and statins..  Review of Systems Review of Systems  Constitutional: Negative for activity change, appetite change and fatigue.  HENT: Negative for hearing loss, congestion, tinnitus and ear discharge.  dentist q70m Eyes: Negative for visual disturbance (see optho q1y -- vision corrected to 20/20 with glasses).  Respiratory: Negative for cough, chest tightness and shortness of breath.   Cardiovascular: Negative for chest pain, palpitations and leg swelling.  Gastrointestinal: Negative for abdominal pain, diarrhea, constipation and abdominal distention.  Genitourinary: Negative for urgency, frequency, decreased urine volume and difficulty urinating.  Musculoskeletal: Negative for back pain, arthralgias and gait problem.  Skin: Negative for color change, pallor and rash.  Neurological: Negative for dizziness, light-headedness, numbness and headaches.  Hematological: Negative for adenopathy. Does not bruise/bleed easily.  Psychiatric/Behavioral: Negative for suicidal ideas, confusion, sleep disturbance, self-injury, dysphoric mood, decreased concentration and agitation.       Objective:    BP 130/88 (BP Location: Right Arm, Patient Position: Sitting, Cuff Size: Normal)   Pulse 82   Temp 97.6 F (36.4 C) (Oral)   Resp 18   Ht 5\' 2"  (1.575 m)   Wt 122 lb 9.6 oz (55.6 kg)   SpO2 97%   BMI 22.42 kg/m  General  appearance: alert, cooperative, appears stated age and no distress Head: Normocephalic, without obvious abnormality, atraumatic Eyes: negative findings: lids and lashes normal, conjunctivae and sclerae normal and pupils equal, round, reactive to light and accomodation Ears: normal TM's and external ear canals both ears Neck: no adenopathy, no carotid bruit, no JVD, supple, symmetrical, trachea midline and thyroid not enlarged, symmetric, no tenderness/mass/nodules Back: symmetric, no curvature. ROM normal. No CVA tenderness. Lungs: clear to auscultation bilaterally Breasts: normal appearance, no masses or tenderness Heart: regular rate and rhythm, S1, S2 normal, no murmur, click, rub or gallop Abdomen: soft, non-tender; bowel sounds normal; no masses,  no organomegaly Pelvic: not indicated; status post hysterectomy, negative ROS Extremities: extremities normal, atraumatic, no cyanosis or edema Pulses: 2+ and symmetric Skin: Skin color, texture, turgor normal. No rashes or lesions Lymph nodes: Cervical,  supraclavicular, and axillary nodes normal. Neurologic: Alert and oriented X 3, normal strength and tone. Normal symmetric reflexes. Normal coordination and gait    Assessment:    Healthy female exam.   Plan:    ghm utd Check labs  See After Visit Summary for Counseling Recommendations    1. Need for influenza vaccination  - Flu Vaccine QUAD High Dose(Fluad)  2. Primary hypertension Well controlled, no changes to meds. Encouraged heart healthy diet such as the DASH diet and exercise as tolerated.   - amLODipine (NORVASC) 5 MG tablet; TAKE 1 TABLET(5 MG) BY MOUTH DAILY  Dispense: 90 tablet; Refill: 3 - Microalbumin / creatinine urine ratio - CBC with Differential/Platelet - Lipid panel  3. Preventative health care See above   4. Hyperlipidemia, unspecified hyperlipidemia type Encouraged heart healthy diet, increase exercise, avoid trans fats, consider a krill oil cap daily -  CBC with Differential/Platelet - Lipid panel  5. Atrial fibrillation, unspecified type ([place]) On anticoagulation , per cardiology  - CBC with Differential/Platelet  6. Estrogen deficiency  - DG Bone Density; Future  7. Essential hypertension Well controlled, no changes to meds. Encouraged heart healthy diet such as the DASH diet and exercise as tolerated.    8. Age-related osteoporosis without current pathological fracture Check bmd

## 2020-10-06 NOTE — Assessment & Plan Note (Signed)
Well controlled, no changes to meds. Encouraged heart healthy diet such as the DASH diet and exercise as tolerated.  °

## 2020-10-06 NOTE — Patient Instructions (Addendum)
Preventive Care 84 Years and Older, Female Preventive care refers to lifestyle choices and visits with your health care provider that can promote health and wellness. This includes:  A yearly physical exam. This is also called an annual well check.  Regular dental and eye exams.  Immunizations.  Screening for certain conditions.  Healthy lifestyle choices, such as diet and exercise. What can I expect for my preventive care visit? Physical exam Your health care provider will check:  Height and weight. These may be used to calculate body mass index (BMI), which is a measurement that tells if you are at a healthy weight.  Heart rate and blood pressure.  Your skin for abnormal spots. Counseling Your health care provider may ask you questions about:  Alcohol, tobacco, and drug use.  Emotional well-being.  Home and relationship well-being.  Sexual activity.  Eating habits.  History of falls.  Memory and ability to understand (cognition).  Work and work Statistician.  Pregnancy and menstrual history. What immunizations do I need?  Influenza (flu) vaccine  This is recommended every year. Tetanus, diphtheria, and pertussis (Tdap) vaccine  You may need a Td booster every 10 years. Varicella (chickenpox) vaccine  You may need this vaccine if you have not already been vaccinated. Zoster (shingles) vaccine  You may need this after age 33. Pneumococcal conjugate (PCV13) vaccine  One dose is recommended after age 33. Pneumococcal polysaccharide (PPSV23) vaccine  One dose is recommended after age 72. Measles, mumps, and rubella (MMR) vaccine  You may need at least one dose of MMR if you were born in 1957 or later. You may also need a second dose. Meningococcal conjugate (MenACWY) vaccine  You may need this if you have certain conditions. Hepatitis A vaccine  You may need this if you have certain conditions or if you travel or work in places where you may be exposed  to hepatitis A. Hepatitis B vaccine  You may need this if you have certain conditions or if you travel or work in places where you may be exposed to hepatitis B. Haemophilus influenzae type b (Hib) vaccine  You may need this if you have certain conditions. You may receive vaccines as individual doses or as more than one vaccine together in one shot (combination vaccines). Talk with your health care provider about the risks and benefits of combination vaccines. What tests do I need? Blood tests  Lipid and cholesterol levels. These may be checked every 5 years, or more frequently depending on your overall health.  Hepatitis C test.  Hepatitis B test. Screening  Lung cancer screening. You may have this screening every year starting at age 39 if you have a 30-pack-year history of smoking and currently smoke or have quit within the past 15 years.  Colorectal cancer screening. All adults should have this screening starting at age 36 and continuing until age 15. Your health care provider may recommend screening at age 23 if you are at increased risk. You will have tests every 1-10 years, depending on your results and the type of screening test.  Diabetes screening. This is done by checking your blood sugar (glucose) after you have not eaten for a while (fasting). You may have this done every 1-3 years.  Mammogram. This may be done every 1-2 years. Talk with your health care provider about how often you should have regular mammograms.  BRCA-related cancer screening. This may be done if you have a family history of breast, ovarian, tubal, or peritoneal cancers.  Other tests  Sexually transmitted disease (STD) testing.  Bone density scan. This is done to screen for osteoporosis. You may have this done starting at age 71. Follow these instructions at home: Eating and drinking  Eat a diet that includes fresh fruits and vegetables, whole grains, lean protein, and low-fat dairy products. Limit  your intake of foods with high amounts of sugar, saturated fats, and salt.  Take vitamin and mineral supplements as recommended by your health care provider.  Do not drink alcohol if your health care provider tells you not to drink.  If you drink alcohol: ? Limit how much you have to 0-1 drink a day. ? Be aware of how much alcohol is in your drink. In the [place] one drink equals one 12 oz bottle of beer (355 mL), one 5 oz glass of wine (148 mL), or one 1 oz glass of hard liquor (44 mL). Lifestyle  Take daily care of your teeth and gums.  Stay active. Exercise for at least 30 minutes on 5 or more days each week.  Do not use any products that contain nicotine or tobacco, such as cigarettes, e-cigarettes, and chewing tobacco. If you need help quitting, ask your health care provider.  If you are sexually active, practice safe sex. Use a condom or other form of protection in order to prevent STIs (sexually transmitted infections).  Talk with your health care provider about taking a low-dose aspirin or statin. What's next?  Go to your health care provider once a year for a well check visit.  Ask your health care provider how often you should have your eyes and teeth checked.  Stay up to date on all vaccines. This information is not intended to replace advice given to you by your health care provider. Make sure you discuss any questions you have with your health care provider. Document Revised: 11/08/2018 Document Reviewed: 11/08/2018 Elsevier Patient Education  [place].    High-Fiber Diet Fiber, also called dietary fiber, is a type of carbohydrate that is found in fruits, vegetables, whole grains, and beans. A high-fiber diet can have many health benefits. Your health care provider may recommend a high-fiber diet to help: Prevent constipation. Fiber can make your bowel movements more regular. Lower your cholesterol. Relieve the following conditions: Swelling of veins in the  anus (hemorrhoids). Swelling and irritation (inflammation) of specific areas of the digestive tract (uncomplicated diverticulosis). A problem of the large intestine (colon) that sometimes causes pain and diarrhea (irritable bowel syndrome, IBS). Prevent overeating as part of a weight-loss plan. Prevent heart disease, type 2 diabetes, and certain cancers. What is my plan? The recommended daily fiber intake in grams (g) includes: 38 g for men age 6 or younger. 30 g for men over age 5. 53 g for women age 44 or younger. 21 g for women over age 33. You can get the recommended daily intake of dietary fiber by: Eating a variety of fruits, vegetables, grains, and beans. Taking a fiber supplement, if it is not possible to get enough fiber through your diet. What do I need to know about a high-fiber diet? It is better to get fiber through food sources rather than from fiber supplements. There is not a lot of research about how effective supplements are. Always check the fiber content on the nutrition facts label of any prepackaged food. Look for foods that contain 5 g of fiber or more per serving. Talk with a diet and nutrition specialist (dietitian) if you  have questions about specific foods that are recommended or not recommended for your medical condition, especially if those foods are not listed below. Gradually increase how much fiber you consume. If you increase your intake of dietary fiber too quickly, you may have bloating, cramping, or gas. Drink plenty of water. Water helps you to digest fiber. What are tips for following this plan? Eat a wide variety of high-fiber foods. Make sure that half of the grains that you eat each day are whole grains. Eat breads and cereals that are made with whole-grain flour instead of refined flour or white flour. Eat brown rice, bulgur wheat, or millet instead of white rice. Start the day with a breakfast that is high in fiber, such as a cereal that contains  5 g of fiber or more per serving. Use beans in place of meat in soups, salads, and pasta dishes. Eat high-fiber snacks, such as berries, raw vegetables, nuts, and popcorn. Choose whole fruits and vegetables instead of processed forms like juice or sauce. What foods can I eat?  Fruits Berries. Pears. Apples. Oranges. Avocado. Prunes and raisins. Dried figs. Vegetables Sweet potatoes. Spinach. Kale. Artichokes. Cabbage. Broccoli. Cauliflower. Green peas. Carrots. Squash. Grains Whole-grain breads. Multigrain cereal. Oats and oatmeal. Brown rice. Barley. Bulgur wheat. [place]. Quinoa. Bran muffins. Popcorn. Rye wafer crackers. Meats and other proteins Navy, kidney, and pinto beans. Soybeans. Split peas. Lentils. Nuts and seeds. Dairy Fiber-fortified yogurt. Beverages Fiber-fortified soy milk. Fiber-fortified orange juice. Other foods Fiber bars. The items listed above may not be a complete list of recommended foods and beverages. Contact a dietitian for more options. What foods are not recommended? Fruits Fruit juice. Cooked, strained fruit. Vegetables Fried potatoes. Canned vegetables. Well-cooked vegetables. Grains White bread. Pasta made with refined flour. White rice. Meats and other proteins Fatty cuts of meat. Fried chicken or fried fish. Dairy Milk. Yogurt. Cream cheese. Sour cream. Fats and oils Butters. Beverages Soft drinks. Other foods Cakes and pastries. The items listed above may not be a complete list of foods and beverages to avoid. Contact a dietitian for more information. Summary Fiber is a type of carbohydrate. It is found in fruits, vegetables, whole grains, and beans. There are many health benefits of eating a high-fiber diet, such as preventing constipation, lowering blood cholesterol, helping with weight loss, and reducing your risk of heart disease, diabetes, and certain cancers. Gradually increase your intake of fiber. Increasing too fast can result in  cramping, bloating, and gas. Drink plenty of water while you increase your fiber. The best sources of fiber include whole fruits and vegetables, whole grains, nuts, seeds, and beans. This information is not intended to replace advice given to you by your health care provider. Make sure you discuss any questions you have with your health care provider. Document Revised: 09/18/2017 Document Reviewed: 09/18/2017 Elsevier Patient Education  2020 [employer].

## 2020-10-06 NOTE — Assessment & Plan Note (Signed)
Check bmd con't vita d and calcium

## 2020-11-13 ENCOUNTER — Telehealth: Payer: Self-pay | Admitting: Pharmacist

## 2020-11-13 NOTE — Progress Notes (Signed)
Chronic Care Management Pharmacy Assistant   Name: Evelyn [name]  MRN: [id number] DOB: 04/13/1930  Reason for Encounter: HTN Disease State  Patient Questions:  1.  Have you seen any other providers since your last visit? Yes  2.  Any changes in your medicines or health? No    PCP : Ann Held, DO   Their chronic conditions include: AFib, Hypertension, Hyperlipidemia, Peripheral Artery Disease, Osteoporosis, Glaucoma, Chronic Diarrhea.  Office Visits: 10-06-2020 (PCP) Patient presented in the office for her comprehensive physical exam. There were no medication changes. Lab work ordered.  Allergies:   Allergies  Allergen Reactions  . Vicodin [Hydrocodone-Acetaminophen] Other (See Comments)    Abdominal Pain  . Pletaal [Cilostazol] Diarrhea  . Statins     Medications: Outpatient Encounter Medications as of 11/13/2020  Medication Sig  . amLODipine (NORVASC) 5 MG tablet TAKE 1 TABLET(5 MG) BY MOUTH DAILY  . calcium carbonate (OS-CAL) 600 MG TABS Take 600 mg by mouth daily.    . Cholecalciferol (VITAMIN D PO) Take 1 capsule by mouth daily. 2000 units  . Coenzyme Q10 (COQ10) 100 MG CAPS Take by mouth.  . ELIQUIS 2.5 MG TABS tablet TAKE 1 TABLET(2.5 MG) BY MOUTH TWICE DAILY  . glucosamine-chondroitin 500-400 MG tablet Take 1 tablet by mouth daily.   . Latanoprostene Bunod (VYZULTA) 0.024 % SOLN Apply 1 drop to eye daily.  Marland Kitchen loperamide (IMODIUM A-D) 2 MG tablet 1 po qd  . metoprolol succinate (TOPROL-XL) 100 MG 24 hr tablet Take 1 tablet (100 mg total) by mouth daily. Take with or immediately following a meal.  . Omega-3 Fatty Acids (FISH OIL) 1000 MG CAPS Take 1 capsule by mouth daily.  . Probiotic Product (UP4 PROBIOTICS ADULT) CAPS 1 po qd  . Red Yeast Rice 600 MG TABS Take 1 tablet by mouth daily.     No facility-administered encounter medications on file as of 11/13/2020.    Current Diagnosis: Patient Active Problem List   Diagnosis Date Noted  .  Hyperlipidemia 04/14/2020  . Seasonal allergies 11/27/2019  . Atrial fibrillation ([place]) 03/26/2019  . Anticoagulated 03/26/2019  . Loose stools 04/14/2015  . Osteoarthritis 04/14/2015  . Chronic flank pain 06/17/2013  . Plantar fasciitis 05/25/2013  . PAD (peripheral artery disease) ([place]) 03/07/2013  . Pain in joint, lower leg 02/05/2013  . CARDIAC MURMUR 06/29/2010  . VARICOSE VEINS, LOWER EXTREMITIES 06/02/2010  . PLANTAR WART, LEFT 04/03/2009  . MOLE 04/03/2009  . HIP PAIN, RIGHT 04/03/2009  . Dyslipidemia, goal LDL below 70 06/29/2007  . ARTIFICIAL MENOPAUSE 06/29/2007  . Pain in limb 06/29/2007  . BREAST BIOPSY, HX OF 06/29/2007  . GLAUCOMA, LEFT EYE 04/27/2007  . Essential hypertension 04/27/2007  . Osteoporosis 04/27/2007    Goals Addressed   None    Reviewed chart prior to disease state call. Spoke with patient regarding BP  Recent Office Vitals: BP Readings from Last 3 Encounters:  10/06/20 130/88  08/18/20 122/76  05/21/20 124/72   Pulse Readings from Last 3 Encounters:  10/06/20 82  08/18/20 (!) 57  05/21/20 73    Wt Readings from Last 3 Encounters:  10/06/20 122 lb 9.6 oz (55.6 kg)  08/18/20 122 lb 9.6 oz (55.6 kg)  05/21/20 124 lb 9.6 oz (56.5 kg)     Kidney Function Lab Results  Component Value Date/Time   CREATININE 0.65 10/08/2019 01:50 PM   CREATININE 0.80 07/23/2019 12:24 PM   CREATININE 0.86 05/04/2018 03:49 PM  GFR 86.07 10/08/2019 01:50 PM   GFRNONAA 67 07/23/2019 12:24 PM   GFRAA 77 07/23/2019 12:24 PM    BMP Latest Ref Rng & Units 10/08/2019 07/23/2019 09/04/2018  Glucose 70 - 99 mg/dL 96 81 94  BUN 6 - 23 mg/dL 12 13 14   Creatinine 0.40 - 1.20 mg/dL 0.65 0.80 0.72  BUN/Creat Ratio 12 - 28 - 16 -  Sodium 135 - 145 mEq/L 139 141 138  Potassium 3.5 - 5.1 mEq/L 4.3 4.9 4.4  Chloride 96 - 112 mEq/L 100 100 101  CO2 19 - 32 mEq/L 31 26 29   Calcium 8.4 - 10.5 mg/dL 9.6 10.0 9.8    . Current antihypertensive regimen:   Amlodipine  5mg  daily  Metoprolol succinate 100mg  daily   Attempted to ask patient the last fill date for amlodipine to ensure she is adherent.   Third unsuccessful telephone outreach was attempted today. The patient was referred to the pharmacist for assistance with care management and care coordination.    Follow-Up:  Pharmacist Review   [name], [place] Pharmacist Assistant [phone]

## 2020-12-14 ENCOUNTER — Telehealth: Payer: Self-pay | Admitting: Cardiovascular Disease

## 2020-12-14 MED ORDER — APIXABAN 2.5 MG PO TABS
ORAL_TABLET | ORAL | 0 refills | Status: DC
Start: 2020-12-14 — End: 2021-09-09

## 2020-12-14 NOTE — Telephone Encounter (Signed)
Spoke with patient of Dr. Fletcher Anon. She states she ran out of her Eliquis, took last dose yesterday AM. She said her normal [facility] pharmacy was closed over the weekend and today. She asked that I send prescription to [facility]. She will call them to see if they are open/do delivery. Explained our office is closed today so we are unable to offer samples. She will call back if any issues

## 2020-12-14 NOTE — Telephone Encounter (Signed)
    Pt c/o medication issue:  1. Name of Medication:   ELIQUIS 2.5 MG TABS tablet   2. How are you currently taking this medication (dosage and times per day)? TAKE 1 TABLET(2.5 MG) BY MOUTH TWICE DAILY  3. Are you having a reaction (difficulty breathing--STAT)?   4. What is your medication issue? Pt said she took her last pill of her eliquis yesterday. Over the weekend and today her pharmacy is closed and don't know when they're going to be open. She doesn't know what to do. She wanted to know if its ok to miss a dose of her eliquis
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
## 2020-12-24 ENCOUNTER — Ambulatory Visit: Payer: Medicare Other

## 2020-12-24 DIAGNOSIS — E785 Hyperlipidemia, unspecified: Secondary | ICD-10-CM

## 2020-12-24 DIAGNOSIS — I4821 Permanent atrial fibrillation: Secondary | ICD-10-CM

## 2020-12-24 DIAGNOSIS — I10 Essential (primary) hypertension: Secondary | ICD-10-CM

## 2020-12-24 NOTE — Patient Instructions (Addendum)
Visit Information  Goals Addressed            This Visit's Progress   . Chronic Care Management Pharmacy Care Plan       CARE PLAN ENTRY (see longitudinal plan of care for additional care plan information)  Current Barriers:  . Chronic Disease Management support, education, and care coordination needs related to AFib, Hypertension, Hyperlipidemia, Peripheral Artery Disease, Osteoporosis, Glaucoma, Chronic Diarrhea   Hypertension BP Readings from Last 3 Encounters:  10/06/20 130/88  08/18/20 122/76  05/21/20 124/72   . Pharmacist Clinical Goal(s): o Over the next 90 days, patient will work with PharmD and providers to maintain BP goal <140/90 . Current regimen:   Amlodipine 5mg  daily  Metoprolol succinate 100mg  daily . Patient self care activities - Over the next 90 days, patient will: o Maintain hypertension medication regimen  Hyperlipidemia Lab Results  Component Value Date/Time   LDLCALC 117 (H) 10/06/2020 10:11 AM   LDLDIRECT 130.5 03/26/2013 09:16 AM   . Pharmacist Clinical Goal(s): o Over the next 90 days, patient will work with PharmD and providers to achieve LDL goal < 70 . Current regimen:   Fish Oil 1000mg  daily  Red yeast rice 600mg  daily . Interventions: o Discussed importance of LDL goal.  o Reviewed most recent lipid panel . Patient self care activities - Over the next 90 days, patient will: o Read cholesterol book given by your daughter (reduce cholesterol intake) o Maintain cholesterol medication regimen.   Loose Stools . Pharmacist Clinical Goal(s) o Over the next 90 days, patient will work with PharmD and providers to reduce symptoms of loose stools . Current regimen:  o Loperamide 2mg  daily o Probiotic daily o Fiber . Patient self care activities - Over the next 90 days, patient will: o Maintain bowel medication regimen.  Osteoporosis . Pharmacist Clinical Goal(s) o Over the next 90 days, patient will work with PharmD and providers to  reduce risk of fracture due to osteoporosis . Current regimen:   Calcium 600mg  daily  Vitamin D 2000 units daily . Interventions: o Recommend patient to get daily intake of 1200mg  calcium through diet and/or supplementation o Consider repeat DEXA (noting patient may not want to begin treatment for osteoporosis) . Patient self care activities - Over the next 90 days, patient will: o Get daily intake of 1200mg  calcium through diet and/or supplementation  Medication management . Pharmacist Clinical Goal(s): o Over the next 90 days, patient will work with PharmD and providers to maintain optimal medication adherence . Current pharmacy: [facility] . Interventions o Comprehensive medication review performed. o Continue current medication management strategy . Patient self care activities - Over the next 90 days, patient will: o Focus on medication adherence by filling and taking medications appropriately  o Take medications as prescribed o Report any questions or concerns to PharmD and/or provider(s)  Please see past updates related to this goal by clicking on the "Past Updates" button in the selected goal         The patient verbalized understanding of instructions, educational materials, and care plan provided today and agreed to receive a mailed copy of patient instructions, educational materials, and care plan.    Telephone follow up appointment with pharmacy team member scheduled for: 3 months  Edythe Clarity, [facility]  High Cholesterol  High cholesterol is a condition in which the blood has high levels of a white, waxy substance similar to fat (cholesterol). The liver makes all the cholesterol that the body needs.  The human body needs small amounts of cholesterol to help build cells. A person gets extra or excess cholesterol from the food that he or she eats. The blood carries cholesterol from the liver to the rest of the body. If you have high cholesterol, deposits (plaques) may  build up on the walls of your arteries. Arteries are the blood vessels that carry blood away from your heart. These plaques make the arteries narrow and stiff. Cholesterol plaques increase your risk for heart attack and stroke. Work with your health care provider to keep your cholesterol levels in a healthy range. What increases the risk? The following factors may make you more likely to develop this condition:  Eating foods that are high in animal fat (saturated fat) or cholesterol.  Being overweight.  Not getting enough exercise.  A family history of high cholesterol (familial hypercholesterolemia).  Use of tobacco products.  Having diabetes. What are the signs or symptoms? There are no symptoms of this condition. How is this diagnosed? This condition may be diagnosed based on the results of a blood test.  If you are older than 85 years of age, your health care provider may check your cholesterol levels every 4-6 years.  You may be checked more often if you have high cholesterol or other risk factors for heart disease. The blood test for cholesterol measures:  "Bad" cholesterol, or LDL cholesterol. This is the main type of cholesterol that causes heart disease. The desired level is less than 100 mg/dL.  "Good" cholesterol, or HDL cholesterol. HDL helps protect against heart disease by cleaning the arteries and carrying the LDL to the liver for processing. The desired level for HDL is 60 mg/dL or higher.  Triglycerides. These are fats that your body can store or burn for energy. The desired level is less than 150 mg/dL.  Total cholesterol. This measures the total amount of cholesterol in your blood and includes LDL, HDL, and triglycerides. The desired level is less than 200 mg/dL. How is this treated? This condition may be treated with:  Diet changes. You may be asked to eat foods that have more fiber and less saturated fats or added sugar.  Lifestyle changes. These may include  regular exercise, maintaining a healthy weight, and quitting use of tobacco products.  Medicines. These are given when diet and lifestyle changes have not worked. You may be prescribed a statin medicine to help lower your cholesterol levels. Follow these instructions at home: Eating and drinking  Eat a healthy, balanced diet. This diet includes: ? Daily servings of a variety of fresh, frozen, or canned fruits and vegetables. ? Daily servings of whole grain foods that are rich in fiber. ? Foods that are low in saturated fats and trans fats. These include poultry and fish without skin, lean cuts of meat, and low-fat dairy products. ? A variety of fish, especially oily fish that contain omega-3 fatty acids. Aim to eat fish at least 2 times a week.  Avoid foods and drinks that have added sugar.  Use healthy cooking methods, such as roasting, grilling, broiling, baking, poaching, steaming, and stir-frying. Do not fry your food except for stir-frying.   Lifestyle  Get regular exercise. Aim to exercise for a total of 150 minutes a week. Increase your activity level by doing activities such as gardening, walking, and taking the stairs.  Do not use any products that contain nicotine or tobacco, such as cigarettes, e-cigarettes, and chewing tobacco. If you need help quitting, ask your  health care provider.   General instructions  Take over-the-counter and prescription medicines only as told by your health care provider.  Keep all follow-up visits as told by your health care provider. This is important. Where to find more information  American Heart Association: www.heart.org  National Heart, Lung, and Blood Institute: https://wilson-eaton.com/ Contact a health care provider if:  You have trouble achieving or maintaining a healthy diet or weight.  You are starting an exercise program.  You are unable to stop smoking. Get help right away if:  You have chest pain.  You have trouble  breathing.  You have any symptoms of a stroke. "BE FAST" is an easy way to remember the main warning signs of a stroke: ? B - Balance. Signs are dizziness, sudden trouble walking, or loss of balance. ? E - Eyes. Signs are trouble seeing or a sudden change in vision. ? F - Face. Signs are sudden weakness or numbness of the face, or the face or eyelid drooping on one side. ? A - Arms. Signs are weakness or numbness in an arm. This happens suddenly and usually on one side of the body. ? S - Speech. Signs are sudden trouble speaking, slurred speech, or trouble understanding what people say. ? T - Time. Time to call emergency services. Write down what time symptoms started.  You have other signs of a stroke, such as: ? A sudden, severe headache with no known cause. ? Nausea or vomiting. ? Seizure. These symptoms may represent a serious problem that is an emergency. Do not wait to see if the symptoms will go away. Get medical help right away. Call your local emergency services (911 in the [place] Do not drive yourself to the hospital. Summary  Cholesterol plaques increase your risk for heart attack and stroke. Work with your health care provider to keep your cholesterol levels in a healthy range.  Eat a healthy, balanced diet, get regular exercise, and maintain a healthy weight.  Do not use any products that contain nicotine or tobacco, such as cigarettes, e-cigarettes, and chewing tobacco.  Get help right away if you have any symptoms of a stroke. This information is not intended to replace advice given to you by your health care provider. Make sure you discuss any questions you have with your health care provider. Document Revised: 10/14/2019 Document Reviewed: 10/14/2019 Elsevier Patient Education  2021 [employer].

## 2021-01-27 DIAGNOSIS — H401122 Primary open-angle glaucoma, left eye, moderate stage: Secondary | ICD-10-CM | POA: Diagnosis not present

## 2021-01-27 DIAGNOSIS — H401111 Primary open-angle glaucoma, right eye, mild stage: Secondary | ICD-10-CM | POA: Diagnosis not present

## 2021-01-28 ENCOUNTER — Telehealth: Payer: Self-pay | Admitting: Pharmacist

## 2021-01-28 NOTE — Progress Notes (Addendum)
Chronic Care Management Pharmacy Assistant   Name: Evelyn [name]  MRN: [id number] DOB: November 10, 1930  Reason for Encounter: Disease State For CHL.  Patient Questions:  1.  Have you seen any other providers since your last visit? No.    2.  Any changes in your medicines or health? No.   PCP : Ann Held, DO  Their chronic conditions include: AFib, Hypertension, Hyperlipidemia, Peripheral Artery Disease, Osteoporosis, Glaucoma, Chronic Diarrhea.  Office Visits: None since 12/24/20  Consults: None since 12/24/20  Allergies:   Allergies  Allergen Reactions   Vicodin [Hydrocodone-Acetaminophen] Other (See Comments)    Abdominal Pain   Pletaal [Cilostazol] Diarrhea   Statins     Medications: Outpatient Encounter Medications as of 01/28/2021  Medication Sig   amLODipine (NORVASC) 5 MG tablet TAKE 1 TABLET(5 MG) BY MOUTH DAILY   apixaban (ELIQUIS) 2.5 MG TABS tablet TAKE 1 TABLET(2.5 MG) BY MOUTH TWICE DAILY   calcium carbonate (OS-CAL) 600 MG TABS Take 600 mg by mouth daily.   Cholecalciferol (VITAMIN D PO) Take 1 capsule by mouth daily. 2000 units   Coenzyme Q10 (COQ10) 100 MG CAPS Take by mouth.   glucosamine-chondroitin 500-400 MG tablet Take 1 tablet by mouth daily.    Latanoprostene Bunod 0.024 % SOLN Apply 1 drop to eye daily.   loperamide (IMODIUM A-D) 2 MG tablet 1 po qd   metoprolol succinate (TOPROL-XL) 100 MG 24 hr tablet Take 1 tablet (100 mg total) by mouth daily. Take with or immediately following a meal.   Omega-3 Fatty Acids (FISH OIL) 1000 MG CAPS Take 1 capsule by mouth daily.   Probiotic Product (UP4 PROBIOTICS ADULT) CAPS 1 po qd   Red Yeast Rice 600 MG TABS Take 1 tablet by mouth daily.   No facility-administered encounter medications on file as of 01/28/2021.    Current Diagnosis: Patient Active Problem List   Diagnosis Date Noted   Hyperlipidemia 04/14/2020   Seasonal allergies 11/27/2019   Atrial fibrillation ([place]) 03/26/2019    Anticoagulated 03/26/2019   Loose stools 04/14/2015   Osteoarthritis 04/14/2015   Chronic flank pain 06/17/2013   Plantar fasciitis 05/25/2013   PAD (peripheral artery disease) ([place]) 03/07/2013   Pain in joint, lower leg 02/05/2013   CARDIAC MURMUR 06/29/2010   VARICOSE VEINS, LOWER EXTREMITIES 06/02/2010   PLANTAR WART, LEFT 04/03/2009   MOLE 04/03/2009   HIP PAIN, RIGHT 04/03/2009   Dyslipidemia, goal LDL below 70 06/29/2007   ARTIFICIAL MENOPAUSE 06/29/2007   Pain in limb 06/29/2007   BREAST BIOPSY, HX OF 06/29/2007   GLAUCOMA, LEFT EYE 04/27/2007   Essential hypertension 04/27/2007   Osteoporosis 04/27/2007    Goals Addressed   None    01/28/2021 Name: Evelyn [name] MRN: [id number] DOB: 1930-02-01 [name] Evelyn [name] is a 87 y.o. year old female who is a primary care patient of Ann Held, DO.  Comprehensive medication review performed; Spoke to patient regarding cholesterol  Lipid Panel    Component Value Date/Time   CHOL 206 (H) 10/06/2020 1011   TRIG 102.0 10/06/2020 1011   HDL 68.80 10/06/2020 1011   LDLCALC 117 (H) 10/06/2020 1011   LDLDIRECT 130.5 03/26/2013 0916    10-year ASCVD risk score: The ASCVD Risk score Mikey Bussing DC Jr., et al., 2013) failed to calculate for the following reasons:   The 2013 ASCVD risk score is only valid for ages 56 to 86  Current antihyperlipidemic regimen:  Fish Oil 1000mg  daily  Red yeast rice 600mg  daily  Previous antihyperlipidemic medications tried: None.  ASCVD risk enhancing conditions: age >79 and HTN   What recent interventions/DTPs have been made by any provider to improve Cholesterol control since last CPP Visit: None.  Any recent hospitalizations or ED visits since last visit with CPP? Patient stated no.  What diet changes have been made to improve Cholesterol?  Patient stated she eats activa yogurt. Patient stated she drinks fiber water, V8 juice, and water.  What exercise is being done to improve Cholesterol?   Patient stated she does things around the house and the yard. She stated she has a stationary bike that she uses sometimes but not regularly.   Adherence Review: Does the patient have >5 day gap between last estimated fill dates? No  Patient stated she doesn't have any questions or concerns about her medication at this time.  Follow-Up:  Pharmacist Review   Charlann Lange, RMA Clinical Pharmacist Assistant [phone]  6 minutes spent in review, coordination, and documentation.  Reviewed by: Beverly Milch, PharmD Clinical Pharmacist [facility] [phone]

## 2021-03-25 ENCOUNTER — Telehealth: Payer: Medicare Other

## 2021-04-06 ENCOUNTER — Telehealth: Payer: Medicare Other | Admitting: Family Medicine

## 2021-04-13 DIAGNOSIS — L570 Actinic keratosis: Secondary | ICD-10-CM | POA: Diagnosis not present

## 2021-04-13 DIAGNOSIS — X32XXXD Exposure to sunlight, subsequent encounter: Secondary | ICD-10-CM | POA: Diagnosis not present

## 2021-04-13 DIAGNOSIS — L820 Inflamed seborrheic keratosis: Secondary | ICD-10-CM | POA: Diagnosis not present

## 2021-04-15 ENCOUNTER — Other Ambulatory Visit: Payer: Self-pay

## 2021-04-15 ENCOUNTER — Ambulatory Visit: Payer: Medicare Other | Attending: Internal Medicine

## 2021-04-15 ENCOUNTER — Ambulatory Visit (INDEPENDENT_AMBULATORY_CARE_PROVIDER_SITE_OTHER): Payer: Medicare Other | Admitting: Family Medicine

## 2021-04-15 ENCOUNTER — Encounter: Payer: Self-pay | Admitting: Family Medicine

## 2021-04-15 VITALS — BP 116/80 | HR 90 | Temp 98.6°F | Resp 18 | Ht 62.0 in | Wt 122.8 lb

## 2021-04-15 DIAGNOSIS — R195 Other fecal abnormalities: Secondary | ICD-10-CM | POA: Diagnosis not present

## 2021-04-15 DIAGNOSIS — Z23 Encounter for immunization: Secondary | ICD-10-CM

## 2021-04-15 DIAGNOSIS — E785 Hyperlipidemia, unspecified: Secondary | ICD-10-CM

## 2021-04-15 DIAGNOSIS — I10 Essential (primary) hypertension: Secondary | ICD-10-CM | POA: Diagnosis not present

## 2021-04-15 DIAGNOSIS — I4821 Permanent atrial fibrillation: Secondary | ICD-10-CM | POA: Diagnosis not present

## 2021-04-15 NOTE — Assessment & Plan Note (Signed)
On eliquis  °Per cardiology °

## 2021-04-15 NOTE — Patient Instructions (Signed)

## 2021-04-15 NOTE — Assessment & Plan Note (Signed)
Well controlled, no changes to meds. Encouraged heart healthy diet such as the DASH diet and exercise as tolerated.  °

## 2021-04-15 NOTE — Assessment & Plan Note (Signed)
Encouraged heart healthy diet, increase exercise, avoid trans fats, consider a krill oil cap daily 

## 2021-04-15 NOTE — Progress Notes (Signed)
Subjective:   By signing my name below, I, Shehryar Baig, attest that this documentation has been prepared under the direction and in the presence of Dr. Roma Schanz, DO. 04/15/2021      Patient ID: Evelyn [name], female    DOB: July 08, 1930, 87 y.o.   MRN: [id number]  No chief complaint on file.   HPI Patient is in today for a office visit. She is doing well at this time. She complains of frequent bowel movements for the past year. She has started eating more yogurt and water to manage this issue but has only found mild relief. She went to a gastroenterologist and was recommended she eat more fiber and vegetables. She was instructed to schedule further appointments with her gastroenterologist if her symptoms have not improved which she reports have not at this time.  She also complains of veins on her ankle getting enlarged. She has gotten 3 Covid-19 vaccinations and is willing to get the 4th booster vaccine.   Past Medical History:  Diagnosis Date  . Atrial fibrillation ([place])   . Hyperlipidemia   . Hypertension   . Osteoporosis   . Post-menopausal    HRT    Past Surgical History:  Procedure Laterality Date  . ABDOMINAL HYSTERECTOMY    . BREAST LUMPECTOMY  1975    Family History  Problem Relation Age of Onset  . Arthritis Mother   . Heart disease Mother   . Heart attack Father 78  . Prostate cancer Father   . COPD Father   . Glaucoma Father   . Hypertension Sister   . COPD Sister   . Arthritis Sister     Social History   Socioeconomic History  . Marital status: Widowed    Spouse name: Not on file  . Number of children: Not on file  . Years of education: Not on file  . Highest education level: Not on file  Occupational History  . Not on file  Tobacco Use  . Smoking status: Former Smoker    Packs/day: 0.75    Years: 18.00    Pack years: 13.50    Types: Cigarettes    Quit date: 02/20/1973    Years since quitting: 48.1  . Smokeless tobacco: Never Used   Substance and Sexual Activity  . Alcohol use: Yes  . Drug use: No  . Sexual activity: Yes    Partners: Male  Other Topics Concern  . Not on file  Social History Narrative   Exercise--no   Social Determinants of Health   Financial Resource Strain: Medium Risk  . Difficulty of Paying Living Expenses: Somewhat hard  Food Insecurity: Not on file  Transportation Needs: Not on file  Physical Activity: Not on file  Stress: Not on file  Social Connections: Not on file  Intimate Partner Violence: Not on file    Outpatient Medications Prior to Visit  Medication Sig Dispense Refill  . amLODipine (NORVASC) 5 MG tablet TAKE 1 TABLET(5 MG) BY MOUTH DAILY 90 tablet 3  . apixaban (ELIQUIS) 2.5 MG TABS tablet TAKE 1 TABLET(2.5 MG) BY MOUTH TWICE DAILY 180 tablet 0  . calcium carbonate (OS-CAL) 600 MG TABS Take 600 mg by mouth daily.    . Cholecalciferol (VITAMIN D PO) Take 1 capsule by mouth daily. 2000 units    . Coenzyme Q10 (COQ10) 100 MG CAPS Take by mouth.    Marland Kitchen glucosamine-chondroitin 500-400 MG tablet Take 1 tablet by mouth daily.     Nolon Lennert  Bunod 0.024 % SOLN Apply 1 drop to eye daily.    Marland Kitchen loperamide (IMODIUM A-D) 2 MG tablet 1 po qd 30 tablet 0  . metoprolol succinate (TOPROL-XL) 100 MG 24 hr tablet Take 1 tablet (100 mg total) by mouth daily. Take with or immediately following a meal. 90 tablet 3  . Omega-3 Fatty Acids (FISH OIL) 1000 MG CAPS Take 1 capsule by mouth daily.    . Probiotic Product (UP4 PROBIOTICS ADULT) CAPS 1 po qd    . Red Yeast Rice 600 MG TABS Take 1 tablet by mouth daily.     No facility-administered medications prior to visit.    Allergies  Allergen Reactions  . Vicodin [Hydrocodone-Acetaminophen] Other (See Comments)    Abdominal Pain  . Pletaal [Cilostazol] Diarrhea  . Statins     ROS     Objective:    Physical Exam Constitutional:      Appearance: Normal appearance.  HENT:     Head: Normocephalic and atraumatic.     Right Ear:  External ear normal.     Left Ear: External ear normal.  Eyes:     Extraocular Movements: Extraocular movements intact.     Pupils: Pupils are equal, round, and reactive to light.  Cardiovascular:     Rate and Rhythm: Normal rate and regular rhythm.     Pulses: Normal pulses.     Heart sounds: Normal heart sounds. No murmur heard. No gallop.   Pulmonary:     Effort: Pulmonary effort is normal. No respiratory distress.     Breath sounds: Normal breath sounds. No wheezing, rhonchi or rales.  Skin:    General: Skin is warm and dry.  Neurological:     Mental Status: She is alert and oriented to person, place, and time.  Psychiatric:        Behavior: Behavior normal.     There were no vitals taken for this visit. Wt Readings from Last 3 Encounters:  10/06/20 122 lb 9.6 oz (55.6 kg)  08/18/20 122 lb 9.6 oz (55.6 kg)  05/21/20 124 lb 9.6 oz (56.5 kg)    Diabetic Foot Exam - Simple   No data filed    Lab Results  Component Value Date   WBC 6.8 10/06/2020   HGB 14.8 10/06/2020   HCT 44.5 10/06/2020   PLT 221.0 10/06/2020   GLUCOSE 96 10/08/2019   CHOL 206 (H) 10/06/2020   TRIG 102.0 10/06/2020   HDL 68.80 10/06/2020   LDLDIRECT 130.5 03/26/2013   LDLCALC 117 (H) 10/06/2020   ALT 19 10/08/2019   AST 22 10/08/2019   NA 139 10/08/2019   K 4.3 10/08/2019   CL 100 10/08/2019   CREATININE 0.65 10/08/2019   BUN 12 10/08/2019   CO2 31 10/08/2019   TSH 3.12 03/12/2020   MICROALBUR 1.0 10/06/2020    Lab Results  Component Value Date   TSH 3.12 03/12/2020   Lab Results  Component Value Date   WBC 6.8 10/06/2020   HGB 14.8 10/06/2020   HCT 44.5 10/06/2020   MCV 94.1 10/06/2020   PLT 221.0 10/06/2020   Lab Results  Component Value Date   NA 139 10/08/2019   K 4.3 10/08/2019   CO2 31 10/08/2019   GLUCOSE 96 10/08/2019   BUN 12 10/08/2019   CREATININE 0.65 10/08/2019   BILITOT 0.9 10/08/2019   ALKPHOS 62 10/08/2019   AST 22 10/08/2019   ALT 19 10/08/2019    PROT 7.8 10/08/2019   ALBUMIN 4.3  10/08/2019   CALCIUM 9.6 10/08/2019   GFR 86.07 10/08/2019   Lab Results  Component Value Date   CHOL 206 (H) 10/06/2020   Lab Results  Component Value Date   HDL 68.80 10/06/2020   Lab Results  Component Value Date   LDLCALC 117 (H) 10/06/2020   Lab Results  Component Value Date   TRIG 102.0 10/06/2020   Lab Results  Component Value Date   CHOLHDL 3 10/06/2020   No results found for: HGBA1C     Assessment & Plan:   Problem List Items Addressed This Visit   None      No orders of the defined types were placed in this encounter.   I, Dr. Roma Schanz, DO, personally preformed the services described in this documentation.  All medical record entries made by the scribe were at my direction and in my presence.  I have reviewed the chart and discharge instructions (if applicable) and agree that the record reflects my personal performance and is accurate and complete. 04/15/2021   I,Shehryar Baig,acting as a scribe for Ann Held, DO.,have documented all relevant documentation on the behalf of Ann Held, DO,as directed by  Ann Held, DO while in the presence of Ann Held, DO.   Shehryar [employer]

## 2021-04-16 ENCOUNTER — Other Ambulatory Visit (HOSPITAL_BASED_OUTPATIENT_CLINIC_OR_DEPARTMENT_OTHER): Payer: Self-pay

## 2021-04-16 ENCOUNTER — Ambulatory Visit (INDEPENDENT_AMBULATORY_CARE_PROVIDER_SITE_OTHER): Payer: Medicare Other | Admitting: Pharmacist

## 2021-04-16 DIAGNOSIS — M810 Age-related osteoporosis without current pathological fracture: Secondary | ICD-10-CM

## 2021-04-16 DIAGNOSIS — I10 Essential (primary) hypertension: Secondary | ICD-10-CM

## 2021-04-16 DIAGNOSIS — H401122 Primary open-angle glaucoma, left eye, moderate stage: Secondary | ICD-10-CM

## 2021-04-16 DIAGNOSIS — E785 Hyperlipidemia, unspecified: Secondary | ICD-10-CM | POA: Diagnosis not present

## 2021-04-16 DIAGNOSIS — H401111 Primary open-angle glaucoma, right eye, mild stage: Secondary | ICD-10-CM

## 2021-04-16 DIAGNOSIS — I4821 Permanent atrial fibrillation: Secondary | ICD-10-CM | POA: Diagnosis not present

## 2021-04-16 DIAGNOSIS — I739 Peripheral vascular disease, unspecified: Secondary | ICD-10-CM

## 2021-04-16 LAB — LIPID PANEL
Cholesterol: 213 mg/dL — ABNORMAL HIGH (ref 0–200)
HDL: 71.2 mg/dL (ref >39.00)
LDL Cholesterol: 122 mg/dL — ABNORMAL HIGH (ref 0–99)
NonHDL: 141.87
Total CHOL/HDL Ratio: 3
Triglycerides: 99 mg/dL (ref 0.0–149.0)
VLDL: 19.8 mg/dL (ref 0.0–40.0)

## 2021-04-16 LAB — COMPREHENSIVE METABOLIC PANEL
ALT: 25 U/L (ref 0–35)
AST: 26 U/L (ref 0–37)
Albumin: 4.4 g/dL (ref 3.5–5.2)
Alkaline Phosphatase: 67 U/L (ref 39–117)
BUN: 10 mg/dL (ref 6–23)
CO2: 30 mEq/L (ref 19–32)
Calcium: 9.6 mg/dL (ref 8.4–10.5)
Chloride: 100 mEq/L (ref 96–112)
Creatinine, Ser: 0.72 mg/dL (ref 0.40–1.20)
GFR: 74.2 mL/min (ref >60.00)
Glucose, Bld: 93 mg/dL (ref 70–99)
Potassium: 4.1 mEq/L (ref 3.5–5.1)
Sodium: 140 mEq/L (ref 135–145)
Total Bilirubin: 1 mg/dL (ref 0.2–1.2)
Total Protein: 7.9 g/dL (ref 6.0–8.3)

## 2021-04-16 MED ORDER — PFIZER-BIONT COVID-19 VAC-TRIS 30 MCG/0.3ML IM SUSP
INTRAMUSCULAR | 0 refills | Status: DC
  Filled 2021-04-16: qty 0.3, 1d supply, fill #0

## 2021-04-16 NOTE — Chronic Care Management (AMB) (Signed)
Chronic Care Management Pharmacy Note  04/16/2021 Name:  Evelyn [name] MRN:  [id number] DOB:  20-Nov-1930  Subjective: Evelyn [name] is an 87 y.o. year old female who is a primary patient of Ann Held, DO.  The CCM team was consulted for assistance with disease management and care coordination needs.    Engaged with patient by telephone for follow up visit in response to provider referral for pharmacy case management and/or care coordination services.   Consent to Services:  The patient was given information about Chronic Care Management services, agreed to services, and gave verbal consent prior to initiation of services.  Please see initial visit note for detailed documentation.   Patient Care Team: Carollee Herter, Alferd Apa, DO as PCP - General Wellington Hampshire, MD as PCP - Cardiology (Cardiology) Jerline Pain Mingo Amber, DO as Consulting Physician (Osteopathic Medicine) Cherre Robins, PharmD (Pharmacist)  Recent office visits: 04/15/2021 - PCP (Dr Darletta Moll health visit; no medication changes; Received 4th COVID vaccine.  Recent consult visits: 09/16/2020 - Ophthal (Dr Jerline Pain) f/u open angle glaucoma - both eyes.   Hospital visits: None in previous 6 months  Objective:  Lab Results  Component Value Date   CREATININE 0.72 04/15/2021   CREATININE 0.65 10/08/2019   CREATININE 0.80 07/23/2019    No results found for: HGBA1C Last diabetic Eye exam: No results found for: HMDIABEYEEXA  Last diabetic Foot exam: No results found for: HMDIABFOOTEX      Component Value Date/Time   CHOL 213 (H) 04/15/2021 1350   TRIG 99.0 04/15/2021 1350   HDL 71.20 04/15/2021 1350   CHOLHDL 3 04/15/2021 1350   VLDL 19.8 04/15/2021 1350   LDLCALC 122 (H) 04/15/2021 1350   LDLDIRECT 130.5 03/26/2013 0916    Hepatic Function Latest Ref Rng & Units 04/15/2021 10/08/2019 09/04/2018  Total Protein 6.0 - 8.3 g/dL 7.9 7.8 8.1  Albumin 3.5 - 5.2 g/dL 4.4 4.3 4.3  AST 0 - 37 U/L '26 22  19  ' ALT 0 - 35 U/L '25 19 22  ' Alk Phosphatase 39 - 117 U/L 67 62 66  Total Bilirubin 0.2 - 1.2 mg/dL 1.0 0.9 0.7  Bilirubin, Direct 0.0 - 0.3 mg/dL - - -    Lab Results  Component Value Date/Time   TSH 3.12 03/12/2020 03:20 PM   TSH 2.85 01/28/2013 10:58 AM    CBC Latest Ref Rng & Units 10/06/2020 05/21/2020 03/12/2020  WBC 4.0 - 10.5 K/uL 6.8 6.9 7.5  Hemoglobin 12.0 - 15.0 g/dL 14.8 13.8 14.4  Hematocrit 36.0 - 46.0 % 44.5 40.9 42.0  Platelets 150.0 - 400.0 K/uL 221.0 188.0 190.0    Lab Results  Component Value Date/Time   VD25OH 61 06/29/2010 12:00 AM   VD25OH 54 03/25/2009 08:44 PM    Clinical ASCVD: Yes  - PAD The ASCVD Risk score (Goff DC Jr., et al., 2013) failed to calculate for the following reasons:   The 2013 ASCVD risk score is only valid for ages 57 to 14    Other: CHADS2VASc = 5  Social History   Tobacco Use  Smoking Status Former Smoker  . Packs/day: 0.75  . Years: 18.00  . Pack years: 13.50  . Types: Cigarettes  . Quit date: 02/20/1973  . Years since quitting: 48.1  Smokeless Tobacco Never Used   BP Readings from Last 3 Encounters:  04/15/21 116/80  10/06/20 130/88  08/18/20 122/76   Pulse Readings from Last 3 Encounters:  04/15/21 90  10/06/20 82  08/18/20 (!) 57   Wt Readings from Last 3 Encounters:  04/15/21 122 lb 12.8 oz (55.7 kg)  10/06/20 122 lb 9.6 oz (55.6 kg)  08/18/20 122 lb 9.6 oz (55.6 kg)    Assessment: Review of patient past medical history, allergies, medications, health status, including review of consultants reports, laboratory and other test data, was performed as part of comprehensive evaluation and provision of chronic care management services.   SDOH:  (Social Determinants of Health) assessments and interventions performed:    CCM Care Plan  Allergies  Allergen Reactions  . Vicodin [Hydrocodone-Acetaminophen] Other (See Comments)    Abdominal Pain  . Pletaal [Cilostazol] Diarrhea  . Statins     Medications  Reviewed Today    Reviewed by Cherre Robins, PharmD (Pharmacist) on 04/16/21 at 39  Med List Status: <None>  Medication Order Taking? Sig Documenting Provider Last Dose Status Informant  amLODipine (NORVASC) 5 MG tablet [id number] Yes TAKE 1 TABLET(5 MG) BY MOUTH Evelyn Carollee Herter, Alferd Apa, DO Taking Active   apixaban (ELIQUIS) 2.5 MG TABS tablet [id number] Yes TAKE 1 TABLET(2.5 MG) BY MOUTH TWICE Evelyn Croitoru, Mihai, MD Taking Active   calcium carbonate (OS-CAL) 600 MG TABS [id number] Yes Take 600 mg by mouth Evelyn. [provider] Taking Active   Cholecalciferol (VITAMIN D PO) [id number] Yes Take 1 capsule by mouth Evelyn. 2000 units [provider] Taking Active   Coenzyme Q10 (COQ10) 100 MG CAPS [id number] Yes Take by mouth. [provider] Taking Active Self  glucosamine-chondroitin 500-400 MG tablet [id number] Yes Take 1 tablet by mouth Evelyn.  [provider] Taking Active   Latanoprostene Bunod 0.024 % SOLN [id number] Yes Apply 1 drop to eye at bedtime. [provider] Taking Active   loperamide (IMODIUM A-D) 2 MG tablet [id number] Yes 1 po qd Carollee Herter, Yvonne R, DO Taking Active   metoprolol succinate (TOPROL-XL) 100 MG 24 hr tablet [id number] Yes Take 1 tablet (100 mg total) by mouth Evelyn. Take with or immediately following a meal. Wellington Hampshire, MD Taking Active   Omega-3 Fatty Acids (FISH OIL) 1000 MG CAPS [id number] Yes Take 1 capsule by mouth Evelyn. [provider] Taking Active   Probiotic Product (UP4 PROBIOTICS ADULT) CAPS [id number] Yes 1 po qd Carollee Herter, Alferd Apa, DO Taking Active   Red Yeast Rice 600 MG TABS [id number] Yes Take 1 tablet by mouth Evelyn. [provider] Taking Active   sodium chloride (MURO 128) 2 % ophthalmic solution [id number] Yes Place 1 drop into both eyes as needed for eye irritation. [provider] Taking Active   timolol (BETIMOL) 0.5 % ophthalmic solution [id number] Yes Place 1 drop into  both eyes 2 (two) times Evelyn. [provider] Taking Active           Patient Active Problem List   Diagnosis Date Noted  . Hyperlipidemia 04/14/2020  . Seasonal allergies 11/27/2019  . Atrial fibrillation ([place]) 03/26/2019  . Anticoagulated 03/26/2019  . Loose stools 04/14/2015  . Osteoarthritis 04/14/2015  . Chronic flank pain 06/17/2013  . Plantar fasciitis 05/25/2013  . PAD (peripheral artery disease) ([place]) 03/07/2013  . Pain in joint, lower leg 02/05/2013  . CARDIAC MURMUR 06/29/2010  . VARICOSE VEINS, LOWER EXTREMITIES 06/02/2010  . PLANTAR WART, LEFT 04/03/2009  . MOLE 04/03/2009  . HIP PAIN, RIGHT 04/03/2009  . Dyslipidemia, goal LDL below 70 06/29/2007  . ARTIFICIAL MENOPAUSE 06/29/2007  . Pain in limb 06/29/2007  . BREAST BIOPSY, HX  OF 06/29/2007  . GLAUCOMA, LEFT EYE 04/27/2007  . Essential hypertension 04/27/2007  . Osteoporosis 04/27/2007    Immunization History  Administered Date(s) Administered  . Fluad Quad(high Dose 65+) 10/08/2019, 10/06/2020  . Influenza Split 09/15/2011, 09/20/2012  . Influenza Whole 11/28/2000, 10/10/2007, 08/20/2008, 09/24/2009, 08/25/2010  . Influenza, High Dose Seasonal PF 10/01/2015, 09/29/2016, 09/04/2018  . Influenza,inj,Quad PF,6+ Mos 08/15/2013, 10/07/2014  . PFIZER Comirnaty(Gray Top)Covid-19 Tri-Sucrose Vaccine 04/15/2021  . PFIZER(Purple Top)SARS-COV-2 Vaccination 01/09/2020, 02/16/2020, 09/16/2020  . Pneumococcal Conjugate-13 10/07/2014  . Pneumococcal Polysaccharide-23 11/28/2000  . Td 11/28/2000  . Tdap 08/05/2011  . Zoster 12/28/2007  . Zoster Recombinat (Shingrix) 04/23/2019, 06/22/2019    Conditions to be addressed/monitored: Atrial Fibrillation, HTN, HLD and PAD; osteoarthritis; glaucoma; osteoporosis  Care Plan : General Pharmacy (Adult)  Updates made by Cherre Robins, PHARMD since 04/16/2021 12:00 AM    Problem: CHL AMB "PATIENT-SPECIFIC PROBLEM"   Priority: High  Onset Date: 04/16/2021  Note:     Current Barriers:  . Unable to achieve control of hyperlipidemia  . Chronic Disease Management support, education, and care coordination needs related to Atrial Fib, Hypertension, Hyperlipidemia, Peripheral Artery Disease, Osteoporosis, Glaucoma, Chronic Diarrhea  Pharmacist Clinical Goal(s):  Marland Kitchen Over the next 180 days, patient will achieve control of lipids as evidenced by LDL <100 . contact provider office for questions/concerns as evidenced notation of same in electronic health record through collaboration with PharmD and provider.   Interventions: . 1:1 collaboration with Carollee Herter, Alferd Apa, DO regarding development and update of comprehensive plan of care as evidenced by provider attestation and co-signature . Inter-disciplinary care team collaboration (see longitudinal plan of care) . Comprehensive medication review performed; medication list updated in electronic medical record   Hypertension . Currently at goal BP of <140/90 . Current regimen:   Amlodipine 49m Evelyn  Metoprolol succinate 1043mdaily . Interventions: o Maintain hypertension medication regimen  Hyperlipidemia Lab Results  Component Value Date/Time   LDLCALC 122 (H) 04/15/2021   LDLCACL 117 10/06/2020   . Not at LDL goal; LDL goal < 100 . Current regimen:   Fish Oil 100074maily  Red yeast rice 600m24mily . Interventions: o Reviewed recent lipid panel results; Discussed LDL goal; She has great HDL and Tg at goal of <150 o Discussed limiting intake of saturated and trans fat o Maintain cholesterol medication regimen.   Loose Stools . Patient was seen yesterday by PCP and continues to experience chronic diarrhea.  . She has not been back to see GI . FOBT given on 04/16/2021 . Current regimen:  o Loperamide 2mg 61mly o Probiotic Evelyn o Fiber powder Evelyn  . Interventions:  o Maintain bowel medication regimen. o Reminded patient to return Fecal Occult Test to office when completed.   o Recommended appt with GI fo further evaluation of chronic diarrhea - patient to think about it.   Osteoporosis . Last DEXA was in 2014;  o Lowest T-Score was -2.7 at neck of left hip. Noted no significant change since previous DEXA in 2012. . No history of fractures . One fall in the last year with no injury; patient tripped over garden hose in yard; has sore tailbone. . Current regimen:   Calcium 600mg 86my  Vitamin D 2000 units Evelyn . Interventions: o Continue Evelyn intake of 1200mg c72mum through diet and/or supplementation o Consider repeat DEXA (noting patient may not want to begin treatment for osteoporosis) o Fall assessment completed and discuss fall prevention  Atrial Fibrillation:  . Patient  denies dizziness or rapid HR . She does mention concern about  broken blood vessels around her ankle and if this could be from Eliquis . Current regimen:  o Eliquis 2.44m twice a day o Metoprolol ER 1057mdaily  . Interventions: o Continue current regimen o Encouraged patient to discuss concerns about Eliquis with her cardiologist but to continue until he recommends otherwise o Financial assessment: per patient she is paying $47/month for Eliquis which is affordable; If she reaches Medicare Coverage Gap she will notify me so we can check on patient assistance eligibility.   Medication management . Pharmacist Clinical Goal(s): o Over the next 90 days, patient will work with PharmD and providers to maintain optimal medication adherence . Current pharmacy: [facility] . Interventions o Comprehensive medication review performed. o Continue current medication management strategy o Adherence reviewed;   Patient Goals/Self-Care Activities . Over the next 180 days, patient will:  take medications as prescribed, collaborate with provider on medication access solutions, and engage in dietary modifications by limiting intake of fried foods, animal products and saturated and trans  fat.  Follow Up Plan: Telephone follow up appointment with care management team member scheduled for:  6 months       Medication Assistance: None required.  Patient affirms current coverage meets needs.  Patient's preferred pharmacy is:  [place] [address]: [phone]: [phone] [place]: [phone]: [phone]1#[id number] SWR[facility] IncF [place]IPaxtonAW[place]one: [phone]: [phone] Up:  Patient agrees to Care Plan and Follow-up.  Plan: Telephone follow up appointment with care management team member scheduled for:  6 months  TaCherre RobinsPharmD Clinical Pharmacist LeFlowery BranchePlantationiTennova [facility]

## 2021-04-16 NOTE — Patient Instructions (Signed)
Visit Information  PATIENT GOALS: Goals Addressed            This Visit's Progress   . Chronic Care Management Pharmacy Care Plan   On track    CARE PLAN ENTRY (see longitudinal plan of care for additional care plan information)  Current Barriers:  . Chronic Disease Management support, education, and care coordination needs related to AFib, Hypertension, Hyperlipidemia, Peripheral Artery Disease, Osteoporosis, Glaucoma, Chronic Diarrhea   Hypertension BP Readings from Last 3 Encounters:  04/15/21 116/80  10/06/20 130/88  08/18/20 122/76   . Pharmacist Clinical Goal(s): o Over the next 90 days, patient will work with PharmD and providers to maintain BP goal <140/90 . Current regimen:   Amlodipine 5mg  daily  Metoprolol succinate 100mg  daily . Patient self care activities - Over the next 90 days, patient will: o Maintain hypertension medication regimen  Hyperlipidemia Lab Results  Component Value Date/Time   LDLCALC 122 (H) 04/15/2021 01:50 PM   LDLDIRECT 130.5 03/26/2013 09:16 AM   . Pharmacist Clinical Goal(s): o Over the next 90 days, patient will work with PharmD and providers to achieve LDL goal < 100 . Current regimen:   Fish Oil 1000mg  daily  Red yeast rice 600mg  daily . Interventions: o Discussed LDL goal.  o Reviewed most recent lipid panel with patient  o Discussed limiting intake of saturated and trans fat . Patient self care activities - Over the next 90 days, patient will: o Patient to increase intake of vegetables and decrease intake of processed and fried foods and also animal products o Maintain cholesterol medication regimen.   Loose Stools . Pharmacist Clinical Goal(s) o Over the next 90 days, patient will work with PharmD and providers to reduce symptoms of loose stools . Current regimen:  o Loperamide 2mg  daily o Probiotic daily o Fiber powder daily  . Patient self care activities - Over the next 90 days, patient will: o Maintain bowel  medication regimen. o Reminded patient to return Fecal Occult Test to office when completed.  o Consider seeing gastroenterologist again since diarrhea not improved.   Osteoporosis . Pharmacist Clinical Goal(s) o Over the next 90 days, patient will work with PharmD and providers to reduce risk of fracture due to osteoporosis . Current regimen:   Calcium 600mg  daily  Vitamin D 2000 units daily . Interventions: o Recommend patient to get daily intake of 1200mg  calcium through diet and/or supplementation o Consider repeat DEXA (noting patient may not want to begin treatment for osteoporosis) o Fall assessment completed and discuss fall prevention . Patient self care activities - Over the next 90 days, patient will: o Get daily intake of 1200mg  calcium through diet and/or supplementation   Atrial Fibrillation:  . Pharmacist Clinical Goal(s) o Over the next 180 day, Patient will work with PharmD and providers to reduce stroke risk . Current regimen:  o Eliquis 2.5mg  twice a day o Metoprolol ER 100mg  daily  . Interventions: o Encouraged patient to discuss concerns about Eliquis with her cardiologist but to continue until he recommends otherwise o Financial assessment: per patient she is paying $47/month for Eliquis which is affordable; If she reaches Medicare Coverage Gap she will notify me so we can check on patient assistance eligibility.  . Patient self care activities - Over the next 90 days, patient will: o Continue current regimen o Discuss concerns of side effects from Eliquis with Dr Fletcher Anon (cardiologist)  o Notify clinical pharmacist is cost of Eliquis increases  Medication management .  Pharmacist Clinical Goal(s): o Over the next 90 days, patient will work with PharmD and providers to maintain optimal medication adherence . Current pharmacy: [facility] . Interventions o Comprehensive medication review performed. o Continue current medication management strategy . Patient  self care activities - Over the next 90 days, patient will: o Focus on medication adherence by filling and taking medications appropriately  o Take medications as prescribed o Report any questions or concerns to PharmD and/or provider(s)  Please see past updates related to this goal by clicking on the "Past Updates" button in the selected goal         The patient verbalized understanding of instructions, educational materials, and care plan provided today and declined offer to receive copy of patient instructions, educational materials, and care plan.   Telephone follow up appointment with care management team member scheduled for: 6 months  Cherre Robins, PharmD Clinical Pharmacist [place]

## 2021-05-18 ENCOUNTER — Other Ambulatory Visit (INDEPENDENT_AMBULATORY_CARE_PROVIDER_SITE_OTHER): Payer: Medicare Other

## 2021-05-18 DIAGNOSIS — R195 Other fecal abnormalities: Secondary | ICD-10-CM | POA: Diagnosis not present

## 2021-05-18 LAB — FECAL OCCULT BLOOD, IMMUNOCHEMICAL: Fecal Occult Bld: NEGATIVE

## 2021-06-02 ENCOUNTER — Telehealth: Payer: Self-pay | Admitting: Pharmacist

## 2021-06-02 DIAGNOSIS — H401111 Primary open-angle glaucoma, right eye, mild stage: Secondary | ICD-10-CM | POA: Diagnosis not present

## 2021-06-02 DIAGNOSIS — H401122 Primary open-angle glaucoma, left eye, moderate stage: Secondary | ICD-10-CM | POA: Diagnosis not present

## 2021-06-02 DIAGNOSIS — H18832 Recurrent erosion of cornea, left eye: Secondary | ICD-10-CM | POA: Diagnosis not present

## 2021-06-02 DIAGNOSIS — H25813 Combined forms of age-related cataract, bilateral: Secondary | ICD-10-CM | POA: Diagnosis not present

## 2021-06-02 NOTE — Chronic Care Management (AMB) (Signed)
    Chronic Care Management Pharmacy Assistant   Name: Baylee Campus  MRN: [id number] DOB: 1930-08-15   Reason for Encounter: Disease State - General     Recent office visits:  None noted.  Recent consult visits:  None noted.  Hospital visits:  None in previous 6 months  Medications: Outpatient Encounter Medications as of 06/02/2021  Medication Sig   amLODipine (NORVASC) 5 MG tablet TAKE 1 TABLET(5 MG) BY MOUTH DAILY   apixaban (ELIQUIS) 2.5 MG TABS tablet TAKE 1 TABLET(2.5 MG) BY MOUTH TWICE DAILY   calcium carbonate (OS-CAL) 600 MG TABS Take 600 mg by mouth daily.   Cholecalciferol (VITAMIN D PO) Take 1 capsule by mouth daily. 2000 units   Coenzyme Q10 (COQ10) 100 MG CAPS Take by mouth.   COVID-19 mRNA Vac-TriS, Pfizer, (PFIZER-BIONT COVID-19 VAC-TRIS) SUSP injection Inject into the muscle.   glucosamine-chondroitin 500-400 MG tablet Take 1 tablet by mouth daily.    Latanoprostene Bunod 0.024 % SOLN Apply 1 drop to eye at bedtime.   loperamide (IMODIUM A-D) 2 MG tablet 1 po qd   metoprolol succinate (TOPROL-XL) 100 MG 24 hr tablet Take 1 tablet (100 mg total) by mouth daily. Take with or immediately following a meal.   Omega-3 Fatty Acids (FISH OIL) 1000 MG CAPS Take 1 capsule by mouth daily.   Probiotic Product (UP4 PROBIOTICS ADULT) CAPS 1 po qd   Red Yeast Rice 600 MG TABS Take 1 tablet by mouth daily.   sodium chloride (MURO 128) 2 % ophthalmic solution Place 1 drop into both eyes as needed for eye irritation.   timolol (BETIMOL) 0.5 % ophthalmic solution Place 1 drop into both eyes 2 (two) times daily.   No facility-administered encounter medications on file as of 06/02/2021.   Have you had any problems recently with your health? Patient states her bowels are very irregular. She states she has increased her fiber and stopping coffee.  Have you had any problems with your pharmacy? Patient states she is unable to afford the eliquis.  What issues or side effects are you  having with your medications? Patient states she is experiencing irregular bowels but is unsure if this is a side effect.  What would you like me to pass along to [place] for them to help you with?  Patient states she is unable to afford the eliquis. Patient states she is still having broken blood vessels.Patient states it looks like her ankle is turning purple.  What can we do to take care of you better?  Patient states there is nothing at this time.   Star Rating Drugs: none noted.  Lizbeth Bark Clinical Pharmacist Assistant [phone]

## 2021-06-09 NOTE — Telephone Encounter (Signed)
Will have CMA check with patient or her pharmacy to see if she is in coverage gap. Pharmacy should be able to give an estimate of how much she has spent  OOO on meds for 2022. Please send patient application for Eliquis PAP if she qualifies / would like.

## 2021-06-10 ENCOUNTER — Telehealth: Payer: Self-pay | Admitting: Pharmacist

## 2021-06-10 NOTE — Chronic Care Management (AMB) (Signed)
    Chronic Care Management Pharmacy Assistant   Name: Dai Apel  MRN: [id number] DOB: September 19, 1930  Reason for Encounter: Patient Assistance Documentation  Medications: Outpatient Encounter Medications as of 06/10/2021  Medication Sig   amLODipine (NORVASC) 5 MG tablet TAKE 1 TABLET(5 MG) BY MOUTH DAILY   apixaban (ELIQUIS) 2.5 MG TABS tablet TAKE 1 TABLET(2.5 MG) BY MOUTH TWICE DAILY   calcium carbonate (OS-CAL) 600 MG TABS Take 600 mg by mouth daily.   Cholecalciferol (VITAMIN D PO) Take 1 capsule by mouth daily. 2000 units   Coenzyme Q10 (COQ10) 100 MG CAPS Take by mouth.   COVID-19 mRNA Vac-TriS, Pfizer, (PFIZER-BIONT COVID-19 VAC-TRIS) SUSP injection Inject into the muscle.   glucosamine-chondroitin 500-400 MG tablet Take 1 tablet by mouth daily.    Latanoprostene Bunod 0.024 % SOLN Apply 1 drop to eye at bedtime.   loperamide (IMODIUM A-D) 2 MG tablet 1 po qd   metoprolol succinate (TOPROL-XL) 100 MG 24 hr tablet Take 1 tablet (100 mg total) by mouth daily. Take with or immediately following a meal.   Omega-3 Fatty Acids (FISH OIL) 1000 MG CAPS Take 1 capsule by mouth daily.   Probiotic Product (UP4 PROBIOTICS ADULT) CAPS 1 po qd   Red Yeast Rice 600 MG TABS Take 1 tablet by mouth daily.   sodium chloride (MURO 128) 2 % ophthalmic solution Place 1 drop into both eyes as needed for eye irritation.   timolol (BETIMOL) 0.5 % ophthalmic solution Place 1 drop into both eyes 2 (two) times daily.   No facility-administered encounter medications on file as of 06/10/2021.   I spoke with [place] pharmacy to confirm patient's oop expenses for 2022. Patient's total oop expense for 2022 is $377. They were not able to confirm coverage gap due to not having active fills.  Several unsuccessful attempts made to contact patient to discuss information for patient assistance screening.  Wilford Sports CPA, CMA

## 2021-07-15 DIAGNOSIS — H25813 Combined forms of age-related cataract, bilateral: Secondary | ICD-10-CM | POA: Diagnosis not present

## 2021-07-15 DIAGNOSIS — H401122 Primary open-angle glaucoma, left eye, moderate stage: Secondary | ICD-10-CM | POA: Diagnosis not present

## 2021-07-15 DIAGNOSIS — H401111 Primary open-angle glaucoma, right eye, mild stage: Secondary | ICD-10-CM | POA: Diagnosis not present

## 2021-08-11 ENCOUNTER — Telehealth: Payer: Medicare Other

## 2021-08-17 ENCOUNTER — Ambulatory Visit (INDEPENDENT_AMBULATORY_CARE_PROVIDER_SITE_OTHER): Payer: Medicare Other | Admitting: Pharmacist

## 2021-08-17 DIAGNOSIS — I739 Peripheral vascular disease, unspecified: Secondary | ICD-10-CM

## 2021-08-17 DIAGNOSIS — I4821 Permanent atrial fibrillation: Secondary | ICD-10-CM

## 2021-08-17 DIAGNOSIS — E785 Hyperlipidemia, unspecified: Secondary | ICD-10-CM

## 2021-08-18 DIAGNOSIS — H25813 Combined forms of age-related cataract, bilateral: Secondary | ICD-10-CM | POA: Diagnosis not present

## 2021-08-18 DIAGNOSIS — H25812 Combined forms of age-related cataract, left eye: Secondary | ICD-10-CM | POA: Diagnosis not present

## 2021-08-20 NOTE — Chronic Care Management (AMB) (Signed)
Chronic Care Management Pharmacy Note  08/20/2021 Name:  Evelyn [name] MRN:  [id number] DOB:  22-Jan-1930  Subjective: Pricilla [name] Evelyn [name] is an 87 y.o. year old female who is a primary patient of Ann Held, DO.  The CCM team was consulted for assistance with disease management and care coordination needs.    Engaged with patient by telephone for follow up visit in response to provider referral for pharmacy case management and/or care coordination services.   Consent to Services:  The patient was given information about Chronic Care Management services, agreed to services, and gave verbal consent prior to initiation of services.  Please see initial visit note for detailed documentation.   Patient Care Team: Carollee Herter, Alferd Apa, DO as PCP - General Wellington Hampshire, MD as PCP - Cardiology (Cardiology) Jerline Pain Mingo Amber, DO as Consulting Physician (Osteopathic Medicine) Cherre Robins, PharmD (Pharmacist)  Recent office visits: 04/15/2021 - PCP (Dr Darletta Moll health visit; no medication changes; Received 4th COVID vaccine.  Recent consult visits: 04/15/2021 - Dermatology (Dr Rozann Lesches) seen for actinic keratosis and seborrheic keratosis.  Hospital visits: None in previous 6 months  Objective:  Lab Results  Component Value Date   CREATININE 0.72 04/15/2021   CREATININE 0.65 10/08/2019   CREATININE 0.80 07/23/2019    No results found for: HGBA1C Last diabetic Eye exam: No results found for: HMDIABEYEEXA  Last diabetic Foot exam: No results found for: HMDIABFOOTEX      Component Value Date/Time   CHOL 213 (H) 04/15/2021 1350   TRIG 99.0 04/15/2021 1350   HDL 71.20 04/15/2021 1350   CHOLHDL 3 04/15/2021 1350   VLDL 19.8 04/15/2021 1350   LDLCALC 122 (H) 04/15/2021 1350   LDLDIRECT 130.5 03/26/2013 0916    Hepatic Function Latest Ref Rng & Units 04/15/2021 10/08/2019 09/04/2018  Total Protein 6.0 - 8.3 g/dL 7.9 7.8 8.1  Albumin 3.5 - 5.2 g/dL 4.4 4.3 4.3   AST 0 - 37 U/L '26 22 19  ' ALT 0 - 35 U/L '25 19 22  ' Alk Phosphatase 39 - 117 U/L 67 62 66  Total Bilirubin 0.2 - 1.2 mg/dL 1.0 0.9 0.7  Bilirubin, Direct 0.0 - 0.3 mg/dL - - -    Lab Results  Component Value Date/Time   TSH 3.12 03/12/2020 03:20 PM   TSH 2.85 01/28/2013 10:58 AM    CBC Latest Ref Rng & Units 10/06/2020 05/21/2020 03/12/2020  WBC 4.0 - 10.5 K/uL 6.8 6.9 7.5  Hemoglobin 12.0 - 15.0 g/dL 14.8 13.8 14.4  Hematocrit 36.0 - 46.0 % 44.5 40.9 42.0  Platelets 150.0 - 400.0 K/uL 221.0 188.0 190.0    Lab Results  Component Value Date/Time   VD25OH 61 06/29/2010 12:00 AM   VD25OH 54 03/25/2009 08:44 PM    Clinical ASCVD: Yes  - PAD The ASCVD Risk score (Arnett DK, et al., 2019) failed to calculate for the following reasons:   The 2019 ASCVD risk score is only valid for ages 58 to 41    Other: CHADS2VASc = 5  Social History   Tobacco Use  Smoking Status Former   Packs/day: 0.75   Years: 18.00   Pack years: 13.50   Types: Cigarettes   Quit date: 02/20/1973   Years since quitting: 48.5  Smokeless Tobacco Never   BP Readings from Last 3 Encounters:  04/15/21 116/80  10/06/20 130/88  08/18/20 122/76   Pulse Readings from Last 3 Encounters:  04/15/21 90  10/06/20 82  08/18/20 (!) 57   Wt Readings from Last 3 Encounters:  04/15/21 122 lb 12.8 oz (55.7 kg)  10/06/20 122 lb 9.6 oz (55.6 kg)  08/18/20 122 lb 9.6 oz (55.6 kg)    Assessment: Review of patient past medical history, allergies, medications, health status, including review of consultants reports, laboratory and other test data, was performed as part of comprehensive evaluation and provision of chronic care management services.   SDOH:  (Social Determinants of Health) assessments and interventions performed:  SDOH Interventions    Flowsheet Row Most Recent Value  SDOH Interventions   Financial Strain Interventions Other (Comment)  [discussed Eliquis patient assistance. Patient given information  regarding income requirement and OOP expense requirement.]  Transportation Interventions Intervention Not Indicated       CCM Care Plan  Allergies  Allergen Reactions   Vicodin [Hydrocodone-Acetaminophen] Other (See Comments)    Abdominal Pain   Pletaal [Cilostazol] Diarrhea   Statins     Medications Reviewed Today     Reviewed by Cherre Robins, PharmD (Pharmacist) on 08/17/21 at 1255  Med List Status: <None>   Medication Order Taking? Sig Documenting Provider Last Dose Status Informant  amLODipine (NORVASC) 5 MG tablet [id number] Yes TAKE 1 TABLET(5 MG) BY MOUTH DAILY Carollee Herter, Alferd Apa, DO Taking Active   apixaban (ELIQUIS) 2.5 MG TABS tablet [id number] Yes TAKE 1 TABLET(2.5 MG) BY MOUTH TWICE DAILY Croitoru, Mihai, MD Taking Active   calcium carbonate (OS-CAL) 600 MG TABS [id number] Yes Take 600 mg by mouth daily. [provider] Taking Active   Cholecalciferol (VITAMIN D PO) [id number] Yes Take 1 capsule by mouth daily. 2000 units [provider] Taking Active   Coenzyme Q10 (COQ10) 100 MG CAPS [id number] Yes Take by mouth. [provider] Taking Active Self  glucosamine-chondroitin 500-400 MG tablet [id number] Yes Take 1 tablet by mouth daily.  [provider] Taking Active   Latanoprostene Bunod 0.024 % SOLN [id number] Yes Apply 1 drop to eye at bedtime. [provider] Taking Active   loperamide (IMODIUM A-D) 2 MG tablet [id number] No 1 po qd  Patient not taking: Reported on 08/17/2021   Ann Held, DO Not Taking Active   metoprolol succinate (TOPROL-XL) 100 MG 24 hr tablet [id number] Yes Take 1 tablet (100 mg total) by mouth daily. Take with or immediately following a meal. Wellington Hampshire, MD Taking Active   Omega-3 Fatty Acids (FISH OIL) 1000 MG CAPS [id number] Yes Take 1 capsule by mouth daily. [provider] Taking Active   Probiotic Product (UP4 PROBIOTICS ADULT) CAPS [id number] Yes 1 po qd  Patient taking  differently: Take 1 capsule by mouth daily.   Carollee Herter, Kendrick Fries R, DO Taking Active   Red Yeast Rice 600 MG TABS [id number] Yes Take 1 tablet by mouth daily. [provider] Taking Active   sodium chloride (MURO 128) 2 % ophthalmic solution [id number] Yes Place 1 drop into both eyes as needed for eye irritation. [provider] Taking Active   timolol (BETIMOL) 0.5 % ophthalmic solution [id number] Yes Place 1 drop into both eyes 2 (two) times daily. [provider] Taking Active             Patient Active Problem List   Diagnosis Date Noted   Hyperlipidemia 04/14/2020   Seasonal allergies 11/27/2019   Atrial fibrillation ([place]) 03/26/2019   Anticoagulated 03/26/2019   Loose stools 04/14/2015   Osteoarthritis 04/14/2015   Chronic flank pain 06/17/2013   Plantar fasciitis 05/25/2013  PAD (peripheral artery disease) ([place]) 03/07/2013   Pain in joint, lower leg 02/05/2013   CARDIAC MURMUR 06/29/2010   VARICOSE VEINS, LOWER EXTREMITIES 06/02/2010   PLANTAR WART, LEFT 04/03/2009   MOLE 04/03/2009   HIP PAIN, RIGHT 04/03/2009   Dyslipidemia, goal LDL below 70 06/29/2007   ARTIFICIAL MENOPAUSE 06/29/2007   Pain in limb 06/29/2007   BREAST BIOPSY, HX OF 06/29/2007   GLAUCOMA, LEFT EYE 04/27/2007   Essential hypertension 04/27/2007   Osteoporosis 04/27/2007    Immunization History  Administered Date(s) Administered   Fluad Quad(high Dose 65+) 10/08/2019, 10/06/2020   Influenza Split 09/15/2011, 09/20/2012   Influenza Whole 11/28/2000, 10/10/2007, 08/20/2008, 09/24/2009, 08/25/2010   Influenza, High Dose Seasonal PF 10/01/2015, 09/29/2016, 09/04/2018   Influenza,inj,Quad PF,6+ Mos 08/15/2013, 10/07/2014   PFIZER Comirnaty(Gray Top)Covid-19 Tri-Sucrose Vaccine 04/15/2021   PFIZER(Purple Top)SARS-COV-2 Vaccination 01/09/2020, 02/16/2020, 09/16/2020   Pneumococcal Conjugate-13 10/07/2014   Pneumococcal Polysaccharide-23 11/28/2000   Td 11/28/2000   Tdap  08/05/2011   Zoster Recombinat (Shingrix) 04/23/2019, 06/22/2019   Zoster, Live 12/28/2007    Conditions to be addressed/monitored: Atrial Fibrillation, HTN, HLD and PAD; osteoarthritis; glaucoma; osteoporosis  Care Plan : General Pharmacy (Adult)  Updates made by Cherre Robins, PHARMD since 08/20/2021 12:00 AM     Problem: Medication and Chronic care management, education and care coordination   Priority: High  Onset Date: 04/16/2021  Note:   Current Barriers:  Unable to achieve control of hyperlipidemia  Chronic Disease Management support, education, and care coordination needs related to Atrial Fib, Hypertension, Hyperlipidemia, Peripheral Artery Disease, Osteoporosis, Glaucoma, Chronic Diarrhea In Medicare coverage gap - cost of Eliquis is high  Pharmacist Clinical Goal(s):  Over the next 180 days, patient will achieve control of lipids as evidenced by LDL <70 contact provider office for questions/concerns as evidenced notation of same in electronic health record through collaboration with PharmD and provider.   Interventions: 1:1 collaboration with Carollee Herter, Alferd Apa, DO regarding development and update of comprehensive plan of care as evidenced by provider attestation and co-signature Inter-disciplinary care team collaboration (see longitudinal plan of care) Comprehensive medication review performed; medication list updated in electronic medical record   Hypertension Currently at goal BP of <140/90 Current regimen:  Amlodipine 48m daily Metoprolol succinate 1038mdaily Interventions: Maintain hypertension medication regimen Discussed blood pressure goals  Hyperlipidemia / PAD Lab Results  Component Value Date/Time   LDLCALC 122 (H) 04/15/2021   LDLCACL 117 10/06/2020  Not at LDL goal; LDL goal < 70 Intolerant to statins Current regimen:  Fish Oil 100072maily Red yeast rice 600m55mily Interventions: Reviewed recent lipid panel results; Discussed LDL goal; She  has great HDL and Tg at goal of <150 Discussed limiting intake of saturated and trans fat Maintain cholesterol medication regimen.  Hold Fish Oil - to see if loose bowel movements improve  Loose Stools Improving;  Having soft stools / not diarrhea but sometimes has to get to bathroom quickly Not really diarrhea but has frequent small, soft stools in the morning (about 4 to 5 times) She has not been back to see GI FOBT negatvie 05/18/2021 Current regimen:  Probiotic daily Fiber powder daily  Interventions:  Maintain bowel medication regimen. Hold Fish Oil to see if loose stools improve Recommended appt with GI for further evaluation of chronic diarrhea - patient to think about it.   Osteoporosis Last DEXA was in 2014;  Lowest T-Score was -2.7 at neck of left hip. Noted no significant change since previous DEXA in 2012.  No history of fractures One fall in the last year with no injury; patient tripped over garden hose in yard; had sore tailbone. Current regimen:  Calcium 688m daily Vitamin D 2000 units daily Interventions: (addressed at previous visit) Continue daily intake of 12059mcalcium through diet and/or supplementation Consider repeat DEXA (noting patient may not want to begin treatment for osteoporosis) Fall assessment completed and discuss fall prevention  Atrial Fibrillation:  Patient denies dizziness or rapid HR She does mention concern about  broken blood vessels around her ankles. She is planning to discuss with cardiologist at upcoming appt. She is concerned it I related to Eliquis CHADS2VASc = 5 Current regimen:  Eliquis 2.23m30mwice a day Metoprolol ER 100m323mily  Currently in Medicare coverage gap. Cost of Eliquis has increased. CMA tried to outreach her a few months ago was CMA was unsuccessful in reaching patient.  Interventions: Continue current regimen Encouraged patient to discuss concerns about Eliquis with her cardiologist but to continue until he  recommends otherwise Financial assessment: She has reached Medicare Coverage Gap. Discussed Eliquis medication assistance program income and OOP (3%) requirements. Patient reports she would have to check her records for income information. Will send application to patient in mail at her request.   Medication management Pharmacist Clinical Goal(s): Over the next 90 days, patient will work with PharmD and providers to maintain optimal medication adherence Current pharmacy: [facility] Interventions Comprehensive medication review performed. Continue current medication management strategy Adherence reviewed;   Patient Goals/Self-Care Activities Over the next 180 days, patient will:  take medications as prescribed, collaborate with provider on medication access solutions, and engage in dietary modifications by limiting intake of fried foods, animal products and saturated and trans fat.  Follow Up Plan: Telephone follow up appointment with care management team member scheduled for:  6 months - clinical pharmacist; CMA in 2 to 4 weeks to check on Eliquis PAP.        Medication Assistance: Application for Eliquis  medication assistance program. in process.  Anticipated assistance start date 09/11/2021.  See plan of care for additional detail.  Patient[place] pharmacy is:  [facility]: [phone]: [phone]: [phone]: [phone] [facility]plant ServicesG [facility] [id number] Mann[address] -[address] [facility] CenterHIGHE[address] [place]ne: [phone]: [phone] Up:  Patient agrees to Care Plan and Follow-up.  Plan: Telephone follow up appointment with care management team member scheduled for:  CMA will  check back on patient assistance application in 2 weeks; f/u with clinical pharmacist in 6 months  TammCherre RobinsarmD Clinical Pharmacist [facility]

## 2021-08-20 NOTE — Patient Instructions (Signed)
Mrs. Plancarte It was a pleasure speaking with you today.   Don't forget to get you annual flu vaccine this season so you are protected. The vaccine is available at [facility] office, downstairs at the [place] or at your preferred local pharmacy.   We are also starting to administer the updated bivalent COVID-19 booster. If your last COVID-19 booster was at least 2 months ago, make sure to get the bivalent COVID-19 booster.   I below is a summary of our visit today and information about your health goals.  If you have any questions or concerns, please feel free to contact me either at the phone number below or with a MyChart message.   Keep up the good work!  Cherre Robins, PharmD Clinical Pharmacist [facility] [phone] (direct line)  [phone] (main office number)   PATIENT GOALS:  Goals Addressed             This Visit's Progress    Chronic Care Management Pharmacy Care Plan       CARE PLAN ENTRY (see longitudinal plan of care for additional care plan information)  Current Barriers:  Chronic Disease Management support, education, and care coordination needs related to AFib, Hypertension, Hyperlipidemia, Peripheral Artery Disease, Osteoporosis, Glaucoma, Chronic Diarrhea   Hypertension BP Readings from Last 3 Encounters:  04/15/21 116/80  10/06/20 130/88  08/18/20 122/76  Pharmacist Clinical Goal(s): Over the next 90 days, patient will work with PharmD and providers to maintain BP goal <140/90 Current regimen:  Amlodipine 5mg  daily Metoprolol succinate 100mg  daily Patient self care activities - Over the next 90 days, patient will: Maintain hypertension medication regimen  Hyperlipidemia Lab Results  Component Value Date/Time   LDLCALC 122 (H) 04/15/2021 01:50 PM   LDLDIRECT 130.5 03/26/2013 09:16 AM  Pharmacist Clinical Goal(s): Over the next 90 days, patient will work with PharmD and providers  to achieve LDL goal < 100 Current regimen:  Fish Oil 1000mg  daily Red yeast rice 600mg  daily Interventions: Discussed LDL goal.  Reviewed most recent lipid panel with patient  Discussed limiting intake of saturated and trans fat Patient self care activities - Over the next 90 days, patient will: Patient to increase intake of vegetables and decrease intake of processed and fried foods and also animal products Continue Red Yeast Rice Hold Fish Oil to see if soft stools decreases  Loose Stools Pharmacist Clinical Goal(s) Over the next 90 days, patient will work with PharmD and providers to reduce symptoms of loose stools Current regimen:  Loperamide 2mg  daily Probiotic daily Fiber powder daily  Patient self care activities - Over the next 90 days, patient will: Maintain bowel medication regimen. Hold Fish oil to see if causing soft / loose stools. Consider seeing gastroenterologist again since diarrhea not improved.   Osteoporosis Pharmacist Clinical Goal(s) Over the next 90 days, patient will work with PharmD and providers to reduce risk of fracture due to osteoporosis Current regimen:  Calcium 600mg  daily Vitamin D 2000 units daily Interventions: Recommend patient to get daily intake of 1200mg  calcium through diet and/or supplementation Consider repeat DEXA Fall assessment completed and discuss fall prevention Patient self care activities - Over the next 90 days, patient will: Get daily intake of 1200mg  calcium through diet and/or supplementation   Atrial Fibrillation:  Pharmacist Clinical Goal(s) Over the next 180 day, Patient will work with PharmD and providers to reduce stroke risk Current regimen:  Eliquis 2.5mg  twice a day Metoprolol ER  100mg  daily  Interventions: Encouraged patient to discuss concerns about Eliquis with her cardiologist but to continue until he recommends otherwise Financial assessment: per patient she is paying $47/month for Eliquis which is  affordable; If she reaches Medicare Coverage Gap she will notify me so we can check on patient assistance eligibility.  Patient self care activities - Over the next 90 days, patient will: Continue current regimen Complete application for Eliquis assistance. Make sure to include a report from your pharmacy or insurance that indicates the total amount that you (and anyone else in your household) has spent on medication from 11/28/2020 until present.  Medication management Pharmacist Clinical Goal(s): Over the next 90 days, patient will work with PharmD and providers to maintain optimal medication adherence Current pharmacy: [facility] Interventions Comprehensive medication review performed. Continue current medication management strategy Reviewed refill history Patient self care activities - Over the next 90 days, patient will: Focus on medication adherence by filling and taking medications appropriately  Take medications as prescribed Report any questions or concerns to PharmD and/or provider(s)  Please see past updates related to this goal by clicking on the "Past Updates" button in the selected goal          The patient verbalized understanding of instructions, educational materials, and care plan provided today and agreed to receive a mailed copy of patient instructions, educational materials, and care plan.   Telephone follow up appointment with care management team member scheduled for: pharmacy assistant will check in with patient regarding Eliquis assistance application in 2 to 3 weeks Follow up call with pharmacist in 6 months, unless needed sooner.

## 2021-08-24 ENCOUNTER — Other Ambulatory Visit: Payer: Self-pay

## 2021-08-24 ENCOUNTER — Ambulatory Visit: Payer: Medicare Other | Admitting: Cardiovascular Disease

## 2021-08-24 ENCOUNTER — Encounter: Payer: Self-pay | Admitting: Cardiovascular Disease

## 2021-08-24 VITALS — BP 138/86 | HR 70 | Ht 62.0 in | Wt 123.0 lb

## 2021-08-24 DIAGNOSIS — I739 Peripheral vascular disease, unspecified: Secondary | ICD-10-CM | POA: Diagnosis not present

## 2021-08-24 DIAGNOSIS — I10 Essential (primary) hypertension: Secondary | ICD-10-CM | POA: Diagnosis not present

## 2021-08-24 DIAGNOSIS — E785 Hyperlipidemia, unspecified: Secondary | ICD-10-CM

## 2021-08-24 DIAGNOSIS — I4821 Permanent atrial fibrillation: Secondary | ICD-10-CM

## 2021-08-24 NOTE — Progress Notes (Signed)
Cardiology Office Note   Date:  08/24/2021   ID:  Evelyn [name], DOB 07/11/1930, MRN [id number]  PCP:  Carollee Herter, Alferd Apa, DO  Cardiologist:   [name], MD   Chief Complaint  Patient presents with   Follow-up    1 year.       History of Present Illness: Evelyn [name] is a 87 y.o. female who presents for a followup visit regarding peripheral arterial disease and atrial fibrillation.   She has chronic medical conditions that include essential hypertension, hyperlipidemia and previous tobacco use.  She is known to have peripheral arterial disease with atypical claudication.  Noninvasive vascular studies done in 2014 showed focal stenosis of the left SFA with mildly reduced ABI. She is intolerant to statins.  She was diagnosed with atrial fibrillation in 2020.  She was started on Xarelto for anticoagulation.  She had an echocardiogram which showed normal LV systolic function, moderately dilated left atrium and no significant valvular abnormalities.  She had hematuria on Xarelto and was switched to Eliquis with subsequent improvement.   She has been doing well with no recent chest pain, shortness of breath or palpitations.  She has minimal leg claudication.  She does have superficial varicose veins.  Past Medical History:  Diagnosis Date   Atrial fibrillation ([place])    Hyperlipidemia    Hypertension    Osteoporosis    Post-menopausal    HRT    Past Surgical History:  Procedure Laterality Date   ABDOMINAL HYSTERECTOMY     BREAST LUMPECTOMY  1975     Current Outpatient Medications  Medication Sig Dispense Refill   amLODipine (NORVASC) 5 MG tablet TAKE 1 TABLET(5 MG) BY MOUTH DAILY 90 tablet 3   apixaban (ELIQUIS) 2.5 MG TABS tablet TAKE 1 TABLET(2.5 MG) BY MOUTH TWICE DAILY 180 tablet 0   calcium carbonate (OS-CAL) 600 MG TABS Take 600 mg by mouth daily.     Cholecalciferol (VITAMIN D PO) Take 1 capsule by mouth daily. 2000 units     Coenzyme Q10 (COQ10) 100 MG  CAPS Take by mouth.     glucosamine-chondroitin 500-400 MG tablet Take 1 tablet by mouth daily.      Latanoprostene Bunod 0.024 % SOLN Apply 1 drop to eye at bedtime.     loperamide (IMODIUM A-D) 2 MG tablet 1 po qd 30 tablet 0   metoprolol succinate (TOPROL-XL) 100 MG 24 hr tablet Take 1 tablet (100 mg total) by mouth daily. Take with or immediately following a meal. 90 tablet 3   Omega-3 Fatty Acids (FISH OIL) 1000 MG CAPS Take 1 capsule by mouth daily.     Probiotic Product (UP4 PROBIOTICS ADULT) CAPS 1 po qd (Patient taking differently: Take 1 capsule by mouth daily.)     Red Yeast Rice 600 MG TABS Take 1 tablet by mouth daily.     sodium chloride (MURO 128) 2 % ophthalmic solution Place 1 drop into both eyes as needed for eye irritation.     timolol (BETIMOL) 0.5 % ophthalmic solution Place 1 drop into both eyes 2 (two) times daily.     No current facility-administered medications for this visit.    Allergies:   Vicodin [hydrocodone-acetaminophen], Pletaal [cilostazol], and Statins    Social History:  The patient  reports that she quit smoking about 48 years ago. Her smoking use included cigarettes. She has a 13.50 pack-year smoking history. She has never used smokeless tobacco. She reports current alcohol use.  She reports that she does not use drugs.   Family History:  The patient's family history includes Arthritis in her mother and sister; COPD in her father and sister; Glaucoma in her father; Heart attack (age of onset: 34) in her father; Heart disease in her mother; Hypertension in her sister; Prostate cancer in her father.      PHYSICAL EXAM: VS:  BP 138/86 (BP Location: Left Arm, Patient Position: Sitting, Cuff Size: Normal)   Pulse 70   Ht 5\' 2"  (1.575 m)   Wt 123 lb (55.8 kg)   BMI 22.50 kg/m  , BMI Body mass index is 22.5 kg/m. GEN: Well nourished, well developed, in no acute distress  HEENT: normal  Neck: no JVD, carotid bruits, or masses Cardiac: Irregularly  irregular; no murmurs, rubs, or gallops,no edema  Respiratory:  clear to auscultation bilaterally, normal work of breathing GI: soft, nontender, nondistended, + BS MS: no deformity or atrophy  Skin: warm and dry, no rash Neuro:  Strength and sensation are intact Psych: euthymic mood, full affect Distal pulses are not palpable.   EKG:  EKG is  ordered today. EKG showed atrial fibrillation with ventricular rate of 70 bpm.  Recent Labs: 10/06/2020: Hemoglobin 14.8; Platelets 221.0 04/15/2021: ALT 25; BUN 10; Creatinine, Ser 0.72; Potassium 4.1; Sodium 140    Lipid Panel    Component Value Date/Time   CHOL 213 (H) 04/15/2021 1350   TRIG 99.0 04/15/2021 1350   HDL 71.20 04/15/2021 1350   CHOLHDL 3 04/15/2021 1350   VLDL 19.8 04/15/2021 1350   LDLCALC 122 (H) 04/15/2021 1350   LDLDIRECT 130.5 03/26/2013 0916      Wt Readings from Last 3 Encounters:  08/24/21 123 lb (55.8 kg)  04/15/21 122 lb 12.8 oz (55.7 kg)  10/06/20 122 lb 9.6 oz (55.6 kg)        ASSESSMENT AND PLAN:  1.  Permanent atrial fibrillation: Chads Vasc score is 5.  She is tolerating anticoagulation with Eliquis.  Rate is well controlled with Toprol 100 mg once daily.  I reviewed her labs done in May which showed normal renal function and negative fecal occult blood.  2. PAD: Currently with minimal claudication.  Continue medical therapy.  2.  Hyperlipidemia: She is intolerant to statins and has been on red yeast Rice.  Most recent lipid profile showed an LDL of 117.  3. Essential Hypertension: Blood pressure is well controlled on current medications.   Disposition:   FU in 12 months.  Signed, [name], MD  08/24/2021 1:16 PM    [facility]

## 2021-08-24 NOTE — Patient Instructions (Signed)

## 2021-08-27 DIAGNOSIS — I4821 Permanent atrial fibrillation: Secondary | ICD-10-CM | POA: Diagnosis not present

## 2021-08-27 DIAGNOSIS — E785 Hyperlipidemia, unspecified: Secondary | ICD-10-CM

## 2021-09-07 DIAGNOSIS — H25812 Combined forms of age-related cataract, left eye: Secondary | ICD-10-CM | POA: Diagnosis not present

## 2021-09-09 ENCOUNTER — Other Ambulatory Visit: Payer: Self-pay | Admitting: Cardiovascular Disease

## 2021-09-09 NOTE — Telephone Encounter (Signed)
Prescription refill request for Eliquis received. Indication: Atrial fib Last office visit: 08/24/21  Rod Can MD Scr: 0.72 on 04/08/21 Age: 87 Weight: 55.8kg  Based on above findings Eliquis 2.5mg  twice daily is the appropriate dose.  Refill approved.

## 2021-09-09 NOTE — Telephone Encounter (Signed)
Please review for refill, thanks ! 

## 2021-09-13 ENCOUNTER — Telehealth: Payer: Self-pay | Admitting: Pharmacist

## 2021-09-13 NOTE — Chronic Care Management (AMB) (Signed)
    Chronic Care Management Pharmacy Assistant   Name: Evelyn [name]  MRN: [id number] DOB: [birth year]  Reason for Encounter: Status on Patient assistance application for Eliquis.   Medications: Outpatient Encounter Medications as of 09/13/2021  Medication Sig   amLODipine (NORVASC) 5 MG tablet TAKE 1 TABLET(5 MG) BY MOUTH DAILY   apixaban (ELIQUIS) 2.5 MG TABS tablet TAKE 1 TABLET(2.5 MG) BY MOUTH TWICE DAILY   calcium carbonate (OS-CAL) 600 MG TABS Take 600 mg by mouth daily.   Cholecalciferol (VITAMIN D PO) Take 1 capsule by mouth daily. 2000 units   Coenzyme Q10 (COQ10) 100 MG CAPS Take by mouth.   glucosamine-chondroitin 500-400 MG tablet Take 1 tablet by mouth daily.    Latanoprostene Bunod 0.024 % SOLN Apply 1 drop to eye at bedtime.   loperamide (IMODIUM A-D) 2 MG tablet 1 po qd   metoprolol succinate (TOPROL-XL) 100 MG 24 hr tablet Take 1 tablet (100 mg total) by mouth daily. Take with or immediately following a meal.   Omega-3 Fatty Acids (FISH OIL) 1000 MG CAPS Take 1 capsule by mouth daily.   Probiotic Product (UP4 PROBIOTICS ADULT) CAPS 1 po qd (Patient taking differently: Take 1 capsule by mouth daily.)   Red Yeast Rice 600 MG TABS Take 1 tablet by mouth daily.   sodium chloride (MURO 128) 2 % ophthalmic solution Place 1 drop into both eyes as needed for eye irritation.   timolol (BETIMOL) 0.5 % ophthalmic solution Place 1 drop into both eyes 2 (two) times daily.   No facility-administered encounter medications on file as of 09/13/2021.    I have contacted [employer] for Eliquis. The application has not been received. I have attempted to reach out to the patient to check the status on the application and the patient has not answered nor returned my phone call. I have attempted three times and left 3 voicemail's.  Corrie Mckusick, [place]

## 2021-09-16 DIAGNOSIS — H25811 Combined forms of age-related cataract, right eye: Secondary | ICD-10-CM | POA: Diagnosis not present

## 2021-09-21 ENCOUNTER — Ambulatory Visit (INDEPENDENT_AMBULATORY_CARE_PROVIDER_SITE_OTHER): Payer: Medicare Other

## 2021-09-21 VITALS — Ht 62.0 in | Wt 123.0 lb

## 2021-09-21 DIAGNOSIS — Z0000 Encounter for general adult medical examination without abnormal findings: Secondary | ICD-10-CM

## 2021-09-21 DIAGNOSIS — Z1231 Encounter for screening mammogram for malignant neoplasm of breast: Secondary | ICD-10-CM | POA: Diagnosis not present

## 2021-09-21 NOTE — Patient Instructions (Signed)
Evelyn [name] , Thank you for taking time to complete your Medicare Wellness Visit. I appreciate your ongoing commitment to your health goals. Please review the following plan we discussed and let me know if I can assist you in the future.   Screening recommendations/referrals: Colonoscopy: No longer required Mammogram: Ordered today. Someone will call you to schedule. Bone Density: Declined today. Please call the office to schedule if you change your mind. Recommended yearly ophthalmology/optometry visit for glaucoma screening and checkup Recommended yearly dental visit for hygiene and checkup  Vaccinations: Influenza vaccine: Due-May obtain vaccine at our office or your local pharmacy. Pneumococcal vaccine: Up to date Tdap vaccine: Discuss with pharmacy Shingles vaccine: Completed vaccines   Covid-19:Booster available at the pharmacy  Advanced directives: Copy in chart  Conditions/risks identified: See problem list  Next appointment: Follow up in one year for your annual wellness visit    Preventive Care 65 Years and Older, Female Preventive care refers to lifestyle choices and visits with your health care provider that can promote health and wellness. What does preventive care include? A yearly physical exam. This is also called an annual well check. Dental exams once or twice a year. Routine eye exams. Ask your health care provider how often you should have your eyes checked. Personal lifestyle choices, including: Daily care of your teeth and gums. Regular physical activity. Eating a healthy diet. Avoiding tobacco and drug use. Limiting alcohol use. Practicing safe sex. Taking low-dose aspirin every day. Taking vitamin and mineral supplements as recommended by your health care provider. What happens during an annual well check? The services and screenings done by your health care provider during your annual well check will depend on your age, overall health, lifestyle risk  factors, and family history of disease. Counseling  Your health care provider may ask you questions about your: Alcohol use. Tobacco use. Drug use. Emotional well-being. Home and relationship well-being. Sexual activity. Eating habits. History of falls. Memory and ability to understand (cognition). Work and work Statistician. Reproductive health. Screening  You may have the following tests or measurements: Height, weight, and BMI. Blood pressure. Lipid and cholesterol levels. These may be checked every 5 years, or more frequently if you are over 36 years old. Skin check. Lung cancer screening. You may have this screening every year starting at age 23 if you have a 30-pack-year history of smoking and currently smoke or have quit within the past 15 years. Fecal occult blood test (FOBT) of the stool. You may have this test every year starting at age 5. Flexible sigmoidoscopy or colonoscopy. You may have a sigmoidoscopy every 5 years or a colonoscopy every 10 years starting at age 34. Hepatitis C blood test. Hepatitis B blood test. Sexually transmitted disease (STD) testing. Diabetes screening. This is done by checking your blood sugar (glucose) after you have not eaten for a while (fasting). You may have this done every 1-3 years. Bone density scan. This is done to screen for osteoporosis. You may have this done starting at age 31. Mammogram. This may be done every 1-2 years. Talk to your health care provider about how often you should have regular mammograms. Talk with your health care provider about your test results, treatment options, and if necessary, the need for more tests. Vaccines  Your health care provider may recommend certain vaccines, such as: Influenza vaccine. This is recommended every year. Tetanus, diphtheria, and acellular pertussis (Tdap, Td) vaccine. You may need a Td booster every 10 years. Zoster vaccine.  You may need this after age 21. Pneumococcal 13-valent  conjugate (PCV13) vaccine. One dose is recommended after age 8. Pneumococcal polysaccharide (PPSV23) vaccine. One dose is recommended after age 45. Talk to your health care provider about which screenings and vaccines you need and how often you need them. This information is not intended to replace advice given to you by your health care provider. Make sure you discuss any questions you have with your health care provider. Document Released: 12/11/2015 Document Revised: 08/03/2016 Document Reviewed: 09/15/2015 Elsevier Interactive Patient Education  2017 [place] Prevention in the Home Falls can cause injuries. They can happen to people of all ages. There are many things you can do to make your home safe and to help prevent falls. What can I do on the outside of my home? Regularly fix the edges of walkways and driveways and fix any cracks. Remove anything that might make you trip as you walk through a door, such as a raised step or threshold. Trim any bushes or trees on the path to your home. Use bright outdoor lighting. Clear any walking paths of anything that might make someone trip, such as rocks or tools. Regularly check to see if handrails are loose or broken. Make sure that both sides of any steps have handrails. Any raised decks and porches should have guardrails on the edges. Have any leaves, snow, or ice cleared regularly. Use sand or salt on walking paths during winter. Clean up any spills in your garage right away. This includes oil or grease spills. What can I do in the bathroom? Use night lights. Install grab bars by the toilet and in the tub and shower. Do not use towel bars as grab bars. Use non-skid mats or decals in the tub or shower. If you need to sit down in the shower, use a plastic, non-slip stool. Keep the floor dry. Clean up any water that spills on the floor as soon as it happens. Remove soap buildup in the tub or shower regularly. Attach bath mats  securely with double-sided non-slip rug tape. Do not have throw rugs and other things on the floor that can make you trip. What can I do in the bedroom? Use night lights. Make sure that you have a light by your bed that is easy to reach. Do not use any sheets or blankets that are too big for your bed. They should not hang down onto the floor. Have a firm chair that has side arms. You can use this for support while you get dressed. Do not have throw rugs and other things on the floor that can make you trip. What can I do in the kitchen? Clean up any spills right away. Avoid walking on wet floors. Keep items that you use a lot in easy-to-reach places. If you need to reach something above you, use a strong step stool that has a grab bar. Keep electrical cords out of the way. Do not use floor polish or wax that makes floors slippery. If you must use wax, use non-skid floor wax. Do not have throw rugs and other things on the floor that can make you trip. What can I do with my stairs? Do not leave any items on the stairs. Make sure that there are handrails on both sides of the stairs and use them. Fix handrails that are broken or loose. Make sure that handrails are as long as the stairways. Check any carpeting to make sure that it is  firmly attached to the stairs. Fix any carpet that is loose or worn. Avoid having throw rugs at the top or bottom of the stairs. If you do have throw rugs, attach them to the floor with carpet tape. Make sure that you have a light switch at the top of the stairs and the bottom of the stairs. If you do not have them, ask someone to add them for you. What else can I do to help prevent falls? Wear shoes that: Do not have high heels. Have rubber bottoms. Are comfortable and fit you well. Are closed at the toe. Do not wear sandals. If you use a stepladder: Make sure that it is fully opened. Do not climb a closed stepladder. Make sure that both sides of the stepladder  are locked into place. Ask someone to hold it for you, if possible. Clearly mark and make sure that you can see: Any grab bars or handrails. First and last steps. Where the edge of each step is. Use tools that help you move around (mobility aids) if they are needed. These include: Canes. Walkers. Scooters. Crutches. Turn on the lights when you go into a dark area. Replace any light bulbs as soon as they burn out. Set up your furniture so you have a clear path. Avoid moving your furniture around. If any of your floors are uneven, fix them. If there are any pets around you, be aware of where they are. Review your medicines with your doctor. Some medicines can make you feel dizzy. This can increase your chance of falling. Ask your doctor what other things that you can do to help prevent falls. This information is not intended to replace advice given to you by your health care provider. Make sure you discuss any questions you have with your health care provider. Document Released: 09/10/2009 Document Revised: 04/21/2016 Document Reviewed: 12/19/2014 Elsevier Interactive Patient Education  2017 [employer].

## 2021-09-21 NOTE — Progress Notes (Signed)
Subjective:   Evelyn [name] is a 87 y.o. female who presents for Medicare Annual (Subsequent) preventive examination.   I connected with [name] today by telephone and verified that I am speaking with the correct person using two identifiers. Location patient: home Location provider: work Persons participating in the virtual visit: patient, Marine scientist.    I discussed the limitations, risks, security and privacy concerns of performing an evaluation and management service by telephone and the availability of in person appointments. I also discussed with the patient that there may be a patient responsible charge related to this service. The patient expressed understanding and verbally consented to this telephonic visit.    Interactive audio and video telecommunications were attempted between this provider and patient, however failed, due to patient having technical difficulties OR patient did not have access to video capability.  We continued and completed visit with audio only.  Some vital signs may be absent or patient reported.   Time Spent with patient on telephone encounter: 25 minutes  Review of Systems     Cardiac Risk Factors include: advanced age (>57men, >65 women);hypertension;dyslipidemia     Objective:    Today's Vitals   09/21/21 0942  Weight: 123 lb (55.8 kg)  Height: 5\' 2"  (1.575 m)   Body mass index is 22.5 kg/m.  Advanced Directives 09/21/2021 03/31/2016 12/29/2015  Does Patient Have a Medical Advance Directive? Yes Yes No  Type of Paramedic of [place];Living will [place];Living will -  Does patient want to make changes to medical advance directive? - No - Patient declined -  Copy of [place] in Chart? No - copy requested No - copy requested -  Would patient like information on creating a medical advance directive? - - No - patient declined information    Current Medications (verified) Outpatient  Encounter Medications as of 09/21/2021  Medication Sig   amLODipine (NORVASC) 5 MG tablet TAKE 1 TABLET(5 MG) BY MOUTH DAILY   apixaban (ELIQUIS) 2.5 MG TABS tablet TAKE 1 TABLET(2.5 MG) BY MOUTH TWICE DAILY   calcium carbonate (OS-CAL) 600 MG TABS Take 600 mg by mouth daily.   Cholecalciferol (VITAMIN D PO) Take 1 capsule by mouth daily. 2000 units   Coenzyme Q10 (COQ10) 100 MG CAPS Take by mouth.   glucosamine-chondroitin 500-400 MG tablet Take 1 tablet by mouth daily.    Latanoprostene Bunod 0.024 % SOLN Apply 1 drop to eye at bedtime.   loperamide (IMODIUM A-D) 2 MG tablet 1 po qd   metoprolol succinate (TOPROL-XL) 100 MG 24 hr tablet Take 1 tablet (100 mg total) by mouth daily. Take with or immediately following a meal.   Omega-3 Fatty Acids (FISH OIL) 1000 MG CAPS Take 1 capsule by mouth daily.   Probiotic Product (UP4 PROBIOTICS ADULT) CAPS 1 po qd (Patient taking differently: Take 1 capsule by mouth daily.)   Red Yeast Rice 600 MG TABS Take 1 tablet by mouth daily.   sodium chloride (MURO 128) 2 % ophthalmic solution Place 1 drop into both eyes as needed for eye irritation.   timolol (BETIMOL) 0.5 % ophthalmic solution Place 1 drop into both eyes 2 (two) times daily.   No facility-administered encounter medications on file as of 09/21/2021.    Allergies (verified) Vicodin [hydrocodone-acetaminophen], Pletaal [cilostazol], and Statins   History: Past Medical History:  Diagnosis Date   Atrial fibrillation ([place])    Hyperlipidemia    Hypertension    Osteoporosis  Post-menopausal    HRT   Past Surgical History:  Procedure Laterality Date   ABDOMINAL HYSTERECTOMY     BREAST LUMPECTOMY  11/28/1973   EYE SURGERY     Cataract removal   Family History  Problem Relation Age of Onset   Arthritis Mother    Heart disease Mother    Heart attack Father 23   Prostate cancer Father    COPD Father    Glaucoma Father    Hypertension Sister    COPD Sister    Arthritis Sister     Social History   Socioeconomic History   Marital status: Widowed    Spouse name: Not on file   Number of children: Not on file   Years of education: Not on file   Highest education level: Not on file  Occupational History   Not on file  Tobacco Use   Smoking status: Former    Packs/day: 0.75    Years: 18.00    Pack years: 13.50    Types: Cigarettes    Quit date: 02/20/1973    Years since quitting: 48.6   Smokeless tobacco: Never  Substance and Sexual Activity   Alcohol use: Yes   Drug use: No   Sexual activity: Yes    Partners: Male  Other Topics Concern   Not on file  Social History Narrative   Exercise--no   Social Determinants of Health   Financial Resource Strain: Low Risk    Difficulty of Paying Living Expenses: Not very hard  Food Insecurity: No Food Insecurity   Worried About Charity fundraiser in the Last Year: Never true   [place] in the Last Year: Never true  Transportation Needs: No Transportation Needs   Lack of Transportation (Medical): No   Lack of Transportation (Non-Medical): No  Physical Activity: Insufficiently Active   Days of Exercise per Week: 3 days   Minutes of Exercise per Session: 10 min  Stress: No Stress Concern Present   Feeling of Stress : Not at all  Social Connections: Moderately Integrated   Frequency of Communication with Friends and Family: More than three times a week   Frequency of Social Gatherings with Friends and Family: Once a week   Attends Religious Services: More than 4 times per year   Active Member of [employer] or Organizations: Yes   Attends Archivist Meetings: More than 4 times per year   Marital Status: Widowed    Tobacco Counseling Counseling given: Not Answered   Clinical Intake:  Pre-visit preparation completed: Yes  Pain : No/denies pain     BMI - recorded: 22.5 Nutritional Status: BMI of 19-24  Normal Nutritional Risks: None Diabetes: No  How often do you need to have someone  help you when you read instructions, pamphlets, or other written materials from your doctor or pharmacy?: 1 - Never  Diabetic?No  Interpreter Needed?: No  Information entered by :: Caroleen Hamman LPN   Activities of Daily Living In your present state of health, do you have any difficulty performing the following activities: 09/21/2021 04/15/2021  Hearing? N Y  Vision? N N  Difficulty concentrating or making decisions? N N  Walking or climbing stairs? N Y  Dressing or bathing? N N  Doing errands, shopping? N N  Preparing Food and eating ? N -  Using the Toilet? N -  In the past six months, have you accidently leaked urine? N -  Do you have problems with loss of  bowel control? N -  Managing your Medications? N -  Managing your Finances? N -  Housekeeping or managing your Housekeeping? N -  Some recent data might be hidden    Patient Care Team: Carollee Herter, Alferd Apa, DO as PCP - General Wellington Hampshire, MD as PCP - Cardiology (Cardiology) Jerline Pain Mingo Amber, DO as Consulting Physician (Osteopathic Medicine) Cherre Robins, PharmD (Pharmacist)  Indicate any recent Medical Services you may have received from other than [facility] providers in the past year (date may be approximate).     Assessment:   This is a routine wellness examination for Evelyn [name].  Hearing/Vision screen Hearing Screening - Comments:: No issues Vision Screening - Comments:: Last eye exam-08/2021-Dr. Jerline Pain Reading glasses  Dietary issues and exercise activities discussed: Current Exercise Habits: Home exercise routine, Type of exercise: Other - see comments (stationary bike), Time (Minutes): 10, Frequency (Times/Week): 3, Weekly Exercise (Minutes/Week): 30, Intensity: Mild, Exercise limited by: None identified   Goals Addressed             This Visit's Progress    Patient Stated       Drinking more water, increase fiber intake & exercise more       Depression Screen PHQ 2/9 Scores 09/21/2021 04/15/2021  10/08/2019 09/04/2018 03/31/2016 11/11/2015 10/07/2014  PHQ - 2 Score 0 0 0 0 0 0 0    Fall Risk Fall Risk  09/21/2021 04/16/2021 10/06/2020 09/04/2018 03/31/2016  Falls in the past year? 0 1 0 No No  Number falls in past yr: 0 0 0 - -  Injury with Fall? 0 0 0 - -  Comment - just a sore tailbone - - -  Risk for fall due to : - History of fall(s);Impaired vision - - -  Follow up Falls prevention discussed Falls prevention discussed Falls evaluation completed - -    FALL RISK PREVENTION PERTAINING TO THE HOME:  Any stairs in or around the home? Yes  If so, are there any without handrails? No  Home free of loose throw rugs in walkways, pet beds, electrical cords, etc? Yes  Adequate lighting in your home to reduce risk of falls? Yes   ASSISTIVE DEVICES UTILIZED TO PREVENT FALLS:  Life alert? No  Use of a cane, walker or w/c? No  Grab bars in the bathroom? Yes  Shower chair or bench in shower? No  Elevated toilet seat or a handicapped toilet? No   TIMED UP AND GO:  Was the test performed? No . Phone visit   Cognitive Function:Normal cognitive status assessed by this Nurse Health Advisor. No abnormalities found.          Immunizations Immunization History  Administered Date(s) Administered   Fluad Quad(high Dose 65+) 10/08/2019, 10/06/2020   Influenza Split 09/15/2011, 09/20/2012   Influenza Whole 11/28/2000, 10/10/2007, 08/20/2008, 09/24/2009, 08/25/2010   Influenza, High Dose Seasonal PF 10/01/2015, 09/29/2016, 09/04/2018   Influenza,inj,Quad PF,6+ Mos 08/15/2013, 10/07/2014   PFIZER Comirnaty(Gray Top)Covid-19 Tri-Sucrose Vaccine 04/15/2021   PFIZER(Purple Top)SARS-COV-2 Vaccination 01/09/2020, 02/16/2020, 09/16/2020   Pneumococcal Conjugate-13 10/07/2014   Pneumococcal Polysaccharide-23 11/28/2000   Td 11/28/2000   Tdap 08/05/2011   Zoster Recombinat (Shingrix) 04/23/2019, 06/22/2019   Zoster, Live 12/28/2007    TDAP status: Due, Education has been provided regarding  the importance of this vaccine. Advised may receive this vaccine at local pharmacy or Health Dept. Aware to provide a copy of the vaccination record if obtained from local pharmacy or Health Dept. Verbalized acceptance and understanding.  Flu Vaccine status: Due, Education has been provided regarding the importance of this vaccine. Advised may receive this vaccine at local pharmacy or Health Dept. Aware to provide a copy of the vaccination record if obtained from local pharmacy or Health Dept. Verbalized acceptance and understanding.  Pneumococcal vaccine status: Up to date  Covid-19 vaccine status: Information provided on how to obtain vaccines.   Qualifies for Shingles Vaccine? No   Zostavax completed Yes   Shingrix Completed?: Yes  Screening Tests Health Maintenance  Topic Date Due   MAMMOGRAM  10/15/2015   COVID-19 Vaccine (5 - Booster for Pfizer series) 06/10/2021   INFLUENZA VACCINE  06/28/2021   TETANUS/TDAP  08/04/2021   URINE MICROALBUMIN  10/06/2021   Pneumonia Vaccine 17+ Years old  Completed   DEXA SCAN  Completed   Zoster Vaccines- Shingrix  Completed   HPV VACCINES  Aged Out    Health Maintenance  Health Maintenance Due  Topic Date Due   MAMMOGRAM  10/15/2015   COVID-19 Vaccine (5 - Booster for Pfizer series) 06/10/2021   INFLUENZA VACCINE  06/28/2021   TETANUS/TDAP  08/04/2021   URINE MICROALBUMIN  10/06/2021    Colorectal cancer screening: No longer required.   Mammogram status: Ordered today. Pt provided with contact info and advised to call to schedule appt.   Bone Density status: Declined  Lung Cancer Screening: (Low Dose CT Chest recommended if Age 45-80 years, 30 pack-year currently smoking OR have quit w/in 15years.) does not qualify.     Additional Screening:  Hepatitis C Screening: does not qualify  Vision Screening: Recommended annual ophthalmology exams for early detection of glaucoma and other disorders of the eye. Is the patient up to  date with their annual eye exam?  Yes  Who is the provider or what is the name of the office in which the patient attends annual eye exams? Dr. Jerline Pain   Dental Screening: Recommended annual dental exams for proper oral hygiene  Community Resource Referral / Chronic Care Management: CRR required this visit?  No   CCM required this visit?  No      Plan:     I have personally reviewed and noted the following in the patient's chart:   Medical and social history Use of alcohol, tobacco or illicit drugs  Current medications and supplements including opioid prescriptions.  Functional ability and status Nutritional status Physical activity Advanced directives List of other physicians Hospitalizations, surgeries, and ER visits in previous 12 months Vitals Screenings to include cognitive, depression, and falls Referrals and appointments  In addition, I have reviewed and discussed with patient certain preventive protocols, quality metrics, and best practice recommendations. A written personalized care plan for preventive services as well as general preventive health recommendations were provided to patient.   Due to this being a telephonic visit, the after visit summary with patients personalized plan was offered to patient via mail or my-chart. Per request, patient was mailed a copy of [place], LPN   [id number]  Nurse health Advisor  Nurse Notes: None

## 2021-09-28 DIAGNOSIS — H25811 Combined forms of age-related cataract, right eye: Secondary | ICD-10-CM | POA: Diagnosis not present

## 2021-10-07 ENCOUNTER — Other Ambulatory Visit (HOSPITAL_BASED_OUTPATIENT_CLINIC_OR_DEPARTMENT_OTHER): Payer: Self-pay

## 2021-10-07 MED ORDER — INFLUENZA VAC A&B SA ADJ QUAD 0.5 ML IM PRSY
PREFILLED_SYRINGE | INTRAMUSCULAR | 0 refills | Status: DC
Start: 2021-10-07 — End: 2021-12-28
  Filled 2021-10-07: qty 0.5, 1d supply, fill #0

## 2021-10-18 ENCOUNTER — Other Ambulatory Visit: Payer: Self-pay | Admitting: Family Medicine

## 2021-10-18 DIAGNOSIS — I10 Essential (primary) hypertension: Secondary | ICD-10-CM

## 2021-10-28 ENCOUNTER — Telehealth: Payer: Self-pay | Admitting: Cardiovascular Disease

## 2021-10-28 NOTE — Telephone Encounter (Signed)
*  STAT* If patient is at the pharmacy, call can be transferred to refill team.   1. Which medications need to be refilled? (please list name of each medication and dose if known) Metoprolol  2. Which pharmacy/location (including street and city if local pharmacy) is medication to be sent to? [place], [place]   3. Do they need a 30 day or 90 day supply? 30 days

## 2021-10-29 ENCOUNTER — Other Ambulatory Visit: Payer: Self-pay | Admitting: Cardiovascular Disease

## 2021-10-29 DIAGNOSIS — I10 Essential (primary) hypertension: Secondary | ICD-10-CM

## 2021-10-29 NOTE — Telephone Encounter (Signed)
Refill request was answered by other means. Called and left a voice message for patient stating that her refill request for Metoprolol Succinate (Toprol-XL) 100 mg daily was sent to the requested pharmacy and to give our office a call if she has any questions.

## 2021-11-01 ENCOUNTER — Other Ambulatory Visit: Payer: Self-pay

## 2021-11-01 ENCOUNTER — Telehealth: Payer: Self-pay | Admitting: Cardiovascular Disease

## 2021-11-01 DIAGNOSIS — I10 Essential (primary) hypertension: Secondary | ICD-10-CM

## 2021-11-01 MED ORDER — METOPROLOL SUCCINATE ER 100 MG PO TB24: ORAL_TABLET | ORAL | 2 refills | Status: DC

## 2021-11-01 MED ORDER — APIXABAN 2.5 MG PO TABS: ORAL_TABLET | ORAL | 1 refills | Status: DC

## 2021-11-01 NOTE — Telephone Encounter (Signed)
Prescription refill request for Eliquis received. Indication:Afib Last office visit:9/22 Scr:0.7 Age: 87 Weight:55.8 kg  Prescription refilled

## 2021-11-01 NOTE — Telephone Encounter (Signed)
Pt c/o medication issue:  1. Name of Medication: apixaban (ELIQUIS) 2.5 MG TABS tablet  metoprolol succinate (TOPROL-XL) 100 MG 24 hr tablet   2. How are you currently taking this medication (dosage and times per day)?  apixaban (ELIQUIS) 2.5 MG TABS tablet TAKE 1 TABLET(2.5 MG) BY MOUTH TWICE DAILY   metoprolol succinate (TOPROL-XL) 100 MG 24 hr tablet TAKE 1 TABLET(100 MG) BY MOUTH DAILY WITH OR IMMEDIATELY FOLLOWING A MEAL    3. Are you having a reaction (difficulty breathing--STAT)? no  4. What is your medication issue? Pt would like to have a 90 ds of metoprolol and a 30ds of eliquis... she called refill in incorrectly previously... please correct for the pt

## 2021-11-01 NOTE — Telephone Encounter (Signed)
Refilled Metoprolol 100 mg daily as directed.   CVD DIV ANTICOAG- please refill Eliquis 2.5 mg. Pt is requesting a 30 day supply of medication.

## 2021-12-28 ENCOUNTER — Ambulatory Visit (INDEPENDENT_AMBULATORY_CARE_PROVIDER_SITE_OTHER): Payer: Medicare Other | Admitting: Pharmacist

## 2021-12-28 ENCOUNTER — Telehealth: Payer: Self-pay | Admitting: Pharmacist

## 2021-12-28 DIAGNOSIS — I4821 Permanent atrial fibrillation: Secondary | ICD-10-CM

## 2021-12-28 DIAGNOSIS — R195 Other fecal abnormalities: Secondary | ICD-10-CM

## 2021-12-28 DIAGNOSIS — I10 Essential (primary) hypertension: Secondary | ICD-10-CM

## 2021-12-28 DIAGNOSIS — E785 Hyperlipidemia, unspecified: Secondary | ICD-10-CM

## 2021-12-28 DIAGNOSIS — I739 Peripheral vascular disease, unspecified: Secondary | ICD-10-CM

## 2021-12-28 NOTE — Patient Instructions (Signed)
Evelyn [name] It was a pleasure speaking with you today.  I have attached a summary of our visit today and information about your health goals.    If you have any questions or concerns, please feel free to contact me either at the phone number below or with a MyChart message.   Keep up the good work!  Cherre Robins, PharmD Clinical Pharmacist [facility] [phone] (direct line)  [phone] (main office number)   CARE PLAN ENTRY (see longitudinal plan of care for additional care plan information)  Current Barriers:  Chronic Disease Management support, education, and care coordination needs related to AFib, Hypertension, Hyperlipidemia, Peripheral Artery Disease, Osteoporosis, Glaucoma, Chronic Diarrhea   Hypertension BP Readings from Last 3 Encounters:  08/24/21 138/86  04/15/21 116/80  10/06/20 130/88   Pharmacist Clinical Goal(s): Over the next 90 days, patient will work with PharmD and providers to maintain BP goal <140/90 Current regimen:  Amlodipine 5mg  daily Metoprolol succinate 100mg  daily Patient self care activities - Over the next 90 days, patient will: Maintain hypertension medication regimen  Hyperlipidemia Lab Results  Component Value Date/Time   LDLCALC 122 (H) 04/15/2021 01:50 PM   LDLDIRECT 130.5 03/26/2013 09:16 AM   Pharmacist Clinical Goal(s): Over the next 90 days, patient will work with PharmD and providers to achieve LDL goal < 100 Current regimen:  Fish Oil 1000mg  daily Red yeast rice 600mg  daily Interventions: Discussed LDL goal.  Reviewed most recent lipid panel with patient  Discussed limiting intake of saturated and trans fat Patient self care activities - Over the next 90 days, patient will: Patient to increase intake of vegetables and decrease intake of processed and fried foods and also animal products Continue Red Yeast Rice Hold Fish Oil to see if soft stools decreases  Loose Stools Pharmacist  Clinical Goal(s) Over the next 90 days, patient will work with PharmD and providers to reduce symptoms of loose stools Current regimen:  Loperamide 2mg  daily Probiotic daily Fiber powder daily  Patient self care activities - Over the next 90 days, patient will: Maintain bowel medication regimen. Hold Fish oil to see if causing soft / loose stools. Consider seeing gastroenterologist again since diarrhea not improved.   Osteoporosis Pharmacist Clinical Goal(s) Over the next 90 days, patient will work with PharmD and providers to reduce risk of fracture due to osteoporosis Current regimen:  Calcium 600mg  daily Vitamin D 2000 units daily Interventions: Recommend patient to get daily intake of 1200mg  calcium through diet and/or supplementation Consider repeat DEXA Fall assessment completed and discuss fall prevention Patient self care activities - Over the next 90 days, patient will: Get daily intake of 1200mg  calcium through diet and/or supplementation   Atrial Fibrillation:  Pharmacist Clinical Goal(s) Over the next 180 day, Patient will work with PharmD and providers to reduce stroke risk Current regimen:  Eliquis 2.5mg  twice a day Metoprolol ER 100mg  daily  Interventions: Financial assessment: per patient she is paying $47/month for Eliquis which is affordable; If she reaches Medicare Coverage Gap she will notify me so we can check on patient assistance eligibility.  Patient self care activities - Over the next 90 days, patient will: Continue current regimen  Medication management Pharmacist Clinical Goal(s): Over the next 90 days, patient will work with PharmD and providers to maintain optimal medication adherence Current pharmacy: [facility] Interventions Comprehensive medication review performed. Continue current medication management strategy Reviewed refill history Coordinated with office staff for follow up appt with PCP. Patient self  care activities - Over the next  90 days, patient will: Focus on medication adherence by filling and taking medications appropriately  Take medications as prescribed Report any questions or concerns to PharmD and/or provider(s)   Patient verbalizes understanding of instructions and care plan provided today and agrees to view in [place]. Active MyChart status confirmed with patient.

## 2021-12-28 NOTE — Telephone Encounter (Signed)
Had a follow up Chronic Care Management visit with patient today. She mentioned needing an appointment with Dr Carollee Herter for physical or follow up. Last physical was 09/2020.  Forwarding request to scheduler to reach out to patient.

## 2021-12-28 NOTE — Chronic Care Management (AMB) (Signed)
Chronic Care Management Pharmacy Note  12/28/2021 Name:  Evelyn [name]  Subjective: Evelyn [name] is an 88 y.o. year old female who is a primary patient of Ann Held, DO.  The CCM team was consulted for assistance with disease management and care coordination needs.    Engaged with patient by telephone for follow up visit in response to provider referral for pharmacy case management and/or care coordination services.   Consent to Services:  The patient was given information about Chronic Care Management services, agreed to services, and gave verbal consent prior to initiation of services.  Please see initial visit note for detailed documentation.   Patient Care Team: Carollee Herter, Alferd Apa, DO as PCP - General Wellington Hampshire, MD as PCP - Cardiology (Cardiology) Jerline Pain Mingo Amber, DO as Consulting Physician (Osteopathic Medicine) Cherre Robins, [place] (Pharmacist)  Recent office visits: 09/21/2021 - annual wellness visit 04/15/2021 - PCP (Dr Darletta Moll health visit; no medication changes; Received 4th COVID vaccine.  Recent consult visits: 08/24/2021 - Cardio (Dr Fletcher Anon). Seen for HTN, PAD and permenant a fib. No med changes noted.  04/15/2021 - Dermatology (Dr Rozann Lesches) seen for actinic keratosis and seborrheic keratosis.  Hospital visits: None in previous 6 months  Objective:  Lab Results  Component Value Date   CREATININE 0.72 04/15/2021   CREATININE 0.65 10/08/2019   CREATININE 0.80 07/23/2019    No results found for: HGBA1C Last diabetic Eye exam: No results found for: HMDIABEYEEXA  Last diabetic Foot exam: No results found for: HMDIABFOOTEX      Component Value Date/Time   CHOL 213 (H) 04/15/2021 1350   TRIG 99.0 04/15/2021 1350   HDL 71.20 04/15/2021 1350   CHOLHDL 3 04/15/2021 1350   VLDL 19.8 04/15/2021 1350   LDLCALC 122 (H) 04/15/2021 1350   LDLDIRECT 130.5 03/26/2013 0916    Hepatic Function  Latest Ref Rng & Units 04/15/2021 10/08/2019 09/04/2018  Total Protein 6.0 - 8.3 g/dL 7.9 7.8 8.1  Albumin 3.5 - 5.2 g/dL 4.4 4.3 4.3  AST 0 - 37 U/L '26 22 19  ' ALT 0 - 35 U/L '25 19 22  ' Alk Phosphatase 39 - 117 U/L 67 62 66  Total Bilirubin 0.2 - 1.2 mg/dL 1.0 0.9 0.7  Bilirubin, Direct 0.0 - 0.3 mg/dL - - -    Lab Results  Component Value Date/Time   TSH 3.12 03/12/2020 03:20 PM   TSH 2.85 01/28/2013 10:58 AM    CBC Latest Ref Rng & Units 10/06/2020 05/21/2020 03/12/2020  WBC 4.0 - 10.5 K/uL 6.8 6.9 7.5  Hemoglobin 12.0 - 15.0 g/dL 14.8 13.8 14.4  Hematocrit 36.0 - 46.0 % 44.5 40.9 42.0  Platelets 150.0 - 400.0 K/uL 221.0 188.0 190.0    Lab Results  Component Value Date/Time   VD25OH 61 06/29/2010 12:00 AM   VD25OH 54 03/25/2009 08:44 PM    Clinical ASCVD: Yes  - PAD The ASCVD Risk score (Arnett DK, et al., 2019) failed to calculate for the following reasons:   The 2019 ASCVD risk score is only valid for ages 26 to 28    Other: CHADS2VASc = 5  Social History   Tobacco Use  Smoking Status Former   Packs/day: 0.75   Years: 18.00   Pack years: 13.50   Types: Cigarettes   Quit date: 02/20/1973   Years since quitting: 48.8  Smokeless Tobacco Never   BP Readings from Last 3 Encounters:  08/24/21 138/86  04/15/21 116/80  10/06/20 130/88   Pulse Readings from Last 3 Encounters:  08/24/21 70  04/15/21 90  10/06/20 82   Wt Readings from Last 3 Encounters:  09/21/21 123 lb (55.8 kg)  08/24/21 123 lb (55.8 kg)  04/15/21 122 lb 12.8 oz (55.7 kg)    Assessment: Review of patient past medical history, allergies, medications, health status, including review of consultants reports, laboratory and other test data, was performed as part of comprehensive evaluation and provision of chronic care management services.   SDOH:  (Social Determinants of Health) assessments and interventions performed:  SDOH Interventions    Flowsheet Row Most Recent Value  SDOH Interventions    Financial Strain Interventions Other (Comment)  [patient did not complete PAP forms for 2022 but will follow up for 2023 and once reaches OOP spend will offer to assists with applying for PAP if patient is interested.]       CCM Care Plan  Allergies  Allergen Reactions   Vicodin [Hydrocodone-Acetaminophen] Other (See Comments)    Abdominal Pain   Pletaal [Cilostazol] Diarrhea   Statins     Medications Reviewed Today     Reviewed by Cherre Robins, RPH-CPP (Pharmacist) on 12/28/21 at 1140  Med List Status: <None>   Medication Order Taking? Sig Documenting Provider Last Dose Status Informant  amLODipine (NORVASC) 5 MG tablet [id number] Yes TAKE 1 TABLET(5 MG) BY MOUTH DAILY Carollee Herter, Alferd Apa, DO Taking Active   apixaban (ELIQUIS) 2.5 MG TABS tablet [id number] Yes TAKE 1 TABLET(2.5 MG) BY MOUTH TWICE DAILY Wellington Hampshire, MD Taking Active   calcium carbonate (OS-CAL) 600 MG TABS [id number] Yes Take 600 mg by mouth daily. [provider] Taking Active   Cholecalciferol (VITAMIN D PO) [id number] Yes Take 1 capsule by mouth daily. 2000 units [provider] Taking Active   Coenzyme Q10 (COQ10) 100 MG CAPS [id number] No Take by mouth.  Patient not taking: Reported on 12/28/2021   [provider] Not Taking Active Self  glucosamine-chondroitin 500-400 MG tablet [id number] Yes Take 1 tablet by mouth daily.  [provider] Taking Active   Latanoprostene Bunod 0.024 % SOLN [id number] Yes Apply 1 drop to eye at bedtime. [provider] Taking Active   loperamide (IMODIUM A-D) 2 MG tablet [id number] Yes 1 po qd Carollee Herter, Yvonne R, DO Taking Active   LOTEMAX SM 0.38 % GEL [id number]   [provider]  Active   metoprolol succinate (TOPROL-XL) 100 MG 24 hr tablet [id number] Yes TAKE 1 TABLET(100 MG) BY MOUTH DAILY WITH OR IMMEDIATELY FOLLOWING A MEAL Arida, Mertie Clause, MD Taking Active   Omega-3 Fatty Acids (FISH OIL) 1000 MG CAPS [id number] Yes  Take 1 capsule by mouth daily. [provider] Taking Active   Probiotic Product (UP4 PROBIOTICS ADULT) CAPS [id number] No 1 po qd  Patient not taking: Reported on 12/28/2021   Ann Held, DO Not Taking Active   Red Yeast Rice 600 MG TABS [id number] Yes Take 1 tablet by mouth daily. [provider] Taking Active   sodium chloride (MURO 128) 2 % ophthalmic solution [id number] Yes Place 1 drop into both eyes as needed for eye irritation. [provider] Taking Active   timolol (TIMOPTIC) 0.5 % ophthalmic solution [id number] Yes 1 drop every morning. [provider] Taking Active             Patient Active Problem List   Diagnosis Date Noted   Hyperlipidemia 04/14/2020  Seasonal allergies 11/27/2019   Atrial fibrillation ([place]) 03/26/2019   Anticoagulated 03/26/2019   Loose stools 04/14/2015   Osteoarthritis 04/14/2015   Chronic flank pain 06/17/2013   Plantar fasciitis 05/25/2013   PAD (peripheral artery disease) ([place]) 03/07/2013   Pain in joint, lower leg 02/05/2013   CARDIAC MURMUR 06/29/2010   VARICOSE VEINS, LOWER EXTREMITIES 06/02/2010   PLANTAR WART, LEFT 04/03/2009   MOLE 04/03/2009   HIP PAIN, RIGHT 04/03/2009   Dyslipidemia, goal LDL below 70 06/29/2007   ARTIFICIAL MENOPAUSE 06/29/2007   Pain in limb 06/29/2007   BREAST BIOPSY, HX OF 06/29/2007   GLAUCOMA, LEFT EYE 04/27/2007   Essential hypertension 04/27/2007   Osteoporosis 04/27/2007    Immunization History  Administered Date(s) Administered   Fluad Quad(high Dose 65+) 10/08/2019, 10/06/2020, 10/07/2021   Influenza Split 09/15/2011, 09/20/2012   Influenza Whole 11/28/2000, 10/10/2007, 08/20/2008, 09/24/2009, 08/25/2010   Influenza, High Dose Seasonal PF 10/01/2015, 09/29/2016, 09/04/2018   Influenza,inj,Quad PF,6+ Mos 08/15/2013, 10/07/2014   PFIZER Comirnaty(Gray Top)Covid-19 Tri-Sucrose Vaccine 04/15/2021   PFIZER(Purple Top)SARS-COV-2 Vaccination 01/09/2020,  02/16/2020, 09/16/2020   Pneumococcal Conjugate-13 10/07/2014   Pneumococcal Polysaccharide-23 11/28/2000   Td 11/28/2000   Tdap 08/05/2011   Zoster Recombinat (Shingrix) 04/23/2019, 06/22/2019   Zoster, Live 12/28/2007    Conditions to be addressed/monitored: Atrial Fibrillation, HTN, HLD and PAD; osteoarthritis; glaucoma; osteoporosis  Care Plan : General Pharmacy (Adult)  Updates made by Cherre Robins, RPH-CPP since 12/28/2021 12:00 AM     Problem: Medication and Chronic care management, education and care coordination   Priority: High  Onset Date: 04/16/2021  Note:   Current Barriers:  Unable to achieve control of hyperlipidemia  Chronic Disease Management support, education, and care coordination needs related to Atrial Fib, Hypertension, Hyperlipidemia, Peripheral Artery Disease, Osteoporosis, Glaucoma, Chronic Diarrhea Usually reaches Medicare coverage gap - cost of Eliquis increases significantly  Pharmacist Clinical Goal(s):  Over the next 180 days, patient will achieve control of lipids as evidenced by LDL <70 contact provider office for questions/concerns as evidenced notation of same in electronic health record through collaboration with PharmD and provider.   Interventions: 1:1 collaboration with Carollee Herter, Alferd Apa, DO regarding development and update of comprehensive plan of care as evidenced by provider attestation and co-signature Inter-disciplinary care team collaboration (see longitudinal plan of care) Comprehensive medication review performed; medication list updated in electronic medical record   Hypertension Currently at goal BP of <140/90 Current regimen:  Amlodipine 73m daily Metoprolol succinate 1086mdaily Interventions: Maintain hypertension medication regimen Discussed blood pressure goals  Hyperlipidemia / PAD Lab Results  Component Value Date/Time   LDLCALC 122 (H) 04/15/2021   LDLCACL 117 10/06/2020  Not at LDL goal; LDL goal <  70 Intolerant to statins Current regimen:  Fish Oil 100047maily Red yeast rice 600m5mily Interventions: Reviewed recent lipid panel results; Discussed LDL goal; She has great HDL and Tg at goal of <150 Discussed limiting intake of saturated and trans fat Maintain cholesterol medication regimen.  Hold Fish Oil - to see if loose bowel movements improve  Loose Stools Improving; Not really diarrhea but has frequent small, soft stools in the morning (about 4 to 5 times) I had recommended she try holding fish oil to see if loose stool improved at last visit but patient forgot. She has not been back to see GI FOBT negatvie 05/18/2021 Current regimen:  Probiotic daily Fiber powder daily  Interventions:  Maintain bowel medication regimen. Again recommended she hold Fish Oil to see if loose  stools improve Recommended appt with GI for further evaluation of chronic diarrhea  Osteoporosis Last DEXA was in 2014;  Lowest T-Score was -2.7 at neck of left hip. Noted no significant change since previous DEXA in 2012. No history of fractures One fall in the last year with no injury; patient tripped over garden hose in yard; had sore tailbone. Current regimen:  Calcium 649m daily Vitamin D 2000 units daily Interventions: (addressed at previous visit) Continue daily intake of 12011mcalcium through diet and/or supplementation Consider repeat DEXA (noting patient may not want to begin treatment for osteoporosis) Fall assessment completed and discuss fall prevention  Atrial Fibrillation:  Patient denies dizziness or rapid HR CHADS2VASc = 5 Current regimen:  Eliquis 2.22m37mwice a day Metoprolol ER 100m17mily   Interventions: Continue current regimen Financial assessment: She usually reaches Medicare Coverage Gap in Fall each year. In 2022 she declined to complete patient assistance program for Eliquis. Will continue to follow up in 2023 and assist if patient is open to medication  assistance program once OOP (3%) requirement is met.   Medication management Pharmacist Clinical Goal(s): Over the next 90 days, patient will work with PharmD and providers to maintain optimal medication adherence Current pharmacy: [facility] Interventions Comprehensive medication review performed. Continue current medication management strategy Adherence reviewed;  Coordinated with office staff for follow up appt with PCP.  Patient Goals/Self-Care Activities Over the next 180 days, patient will:  take medications as prescribed, collaborate with provider on medication access solutions, and engage in dietary modifications by limiting intake of fried foods, animal products and saturated and trans fat.  Follow Up Plan: Telephone follow up appointment with care management team member scheduled for:  4 months       Medication Assistance:  Not eligible to apply for patient assistance program for Eliquis until reaches coverage gap AND spends 3% of income out of pocket. Last year patient decided not to apply. Will follow for 2023.   Patient's [facility] is:  [facility]: [phone]: [phone] [address] [address] [place]: [phone]: [id number] [facility]: [phone]: [phone] Up:  Patient agrees to Care Plan and Follow-up.  Plan: Telephone follow up appointment with care management team member scheduled for:  f/u with clinical pharmacist in 4 to 6 months  TammCherre RobinsarmD Clinical Pharmacist [facility]

## 2022-02-01 ENCOUNTER — Ambulatory Visit (INDEPENDENT_AMBULATORY_CARE_PROVIDER_SITE_OTHER): Payer: Medicare Other | Admitting: Family Medicine

## 2022-02-01 ENCOUNTER — Encounter: Payer: Self-pay | Admitting: Family Medicine

## 2022-02-01 VITALS — BP 140/90 | HR 87 | Temp 98.5°F | Resp 18 | Ht 62.0 in | Wt 124.2 lb

## 2022-02-01 DIAGNOSIS — R198 Other specified symptoms and signs involving the digestive system and abdomen: Secondary | ICD-10-CM | POA: Insufficient documentation

## 2022-02-01 DIAGNOSIS — E785 Hyperlipidemia, unspecified: Secondary | ICD-10-CM | POA: Diagnosis not present

## 2022-02-01 DIAGNOSIS — I4821 Permanent atrial fibrillation: Secondary | ICD-10-CM | POA: Diagnosis not present

## 2022-02-01 DIAGNOSIS — I739 Peripheral vascular disease, unspecified: Secondary | ICD-10-CM | POA: Diagnosis not present

## 2022-02-01 DIAGNOSIS — I10 Essential (primary) hypertension: Secondary | ICD-10-CM | POA: Diagnosis not present

## 2022-02-01 LAB — CBC WITH DIFFERENTIAL/PLATELET
Basophils Absolute: 0 10*3/uL (ref 0.0–0.1)
Basophils Relative: 0.7 % (ref 0.0–3.0)
Eosinophils Absolute: 0.1 10*3/uL (ref 0.0–0.7)
Eosinophils Relative: 1.4 % (ref 0.0–5.0)
HCT: 42.2 % (ref 36.0–46.0)
Hemoglobin: 14 g/dL (ref 12.0–15.0)
Lymphocytes Relative: 38.2 % (ref 12.0–46.0)
Lymphs Abs: 2.5 10*3/uL (ref 0.7–4.0)
MCHC: 33.3 g/dL (ref 30.0–36.0)
MCV: 94.2 fl (ref 78.0–100.0)
Monocytes Absolute: 0.9 10*3/uL (ref 0.1–1.0)
Monocytes Relative: 12.9 % — ABNORMAL HIGH (ref 3.0–12.0)
Neutro Abs: 3.1 10*3/uL (ref 1.4–7.7)
Neutrophils Relative %: 46.8 % (ref 43.0–77.0)
Platelets: 175 10*3/uL (ref 150.0–400.0)
RBC: 4.48 Mil/uL (ref 3.87–5.11)
RDW: 13.8 % (ref 11.5–15.5)
WBC: 6.6 10*3/uL (ref 4.0–10.5)

## 2022-02-01 LAB — LIPID PANEL
Cholesterol: 208 mg/dL — ABNORMAL HIGH (ref 0–200)
HDL: 65.9 mg/dL (ref >39.00)
LDL Cholesterol: 119 mg/dL — ABNORMAL HIGH (ref 0–99)
NonHDL: 141.86
Total CHOL/HDL Ratio: 3
Triglycerides: 113 mg/dL (ref 0.0–149.0)
VLDL: 22.6 mg/dL (ref 0.0–40.0)

## 2022-02-01 LAB — COMPREHENSIVE METABOLIC PANEL
ALT: 16 U/L (ref 0–35)
AST: 20 U/L (ref 0–37)
Albumin: 4.3 g/dL (ref 3.5–5.2)
Alkaline Phosphatase: 66 U/L (ref 39–117)
BUN: 14 mg/dL (ref 6–23)
CO2: 31 mEq/L (ref 19–32)
Calcium: 9.9 mg/dL (ref 8.4–10.5)
Chloride: 98 mEq/L (ref 96–112)
Creatinine, Ser: 0.73 mg/dL (ref 0.40–1.20)
GFR: 72.58 mL/min (ref >60.00)
Glucose, Bld: 86 mg/dL (ref 70–99)
Potassium: 4.6 mEq/L (ref 3.5–5.1)
Sodium: 138 mEq/L (ref 135–145)
Total Bilirubin: 1 mg/dL (ref 0.2–1.2)
Total Protein: 8 g/dL (ref 6.0–8.3)

## 2022-02-01 LAB — TSH: TSH: 1.99 u[IU]/mL (ref 0.35–5.50)

## 2022-02-01 NOTE — Patient Instructions (Signed)
Preventive Care 34 Years and Older, Female ?Preventive care refers to lifestyle choices and visits with your health care provider that can promote health and wellness. Preventive care visits are also called wellness exams. ?What can I expect for my preventive care visit? ?Counseling ?Your health care provider may ask you questions about your: ?Medical history, including: ?Past medical problems. ?Family medical history. ?Pregnancy and menstrual history. ?History of falls. ?Current health, including: ?Memory and ability to understand (cognition). ?Emotional well-being. ?Home life and relationship well-being. ?Sexual activity and sexual health. ?Lifestyle, including: ?Alcohol, nicotine or tobacco, and drug use. ?Access to firearms. ?Diet, exercise, and sleep habits. ?Work and work Statistician. ?Sunscreen use. ?Safety issues such as seatbelt and bike helmet use. ?Physical exam ?Your health care provider will check your: ?Height and weight. These may be used to calculate your BMI (body mass index). BMI is a measurement that tells if you are at a healthy weight. ?Waist circumference. This measures the distance around your waistline. This measurement also tells if you are at a healthy weight and may help predict your risk of certain diseases, such as type 2 diabetes and high blood pressure. ?Heart rate and blood pressure. ?Body temperature. ?Skin for abnormal spots. ?What immunizations do I need? ?Vaccines are usually given at various ages, according to a schedule. Your health care provider will recommend vaccines for you based on your age, medical history, and lifestyle or other factors, such as travel or where you work. ?What tests do I need? ?Screening ?Your health care provider may recommend screening tests for certain conditions. This may include: ?Lipid and cholesterol levels. ?Hepatitis C test. ?Hepatitis B test. ?HIV (human immunodeficiency virus) test. ?STI (sexually transmitted infection) testing, if you are at  risk. ?Lung cancer screening. ?Colorectal cancer screening. ?Diabetes screening. This is done by checking your blood sugar (glucose) after you have not eaten for a while (fasting). ?Mammogram. Talk with your health care provider about how often you should have regular mammograms. ?BRCA-related cancer screening. This may be done if you have a family history of breast, ovarian, tubal, or peritoneal cancers. ?Bone density scan. This is done to screen for osteoporosis. ?Talk with your health care provider about your test results, treatment options, and if necessary, the need for more tests. ?Follow these instructions at home: ?Eating and drinking ? ?Eat a diet that includes fresh fruits and vegetables, whole grains, lean protein, and low-fat dairy products. Limit your intake of foods with high amounts of sugar, saturated fats, and salt. ?Take vitamin and mineral supplements as recommended by your health care provider. ?Do not drink alcohol if your health care provider tells you not to drink. ?If you drink alcohol: ?Limit how much you have to 0-1 drink a day. ?Know how much alcohol is in your drink. In the [place] one drink equals one 12 oz bottle of beer (355 mL), one 5 oz glass of wine (148 mL), or one 1? oz glass of hard liquor (44 mL). ?Lifestyle ?Brush your teeth every morning and night with fluoride toothpaste. Floss one time each day. ?Exercise for at least 30 minutes 5 or more days each week. ?Do not use any products that contain nicotine or tobacco. These products include cigarettes, chewing tobacco, and vaping devices, such as e-cigarettes. If you need help quitting, ask your health care provider. ?Do not use drugs. ?If you are sexually active, practice safe sex. Use a condom or other form of protection in order to prevent STIs. ?Take aspirin only as told by your  health care provider. Make sure that you understand how much to take and what form to take. Work with your health care provider to find out whether it  is safe and beneficial for you to take aspirin daily. ?Ask your health care provider if you need to take a cholesterol-lowering medicine (statin). ?Find healthy ways to manage stress, such as: ?Meditation, yoga, or listening to music. ?Journaling. ?Talking to a trusted person. ?Spending time with friends and family. ?Minimize exposure to UV radiation to reduce your risk of skin cancer. ?Safety ?Always wear your seat belt while driving or riding in a vehicle. ?Do not drive: ?If you have been drinking alcohol. Do not ride with someone who has been drinking. ?When you are tired or distracted. ?While texting. ?If you have been using any mind-altering substances or drugs. ?Wear a helmet and other protective equipment during sports activities. ?If you have firearms in your house, make sure you follow all gun safety procedures. ?What's next? ?Visit your health care provider once a year for an annual wellness visit. ?Ask your health care provider how often you should have your eyes and teeth checked. ?Stay up to date on all vaccines. ?This information is not intended to replace advice given to you by your health care provider. Make sure you discuss any questions you have with your health care provider. ?Document Revised: 05/12/2021 Document Reviewed: 05/12/2021 ?Elsevier Patient Education ? [place]. ? ?

## 2022-02-01 NOTE — Assessment & Plan Note (Signed)
Well controlled, no changes to meds. Encouraged heart healthy diet such as the DASH diet and exercise as tolerated.  °

## 2022-02-01 NOTE — Assessment & Plan Note (Signed)
On eliquis ?Per cardiology ?

## 2022-02-01 NOTE — Assessment & Plan Note (Signed)
Pt encouraged to f/u GI ?con't probiotics and fiber  ?

## 2022-02-01 NOTE — Assessment & Plan Note (Signed)
Encourage heart healthy diet such as MIND or DASH diet, increase exercise, avoid trans fats, simple carbohydrates and processed foods, consider a krill or fish or flaxseed oil cap daily.  °

## 2022-02-01 NOTE — Progress Notes (Addendum)
Subjective:     Evelyn [name] is a 88 y.o. female and is here for a comprehensive physical exam. The patient reports no problems.  Social History   Socioeconomic History   Marital status: Widowed    Spouse name: Not on file   Number of children: Not on file   Years of education: Not on file   Highest education level: Not on file  Occupational History   Not on file  Tobacco Use   Smoking status: Former    Packs/day: 0.75    Years: 18.00    Pack years: 13.50    Types: Cigarettes    Quit date: 02/20/1973    Years since quitting: 48.9   Smokeless tobacco: Never  Substance and Sexual Activity   Alcohol use: Yes   Drug use: No   Sexual activity: Yes    Partners: Male  Other Topics Concern   Not on file  Social History Narrative   Exercise--yard work, house work   Investment banker, operational of Radio broadcast assistant Strain: Low Risk    Difficulty of Paying Living Expenses: Not very hard  Food Insecurity: No Food Insecurity   Worried About Charity fundraiser in the Last Year: Never true   Arboriculturist in the Last Year: Never true  Transportation Needs: No Transportation Needs   Lack of Transportation (Medical): No   Lack of Transportation (Non-Medical): No  Physical Activity: Insufficiently Active   Days of Exercise per Week: 3 days   Minutes of Exercise per Session: 10 min  Stress: No Stress Concern Present   Feeling of Stress : Not at all  Social Connections: Moderately Integrated   Frequency of Communication with Friends and Family: More than three times a week   Frequency of Social Gatherings with Friends and Family: Once a week   Attends Religious Services: More than 4 times per year   Active Member of [employer] or Organizations: Yes   Attends Archivist Meetings: More than 4 times per year   Marital Status: Widowed  Intimate Partner Violence: Not At Risk   Fear of Current or Ex-Partner: No   Emotionally Abused: No   Physically Abused: No   Sexually  Abused: No   Health Maintenance  Topic Date Due   MAMMOGRAM  10/15/2015   TETANUS/TDAP  08/04/2021   URINE MICROALBUMIN  10/06/2021   COVID-19 Vaccine (5 - Booster for Pfizer series) 02/17/2022 (Originally 06/10/2021)   Pneumonia Vaccine 61+ Years old  Completed   INFLUENZA VACCINE  Completed   DEXA SCAN  Completed   Zoster Vaccines- Shingrix  Completed   HPV VACCINES  Aged Out    The following portions of the patient's history were reviewed and updated as appropriate: She  has a past medical history of Atrial fibrillation ([place]), Hyperlipidemia, Hypertension, Osteoporosis, and Post-menopausal. She does not have any pertinent problems on file. She  has a past surgical history that includes Abdominal hysterectomy; Breast lumpectomy (11/28/1973); and Eye surgery. Her family history includes Arthritis in her mother and sister; COPD in her father and sister; Glaucoma in her father; Heart attack (age of onset: 78) in her father; Heart disease in her mother; Hypertension in her sister; Prostate cancer in her father. She  reports that she quit smoking about 48 years ago. Her smoking use included cigarettes. She has a 13.50 pack-year smoking history. She has never used smokeless tobacco. She reports current alcohol use. She reports that she does not use  drugs. She has a current medication list which includes the following prescription(s): amlodipine, apixaban, calcium carbonate, vitamin d, glucosamine-chondroitin, latanoprostene bunod, loperamide, lotemax sm, metoprolol succinate, moxifloxacin, red yeast rice, sodium chloride, and timolol. Current Outpatient Medications on File Prior to Visit  Medication Sig Dispense Refill   amLODipine (NORVASC) 5 MG tablet TAKE 1 TABLET(5 MG) BY MOUTH DAILY 90 tablet 3   apixaban (ELIQUIS) 2.5 MG TABS tablet TAKE 1 TABLET(2.5 MG) BY MOUTH TWICE DAILY 180 tablet 1   calcium carbonate (OS-CAL) 600 MG TABS Take 600 mg by mouth daily.     Cholecalciferol (VITAMIN D PO)  Take 1 capsule by mouth daily. 2000 units     glucosamine-chondroitin 500-400 MG tablet Take 1 tablet by mouth daily.      Latanoprostene Bunod 0.024 % SOLN Apply 1 drop to eye at bedtime.     loperamide (IMODIUM A-D) 2 MG tablet 1 po qd 30 tablet 0   LOTEMAX SM 0.38 % GEL      metoprolol succinate (TOPROL-XL) 100 MG 24 hr tablet TAKE 1 TABLET(100 MG) BY MOUTH DAILY WITH OR IMMEDIATELY FOLLOWING A MEAL 90 tablet 2   moxifloxacin (VIGAMOX) 0.5 % ophthalmic solution Place into the right eye.     Red Yeast Rice 600 MG TABS Take 1 tablet by mouth daily.     sodium chloride (MURO 128) 2 % ophthalmic solution Place 1 drop into both eyes as needed for eye irritation.     timolol (TIMOPTIC) 0.5 % ophthalmic solution 1 drop every morning.     No current facility-administered medications on file prior to visit.   She is allergic to vicodin [hydrocodone-acetaminophen], pletaal [cilostazol], and statins..  Review of Systems Review of Systems  Constitutional: Negative for activity change, appetite change and fatigue.  HENT: Negative for hearing loss, congestion, tinnitus and ear discharge.  dentist q72mEyes: Negative for visual disturbance (see optho q1y -- vision corrected to 20/20 with glasses).  Respiratory: Negative for cough, chest tightness and shortness of breath.   Cardiovascular: Negative for chest pain, palpitations and leg swelling.  Gastrointestinal: Negative for abdominal pain, diarrhea, constipation and abdominal distention.  Genitourinary: Negative for urgency, frequency, decreased urine volume and difficulty urinating.  Musculoskeletal: Negative for back pain, arthralgias and gait problem.  Skin: Negative for color change, pallor and rash.  Neurological: Negative for dizziness, light-headedness, numbness and headaches.  Hematological: Negative for adenopathy. Does not bruise/bleed easily.  Psychiatric/Behavioral: Negative for suicidal ideas, confusion, sleep disturbance, self-injury,  dysphoric mood, decreased concentration and agitation.      Objective:    BP 140/90 (BP Location: Right Arm, Patient Position: Sitting, Cuff Size: Normal)    Pulse 87    Temp 98.5 F (36.9 C) (Oral)    Resp 18    Ht '5\' 2"'$  (1.575 m)    Wt 124 lb 3.2 oz (56.3 kg)    SpO2 97%    BMI 22.72 kg/m  General appearance: alert, cooperative, appears stated age, and no distress Head: Normocephalic, without obvious abnormality, atraumatic Eyes: negative findings: lids and lashes normal, conjunctivae and sclerae normal, and pupils equal, round, reactive to light and accomodation Ears: normal TM's and external ear canals both ears Nose: Nares normal. Septum midline. Mucosa normal. No drainage or sinus tenderness. Throat: lips, mucosa, and tongue normal; teeth and gums normal Neck: no adenopathy, no carotid bruit, no JVD, supple, symmetrical, trachea midline, and thyroid not enlarged, symmetric, no tenderness/mass/nodules Back: symmetric, no curvature. ROM normal. No CVA tenderness. Lungs: clear  to auscultation bilaterally Heart: irreg irreg Abdomen: soft, non-tender; bowel sounds normal; no masses,  no organomegaly Extremities: extremities normal, atraumatic, no cyanosis or edema Pulses: 2+ and symmetric Skin: Skin color, texture, turgor normal. No rashes or lesions Lymph nodes: Cervical, supraclavicular, and axillary nodes normal. Neurologic: Alert and oriented X 3, normal strength and tone. Normal symmetric reflexes. Normal coordination and gait    Assessment:    Healthy female exam.      Plan:    Ghm utd  Check labs  See After Visit Summary for Counseling Recommendations    1. Change in bowel function F/u GI  - Ambulatory referral to Gastroenterology - TSH  2. Primary hypertension Well controlled, no changes to meds. Encouraged heart healthy diet such as the DASH diet and exercise as tolerated.   - CBC with Differential/Platelet - Comprehensive metabolic panel - TSH - Lipid  panel  3. PAD (peripheral artery disease) ([place])   4. Permanent atrial fibrillation [facility]) Per cardiology  5. Dyslipidemia, goal LDL below 70 Encourage heart healthy diet such as MIND or DASH diet, increase exercise, avoid trans fats, simple carbohydrates and processed foods, consider a krill or fish or flaxseed oil cap daily.    6. Essential hypertension Well controlled, no changes to meds. Encouraged heart healthy diet such as the DASH diet and exercise as tolerated.

## 2022-03-15 ENCOUNTER — Ambulatory Visit: Payer: Medicare Other | Admitting: Nurse Practitioner

## 2022-03-15 ENCOUNTER — Encounter: Payer: Self-pay | Admitting: Nurse Practitioner

## 2022-03-15 VITALS — BP 130/86 | HR 88 | Ht 62.0 in | Wt 125.0 lb

## 2022-03-15 DIAGNOSIS — R194 Change in bowel habit: Secondary | ICD-10-CM | POA: Diagnosis not present

## 2022-03-15 NOTE — Progress Notes (Signed)
Agree with assessment and plan as outlined.  

## 2022-03-15 NOTE — Progress Notes (Signed)
? ? ? ?Assessment  ? ?Patient Profile:  ?Evelyn [name] is a 88 y.o. female known to Dr. Havery Moros with a past medical history of PAD, permanent AFIB, HTN, HLD, DM former tobacco use.  Additional medical history as listed in [place] . ? ?Altered bowel habits, chronic. May have up to 6 BMs a day varying in consistency between solid, nugget like or stringy. She is not having any loose stools. Difficult to know but could be constipation?  ? ?Occasional painless rectal bleeding with BM. This is chronic, occurs once every few months or less. She feels bleeding is hemorrhoid related. Last colonoscopy was apparently > 8 years ago but she doesn't know who did it. She isn't opposed to a colonoscopy if needed. Hgb is normal.  ? ?Chronic AFIB, on Eliquis ? ? ?Plan  ? ?Start daily Metamucil to try and improve stool consistency of stools.  ?Increase water intake to at least 48 oz daily ?Follow up with me in 4 weeks. Can further discuss colonoscopy at that time.  ? ?History of Present Illness  ? ?Chief Complaint : irregular bowels.  ? ?Patient was seen twice in 2021 by Nicoletta Ba, PA for bowel habit changes.  She was having more frequent stools at the time.  GI pathogen panel was positive for norovirus.  Fecal elastase was negative.  ESR normal. Symptoms improved with Imodium.  She did not want to have a colonoscopy.  Her appetite was good, her weight was stable. She hasn't been seen since.  ? ?Patient was seen by PCP in early March. She was asked to follow up with [place] for bowel habit changes.  Labs in March including a CMP and CBC were unremarkable ? ?INTERVAL HISTORY:  ?Evelyn [name] says her stools are still about the same as when we saw her in 2021. Stool vary in consistency from being stringy, small nuggets and other times normal. No loose stools.  She may have up to 6 small BMs a days of various consistencies. Not taking any fiber because it didn't help in past but then she didn't take it everyday. Once 'in a blue moon" she has a  small amount of rectal bleeding with BM. This occurs every few months or less. She doesn't drink much water. She drinks tea and coffee mainly but does sip on water during the day. Her weight is stable  ? ? ?Laboratory data: ? ?  Latest Ref Rng & Units 02/01/2022  ?  1:36 PM 04/15/2021  ?  1:50 PM 10/08/2019  ?  1:50 PM  ?Hepatic Function  ?Total Protein 6.0 - 8.3 g/dL 8.0   7.9   7.8    ?Albumin 3.5 - 5.2 g/dL 4.3   4.4   4.3    ?AST 0 - 37 U/L '20   26   22    ' ?ALT 0 - 35 U/L '16   25   19    ' ?Alk Phosphatase 39 - 117 U/L 66   67   62    ?Total Bilirubin 0.2 - 1.2 mg/dL 1.0   1.0   0.9    ? ? ? ?  Latest Ref Rng & Units 02/01/2022  ?  1:36 PM 10/06/2020  ? 10:11 AM 05/21/2020  ?  3:39 PM  ?CBC  ?WBC 4.0 - 10.5 K/uL 6.6   6.8   6.9    ?Hemoglobin 12.0 - 15.0 g/dL 14.0   14.8   13.8    ?Hematocrit 36.0 - 46.0 %  42.2   44.5   40.9    ?Platelets 150.0 - 400.0 K/uL 175.0   221.0   188.0    ? ? ? ?Past Medical History:  ?Diagnosis Date  ? Atrial fibrillation ([place])   ? Hyperlipidemia   ? Hypertension   ? Osteoporosis   ? Post-menopausal   ? HRT  ? ? ?Past Surgical History:  ?Procedure Laterality Date  ? ABDOMINAL HYSTERECTOMY    ? BREAST LUMPECTOMY  11/28/1973  ? EYE SURGERY    ? Cataract removal  ? ? ?Current Medications, Allergies, Family History and Social History were reviewed in [employer] record. ?  ?  ?Current Outpatient Medications  ?Medication Sig Dispense Refill  ? amLODipine (NORVASC) 5 MG tablet TAKE 1 TABLET(5 MG) BY MOUTH DAILY 90 tablet 3  ? apixaban (ELIQUIS) 2.5 MG TABS tablet TAKE 1 TABLET(2.5 MG) BY MOUTH TWICE DAILY 180 tablet 1  ? calcium carbonate (OS-CAL) 600 MG TABS Take 600 mg by mouth daily.    ? Cholecalciferol (VITAMIN D PO) Take 1 capsule by mouth daily. 2000 units    ? glucosamine-chondroitin 500-400 MG tablet Take 1 tablet by mouth daily.     ? Latanoprostene Bunod 0.024 % SOLN Apply 1 drop to eye at bedtime.    ? loperamide (IMODIUM A-D) 2 MG tablet 1 po qd 30 tablet 0  ?  LOTEMAX SM 0.38 % GEL     ? metoprolol succinate (TOPROL-XL) 100 MG 24 hr tablet TAKE 1 TABLET(100 MG) BY MOUTH DAILY WITH OR IMMEDIATELY FOLLOWING A MEAL 90 tablet 2  ? moxifloxacin (VIGAMOX) 0.5 % ophthalmic solution Place into the right eye.    ? Red Yeast Rice 600 MG TABS Take 1 tablet by mouth daily.    ? sodium chloride (MURO 128) 2 % ophthalmic solution Place 1 drop into both eyes as needed for eye irritation.    ? timolol (TIMOPTIC) 0.5 % ophthalmic solution 1 drop every morning.    ? ?No current facility-administered medications for this visit.  ? ? ?Review of Systems: ?No chest pain. No shortness of breath. No urinary complaints.  ? ? ?Physical Exam  ? ?Wt Readings from Last 3 Encounters:  ?03/15/22 125 lb (56.7 kg)  ?02/01/22 124 lb 3.2 oz (56.3 kg)  ?09/21/21 123 lb (55.8 kg)  ? ? ?BP 130/86   Pulse 88   Ht '5\' 2"'  (1.575 m)   Wt 125 lb (56.7 kg)   BMI 22.86 kg/m?  ?Constitutional:  Generally well appearing female in no acute distress. ?Psychiatric: Pleasant. Normal mood and affect. Behavior is normal. ?EENT: Pupils normal.  Conjunctivae are normal. No scleral icterus. ?Neck supple.  ?Cardiovascular: Normal rate, regular rhythm. No edema ?Pulmonary/chest: Effort normal and breath sounds normal. No wheezing, rales or rhonchi. ?Abdominal: Soft, nondistended, nontender. Bowel sounds active throughout. There are no masses palpable. No hepatomegaly. ?Neurological: Alert and oriented to person place and time. ?Skin: Skin is warm and dry. No rashes noted. ? ?Tye Savoy, NP  03/15/2022, 3:17 PM ? ?Cc:  ?Roma Schanz R, * ? ? ? ? ? ? ? ?

## 2022-03-15 NOTE — Patient Instructions (Signed)
Increase water intake to 48 oz per day ? ?Take Metamucil every day ? ?If you are age 86 or older, your body mass index should be between 23-30. Your Body mass index is 22.86 kg/m?Marland Kitchen If this is out of the aforementioned range listed, please consider follow up with your Primary Care Provider. ? ?If you are age 40 or younger, your body mass index should be between 19-25. Your Body mass index is 22.86 kg/m?Marland Kitchen If this is out of the aformentioned range listed, please consider follow up with your Primary Care Provider.  ? ?________________________________________________________ ? ?The [facility] providers would like to encourage you to use [facility] to communicate with providers for non-urgent requests or questions.  Due to long hold times on the telephone, sending your provider a message by [facility] may be a faster and more efficient way to get a response.  Please allow 48 business hours for a response.  Please remember that this is for non-urgent requests.  ?_______________________________________________________  ? ?I appreciate the  opportunity to care for you ? ?Thank You  ? ?[place]  ?

## 2022-03-17 ENCOUNTER — Telehealth: Payer: Self-pay | Admitting: Nurse Practitioner

## 2022-03-17 NOTE — Telephone Encounter (Signed)
Inbound call from patient requesting a call back to discuss if she can take fiber water instead of metamucil  ?

## 2022-03-18 NOTE — Telephone Encounter (Signed)
Spoke with pt and she said that she has an equate bottle of fiber that has an orange label. Pt states that bottle states it is 100% psyllium husk and wanted to know if she could use this instead of metamucil. Let pt know that this sounds like the equate version of metamucil and that would be alright to use. Pt verbalized understanding and had no other concerns at end of call.  ?

## 2022-03-23 DIAGNOSIS — H401111 Primary open-angle glaucoma, right eye, mild stage: Secondary | ICD-10-CM | POA: Diagnosis not present

## 2022-03-23 DIAGNOSIS — H401122 Primary open-angle glaucoma, left eye, moderate stage: Secondary | ICD-10-CM | POA: Diagnosis not present

## 2022-04-14 ENCOUNTER — Ambulatory Visit: Payer: Medicare Other | Admitting: Nurse Practitioner

## 2022-04-19 ENCOUNTER — Ambulatory Visit: Payer: Medicare Other | Admitting: Nurse Practitioner

## 2022-04-27 ENCOUNTER — Ambulatory Visit (INDEPENDENT_AMBULATORY_CARE_PROVIDER_SITE_OTHER): Payer: Medicare Other | Admitting: Pharmacist

## 2022-04-27 DIAGNOSIS — Z87891 Personal history of nicotine dependence: Secondary | ICD-10-CM | POA: Diagnosis not present

## 2022-04-27 DIAGNOSIS — I739 Peripheral vascular disease, unspecified: Secondary | ICD-10-CM

## 2022-04-27 DIAGNOSIS — I4821 Permanent atrial fibrillation: Secondary | ICD-10-CM

## 2022-04-27 DIAGNOSIS — M8580 Other specified disorders of bone density and structure, unspecified site: Secondary | ICD-10-CM

## 2022-04-27 DIAGNOSIS — E785 Hyperlipidemia, unspecified: Secondary | ICD-10-CM | POA: Diagnosis not present

## 2022-04-27 DIAGNOSIS — I10 Essential (primary) hypertension: Secondary | ICD-10-CM | POA: Diagnosis not present

## 2022-04-27 DIAGNOSIS — I4891 Unspecified atrial fibrillation: Secondary | ICD-10-CM

## 2022-04-27 DIAGNOSIS — R198 Other specified symptoms and signs involving the digestive system and abdomen: Secondary | ICD-10-CM

## 2022-04-27 NOTE — Chronic Care Management (AMB) (Signed)
Chronic Care Management Pharmacy Note  04/27/2022 Name:  Evelyn [name] MRN:  [id number] DOB:  Apr 03, 1930  Summary:  Reviewed medications and updated med list. Removed Moxifloxacin and Lotemax eye drops since patient has completed after he cataracts surgery in November 2022.  Discussed last DEXA was 2014. Patient is considering rechecking but wants to think about it. Completed fall assessment and discussed fall prevention.  Patient has not reached coverage gap in 2023 yet. Will continue to monitor and assist with PCP for Eliquis if she wishes to do so this year.  Discussed benefits of psyllium / fiber daily to help with constipation. She has follow up in 1 to 2 weeks with GI.   Subjective: Evelyn [name] is an 88 y.o. year old female who is a primary patient of Ann Held, DO.  The CCM team was consulted for assistance with disease management and care coordination needs.    Engaged with patient by telephone for follow up visit in response to provider referral for pharmacy case management and/or care coordination services.   Consent to Services:  The patient was given information about Chronic Care Management services, agreed to services, and gave verbal consent prior to initiation of services.  Please see initial visit note for detailed documentation.   Patient Care Team: Carollee Herter, Alferd Apa, DO as PCP - General Wellington Hampshire, MD as PCP - Cardiology (Cardiology) Jerline Pain Mingo Amber, DO as Consulting Physician (Osteopathic Medicine) Cherre Robins, RPH-CPP (Pharmacist) Allyn Kenner, MD (Dermatology)  Recent office visits: 02/01/2022 - Fam Med (Dr Etter SjogrenCheri Rous) complete physical. noted change in bowel function. Referred to GI. No med changes noted.  09/21/2021 - annual wellness visit   Recent consult visits: 03/15/2022 - GI Chester Holstein, NP) Seen for change in bowel habits. Recommended she start daily Metamucil to improve stook consistency. Increase water intake to 48 ounces  daily. F/U in 4 weeks.  08/24/2021 - Cardio (Dr Fletcher Anon). Seen for HTN, PAD and permenant a fib. No med changes noted.    Hospital visits: None in previous 6 months  Objective:  Lab Results  Component Value Date   CREATININE 0.73 02/01/2022   CREATININE 0.72 04/15/2021   CREATININE 0.65 10/08/2019    No results found for: HGBA1C Last diabetic Eye exam: No results found for: HMDIABEYEEXA  Last diabetic Foot exam: No results found for: HMDIABFOOTEX      Component Value Date/Time   CHOL 208 (H) 02/01/2022 1336   TRIG 113.0 02/01/2022 1336   HDL 65.90 02/01/2022 1336   CHOLHDL 3 02/01/2022 1336   VLDL 22.6 02/01/2022 1336   LDLCALC 119 (H) 02/01/2022 1336   LDLDIRECT 130.5 03/26/2013 0916       Latest Ref Rng & Units 02/01/2022    1:36 PM 04/15/2021    1:50 PM 10/08/2019    1:50 PM  Hepatic Function  Total Protein 6.0 - 8.3 g/dL 8.0   7.9   7.8    Albumin 3.5 - 5.2 g/dL 4.3   4.4   4.3    AST 0 - 37 U/L '20   26   22    ' ALT 0 - 35 U/L '16   25   19    ' Alk Phosphatase 39 - 117 U/L 66   67   62    Total Bilirubin 0.2 - 1.2 mg/dL 1.0   1.0   0.9      Lab Results  Component Value Date/Time   TSH 1.99  02/01/2022 01:36 PM   TSH 3.12 03/12/2020 03:20 PM       Latest Ref Rng & Units 02/01/2022    1:36 PM 10/06/2020   10:11 AM 05/21/2020    3:39 PM  CBC  WBC 4.0 - 10.5 K/uL 6.6   6.8   6.9    Hemoglobin 12.0 - 15.0 g/dL 14.0   14.8   13.8    Hematocrit 36.0 - 46.0 % 42.2   44.5   40.9    Platelets 150.0 - 400.0 K/uL 175.0   221.0   188.0      Lab Results  Component Value Date/Time   VD25OH 61 06/29/2010 12:00 AM   VD25OH 54 03/25/2009 08:44 PM    Clinical ASCVD: Yes  - PAD The ASCVD Risk score (Arnett DK, et al., 2019) failed to calculate for the following reasons:   The 2019 ASCVD risk score is only valid for ages 52 to 82    Other: CHADS2VASc = 5  Social History   Tobacco Use  Smoking Status Former   Packs/day: 0.75   Years: 18.00   Pack years: 13.50    Types: Cigarettes   Quit date: 02/20/1973   Years since quitting: 49.2  Smokeless Tobacco Never   BP Readings from Last 3 Encounters:  03/15/22 130/86  02/01/22 140/90  08/24/21 138/86   Pulse Readings from Last 3 Encounters:  03/15/22 88  02/01/22 87  08/24/21 70   Wt Readings from Last 3 Encounters:  03/15/22 125 lb (56.7 kg)  02/01/22 124 lb 3.2 oz (56.3 kg)  09/21/21 123 lb (55.8 kg)    Assessment: Review of patient past medical history, allergies, medications, health status, including review of consultants reports, laboratory and other test data, was performed as part of comprehensive evaluation and provision of chronic care management services.   SDOH:  (Social Determinants of Health) assessments and interventions performed:  SDOH Interventions    Flowsheet Row Most Recent Value  SDOH Interventions   Physical Activity Interventions Intervention Not Indicated  [discussed increasing frequency and length of exericse.]       CCM Care Plan  Allergies  Allergen Reactions   Vicodin [Hydrocodone-Acetaminophen] Other (See Comments)    Abdominal Pain   Pletaal [Cilostazol] Diarrhea   Statins     Medications Reviewed Today     Reviewed by Cherre Robins, RPH-CPP (Pharmacist) on 04/27/22 at 1457  Med List Status: <None>   Medication Order Taking? Sig Documenting Provider Last Dose Status Informant  amLODipine (NORVASC) 5 MG tablet [id number] Yes TAKE 1 TABLET(5 MG) BY MOUTH DAILY Carollee Herter, Alferd Apa, DO Taking Active   apixaban (ELIQUIS) 2.5 MG TABS tablet [id number] Yes TAKE 1 TABLET(2.5 MG) BY MOUTH TWICE DAILY Wellington Hampshire, MD Taking Active   calcium carbonate (OS-CAL) 600 MG TABS [id number] Yes Take 600 mg by mouth daily. [provider] Taking Active   Cholecalciferol (VITAMIN D PO) [id number] Yes Take 1 capsule by mouth daily. 2000 units [provider] Taking Active   glucosamine-chondroitin 500-400 MG tablet [id number] Yes Take 1 tablet by mouth  daily.  [provider] Taking Active   Latanoprostene Bunod 0.024 % SOLN [id number] Yes Apply 1 drop to eye at bedtime. [provider] Taking Active   metoprolol succinate (TOPROL-XL) 100 MG 24 hr tablet [id number] Yes TAKE 1 TABLET(100 MG) BY MOUTH DAILY WITH OR IMMEDIATELY FOLLOWING A MEAL Arida, Mertie Clause, MD Taking Active   psyllium (METAMUCIL) 58.6 % powder [id number] Yes Take 1  packet by mouth daily. [provider] Taking Active   Red Yeast Rice 600 MG TABS [id number] Yes Take 1 tablet by mouth daily. [provider] Taking Active   sodium chloride (MURO 128) 2 % ophthalmic solution [id number] Yes Place 1 drop into both eyes as needed for eye irritation. [provider] Taking Active   timolol (TIMOPTIC) 0.5 % ophthalmic solution [id number] Yes 1 drop every morning. [provider] Taking Active             Patient Active Problem List   Diagnosis Date Noted   Change in bowel function 02/01/2022   Hyperlipidemia 04/14/2020   Seasonal allergies 11/27/2019   Atrial fibrillation ([place]) 03/26/2019   Anticoagulated 03/26/2019   Loose stools 04/14/2015   Osteoarthritis 04/14/2015   Chronic flank pain 06/17/2013   Plantar fasciitis 05/25/2013   PAD (peripheral artery disease) ([place]) 03/07/2013   Pain in joint, lower leg 02/05/2013   CARDIAC MURMUR 06/29/2010   VARICOSE VEINS, LOWER EXTREMITIES 06/02/2010   PLANTAR WART, LEFT 04/03/2009   MOLE 04/03/2009   HIP PAIN, RIGHT 04/03/2009   Dyslipidemia, goal LDL below 70 06/29/2007   ARTIFICIAL MENOPAUSE 06/29/2007   Pain in limb 06/29/2007   BREAST BIOPSY, HX OF 06/29/2007   GLAUCOMA, LEFT EYE 04/27/2007   Essential hypertension 04/27/2007   Osteoporosis 04/27/2007    Immunization History  Administered Date(s) Administered   Fluad Quad(high Dose 65+) 10/08/2019, 10/06/2020, 10/07/2021   Influenza Split 09/15/2011, 09/20/2012   Influenza Whole 11/28/2000, 10/10/2007, 08/20/2008,  09/24/2009, 08/25/2010   Influenza, High Dose Seasonal PF 10/01/2015, 09/29/2016, 09/04/2018   Influenza,inj,Quad PF,6+ Mos 08/15/2013, 10/07/2014   PFIZER Comirnaty(Gray Top)Covid-19 Tri-Sucrose Vaccine 04/15/2021   PFIZER(Purple Top)SARS-COV-2 Vaccination 01/09/2020, 02/16/2020, 09/16/2020   Pneumococcal Conjugate-13 10/07/2014   Pneumococcal Polysaccharide-23 11/28/2000   Td 11/28/2000   Tdap 08/05/2011   Zoster Recombinat (Shingrix) 04/23/2019, 06/22/2019   Zoster, Live 12/28/2007    Conditions to be addressed/monitored: Atrial Fibrillation, HTN, HLD and PAD; osteoarthritis; glaucoma; osteoporosis  Care Plan : General Pharmacy (Adult)  Updates made by Cherre Robins, RPH-CPP since 04/27/2022 12:00 AM     Problem: Medication and Chronic care management, education and care coordination   Priority: High  Onset Date: 04/16/2021  Note:   Current Barriers:  Unable to achieve control of hyperlipidemia  Chronic Disease Management support, education, and care coordination needs related to Atrial Fib, Hypertension, Hyperlipidemia, Peripheral Artery Disease, Osteoporosis, Glaucoma, Chronic Diarrhea Usually reaches Medicare coverage gap - cost of Eliquis increases significantly  Pharmacist Clinical Goal(s):  Over the next 180 days, patient will achieve control of lipids as evidenced by LDL <70 contact provider office for questions/concerns as evidenced notation of same in electronic health record through collaboration with PharmD and provider.   Interventions: 1:1 collaboration with Carollee Herter, Alferd Apa, DO regarding development and update of comprehensive plan of care as evidenced by provider attestation and co-signature Inter-disciplinary care team collaboration (see longitudinal plan of care) Comprehensive medication review performed; medication list updated in electronic medical record   Hypertension Currently at goal BP of <140/90 Current regimen:  Amlodipine 63m daily Metoprolol  succinate 1064mdaily Interventions: Maintain hypertension medication regimen Discussed blood pressure goals  Hyperlipidemia / PAD Lab Results  Component Value Date/Time   LDLCALC 122 (H) 04/15/2021   LDLCACL 117 10/06/2020  Not at LDL goal; LDL goal < 70 Intolerant to statins Current regimen:  Red yeast rice 60098maily Interventions: Reviewed recent lipid panel results; Discussed LDL goal; She has great HDL  and Tg at goal of <150 Discussed limiting intake of saturated and trans fat Maintain cholesterol medication regimen.  Encouraged patient to try to walk or use stationary bike for exercise - start with 10 to 15 minutes per day and work up as able.  Loose Stools Improved since GI recommended psyllium / fiber powder FOBT negatvie 05/18/2021 Current regimen:  Fiber powder daily  Interventions:  Maintain bowel medication regimen. Follow up with gastroenterologist as recommended.    Osteoporosis Last DEXA was in 2014;  Lowest T-Score was -2.7 at neck of left hip. Noted no significant change since previous DEXA in 2012. No history of fractures One fall in the last year with no injury; patient tripped over garden hose in yard; had sore tailbone. Current regimen:  Calcium 613m daily Vitamin D 2000 units daily Interventions:  Continue daily intake of 12039mcalcium through diet and/or supplementation Consider repeat DEXA (noting patient may not want to begin treatment for osteoporosis) Fall assessment completed and discuss fall prevention  Atrial Fibrillation:  Patient denies dizziness or rapid HR CHADS2VASc = 5 Current regimen:  Eliquis 2.37m9mwice a day Metoprolol ER 100m89mily   Interventions: Continue current regimen Financial assessment: She usually reaches Medicare Coverage Gap in Fall each year. In 2022 she declined to complete patient assistance program for Eliquis. Will continue to follow up in 2023 and assist if patient is open to medication assistance  program once OOP (3%) requirement is met.   Medication management Pharmacist Clinical Goal(s): Over the next 90 days, patient will work with PharmD and providers to maintain optimal medication adherence Current pharmacy: [facility] Interventions Comprehensive medication review performed. Continue current medication management strategy Adherence reviewed;  Coordinated with office staff for follow up appt with PCP.  Patient Goals/Self-Care Activities Over the next 180 days, patient will:  take medications as prescribed,  collaborate with provider on medication access solutions, and  engage in dietary modifications by limiting intake of fried foods, animal products and saturated and trans fat. Try to either walk or use stationary bike for exercise - start with 10 to 15 minutes per day and work up as able.  Follow Up Plan: Telephone follow up appointment with care management team member scheduled for:  4 months       Medication Assistance:  Not eligible to apply for patient assistance program for Eliquis until reaches coverage gap AND spends 3% of income out of pocket. Last year patient decided not to apply. Will follow for 2023.   [place] [facility]:  [facility]: [phone]: [phone]: [phone]: [id number] [facility] [facility] [address]: [phone]: [phone] Up:  Patient agrees to Care Plan and Follow-up.  Plan: Telephone follow up appointment with care management team member scheduled for:  f/u with clinical pharmacist in 3 to 4 months  TammCherre RobinsarmD Clinical Pharmacist [facility]  SW MedCNewton[facility]

## 2022-04-27 NOTE — Patient Instructions (Addendum)
Mrs. Sagona, It was a pleasure speaking with you  Below is a summary of your health goals and care plan  Patient Goals/Self-Care Activities take medications as prescribed,  collaborate with provider on medication access solutions, and  engage in dietary modifications by limiting intake of fried foods, animal products and saturated and trans fat. Continue to take psyllium / fiber daily Try to either walk or use stationary bike for exercise - start with 10 to 15 minutes per day and work up as able.   If you have any questions or concerns, please feel free to contact me either at the phone number below or with a MyChart message.   Keep up the good work!  Cherre Robins, PharmD Clinical Pharmacist [place] [phone] (direct line)  [phone] (main office number)   The patient verbalized understanding of instructions, educational materials, and care plan provided today and agreed to receive a mailed copy of patient instructions, educational materials, and care plan.    Fall Prevention in the Home, Adult Falls can cause injuries and affect people of all ages. There are many simple things that you can do to make your home safe and to help prevent falls. Ask for help when making these changes, if needed. What actions can I take to prevent falls? General instructions Use good lighting in all rooms. Replace any light bulbs that burn out, turn on lights if it is dark, and use night-lights. Place frequently used items in easy-to-reach places. Lower the shelves around your home if necessary. Set up furniture so that there are clear paths around it. Avoid moving your furniture around. Remove throw rugs and other tripping hazards from the floor. Avoid walking on wet floors. Fix any uneven floor surfaces. Add color or contrast paint or tape to grab bars and handrails in your home. Place contrasting color strips on the first and last steps of staircases. When  you use a stepladder, make sure that it is completely opened and that the sides and supports are firmly locked. Have someone hold the ladder while you are using it. Do not climb a closed stepladder. Know where your pets are when moving through your home. What can I do in the bathroom?     Keep the floor dry. Immediately clean up any water that is on the floor. Remove soap buildup in the tub or shower regularly. Use nonskid mats or decals on the floor of the tub or shower. Attach bath mats securely with double-sided, nonslip rug tape. If you need to sit down while you are in the shower, use a plastic, nonslip stool. Install grab bars by the toilet and in the tub and shower. Do not use towel bars as grab bars. What can I do in the bedroom? Make sure that a bedside light is easy to reach. Do not use oversized bedding that reaches the floor. Have a firm chair that has side arms to use for getting dressed. What can I do in the kitchen? Clean up any spills right away. If you need to reach for something above you, use a sturdy step stool that has a grab bar. Keep electrical cables out of the way. Do not use floor polish or wax that makes floors slippery. If you must use wax, make sure that it is non-skid floor wax. What can I do with my stairs? Do not leave any items on the stairs. Make sure that you have a light switch at the top and the  bottom of the stairs. Have them installed if you do not have them. Make sure that there are handrails on both sides of the stairs. Fix handrails that are broken or loose. Make sure that handrails are as long as the staircases. Install non-slip stair treads on all stairs in your home. Avoid having throw rugs at the top or bottom of stairs, or secure the rugs with carpet tape to prevent them from moving. Choose a carpet design that does not hide the edge of steps on the stairs. Check any carpeting to make sure that it is firmly attached to the stairs. Fix any  carpet that is loose or worn. What can I do on the outside of my home? Use bright outdoor lighting. Regularly repair the edges of walkways and driveways and fix any cracks. Remove high doorway thresholds. Trim any shrubbery on the main path into your home. Regularly check that handrails are securely fastened and in good repair. Both sides of all steps should have handrails. Install guardrails along the edges of any raised decks or porches. Clear walkways of debris and clutter, including tools and rocks. Have leaves, snow, and ice cleared regularly. Use sand or salt on walkways during winter months. In the garage, clean up any spills right away, including grease or oil spills. What other actions can I take? Wear closed-toe shoes that fit well and support your feet. Wear shoes that have rubber soles or low heels. Use mobility aids as needed, such as canes, walkers, scooters, and crutches. Review your medicines with your health care provider. Some medicines can cause dizziness or changes in blood pressure, which increase your risk of falling. Talk with your health care provider about other ways that you can decrease your risk of falls. This may include working with a physical therapist or trainer to improve your strength, balance, and endurance. Where to find more information Centers for Disease Control and Prevention, [employer]: http://www.wolf.info/ National Institute on Aging: http://kim-miller.com/ Contact a health care provider if: You are afraid of falling at home. You feel weak, drowsy, or dizzy at home. You fall at home. Summary There are many simple things that you can do to make your home safe and to help prevent falls. Ways to make your home safe include removing tripping hazards and installing grab bars in the bathroom. Ask for help when making these changes in your home. This information is not intended to replace advice given to you by your health care provider. Make sure you discuss any questions  you have with your health care provider. Document Revised: 08/16/2021 Document Reviewed: 06/17/2020 Elsevier Patient Education  [place].

## 2022-05-09 ENCOUNTER — Other Ambulatory Visit: Payer: Self-pay

## 2022-05-09 DIAGNOSIS — I4821 Permanent atrial fibrillation: Secondary | ICD-10-CM

## 2022-05-09 MED ORDER — APIXABAN 2.5 MG PO TABS: ORAL_TABLET | ORAL | 1 refills | Status: DC

## 2022-05-09 NOTE — Telephone Encounter (Signed)
Prescription refill request for Eliquis received. Indication: Afib  Last office visit: 08/24/21 Fletcher Anon)  Scr: 0.73 (02/01/22)  Age: 88 Weight: 56.7kg  Appropriate dose and refill sent to requested pharmacy.

## 2022-05-17 ENCOUNTER — Telehealth: Payer: Self-pay

## 2022-05-17 ENCOUNTER — Ambulatory Visit: Payer: Medicare Other | Admitting: Nurse Practitioner

## 2022-05-17 ENCOUNTER — Encounter: Payer: Self-pay | Admitting: Nurse Practitioner

## 2022-05-17 VITALS — BP 140/72 | HR 81 | Ht 62.0 in | Wt 125.0 lb

## 2022-05-17 DIAGNOSIS — R198 Other specified symptoms and signs involving the digestive system and abdomen: Secondary | ICD-10-CM

## 2022-05-17 DIAGNOSIS — K625 Hemorrhage of anus and rectum: Secondary | ICD-10-CM

## 2022-05-17 MED ORDER — NA SULFATE-K SULFATE-MG SULF 17.5-3.13-1.6 GM/177ML PO SOLN: 1.0000 | ORAL | 0 refills | Status: DC

## 2022-05-17 NOTE — Progress Notes (Signed)
Assessment &  Plan   88 yo female with a pmh of PAD, permanent AFIB on Eliquis, HTN, HLD, DM,  former tobacco use.  Additional medical history as listed in [place] .  #Irregular bowel habits over the last couple years and also intermittent rectal bleeding . No lasting improvement with increased water intake and fiber. Inflammatory markers and fecal elastase negative. Hgb 14 in March 2023 Rectal bleeding probably hemorrhoidal but neoplasm is not excluded. She hasn't had a colonoscopy in 8+ years. We discussed proceeding with a diagnostic colonoscopy. Despite her age she seems to be in relatively good health. The risks and benefits of colonoscopy with possible polypectomy / biopsies were discussed and the patient agrees to proceed. She prefers to wait until late July after beach trip.   # Afib, on Eliquis Hold Eliquis for 2 days before procedure - will instruct when and how to resume after procedure. Patient understands that there is a low but real risk of cardiovascular event such as heart attack, stroke, or embolism /  thrombosis while off blood thinner. The patient consents to proceed. Will communicate by phone or EMR with patient's prescribing provider to confirm that holding Eliquis is reasonable in this case.   HPI   Chief Complaint : follow up on bowel changes  Evelyn [name] has been seen here a few times over the last couple of years for regular bowel habits in occasional painless rectal bleeding.  Please refer to those office notes.  I saw her in mid April and started her on daily fiber and increased water intake.  She is here for follow-up  Interval History:  She got as close as she could to drinking 48 oz daily and has been taking the fiber every day. Initially when she started the fiber she had a lot of bowel movements and thought that maybe constipation was in fact the underlying problem. Stools did become more normal for a short while but then returned to what they were. She has been having  several bowel movements a day. Generally she does have a normal BM once a day but this is often followed later by a "stringy" or "nugget like" BM. She feels fine. No abdominal pain.  As mentioned at previous's visit she does occasionally have painless rectal bleeding with bowel movements ( as recently as a  couple of weeks ago).   No unexpected weight loss  Labs:     Latest Ref Rng & Units 02/01/2022    1:36 PM 10/06/2020   10:11 AM 05/21/2020    3:39 PM  CBC  WBC 4.0 - 10.5 K/uL 6.6  6.8  6.9   Hemoglobin 12.0 - 15.0 g/dL 14.0  14.8  13.8   Hematocrit 36.0 - 46.0 % 42.2  44.5  40.9   Platelets 150.0 - 400.0 K/uL 175.0  221.0  188.0        Latest Ref Rng & Units 02/01/2022    1:36 PM 04/15/2021    1:50 PM 10/08/2019    1:50 PM  Hepatic Function  Total Protein 6.0 - 8.3 g/dL 8.0  7.9  7.8   Albumin 3.5 - 5.2 g/dL 4.3  4.4  4.3   AST 0 - 37 U/L '20  26  22   ' ALT 0 - 35 U/L '16  25  19   ' Alk Phosphatase 39 - 117 U/L 66  67  62   Total Bilirubin 0.2 - 1.2 mg/dL 1.0  1.0  0.9  Past Medical History:  Diagnosis Date   Atrial fibrillation ([place])    Hyperlipidemia    Hypertension    Osteoporosis    Post-menopausal    HRT    Past Surgical History:  Procedure Laterality Date   ABDOMINAL HYSTERECTOMY     BREAST LUMPECTOMY  11/28/1973   EYE SURGERY     Cataract removal    Current Medications, Allergies, Family History and Social History were reviewed in [employer] record.     Current Outpatient Medications  Medication Sig Dispense Refill   amLODipine (NORVASC) 5 MG tablet TAKE 1 TABLET(5 MG) BY MOUTH DAILY 90 tablet 3   apixaban (ELIQUIS) 2.5 MG TABS tablet TAKE 1 TABLET(2.5 MG) BY MOUTH TWICE DAILY 180 tablet 1   calcium carbonate (OS-CAL) 600 MG TABS Take 600 mg by mouth daily.     Cholecalciferol (VITAMIN D PO) Take 1 capsule by mouth daily. 2000 units     glucosamine-chondroitin 500-400 MG tablet Take 1 tablet by mouth daily.      Latanoprostene  Bunod 0.024 % SOLN Apply 1 drop to eye at bedtime.     metoprolol succinate (TOPROL-XL) 100 MG 24 hr tablet TAKE 1 TABLET(100 MG) BY MOUTH DAILY WITH OR IMMEDIATELY FOLLOWING A MEAL 90 tablet 2   NON FORMULARY Place 1 drop into the left eye daily. Visulta  1 drop in right eye     psyllium (METAMUCIL) 58.6 % powder Take 1 packet by mouth daily.     Red Yeast Rice 600 MG TABS Take 1 tablet by mouth daily.     sodium chloride (MURO 128) 2 % ophthalmic solution Place 1 drop into both eyes as needed for eye irritation.     timolol (TIMOPTIC) 0.5 % ophthalmic solution 1 drop every morning.     No current facility-administered medications for this visit.    Review of Systems: No chest pain. No shortness of breath. No urinary complaints.    Physical Exam  Wt Readings from Last 3 Encounters:  05/17/22 125 lb (56.7 kg)  03/15/22 125 lb (56.7 kg)  02/01/22 124 lb 3.2 oz (56.3 kg)    BP 140/72   Pulse 81   Ht '5\' 2"'  (1.575 m)   Wt 125 lb (56.7 kg)   BMI 22.86 kg/m  Constitutional:  Generally well appearing female in no acute distress. Psychiatric: Pleasant. Normal mood and affect. Behavior is normal. EENT: Pupils normal.  Conjunctivae are normal. No scleral icterus. Neck supple.  Cardiovascular: Normal rate, regular rhythm. No edema Pulmonary/chest: Effort normal and breath sounds normal. No wheezing, rales or rhonchi. Abdominal: Soft, nondistended, nontender. Bowel sounds active throughout. There are no masses palpable. No hepatomegaly. Neurological: Alert and oriented to person place and time. Skin: Skin is warm and dry. No rashes noted.  Tye Savoy, NP  05/17/2022, 2:49 PM  Cc:  Carollee Herter, Alferd Apa, *

## 2022-05-17 NOTE — Patient Instructions (Signed)
You have been scheduled for a colonoscopy. Please follow written instructions given to you at your visit today.  Please pick up your prep supplies at the pharmacy within the next 1-3 days. If you use inhalers (even only as needed), please bring them with you on the day of your procedure.  We have sent the following medications to your pharmacy for you to pick up at your convenience: [place] will be contaced by our office prior to your procedure for directions on holding your Eliquis.  If you do not hear from our office 1 week prior to your scheduled procedure, please call [phone] to discuss.  If you are age 16 or older, your body mass index should be between 23-30. Your Body mass index is 22.86 kg/m. If this is out of the aforementioned range listed, please consider follow up with your Primary Care Provider.  If you are age 64 or younger, your body mass index should be between 19-25. Your Body mass index is 22.86 kg/m. If this is out of the aformentioned range listed, please consider follow up with your Primary Care Provider.   ________________________________________________________  The [facility] providers would like to encourage you to use [facility] to communicate with providers for non-urgent requests or questions.  Due to long hold times on the telephone, sending your provider a message by [facility] may be a faster and more efficient way to get a response.  Please allow 48 business hours for a response.  Please remember that this is for non-urgent requests.  _______________________________________________________  Thank you for choosing me and [facility].  Tye Savoy NP

## 2022-05-17 NOTE — Telephone Encounter (Signed)
Request for surgical clearance:     Endoscopy Procedure  What type of surgery is being performed?     Colonoscopy   When is this surgery scheduled?     06/20/22  What type of clearance is required ?   Pharmacy  Are there any medications that need to be held prior to surgery and how long? Eliquis x2 days prior to procedure   Practice name and name of physician performing surgery?      [facility]-Dr.Armbruster   What is your office phone and fax number?      Phone- [phone]  [phone]  Anesthesia type (None, local, MAC, general) ?       MAC

## 2022-05-18 NOTE — Progress Notes (Signed)
Agree with assessment as outlined. Patient is 88 years old, however despite conservative measures continues to have rectal bleeding and altered bowel habits and seen multiple times for this issue. She is otherwise very functional and in good shape for her age. I discussed this case with Tye Savoy who felt patient understood risks of the procedure and risks of not doing the procedure, and due to continued bleeding symptoms with need for ongoing anticoagulation, the patient wanted to have the colonoscopy done. Further recommendations pending the results of her exam.

## 2022-05-19 NOTE — Telephone Encounter (Signed)
   Name: Evelyn [name]  DOB: 1930-10-22  MRN: [id number]   Primary Cardiologist: [name], MD  Chart reviewed as part of pre-operative protocol coverage. We have been asked for guidance to hold eliquis for colonoscopy.   Per our clinical pharmacist:  CHA2DS2-VASc Score = 5      Per office protocol, patient can hold Eliquis for 2 days prior to procedure.   Patient will not need bridging with Lovenox (enoxaparin) around procedure   I will route this recommendation to the requesting party via [facility] fax function and remove from pre-op pool. Please call with questions.  Tami Lin Jericho Alcorn, PA 05/19/2022, 9:04 AM

## 2022-05-25 DIAGNOSIS — L820 Inflamed seborrheic keratosis: Secondary | ICD-10-CM | POA: Diagnosis not present

## 2022-06-13 ENCOUNTER — Encounter: Payer: Self-pay | Admitting: Gastroenterology

## 2022-06-20 ENCOUNTER — Encounter: Payer: Self-pay | Admitting: Gastroenterology

## 2022-06-20 ENCOUNTER — Ambulatory Visit (AMBULATORY_SURGERY_CENTER): Payer: Medicare Other | Admitting: Gastroenterology

## 2022-06-20 VITALS — BP 140/97 | HR 80 | Temp 96.6°F | Resp 14 | Ht 62.0 in | Wt 125.0 lb

## 2022-06-20 DIAGNOSIS — D123 Benign neoplasm of transverse colon: Secondary | ICD-10-CM

## 2022-06-20 DIAGNOSIS — R194 Change in bowel habit: Secondary | ICD-10-CM | POA: Diagnosis not present

## 2022-06-20 DIAGNOSIS — K5520 Angiodysplasia of colon without hemorrhage: Secondary | ICD-10-CM | POA: Diagnosis not present

## 2022-06-20 DIAGNOSIS — D125 Benign neoplasm of sigmoid colon: Secondary | ICD-10-CM | POA: Diagnosis not present

## 2022-06-20 DIAGNOSIS — K621 Rectal polyp: Secondary | ICD-10-CM

## 2022-06-20 DIAGNOSIS — R198 Other specified symptoms and signs involving the digestive system and abdomen: Secondary | ICD-10-CM

## 2022-06-20 DIAGNOSIS — K625 Hemorrhage of anus and rectum: Secondary | ICD-10-CM

## 2022-06-20 DIAGNOSIS — D128 Benign neoplasm of rectum: Secondary | ICD-10-CM | POA: Diagnosis not present

## 2022-06-20 DIAGNOSIS — K649 Unspecified hemorrhoids: Secondary | ICD-10-CM | POA: Diagnosis not present

## 2022-06-20 DIAGNOSIS — I10 Essential (primary) hypertension: Secondary | ICD-10-CM | POA: Diagnosis not present

## 2022-06-20 DIAGNOSIS — I4891 Unspecified atrial fibrillation: Secondary | ICD-10-CM | POA: Diagnosis not present

## 2022-06-20 DIAGNOSIS — D124 Benign neoplasm of descending colon: Secondary | ICD-10-CM | POA: Diagnosis not present

## 2022-06-20 MED ORDER — SODIUM CHLORIDE 0.9 % IV SOLN: 500.0000 mL | INTRAVENOUS | Status: DC

## 2022-06-20 NOTE — Progress Notes (Signed)
Pt non-responsive, VVS, Report to RN  °

## 2022-06-20 NOTE — Patient Instructions (Addendum)
Resume Eliquis (apixaban) tomorrow 06/21/22  Handout on polyps, diverticulosis and hemorrhoids provided   Await pathology results.   Continue current medications.   YOU HAD AN ENDOSCOPIC PROCEDURE TODAY AT [facility]:   Refer to the procedure report that was given to you for any specific questions about what was found during the examination.  If the procedure report does not answer your questions, please call your gastroenterologist to clarify.  If you requested that your care partner not be given the details of your procedure findings, then the procedure report has been included in a sealed envelope for you to review at your convenience later.  YOU SHOULD EXPECT: Some feelings of bloating in the abdomen. Passage of more gas than usual.  Walking can help get rid of the air that was put into your GI tract during the procedure and reduce the bloating. If you had a lower endoscopy (such as a colonoscopy or flexible sigmoidoscopy) you may notice spotting of blood in your stool or on the toilet paper. If you underwent a bowel prep for your procedure, you may not have a normal bowel movement for a few days.  Please Note:  You might notice some irritation and congestion in your nose or some drainage.  This is from the oxygen used during your procedure.  There is no need for concern and it should clear up in a day or so.  SYMPTOMS TO REPORT IMMEDIATELY:  Following lower endoscopy (colonoscopy or flexible sigmoidoscopy):  Excessive amounts of blood in the stool  Significant tenderness or worsening of abdominal pains  Swelling of the abdomen that is new, acute  Fever of 100F or higher  For urgent or emergent issues, a gastroenterologist can be reached at any hour by calling [phone]. Do not use MyChart messaging for urgent concerns.    DIET:  We do recommend a small meal at first, but then you may proceed to your regular diet.  Drink plenty of fluids but you should avoid  alcoholic beverages for 24 hours.  ACTIVITY:  You should plan to take it easy for the rest of today and you should NOT DRIVE or use heavy machinery until tomorrow (because of the sedation medicines used during the test).    FOLLOW UP: Our staff will call the number listed on your records the next business day following your procedure.  We will call around 7:15- 8:00 am to check on you and address any questions or concerns that you may have regarding the information given to you following your procedure. If we do not reach you, we will leave a message.  If you develop any symptoms (ie: fever, flu-like symptoms, shortness of breath, cough etc.) before then, please call [phone].  If you test positive for Covid 19 in the 2 weeks post procedure, please call and report this information to [place].    If any biopsies were taken you will be contacted by phone or by letter within the next 1-3 weeks.  Please call us at [phone] if you have not heard about the biopsies in 3 weeks.    SIGNATURES/CONFIDENTIALITY: You and/or your care partner have signed paperwork which will be entered into your electronic medical record.  These signatures attest to the fact that that the information above on your After Visit Summary has been reviewed and is understood.  Full responsibility of the confidentiality of this discharge information lies with you and/or your care-partner.

## 2022-06-20 NOTE — Progress Notes (Signed)
Called to room to assist during endoscopic procedure.  Patient ID and intended procedure confirmed with present staff. Received instructions for my participation in the procedure from the performing physician.  

## 2022-06-20 NOTE — Progress Notes (Signed)
[facility] History and Physical   Primary Care Physician:  Ann Held, DO   Reason for Procedure:   Rectal bleeding, altered bowel habits  Plan:    Colonoscopy     HPI: Evelyn [name] is a 88 y.o. female  here for colonoscopy to evaluate rectal bleeding and altered bowel habits. She reports last exam a very long time ago. Marland KitchenNo family history of colon cancer known. Otherwise feels well without any cardiopulmonary symptoms. She has AF at baseline on Eliquis, has held it for 2 days. We have discussed risks  benefits of colonoscopy and anesthesia with her, especially at her age. She understands but wants to proceed given her persistent bowel symptoms and need for long term anticoagulation. Further recommendations pending results.   Past Medical History:  Diagnosis Date   Atrial fibrillation ([place])    Hyperlipidemia    Hypertension    Osteoporosis    Post-menopausal    HRT    Past Surgical History:  Procedure Laterality Date   ABDOMINAL HYSTERECTOMY     BREAST LUMPECTOMY  11/28/1973   EYE SURGERY     Cataract removal    Prior to Admission medications   Medication Sig Start Date End Date Taking? Authorizing Provider  amLODipine (NORVASC) 5 MG tablet TAKE 1 TABLET(5 MG) BY MOUTH DAILY 10/18/21  Yes Roma Schanz R, DO  calcium carbonate (OS-CAL) 600 MG TABS Take 600 mg by mouth daily.   Yes [provider]  Cholecalciferol (VITAMIN D PO) Take 1 capsule by mouth daily. 2000 units   Yes [provider]  glucosamine-chondroitin 500-400 MG tablet Take 1 tablet by mouth daily.    Yes [provider]  Latanoprostene Bunod 0.024 % SOLN Apply 1 drop to eye at bedtime.   Yes [provider]  metoprolol succinate (TOPROL-XL) 100 MG 24 hr tablet TAKE 1 TABLET(100 MG) BY MOUTH DAILY WITH OR IMMEDIATELY FOLLOWING A MEAL 11/01/21  Yes Wellington Hampshire, MD  NON FORMULARY Place 1 drop into the left eye daily. Visulta  1 drop in right  eye   Yes [provider]  psyllium (METAMUCIL) 58.6 % powder Take 1 packet by mouth daily.   Yes [provider]  Red Yeast Rice 600 MG TABS Take 1 tablet by mouth daily.   Yes [provider]  sodium chloride (MURO 128) 2 % ophthalmic solution Place 1 drop into both eyes as needed for eye irritation.   Yes [provider]  timolol (TIMOPTIC) 0.5 % ophthalmic solution 1 drop every morning. 08/25/21  Yes [provider]  apixaban (ELIQUIS) 2.5 MG TABS tablet TAKE 1 TABLET(2.5 MG) BY MOUTH TWICE DAILY 05/09/22   Wellington Hampshire, MD    Current Outpatient Medications  Medication Sig Dispense Refill   amLODipine (NORVASC) 5 MG tablet TAKE 1 TABLET(5 MG) BY MOUTH DAILY 90 tablet 3   calcium carbonate (OS-CAL) 600 MG TABS Take 600 mg by mouth daily.     Cholecalciferol (VITAMIN D PO) Take 1 capsule by mouth daily. 2000 units     glucosamine-chondroitin 500-400 MG tablet Take 1 tablet by mouth daily.      Latanoprostene Bunod 0.024 % SOLN Apply 1 drop to eye at bedtime.     metoprolol succinate (TOPROL-XL) 100 MG 24 hr tablet TAKE 1 TABLET(100 MG) BY MOUTH DAILY WITH OR IMMEDIATELY FOLLOWING A MEAL 90 tablet 2   NON FORMULARY Place 1 drop into the left eye daily. Visulta  1  drop in right eye     psyllium (METAMUCIL) 58.6 % powder Take 1 packet by mouth daily.     Red Yeast Rice 600 MG TABS Take 1 tablet by mouth daily.     sodium chloride (MURO 128) 2 % ophthalmic solution Place 1 drop into both eyes as needed for eye irritation.     timolol (TIMOPTIC) 0.5 % ophthalmic solution 1 drop every morning.     apixaban (ELIQUIS) 2.5 MG TABS tablet TAKE 1 TABLET(2.5 MG) BY MOUTH TWICE DAILY 180 tablet 1   Current Facility-Administered Medications  Medication Dose Route Frequency Provider Last Rate Last Admin   0.9 %  sodium chloride infusion  500 mL Intravenous Continuous Dodge Ator, Carlota Raspberry, MD        Allergies as of 06/20/2022 - Review Complete 06/20/2022   Allergen Reaction Noted   Vicodin [hydrocodone-acetaminophen] Other (See Comments) 06/21/2011   Pletaal [cilostazol] Diarrhea 12/24/2013   Statins  03/26/2019    Family History  Problem Relation Age of Onset   Arthritis Mother    Heart disease Mother    Heart attack Father 67   Prostate cancer Father    COPD Father    Glaucoma Father    Hypertension Sister    COPD Sister    Arthritis Sister     Social History   Socioeconomic History   Marital status: Widowed    Spouse name: Not on file   Number of children: Not on file   Years of education: Not on file   Highest education level: Not on file  Occupational History   Not on file  Tobacco Use   Smoking status: Former    Packs/day: 0.75    Years: 18.00    Total pack years: 13.50    Types: Cigarettes    Quit date: 02/20/1973    Years since quitting: 49.3   Smokeless tobacco: Never  Substance and Sexual Activity   Alcohol use: Yes   Drug use: No   Sexual activity: Yes    Partners: Male  Other Topics Concern   Not on file  Social History Narrative   Exercise--yard work, house work   Investment banker, operational of Radio broadcast assistant Strain: [place]  (12/28/2021)   Overall Financial Resource Strain (CARDIA)    Difficulty of Paying Living Expenses: Not very hard  Food Insecurity: No Food Insecurity (09/21/2021)   Hunger Vital Sign    Worried About Running Out of Food in the Last Year: Never true    [place] in the Last Year: Never true  Transportation Needs: No Transportation Needs (08/17/2021)   PRAPARE - Hydrologist (Medical): No    Lack of Transportation (Non-Medical): No  Physical Activity: Insufficiently Active (04/27/2022)   Exercise Vital Sign    Days of Exercise per Week: 3 days    Minutes of Exercise per Session: 10 min  Stress: No Stress Concern Present (09/21/2021)   [place]    Feeling of Stress :  Not at all  Social Connections: Moderately Integrated (09/21/2021)   Social Connection and Isolation Panel [NHANES]    Frequency of Communication with Friends and Family: More than three times a week    Frequency of Social Gatherings with Friends and Family: Once a week    Attends Religious Services: More than 4 times per year    Active Member of Clubs or Organizations: Yes    Attends  Club or Organization Meetings: More than 4 times per year    Marital Status: Widowed  Intimate Partner Violence: Not At Risk (09/21/2021)   Humiliation, Afraid, Rape, and Kick questionnaire    Fear of Current or Ex-Partner: No    Emotionally Abused: No    Physically Abused: No    Sexually Abused: No    Review of Systems: All other review of systems negative except as mentioned in the HPI.  Physical Exam: Vital signs BP (!) 155/80   Pulse 87   Temp (!) 96.6 F (35.9 C)   Ht '5\' 2"'$  (1.575 m)   Wt 125 lb (56.7 kg)   SpO2 93%   BMI 22.86 kg/m   General:   Alert,  Well-developed, pleasant and cooperative in NAD Lungs:  Clear throughout to auscultation.   Heart:  atrial fibrillation, rate controlled Abdomen:  Soft, nontender and nondistended.   Neuro/Psych:  Alert and cooperative. Normal mood and affect. A and O x 3  Jolly Mango, MD [facility]

## 2022-06-20 NOTE — Progress Notes (Signed)
Pt's states no medical or surgical changes since previsit or office visit. 

## 2022-06-20 NOTE — Op Note (Addendum)
[place] Patient Name: Cordell Coke Procedure Date: 06/20/2022 2:19 PM MRN: [id number] Endoscopist: Remo Lipps P. Havery Moros , MD Age: 88 Referring MD:  Date of Birth: 1930/04/23 Gender: Female Account #: [id number] Procedure:                Colonoscopy Indications:              Rectal bleeding, altered bowel habits Medicines:                Monitored Anesthesia Care Procedure:                Pre-Anesthesia Assessment:                           - Prior to the procedure, a History and Physical                            was performed, and patient medications and                            allergies were reviewed. The patient's tolerance of                            previous anesthesia was also reviewed. The risks                            and benefits of the procedure and the sedation                            options and risks were discussed with the patient.                            All questions were answered, and informed consent                            was obtained. Prior Anticoagulants: The patient has                            taken Eliquis (apixaban), last dose was 2 days                            prior to procedure. ASA Grade Assessment: III - A                            patient with severe systemic disease. After                            reviewing the risks and benefits, the patient was                            deemed in satisfactory condition to undergo the                            procedure.  After obtaining informed consent, the colonoscope                            was passed under direct vision. Throughout the                            procedure, the patient's blood pressure, pulse, and                            oxygen saturations were monitored continuously. The                            PCF-HQ190L Colonoscope was introduced through the                            anus and advanced to the the terminal ileum, with                             identification of the appendiceal orifice and IC                            valve. The colonoscopy was performed without                            difficulty. The patient tolerated the procedure                            well. The quality of the bowel preparation was                            good. The terminal ileum, ileocecal valve,                            appendiceal orifice, and rectum were photographed. Scope In: 2:27:27 PM Scope Out: 2:52:19 PM Scope Withdrawal Time: 0 hours 17 minutes 29 seconds  Total Procedure Duration: 0 hours 24 minutes 52 seconds  Findings:                 The perianal and digital rectal examinations were                            normal.                           The terminal ileum appeared normal.                           A single medium-sized angiodysplastic lesion was                            found at the ileocecal valve.                           Multiple small-mouthed diverticula were found in  the sigmoid colon.                           A 3 to 4 mm polyp was found in the transverse                            colon. The polyp was sessile. The polyp was removed                            with a cold snare. Resection and retrieval were                            complete.                           A 3 mm polyp was found in the descending colon. The                            polyp was sessile. The polyp was removed with a                            cold snare. Resection and retrieval were complete.                           A 4 mm polyp was found in the sigmoid colon. The                            polyp was sessile. The polyp was removed with a                            cold snare. Resection and retrieval were complete.                           A 5 to 6 mm polyp was found in the distal rectum,                            just proximal to the dentate line. The polyp was                             quite inflamed and ulcerated, and was sessile. The                            polyp was removed with a cold snare during cecal                            intubation. Resection and retrieval were complete.                            Upon withdrawal of the colonoscope there was a good                            clot on the area without  any bleeding. Of note, I                            thought a photo of this polyp was taken, but it was                            not.                           An area of mucosa was found in the distal rectum                            near the rectal polypectomy site that was slightly                            altered compared to the rest of the mucosa, in an                            area of several centimeters. I suspect benign                            hyperplastic change but biopsies were taken with a                            cold forceps for histology to ensure no flat                            adenoma.                           Internal hemorrhoids were found during retroflexion.                           The exam was otherwise without abnormality. Complications:            No immediate complications. Estimated blood loss:                            Minimal. Estimated Blood Loss:     Estimated blood loss was minimal. Impression:               - The examined portion of the ileum was normal.                           - A single colonic angiodysplastic lesion.                           - Diverticulosis in the sigmoid colon.                           - One 3 to 4 mm polyp in the transverse colon,                            removed with a cold snare. Resected and retrieved.                           -  One 3 mm polyp in the descending colon, removed                            with a cold snare. Resected and retrieved.                           - One 4 mm polyp in the sigmoid colon, removed with                            a cold snare. Resected and  retrieved.                           - One 5 to 6 mm inflamed / ulcerated polyp in the                            distal rectum, removed with a cold snare. Resected                            and retrieved.                           - Altered mucosa in the distal rectum as outlined                            above. Biopsied.                           - Internal hemorrhoids.                           - The examination was otherwise normal.                           Possible the ulcerated polyp in the distal rectum                            has contributed / caused rectal bleeding. Recommendation:           - Patient has a contact number available for                            emergencies. The signs and symptoms of potential                            delayed complications were discussed with the                            patient. Return to normal activities tomorrow.                            Written discharge instructions were provided to the                            patient.                           -  Resume previous diet.                           - Continue present medications.                           - Trial of Citrucel daily if not yet done                           - Resume Eliquis tomorrow                           - Await pathology results. Remo Lipps P. Seda Kronberg, MD 06/20/2022 3:01:17 PM This report has been signed electronically.

## 2022-06-21 ENCOUNTER — Telehealth: Payer: Self-pay

## 2022-06-21 NOTE — Telephone Encounter (Signed)
  Follow up Call-     06/20/2022    1:24 PM  Call back number  Post procedure Call Back phone  # [phone]  Permission to leave phone message Yes     Patient questions:  Do you have a fever, pain , or abdominal swelling? No. Pain Score  0 *  Have you tolerated food without any problems? Yes.    Have you been able to return to your normal activities? Yes.    Do you have any questions about your discharge instructions: Diet   No. Medications  No. Follow up visit  No.  Do you have questions or concerns about your Care? No.  Actions: * If pain score is 4 or above: No action needed, pain <4.

## 2022-06-24 ENCOUNTER — Other Ambulatory Visit: Payer: Self-pay | Admitting: Medical

## 2022-06-24 ENCOUNTER — Ambulatory Visit (INDEPENDENT_AMBULATORY_CARE_PROVIDER_SITE_OTHER): Payer: Medicare Other | Admitting: Medical

## 2022-06-24 VITALS — BP 150/80 | HR 80 | Temp 98.3°F | Ht 62.0 in | Wt 122.6 lb

## 2022-06-24 DIAGNOSIS — H6122 Impacted cerumen, left ear: Secondary | ICD-10-CM | POA: Diagnosis not present

## 2022-06-24 DIAGNOSIS — J3089 Other allergic rhinitis: Secondary | ICD-10-CM | POA: Diagnosis not present

## 2022-06-24 DIAGNOSIS — J0100 Acute maxillary sinusitis, unspecified: Secondary | ICD-10-CM

## 2022-06-24 MED ORDER — AZITHROMYCIN 250 MG PO TABS: ORAL_TABLET | ORAL | 0 refills | Status: AC

## 2022-06-24 MED ORDER — FLUTICASONE PROPIONATE 50 MCG/ACT NA SUSP: 2.0000 | Freq: Every day | NASAL | 1 refills | Status: DC

## 2022-06-24 NOTE — Patient Instructions (Addendum)
Allergic rhinitis type signs and symptoms recently. Considering air quality recently may be factor.  With 3 week duration and left side sinus pressure with ear clog sensation. Possible eustachian tube dysfunction.   Azithromycin antibiotic and flonase nasal spray.  Discussed your ear wax on exam. Offered lavage today but we decided against.   Advise can use debrox to soften wax and we can attempt wash out of wax next week wed-Friday if you decide to do procedure.

## 2022-06-24 NOTE — Progress Notes (Signed)
Subjective:    Patient ID: Evelyn [name], female    DOB: 04-22-1930, 88 y.o.   MRN: [id number]  HPI  Pt in for recent nasal congestion and cough for about 3 weeks. Along with this she states her left side of face feels pressure and her left ear blocked.   She tried to clear out wax but no significant amount came out.  This summer runny nose.   No fever, no chills or sweats.      Review of Systems  HENT:  Positive for congestion, sinus pressure and sinus pain. Negative for sore throat.        Ear clogged sensation.  Respiratory:  Positive for cough. Negative for choking and wheezing.   Cardiovascular:  Negative for chest pain and palpitations.  Gastrointestinal:  Negative for abdominal pain.  Genitourinary:  Negative for dyspareunia, dysuria, enuresis and frequency.  Musculoskeletal:  Negative for back pain and joint swelling.  Skin:  Negative for pallor and rash.  Neurological:  Negative for dizziness, seizures and headaches.  Hematological:  Negative for adenopathy. Does not bruise/bleed easily.  Psychiatric/Behavioral:  Negative for behavioral problems.     Past Medical History:  Diagnosis Date   Atrial fibrillation ([place])    Hyperlipidemia    Hypertension    Osteoporosis    Post-menopausal    HRT     Social History   Socioeconomic History   Marital status: Widowed    Spouse name: Not on file   Number of children: Not on file   Years of education: Not on file   Highest education level: Not on file  Occupational History   Not on file  Tobacco Use   Smoking status: Former    Packs/day: 0.75    Years: 18.00    Total pack years: 13.50    Types: Cigarettes    Quit date: 02/20/1973    Years since quitting: 49.3   Smokeless tobacco: Never  Substance and Sexual Activity   Alcohol use: Yes   Drug use: No   Sexual activity: Yes    Partners: Male  Other Topics Concern   Not on file  Social History Narrative   Exercise--yard work, house work   Animal nutritionist of Radio broadcast assistant Strain: [place]  (12/28/2021)   Overall Financial Resource Strain (CARDIA)    Difficulty of Paying Living Expenses: Not very hard  Food Insecurity: No Food Insecurity (09/21/2021)   Hunger Vital Sign    Worried About Running Out of Food in the Last Year: Never true    [place] in the Last Year: Never true  Transportation Needs: No Transportation Needs (08/17/2021)   PRAPARE - Hydrologist (Medical): No    Lack of Transportation (Non-Medical): No  Physical Activity: Insufficiently Active (04/27/2022)   Exercise Vital Sign    Days of Exercise per Week: 3 days    Minutes of Exercise per Session: 10 min  Stress: No Stress Concern Present (09/21/2021)   [place]    Feeling of Stress : Not at all  Social Connections: Moderately Integrated (09/21/2021)   Social Connection and Isolation Panel [NHANES]    Frequency of Communication with Friends and Family: More than three times a week    Frequency of Social Gatherings with Friends and Family: Once a week    Attends Religious Services: More than 4 times per year    Active  Member of Clubs or Organizations: Yes    Attends Music therapist: More than 4 times per year    Marital Status: Widowed  Intimate Partner Violence: Not At Risk (09/21/2021)   Humiliation, Afraid, Rape, and Kick questionnaire    Fear of Current or Ex-Partner: No    Emotionally Abused: No    Physically Abused: No    Sexually Abused: No    Past Surgical History:  Procedure Laterality Date   ABDOMINAL HYSTERECTOMY     BREAST LUMPECTOMY  11/28/1973   EYE SURGERY     Cataract removal    Family History  Problem Relation Age of Onset   Arthritis Mother    Heart disease Mother    Heart attack Father 51   Prostate cancer Father    COPD Father    Glaucoma Father    Hypertension Sister    COPD Sister     Arthritis Sister     Allergies  Allergen Reactions   Vicodin [Hydrocodone-Acetaminophen] Other (See Comments)    Abdominal Pain   Pletaal [Cilostazol] Diarrhea   Statins     Current Outpatient Medications on File Prior to Visit  Medication Sig Dispense Refill   amLODipine (NORVASC) 5 MG tablet TAKE 1 TABLET(5 MG) BY MOUTH DAILY 90 tablet 3   apixaban (ELIQUIS) 2.5 MG TABS tablet TAKE 1 TABLET(2.5 MG) BY MOUTH TWICE DAILY 180 tablet 1   calcium carbonate (OS-CAL) 600 MG TABS Take 600 mg by mouth daily.     Cholecalciferol (VITAMIN D PO) Take 1 capsule by mouth daily. 2000 units     glucosamine-chondroitin 500-400 MG tablet Take 1 tablet by mouth daily.      Latanoprostene Bunod 0.024 % SOLN Apply 1 drop to eye at bedtime.     metoprolol succinate (TOPROL-XL) 100 MG 24 hr tablet TAKE 1 TABLET(100 MG) BY MOUTH DAILY WITH OR IMMEDIATELY FOLLOWING A MEAL 90 tablet 2   NON FORMULARY Place 1 drop into the left eye daily. Visulta  1 drop in right eye     psyllium (METAMUCIL) 58.6 % powder Take 1 packet by mouth daily.     Red Yeast Rice 600 MG TABS Take 1 tablet by mouth daily.     sodium chloride (MURO 128) 2 % ophthalmic solution Place 1 drop into both eyes as needed for eye irritation.     timolol (TIMOPTIC) 0.5 % ophthalmic solution 1 drop every morning.     No current facility-administered medications on file prior to visit.    BP (!) 150/80   Pulse 80   Temp 98.3 F (36.8 C)   Ht '5\' 2"'$  (1.575 m)   Wt 122 lb 9.6 oz (55.6 kg)   SpO2 98%   BMI 22.42 kg/m        Objective:   Physical Exam  General- No acute distress. Pleasant patient. Neck- Full range of motion, no jvd Lungs- Clear, even and unlabored. Heart- regular rate and rhythm. Neurologic- CNII- XII grossly intact.  Heent- left side faint maxillary sinus pressure. Left ear- moderate wax obstruction. Canal not swollen.  Boggy turbinates and pnd. Mouth- pnd.      Assessment & Plan:   Patient Instructions   Allergic rhinitis type signs and symptoms recently. Considering air quality recently may be factor.  With 3 week duration and left side sinus pressure with ear clog sensation. Possible eustachian tube dysfunction.   Azithromycin antibiotic and flonase nasal spray.  Discussed your ear wax on exam. Offered lavage today  but we decided against.   Advise can use debrox to soften wax and we can attempt wash out of wax next week wed-Friday if you decide to do procedure.    Mackie Pai, PA-C

## 2022-07-13 DIAGNOSIS — H401122 Primary open-angle glaucoma, left eye, moderate stage: Secondary | ICD-10-CM | POA: Diagnosis not present

## 2022-07-13 DIAGNOSIS — H401111 Primary open-angle glaucoma, right eye, mild stage: Secondary | ICD-10-CM | POA: Diagnosis not present

## 2022-07-13 DIAGNOSIS — Z961 Presence of intraocular lens: Secondary | ICD-10-CM | POA: Diagnosis not present

## 2022-07-15 ENCOUNTER — Encounter: Payer: Self-pay | Admitting: Family Medicine

## 2022-07-15 ENCOUNTER — Ambulatory Visit (INDEPENDENT_AMBULATORY_CARE_PROVIDER_SITE_OTHER): Payer: Medicare Other | Admitting: Family Medicine

## 2022-07-15 VITALS — BP 122/86 | HR 86 | Temp 98.0°F | Resp 18 | Ht 62.0 in | Wt 121.0 lb

## 2022-07-15 DIAGNOSIS — H6692 Otitis media, unspecified, left ear: Secondary | ICD-10-CM

## 2022-07-15 MED ORDER — CEFDINIR 300 MG PO CAPS: 300.0000 mg | ORAL_CAPSULE | Freq: Two times a day (BID) | ORAL | 0 refills | Status: DC

## 2022-07-15 NOTE — Patient Instructions (Signed)

## 2022-07-15 NOTE — Progress Notes (Signed)
Established Patient Office Visit  Subjective   Patient ID: Evelyn [name], female    DOB: 01/02/1930  Age: 88 y.o. MRN: [id number]  Chief Complaint  Patient presents with   Sinus Problem    Pt states sxs started 6 weeks ago. Pt states using Allerga and no COVID test.     HPI Pt finished abx and feels no better.   L side of her face and ear feels clogged.  No fevers  No cough, no fever   Patient Active Problem List   Diagnosis Date Noted   Change in bowel function 02/01/2022   Hyperlipidemia 04/14/2020   Seasonal allergies 11/27/2019   Atrial fibrillation ([place]) 03/26/2019   Anticoagulated 03/26/2019   Loose stools 04/14/2015   Osteoarthritis 04/14/2015   Chronic flank pain 06/17/2013   Plantar fasciitis 05/25/2013   PAD (peripheral artery disease) ([place]) 03/07/2013   Pain in joint, lower leg 02/05/2013   CARDIAC MURMUR 06/29/2010   VARICOSE VEINS, LOWER EXTREMITIES 06/02/2010   PLANTAR WART, LEFT 04/03/2009   MOLE 04/03/2009   HIP PAIN, RIGHT 04/03/2009   Dyslipidemia, goal LDL below 70 06/29/2007   ARTIFICIAL MENOPAUSE 06/29/2007   Pain in limb 06/29/2007   BREAST BIOPSY, HX OF 06/29/2007   GLAUCOMA, LEFT EYE 04/27/2007   Essential hypertension 04/27/2007   Osteoporosis 04/27/2007   Past Medical History:  Diagnosis Date   Atrial fibrillation ([place])    Hyperlipidemia    Hypertension    Osteoporosis    Post-menopausal    HRT   Past Surgical History:  Procedure Laterality Date   ABDOMINAL HYSTERECTOMY     BREAST LUMPECTOMY  11/28/1973   EYE SURGERY     Cataract removal   Social History   Tobacco Use   Smoking status: Former    Packs/day: 0.75    Years: 18.00    Total pack years: 13.50    Types: Cigarettes    Quit date: 02/20/1973    Years since quitting: 49.4   Smokeless tobacco: Never  Substance Use Topics   Alcohol use: Yes   Drug use: No   Social History   Socioeconomic History   Marital status: Widowed    Spouse name: Not on file   Number of  children: Not on file   Years of education: Not on file   Highest education level: Not on file  Occupational History   Not on file  Tobacco Use   Smoking status: Former    Packs/day: 0.75    Years: 18.00    Total pack years: 13.50    Types: Cigarettes    Quit date: 02/20/1973    Years since quitting: 49.4   Smokeless tobacco: Never  Substance and Sexual Activity   Alcohol use: Yes   Drug use: No   Sexual activity: Yes    Partners: Male  Other Topics Concern   Not on file  Social History Narrative   Exercise--yard work, house work   Investment banker, operational of Radio broadcast assistant Strain: [place]  (12/28/2021)   Overall Financial Resource Strain (CARDIA)    Difficulty of Paying Living Expenses: Not very hard  Food Insecurity: No Food Insecurity (09/21/2021)   Hunger Vital Sign    Worried About Running Out of Food in the Last Year: Never true    [place] in the Last Year: Never true  Transportation Needs: No Transportation Needs (08/17/2021)   PRAPARE - Hydrologist (Medical): No  Lack of Transportation (Non-Medical): No  Physical Activity: Insufficiently Active (04/27/2022)   Exercise Vital Sign    Days of Exercise per Week: 3 days    Minutes of Exercise per Session: 10 min  Stress: No Stress Concern Present (09/21/2021)   [place]    Feeling of Stress : Not at all  Social Connections: Moderately Integrated (09/21/2021)   Social Connection and Isolation Panel [NHANES]    Frequency of Communication with Friends and Family: More than three times a week    Frequency of Social Gatherings with Friends and Family: Once a week    Attends Religious Services: More than 4 times per year    Active Member of [employer] or Organizations: Yes    Attends Archivist Meetings: More than 4 times per year    Marital Status: Widowed  Intimate Partner Violence: Not At Risk  (09/21/2021)   Humiliation, Afraid, Rape, and Kick questionnaire    Fear of Current or Ex-Partner: No    Emotionally Abused: No    Physically Abused: No    Sexually Abused: No   Family Status  Relation Name Status   Mother  Deceased at age 65   Father  Deceased at age 58   Sister  58   Brother  Deceased at age 51       lung cancer---nonsmoker   Sister  42   Family History  Problem Relation Age of Onset   Arthritis Mother    Heart disease Mother    Heart attack Father 53   Prostate cancer Father    COPD Father    Glaucoma Father    Hypertension Sister    COPD Sister    Arthritis Sister    Allergies  Allergen Reactions   Vicodin [Hydrocodone-Acetaminophen] Other (See Comments)    Abdominal Pain   Pletaal [Cilostazol] Diarrhea   Statins       Review of Systems  Constitutional:  Negative for fever and malaise/fatigue.  HENT:  Positive for congestion, ear pain and sinus pain.   Eyes:  Negative for blurred vision.  Respiratory:  Negative for cough and shortness of breath.   Cardiovascular:  Negative for chest pain, palpitations and leg swelling.  Gastrointestinal:  Negative for vomiting.  Musculoskeletal:  Negative for back pain.  Skin:  Negative for rash.  Neurological:  Negative for loss of consciousness and headaches.      Objective:     BP 122/86 (BP Location: Right Arm, Patient Position: Sitting, Cuff Size: Normal)   Pulse 86   Temp 98 F (36.7 C) (Oral)   Resp 18   Ht '5\' 2"'$  (1.575 m)   Wt 121 lb (54.9 kg)   SpO2 97%   BMI 22.13 kg/m  BP Readings from Last 3 Encounters:  07/15/22 122/86  06/24/22 (!) 150/80  06/20/22 (!) 140/97   Wt Readings from Last 3 Encounters:  07/15/22 121 lb (54.9 kg)  06/24/22 122 lb 9.6 oz (55.6 kg)  06/20/22 125 lb (56.7 kg)   SpO2 Readings from Last 3 Encounters:  07/15/22 97%  06/24/22 98%  06/20/22 99%      Physical Exam Vitals and nursing note reviewed.  Constitutional:      Appearance: She is not  diaphoretic.  HENT:     Right Ear: Tympanic membrane, ear canal and external ear normal.     Left Ear: There is impacted cerumen.     Ears:     Comments:  R tm --- impacted ---- unable to use hoop, ear irrigated without complications t Tm red, dull,  + fluid     Nose: Mucosal edema present. No nasal deformity or rhinorrhea.     Right Sinus: Maxillary sinus tenderness and frontal sinus tenderness present.     Left Sinus: Maxillary sinus tenderness and frontal sinus tenderness present.     Mouth/Throat:     Pharynx: No oropharyngeal exudate.  Cardiovascular:     Rate and Rhythm: Normal rate and regular rhythm.     Heart sounds: Normal heart sounds. No murmur heard. Pulmonary:     Effort: Pulmonary effort is normal. No respiratory distress.     Breath sounds: Normal breath sounds. No rales.  Musculoskeletal:     Cervical back: Normal range of motion and neck supple.  Lymphadenopathy:     Cervical: No cervical adenopathy.  Skin:    General: Skin is warm.  Neurological:     Mental Status: She is alert and oriented to person, place, and time.     No results found for any visits on 07/15/22.  Last CBC Lab Results  Component Value Date   WBC 6.6 02/01/2022   HGB 14.0 02/01/2022   HCT 42.2 02/01/2022   MCV 94.2 02/01/2022   MCH 30.3 07/23/2019   RDW 13.8 02/01/2022   PLT 175.0 50/07/3817   Last metabolic panel Lab Results  Component Value Date   GLUCOSE 86 02/01/2022   NA 138 02/01/2022   K 4.6 02/01/2022   CL 98 02/01/2022   CO2 31 02/01/2022   BUN 14 02/01/2022   CREATININE 0.73 02/01/2022   GFRNONAA 67 07/23/2019   CALCIUM 9.9 02/01/2022   PROT 8.0 02/01/2022   ALBUMIN 4.3 02/01/2022   BILITOT 1.0 02/01/2022   ALKPHOS 66 02/01/2022   AST 20 02/01/2022   ALT 16 02/01/2022   Last lipids Lab Results  Component Value Date   CHOL 208 (H) 02/01/2022   HDL 65.90 02/01/2022   LDLCALC 119 (H) 02/01/2022   LDLDIRECT 130.5 03/26/2013   TRIG 113.0 02/01/2022    CHOLHDL 3 02/01/2022   Last hemoglobin A1c No results found for: "HGBA1C" Last thyroid functions Lab Results  Component Value Date   TSH 1.99 02/01/2022   Last vitamin D Lab Results  Component Value Date   VD25OH 61 06/29/2010   Last vitamin B12 and Folate No results found for: "VITAMINB12", "FOLATE"    The ASCVD Risk score (Arnett DK, et al., 2019) failed to calculate for the following reasons:   The 2019 ASCVD risk score is only valid for ages 64 to 44    Assessment & Plan:   Problem List Items Addressed This Visit   None Visit Diagnoses     Left otitis media, unspecified otitis media type    -  Primary   Relevant Medications   cefdinir (OMNICEF) 300 MG capsule     Con't flonase and antihistamine Take abx and will recheck at [facility]  Return if symptoms worsen or fail to improve.    Ann Held, DO

## 2022-07-27 ENCOUNTER — Ambulatory Visit (INDEPENDENT_AMBULATORY_CARE_PROVIDER_SITE_OTHER): Payer: Medicare Other | Admitting: Pharmacist

## 2022-07-27 DIAGNOSIS — I4821 Permanent atrial fibrillation: Secondary | ICD-10-CM

## 2022-07-27 DIAGNOSIS — I739 Peripheral vascular disease, unspecified: Secondary | ICD-10-CM

## 2022-07-27 DIAGNOSIS — I10 Essential (primary) hypertension: Secondary | ICD-10-CM

## 2022-07-27 DIAGNOSIS — E785 Hyperlipidemia, unspecified: Secondary | ICD-10-CM

## 2022-07-27 NOTE — Chronic Care Management (AMB) (Signed)
Chronic Care Management Pharmacy Note  07/27/2022 Name:  Evelyn [name] MRN:  [id number] DOB:  09-05-1930  Summary:  Reviewed medications and updated med list.   Hypertension- Last blood pressure was at goal.  Hyperlipidemia: LDL not at goal. Unable to tolerate statin. Continue Red Yeast Rice. Daily. Reminded to annual flu vaccine and Tetanus booster.  Reminded patient due cardio follow up in September 2023 (looks like recall is in EMR)  Patient has reached coverage gap for 2023 yet. Discussed applying for medication assistance program for Eliquis. Patient declined medication assistance program.  Subjective: Evelyn [name] is an 88 y.o. year old female who is a primary patient of Ann Held, DO.  The CCM team was consulted for assistance with disease management and care coordination needs.    Engaged with patient by telephone for follow up visit in response to provider referral for pharmacy case management and/or care coordination services.   Consent to Services:  The patient was given information about Chronic Care Management services, agreed to services, and gave verbal consent prior to initiation of services.  Please see initial visit note for detailed documentation.   Patient Care Team: Carollee Herter, Alferd Apa, DO as PCP - General Wellington Hampshire, MD as PCP - Cardiology (Cardiology) Jerline Pain Mingo Amber, DO as Consulting Physician (Osteopathic Medicine) Cherre Robins, RPH-CPP (Pharmacist) Allyn Kenner, MD (Dermatology)  Recent office visits: 07/15/2022 - Fam Med [place], [place], [place]) seen for left otitis media. Prescribed cefdinir 336m twice a day [facility] left [facility]. Azithromycin antibiotic and flonase nasal spray. Discussed your ear wax on exam. Offered lavage today but we decided against. Advise can use debrox to soften wax and we can attempt wash out of wax next week wed-Friday if you decide to do procedure 02/01/2022 - Fam  Med (Dr LEtter SjogrenCheri Rous complete physical. noted change in bowel function. Referred to GI. No med changes noted.    Recent consult visits: 06/20/2022 - Colonscopy Several polyps removed. Altered mucosa in the distal rectum - Biopsied. Also noted internal hemorrhoids. 05/17/2022 - GI (Duenther, NP) Seen for rectal bleeding and altered bowel habits. Scheduled colonoscopy. Hold Eliquis 2 days prior to procedure 03/15/2022 - GI (Chester Holstein NP) Seen for change in bowel habits. Recommended she start daily Metamucil to improve stook consistency. Increase water intake to 48 ounces daily. F/U in 4 weeks.  08/24/2021 - Cardio (Dr AFletcher Anon. Seen for HTN, PAD and permenant a fib. No med changes noted.   Hospital visits: None in previous 6 months  Objective:  Lab Results  Component Value Date   CREATININE 0.73 02/01/2022   CREATININE 0.72 04/15/2021   CREATININE 0.65 10/08/2019    No results found for: "HGBA1C" Last diabetic Eye exam: No results found for: "HMDIABEYEEXA"  Last diabetic Foot exam: No results found for: "HMDIABFOOTEX"      Component Value Date/Time   CHOL 208 (H) 02/01/2022 1336   TRIG 113.0 02/01/2022 1336   HDL 65.90 02/01/2022 1336   CHOLHDL 3 02/01/2022 1336   VLDL 22.6 02/01/2022 1336   LDLCALC 119 (H) 02/01/2022 1336   LDLDIRECT 130.5 03/26/2013 0916       Latest Ref Rng & Units 02/01/2022    1:36 PM 04/15/2021    1:50 PM 10/08/2019    1:50 PM  Hepatic Function  Total Protein 6.0 - 8.3 g/dL 8.0  7.9  7.8   Albumin 3.5 - 5.2 g/dL 4.3  4.4  4.3   AST 0 - 37 U/L '20  26  22   ' ALT 0 - 35 U/L '16  25  19   ' Alk Phosphatase 39 - 117 U/L 66  67  62   Total Bilirubin 0.2 - 1.2 mg/dL 1.0  1.0  0.9     Lab Results  Component Value Date/Time   TSH 1.99 02/01/2022 01:36 PM   TSH 3.12 03/12/2020 03:20 PM       Latest Ref Rng & Units 02/01/2022    1:36 PM 10/06/2020   10:11 AM 05/21/2020    3:39 PM  CBC  WBC 4.0 - 10.5 K/uL 6.6  6.8  6.9   Hemoglobin 12.0 - 15.0 g/dL 14.0   14.8  13.8   Hematocrit 36.0 - 46.0 % 42.2  44.5  40.9   Platelets 150.0 - 400.0 K/uL 175.0  221.0  188.0     Lab Results  Component Value Date/Time   VD25OH 61 06/29/2010 12:00 AM   VD25OH 54 03/25/2009 08:44 PM    Clinical ASCVD: Yes  - PAD The ASCVD Risk score (Arnett DK, et al., 2019) failed to calculate for the following reasons:   The 2019 ASCVD risk score is only valid for ages 62 to 50    Other: CHADS2VASc = 5  Social History   Tobacco Use  Smoking Status Former   Packs/day: 0.75   Years: 18.00   Total pack years: 13.50   Types: Cigarettes   Quit date: 02/20/1973   Years since quitting: 49.4  Smokeless Tobacco Never   BP Readings from Last 3 Encounters:  07/15/22 122/86  06/24/22 (!) 150/80  06/20/22 (!) 140/97   Pulse Readings from Last 3 Encounters:  07/15/22 86  06/24/22 80  06/20/22 80   Wt Readings from Last 3 Encounters:  07/15/22 121 lb (54.9 kg)  06/24/22 122 lb 9.6 oz (55.6 kg)  06/20/22 125 lb (56.7 kg)    Assessment: Review of patient past medical history, allergies, medications, health status, including review of consultants reports, laboratory and other test data, was performed as part of comprehensive evaluation and provision of chronic care management services.   SDOH:  (Social Determinants of Health) assessments and interventions performed:  SDOH Interventions    Flowsheet Row Most Recent Value  SDOH Interventions   Financial Strain Interventions Patient Refused  [place] is in Medicare Coverage gap but states cost of Eliquis is affordable,  declined PAP application]        CCM Care Plan  Allergies  Allergen Reactions   Vicodin [Hydrocodone-Acetaminophen] Other (See Comments)    Abdominal Pain   Pletaal [Cilostazol] Diarrhea   Statins     Medications Reviewed Today     Reviewed by Cherre Robins, RPH-CPP (Pharmacist) on 07/27/22 at 1506  Med List Status: <None>   Medication Order Taking? Sig Documenting Provider Last Dose  Status Informant  amLODipine (NORVASC) 5 MG tablet [id number] Yes TAKE 1 TABLET(5 MG) BY MOUTH DAILY Carollee Herter, Alferd Apa, DO Taking Active   apixaban (ELIQUIS) 2.5 MG TABS tablet [id number] Yes TAKE 1 TABLET(2.5 MG) BY MOUTH TWICE DAILY Wellington Hampshire, MD Taking Active   calcium carbonate (OS-CAL) 600 MG TABS [id number] Yes Take 600 mg by mouth daily. [provider] Taking Active   Cholecalciferol (VITAMIN D PO) [id number] Yes Take 1 capsule by mouth daily. 2000 units [provider] Taking Active   fluticasone (FLONASE) 50 MCG/ACT nasal spray [id number] Yes SHAKE LIQUID AND USE 2  SPRAYS IN [facility] NOSTRIL DAILY Saguier, Iris Pert Taking Active   glucosamine-chondroitin 500-400 MG tablet [id number] Yes Take 1 tablet by mouth daily.  [provider] Taking Active   Latanoprostene Bunod 0.024 % SOLN [id number] Yes Apply 1 drop to eye at bedtime. [provider] Taking Active   metoprolol succinate (TOPROL-XL) 100 MG 24 hr tablet [id number] Yes TAKE 1 TABLET(100 MG) BY MOUTH DAILY WITH OR IMMEDIATELY FOLLOWING A MEAL Wellington Hampshire, MD Taking Active   NON FORMULARY [id number] Yes Place 1 drop into the left eye daily. Visulta  1 drop in right eye [provider] Taking Active   psyllium (METAMUCIL) 58.6 % powder [id number] Yes Take 1 packet by mouth daily. [provider] Taking Active   Red Yeast Rice 600 MG TABS [id number] Yes Take 1 tablet by mouth daily. [provider] Taking Active   sodium chloride (MURO 128) 2 % ophthalmic solution [id number] Yes Place 1 drop into both eyes as needed for eye irritation. [provider] Taking Active   timolol (TIMOPTIC) 0.5 % ophthalmic solution [id number] Yes 1 drop every morning. [provider] Taking Active             Patient Active Problem List   Diagnosis Date Noted   Change in bowel function 02/01/2022   Hyperlipidemia 04/14/2020   Seasonal allergies 11/27/2019   Atrial  fibrillation ([place]) 03/26/2019   Anticoagulated 03/26/2019   Loose stools 04/14/2015   Osteoarthritis 04/14/2015   Chronic flank pain 06/17/2013   Plantar fasciitis 05/25/2013   PAD (peripheral artery disease) ([place]) 03/07/2013   Pain in joint, lower leg 02/05/2013   CARDIAC MURMUR 06/29/2010   VARICOSE VEINS, LOWER EXTREMITIES 06/02/2010   PLANTAR WART, LEFT 04/03/2009   MOLE 04/03/2009   HIP PAIN, RIGHT 04/03/2009   Dyslipidemia, goal LDL below 70 06/29/2007   ARTIFICIAL MENOPAUSE 06/29/2007   Pain in limb 06/29/2007   BREAST BIOPSY, HX OF 06/29/2007   GLAUCOMA, LEFT EYE 04/27/2007   Essential hypertension 04/27/2007   Osteoporosis 04/27/2007    Immunization History  Administered Date(s) Administered   Fluad Quad(high Dose 65+) 10/08/2019, 10/06/2020, 10/07/2021   Influenza Split 09/15/2011, 09/20/2012   Influenza Whole 11/28/2000, 10/10/2007, 08/20/2008, 09/24/2009, 08/25/2010   Influenza, High Dose Seasonal PF 10/01/2015, 09/29/2016, 09/04/2018   Influenza,inj,Quad PF,6+ Mos 08/15/2013, 10/07/2014   PFIZER Comirnaty(Gray Top)Covid-19 Tri-Sucrose Vaccine 04/15/2021   PFIZER(Purple Top)SARS-COV-2 Vaccination 01/09/2020, 02/16/2020, 09/16/2020   Pneumococcal Conjugate-13 10/07/2014   Pneumococcal Polysaccharide-23 11/28/2000   Td 11/28/2000   Tdap 08/05/2011   Zoster Recombinat (Shingrix) 04/23/2019, 06/22/2019   Zoster, Live 12/28/2007    Conditions to be addressed/monitored: Atrial Fibrillation, HTN, HLD and PAD; osteoarthritis; glaucoma; osteoporosis  Care Plan : General Pharmacy (Adult)  Updates made by Cherre Robins, RPH-CPP since 07/27/2022 12:00 AM     Problem: Medication and Chronic care management, education and care coordination   Priority: High  Onset Date: 04/16/2021  Note:   Current Barriers:  Unable to achieve control of hyperlipidemia  Chronic Disease Management support, education, and care coordination needs related to Atrial Fib, Hypertension,  Hyperlipidemia, Peripheral Artery Disease, Osteoporosis, Glaucoma, Chronic Diarrhea Usually reaches Medicare coverage gap - cost of Eliquis increases significantly  Pharmacist Clinical Goal(s):  Over the next 180 days, patient will achieve control of lipids as evidenced by LDL <70 contact provider office for questions/concerns as evidenced notation of same in electronic health record through collaboration with PharmD and provider.   Interventions: 1:1 collaboration with Carollee Herter, Alferd Apa,  DO regarding development and update of comprehensive plan of care as evidenced by provider attestation and co-signature Inter-disciplinary care team collaboration (see longitudinal plan of care) Comprehensive medication review performed; medication list updated in electronic medical record   Hypertension Currently at goal BP of <140/90 Current regimen:  Amlodipine 64m daily Metoprolol succinate 1032mdaily Interventions: Maintain hypertension medication regimen Discussed blood pressure goals  Hyperlipidemia / PAD Lab Results  Component Value Date/Time   LDLCALC 122 (H) 04/15/2021   LDLCACL 117 10/06/2020  Not at LDL goal; LDL goal < 70 Intolerant to statins Current regimen:  Red yeast rice 6003maily Interventions: Reviewed recent lipid panel results; Discussed LDL goal; She has great HDL and Tg at goal of <150 Discussed limiting intake of saturated and trans fat Maintain cholesterol medication regimen.  Encouraged patient to try to walk or use stationary bike for exercise - start with 10 to 15 minutes per day and work up as able.  Loose Stools Improved since GI recommended psyllium / fiber powder Colonoscopy 2023 FOBT negative 05/18/2021 Current regimen:  Fiber powder daily  Interventions:  Maintain bowel medication regimen. Follow up with gastroenterologist as recommended.    Osteoporosis Last DEXA was in 2014;  Lowest T-Score was -2.7 at neck of left hip. Noted no significant  change since previous DEXA in 2012. No history of fractures One fall in the last year with no injury; patient tripped over garden hose in yard; had sore tailbone. Current regimen:  Calcium 600m71mily Vitamin D 2000 units daily Interventions:  Continue daily intake of 1200mg81mcium through diet and/or supplementation Consider repeat DEXA (noting patient may not want to begin treatment for osteoporosis) Fall assessment completed and discuss fall prevention  Atrial Fibrillation:  Patient denies dizziness or rapid HR CHADS2VASc = 5 Current regimen:  Eliquis 2.5mg t67me a day Metoprolol ER 100mg d52m   Interventions: Continue current regimen Financial assessment: She usually reaches Medicare Coverage Gap in Fall each year. Today she declined patient assistance program application for Eliquis. Patient will notify office is she would like to apply.  Medication management Pharmacist Clinical Goal(s): Over the next 90 days, patient will work with PharmD and providers to maintain optimal medication adherence Current pharmacy: [facility] Interventions Comprehensive medication review performed. Continue current medication management strategy Adherence reviewed;  Coordinated with office staff for follow up appt with PCP.  Patient Goals/Self-Care Activities Over the next 180 days, patient will:  take medications as prescribed,  collaborate with provider on medication access solutions, and  engage in dietary modifications by limiting intake of fried foods, animal products and saturated and trans fat. Try to either walk or use stationary bike for exercise - start with 10 to 15 minutes per day and work up as able.  Follow Up Plan: 3 to 4 months.       Medication Assistance:  Patient has reached Medicare coverage gap, however she reports cost is affordable and declined application for medication assistance program for Eliquis .   Patient's preferred pharmacy is:  [facility] [address] [address] [id number] [place] [phone]entonTORE #[id number] [place] OF [facility] Center[facility] POSalemABenton City[place] [phone]  Follow Up:  Patient agrees to Care Plan and Follow-up.  Plan: Telephone follow up appointment with care management team member scheduled for:  f/u with clinical pharmacist in 3 to 4 months  Cherre Robins, PharmD Clinical Pharmacist [facility]

## 2022-07-27 NOTE — Patient Instructions (Signed)
Evelyn [name] It was a pleasure speaking with you today.  Below is a summary of your health goals and summary of our recent visit. You can also view your updated Chronic Care Management Care plan through your MyChart account.   Patient Goals/Self-Care Activities Over the next 180 days, patient will:  take medications as prescribed,  collaborate with provider on medication access solutions, and  engage in dietary modifications by limiting intake of fried foods, animal products and saturated and trans fat. Try to either walk or use stationary bike for exercise - start with 10 to 15 minutes per day and work up as able.  Follow Up Plan: No further follow up required: at this time. Patient Is aware she can contact Clinical Pharmacist Practitioner in PCP office if you has any medication related questions or needs.   As always if you have any questions or concerns especially regarding medications, please feel free to contact me either at the phone number below or with a MyChart message.   Keep up the good work!  Cherre Robins, PharmD Clinical Pharmacist [place] [phone] (direct line)  [phone] (main office number)   The patient verbalized understanding of instructions, educational materials, and care plan provided today and agreed to receive a mailed copy of patient instructions, educational materials, and care plan.

## 2022-07-28 DIAGNOSIS — I739 Peripheral vascular disease, unspecified: Secondary | ICD-10-CM | POA: Diagnosis not present

## 2022-07-28 DIAGNOSIS — E785 Hyperlipidemia, unspecified: Secondary | ICD-10-CM

## 2022-07-28 DIAGNOSIS — I10 Essential (primary) hypertension: Secondary | ICD-10-CM | POA: Diagnosis not present

## 2022-07-28 DIAGNOSIS — M810 Age-related osteoporosis without current pathological fracture: Secondary | ICD-10-CM

## 2022-09-20 ENCOUNTER — Encounter: Payer: Self-pay | Admitting: Gastroenterology

## 2022-09-20 ENCOUNTER — Ambulatory Visit: Payer: Medicare Other | Admitting: Gastroenterology

## 2022-09-20 VITALS — BP 110/82 | HR 86 | Ht 62.0 in | Wt 121.0 lb

## 2022-09-20 DIAGNOSIS — R194 Change in bowel habit: Secondary | ICD-10-CM | POA: Diagnosis not present

## 2022-09-20 DIAGNOSIS — K648 Other hemorrhoids: Secondary | ICD-10-CM

## 2022-09-20 DIAGNOSIS — Z86010 Personal history of colonic polyps: Secondary | ICD-10-CM

## 2022-09-20 NOTE — Progress Notes (Signed)
HPI :  88 year old female here for follow-up visit for reassessment of bowel habit changes.  Recall she was seen in the office back in June with altered bowel function and intermittent rectal bleeding that had been persistent.  She underwent a colonoscopy with me in July, had an inflammatory polyp removed from her distal rectum that was ulcerated, as well as internal hemorrhoids noted, suspected superficial bleeding was coming from 1 of these entities in the setting of anticoagulation.  She had some other smaller benign polyps removed as well.  No high risk lesions.  Recommended she go on a daily fiber supplementation to provide some regularity to her bowels and she has done this, using equate psyllium husk.  She reports doing quite well since her procedure.  Her bleeding has resolved, she is not seeing this at all anymore.  She states taking the fiber supplement daily has provided good regularity for her bowels and she no longer has the urgency and regularity that she had before.  She is taking Eliquis and states she is compliant with that and no bleeding symptoms that she is aware of.  Otherwise denies any complaints today.  Colonoscopy 06/20/22: - The examined portion of the ileum was normal. - A single colonic angiodysplastic lesion. - Diverticulosis in the sigmoid colon. - One 3 to 4 mm polyp in the transverse colon, removed with a cold snare. Resected and retrieved. - One 3 mm polyp in the descending colon, removed with a cold snare. Resected and retrieved. - One 4 mm polyp in the sigmoid colon, removed with a cold snare. Resected and retrieved. - One 5 to 6 mm inflamed / ulcerated polyp in the distal rectum, removed with a cold snare. Resected and retrieved. - Altered mucosa in the distal rectum as outlined above. Biopsied. - Internal hemorrhoids. - The examination was otherwise normal.  1. Surgical [P], colon, rectum, polyp (1) - POLYPOID FRAGMENTS OF COLONIC MUCOSA WITH SUPERFICIAL  ULCERATION AND GRANULATION TISSUE, SMOOTH MUSCLE HYPERPLASIA AND HYPERPLASTIC CHANGE CONSISTENT WITH PROLAPSE TYPE CHANGE. 2. Surgical [P], colon, transverse, descending, sigmoid, polyp (3) - TUBULAR ADENOMA, FRAGMENTS. - SESSILE SERRATED POLYP WITHOUT DYSPLASIA 3. Surgical [P], colon, rectum, polyp (1) - HYPERPLASTIC POLYP.  Placed on Citrucel once daily    Past Medical History:  Diagnosis Date   Atrial fibrillation ([place])    Hyperlipidemia    Hypertension    Osteoporosis    Post-menopausal    HRT     Past Surgical History:  Procedure Laterality Date   ABDOMINAL HYSTERECTOMY     BREAST LUMPECTOMY  11/28/1973   EYE SURGERY     Cataract removal   Family History  Problem Relation Age of Onset   Arthritis Mother    Heart disease Mother    Heart attack Father 18   Prostate cancer Father    COPD Father    Glaucoma Father    Hypertension Sister    COPD Sister    Arthritis Sister    Social History   Tobacco Use   Smoking status: Former    Packs/day: 0.75    Years: 18.00    Total pack years: 13.50    Types: Cigarettes    Quit date: 02/20/1973    Years since quitting: 49.6   Smokeless tobacco: Never  Substance Use Topics   Alcohol use: Yes   Drug use: No   Current Outpatient Medications  Medication Sig Dispense Refill   amLODipine (NORVASC) 5 MG tablet TAKE 1 TABLET(5 MG) BY MOUTH  DAILY 90 tablet 3   apixaban (ELIQUIS) 2.5 MG TABS tablet TAKE 1 TABLET(2.5 MG) BY MOUTH TWICE DAILY 180 tablet 1   calcium carbonate (OS-CAL) 600 MG TABS Take 600 mg by mouth daily.     Cholecalciferol (VITAMIN D PO) Take 1 capsule by mouth daily. 2000 units     glucosamine-chondroitin 500-400 MG tablet Take 1 tablet by mouth daily.      Latanoprostene Bunod 0.024 % SOLN Apply 1 drop to eye at bedtime.     metoprolol succinate (TOPROL-XL) 100 MG 24 hr tablet TAKE 1 TABLET(100 MG) BY MOUTH DAILY WITH OR IMMEDIATELY FOLLOWING A MEAL 90 tablet 2   NON FORMULARY Place 1 drop into the left  eye daily. Visulta  1 drop in right eye     psyllium (METAMUCIL) 58.6 % powder Take 1 packet by mouth daily.     Red Yeast Rice 600 MG TABS Take 1 tablet by mouth daily.     sodium chloride (MURO 128) 2 % ophthalmic solution Place 1 drop into both eyes as needed for eye irritation.     timolol (TIMOPTIC) 0.5 % ophthalmic solution 1 drop every morning.     fluticasone (FLONASE) 50 MCG/ACT nasal spray SHAKE LIQUID AND USE 2 SPRAYS IN EACH NOSTRIL DAILY (Patient not taking: Reported on 09/20/2022) 48 g 0   No current facility-administered medications for this visit.   Allergies  Allergen Reactions   Vicodin [Hydrocodone-Acetaminophen] Other (See Comments)    Abdominal Pain   Pletaal [Cilostazol] Diarrhea   Statins      Review of Systems: All systems reviewed and negative except where noted in HPI.   Lab Results  Component Value Date   WBC 6.6 02/01/2022   HGB 14.0 02/01/2022   HCT 42.2 02/01/2022   MCV 94.2 02/01/2022   PLT 175.0 02/01/2022    Lab Results  Component Value Date   CREATININE 0.73 02/01/2022   BUN 14 02/01/2022   NA 138 02/01/2022   K 4.6 02/01/2022   CL 98 02/01/2022   CO2 31 02/01/2022    Lab Results  Component Value Date   ALT 16 02/01/2022   AST 20 02/01/2022   ALKPHOS 66 02/01/2022   BILITOT 1.0 02/01/2022     Physical Exam: BP 110/82   Pulse 86   Ht '5\' 2"'$  (1.575 m)   Wt 121 lb (54.9 kg)   BMI 22.13 kg/m  Constitutional: Pleasant,well-developed, female in no acute distress. Neurological: Alert and oriented to person place and time. Skin: Skin is warm and dry. No rashes noted. Psychiatric: Normal mood and affect. Behavior is normal.   ASSESSMENT: 88 y.o. female here for assessment of the following  1. Altered bowel habits   2. History of colon polyps   3. Internal hemorrhoid    Colonoscopy as above, removed and ulcerated/inflamed distal rectal polyp, unclear if this was the source of her bleeding symptoms versus internal hemorrhoids.   She has done well since her colonoscopy without any recurrent bleeding despite continuing her anticoagulation.  Daily fiber supplementation has provided good regularity for her bowel habits and this is also good for her hemorrhoids.  She will continue this daily.  Given her age and no high risk lesions on her last exam, there are no plans for surveillance colonoscopy moving forward.  She can follow-up with me as needed if she has any recurrent symptoms.  All questions answered she agrees with the plan.  PLAN: - continue fiber supplement daily - follow up  PRN, no further planned colonoscopies  Jolly Mango, MD [facility]

## 2022-09-20 NOTE — Patient Instructions (Signed)
If you are age 86 or older, your body mass index should be between 23-30. Your Body mass index is 22.13 kg/m. If this is out of the aforementioned range listed, please consider follow up with your Primary Care Provider.  If you are age 45 or younger, your body mass index should be between 19-25. Your Body mass index is 22.13 kg/m. If this is out of the aformentioned range listed, please consider follow up with your Primary Care Provider.   ________________________________________________________   Continue fiber daily.  Thank you for entrusting me with your care and for choosing [facility], Dr. Freeland Cellar

## 2022-09-27 ENCOUNTER — Encounter: Payer: Self-pay | Admitting: Cardiovascular Disease

## 2022-09-27 ENCOUNTER — Ambulatory Visit: Payer: Medicare Other | Attending: Cardiovascular Disease | Admitting: Cardiovascular Disease

## 2022-09-27 VITALS — BP 134/72 | HR 73 | Ht 62.0 in | Wt 122.2 lb

## 2022-09-27 DIAGNOSIS — E785 Hyperlipidemia, unspecified: Secondary | ICD-10-CM

## 2022-09-27 DIAGNOSIS — I739 Peripheral vascular disease, unspecified: Secondary | ICD-10-CM

## 2022-09-27 DIAGNOSIS — I10 Essential (primary) hypertension: Secondary | ICD-10-CM

## 2022-09-27 DIAGNOSIS — I4821 Permanent atrial fibrillation: Secondary | ICD-10-CM | POA: Diagnosis not present

## 2022-09-27 MED ORDER — APIXABAN 2.5 MG PO TABS: ORAL_TABLET | ORAL | 11 refills | Status: DC

## 2022-09-27 NOTE — Patient Instructions (Signed)
Medication Instructions:  No changes *If you need a refill on your cardiac medications before your next appointment, please call your pharmacy*   Lab Work: None ordered If you have labs (blood work) drawn today and your tests are completely normal, you will receive your results only by: [place] (if you have MyChart) OR A paper copy in the mail If you have any lab test that is abnormal or we need to change your treatment, we will call you to review the results.   Testing/Procedures: None ordered   Follow-Up: At [facility], you and your health needs are our priority.  As part of our continuing mission to provide you with exceptional heart care, we have created designated Provider Care Teams.  These Care Teams include your primary Cardiologist (physician) and Advanced Practice Providers (APPs -  Physician Assistants and Nurse Practitioners) who all work together to provide you with the care you need, when you need it.  We recommend signing up for the patient portal called "MyChart".  Sign up information is provided on this After Visit Summary.  MyChart is used to connect with patients for Virtual Visits (Telemedicine).  Patients are able to view lab/test results, encounter notes, upcoming appointments, etc.  Non-urgent messages can be sent to your provider as well.   To learn more about what you can do with MyChart, go to [email or web address].    Your next appointment:   12 month(s)  The format for your next appointment:   In Person  Provider:   [name], MD      Important Information About Sugar

## 2022-09-27 NOTE — Progress Notes (Signed)
Cardiology Office Note   Date:  09/27/2022   ID:  Evelyn [name], DOB 1930-11-11, MRN [id number]  PCP:  Carollee Herter, Alferd Apa, DO  Cardiologist:   [name], MD   No chief complaint on file.      History of Present Illness: Evelyn [name] is a 88 y.o. female who presents for a followup visit regarding peripheral arterial disease and atrial fibrillation.   She has chronic medical conditions that include essential hypertension, hyperlipidemia and previous tobacco use.  She is known to have peripheral arterial disease with atypical claudication.  Noninvasive vascular studies done in 2014 showed focal stenosis of the left SFA with mildly reduced ABI. She is intolerant to statins.  She was diagnosed with atrial fibrillation in 2020.  She was started on Xarelto for anticoagulation.  She had an echocardiogram which showed normal LV systolic function, moderately dilated left atrium and no significant valvular abnormalities.  She had hematuria on Xarelto and was switched to Eliquis with subsequent improvement.   She has been doing very well with no chest pain, shortness of breath or palpitations.  No lower extremity claudication.  No side effects with anticoagulation.  She does have lower extremity varicose veins and these have been stable.   Past Medical History:  Diagnosis Date   Atrial fibrillation ([place])    Hyperlipidemia    Hypertension    Osteoporosis    Post-menopausal    HRT    Past Surgical History:  Procedure Laterality Date   ABDOMINAL HYSTERECTOMY     BREAST LUMPECTOMY  11/28/1973   EYE SURGERY     Cataract removal     Current Outpatient Medications  Medication Sig Dispense Refill   amLODipine (NORVASC) 5 MG tablet TAKE 1 TABLET(5 MG) BY MOUTH DAILY 90 tablet 3   calcium carbonate (OS-CAL) 600 MG TABS Take 600 mg by mouth daily.     Cholecalciferol (VITAMIN D PO) Take 1 capsule by mouth daily. 2000 units     fluticasone (FLONASE) 50 MCG/ACT nasal spray SHAKE  LIQUID AND USE 2 SPRAYS IN EACH NOSTRIL DAILY 48 g 0   glucosamine-chondroitin 500-400 MG tablet Take 1 tablet by mouth daily.      Latanoprostene Bunod 0.024 % SOLN Apply 1 drop to eye at bedtime.     metoprolol succinate (TOPROL-XL) 100 MG 24 hr tablet TAKE 1 TABLET(100 MG) BY MOUTH DAILY WITH OR IMMEDIATELY FOLLOWING A MEAL 90 tablet 2   NON FORMULARY Place 1 drop into the left eye daily. Visulta  1 drop in right eye     psyllium (METAMUCIL) 58.6 % powder Take 1 packet by mouth daily.     Red Yeast Rice 600 MG TABS Take 1 tablet by mouth daily.     sodium chloride (MURO 128) 2 % ophthalmic solution Place 1 drop into both eyes as needed for eye irritation.     timolol (TIMOPTIC) 0.5 % ophthalmic solution 1 drop every morning.     apixaban (ELIQUIS) 2.5 MG TABS tablet TAKE 1 TABLET(2.5 MG) BY MOUTH TWICE DAILY 30 tablet 11   No current facility-administered medications for this visit.    Allergies:   Vicodin [hydrocodone-acetaminophen], Pletaal [cilostazol], and Statins    Social History:  The patient  reports that she quit smoking about 49 years ago. Her smoking use included cigarettes. She has a 13.50 pack-year smoking history. She has never used smokeless tobacco. She reports current alcohol use. She reports that she does not use  drugs.   Family History:  The patient's family history includes Arthritis in her mother and sister; COPD in her father and sister; Glaucoma in her father; Heart attack (age of onset: 25) in her father; Heart disease in her mother; Hypertension in her sister; Prostate cancer in her father.      PHYSICAL EXAM: VS:  BP 134/72 (BP Location: Left Arm, Patient Position: Sitting, Cuff Size: Normal)   Pulse 73   Ht '5\' 2"'$  (1.575 m)   Wt 122 lb 3.2 oz (55.4 kg)   SpO2 90%   BMI 22.35 kg/m  , BMI Body mass index is 22.35 kg/m. GEN: Well nourished, well developed, in no acute distress  HEENT: normal  Neck: no JVD, carotid bruits, or masses Cardiac: Irregularly  irregular; no murmurs, rubs, or gallops,no edema  Respiratory:  clear to auscultation bilaterally, normal work of breathing GI: soft, nontender, nondistended, + BS MS: no deformity or atrophy  Skin: warm and dry, no rash Neuro:  Strength and sensation are intact Psych: euthymic mood, full affect Distal pulses are not palpable.   EKG:  EKG is  ordered today. EKG showed atrial fibrillation with ventricular rate of 73 bpm.  Recent Labs: 02/01/2022: ALT 16; BUN 14; Creatinine, Ser 0.73; Hemoglobin 14.0; Platelets 175.0; Potassium 4.6; Sodium 138; TSH 1.99    Lipid Panel    Component Value Date/Time   CHOL 208 (H) 02/01/2022 1336   TRIG 113.0 02/01/2022 1336   HDL 65.90 02/01/2022 1336   CHOLHDL 3 02/01/2022 1336   VLDL 22.6 02/01/2022 1336   LDLCALC 119 (H) 02/01/2022 1336   LDLDIRECT 130.5 03/26/2013 0916      Wt Readings from Last 3 Encounters:  09/27/22 122 lb 3.2 oz (55.4 kg)  09/20/22 121 lb (54.9 kg)  07/15/22 121 lb (54.9 kg)        ASSESSMENT AND PLAN:  1.  Permanent atrial fibrillation: Chads Vasc score is 5.  She is tolerating anticoagulation with Eliquis.  Rate is well controlled with Toprol 100 mg once daily.  I reviewed her labs done in March which showed normal CBC and normal renal function.  2. PAD: Currently with minimal claudication.  Continue medical therapy.  2.  Hyperlipidemia: She is intolerant to statins and has been on red yeast Rice.  Most recent lipid profile showed an LDL of 119.  3. Essential Hypertension: Blood pressure is well controlled on current medications.   Disposition:   FU in 12 months.  Signed, [name], MD  09/27/2022 1:30 PM    [place]

## 2022-10-10 ENCOUNTER — Telehealth: Payer: Self-pay | Admitting: Cardiovascular Disease

## 2022-10-10 ENCOUNTER — Other Ambulatory Visit: Payer: Self-pay | Admitting: Family Medicine

## 2022-10-10 DIAGNOSIS — I10 Essential (primary) hypertension: Secondary | ICD-10-CM

## 2022-10-10 DIAGNOSIS — I4821 Permanent atrial fibrillation: Secondary | ICD-10-CM

## 2022-10-10 NOTE — Telephone Encounter (Signed)
*  STAT* If patient is at the pharmacy, call can be transferred to refill team.   1. Which medications need to be refilled? (please list name of each medication and dose if known) apixaban (ELIQUIS) 2.5 MG TABS tablet   2. Which pharmacy/location (including street and city if local pharmacy) is medication to be sent to?  [id number] - [place], [place] - [address] AT [address]    3. Do they need a 30 day or 90 day supply? [place]

## 2022-10-11 MED ORDER — APIXABAN 2.5 MG PO TABS: ORAL_TABLET | ORAL | 1 refills | Status: DC

## 2022-10-11 NOTE — Telephone Encounter (Signed)
Eliquis 2.'5mg'$  refill request received. Patient is 88 years old, weight-55.4kg, Crea-0.73 on 02/01/2022, Diagnosis-Afib, and last seen by Dr. Fletcher Anon on 09/27/2022. Dose is appropriate based on dosing criteria. Will send in refill to requested pharmacy.

## 2022-10-17 ENCOUNTER — Encounter: Payer: Self-pay | Admitting: Cardiovascular Disease

## 2022-10-17 NOTE — Telephone Encounter (Signed)
error 

## 2022-11-11 ENCOUNTER — Ambulatory Visit (INDEPENDENT_AMBULATORY_CARE_PROVIDER_SITE_OTHER): Payer: Medicare Other | Admitting: Pharmacist

## 2022-11-11 DIAGNOSIS — E785 Hyperlipidemia, unspecified: Secondary | ICD-10-CM

## 2022-11-11 DIAGNOSIS — I4821 Permanent atrial fibrillation: Secondary | ICD-10-CM

## 2022-11-11 DIAGNOSIS — I10 Essential (primary) hypertension: Secondary | ICD-10-CM

## 2022-11-11 NOTE — Progress Notes (Signed)
Pharmacy Note  11/11/2022 Name: Evelyn [name] MRN: [id number] DOB: 14-Jul-1930  Subjective: Evelyn [name] is a 88 y.o. year old female who is a primary care patient of Carollee Herter, Alferd Apa, DO. Clinical Pharmacist Practitioner referral was placed to assist with medication management.    Engaged with patient by telephone for follow up visit today.  Hypertension; Office blood pressure has been at goal. Patient is not checking blood pressure at home.  Hyperlipidemia: Statin intolerant. Taking Red Yeast Rice daily.  Afib: well controlled. Taking Eliquis for stroke prevention and metoprolol for HR control.   Patient's granddaughter is a Careers adviser. She has her on a routine of lifting small weights and riding stationary bike.   SDOH (Social Determinants of Health) assessments and interventions performed:  SDOH Interventions    Flowsheet Row Chronic Care Management from 07/27/2022 in [facility] at [place] Management from 04/27/2022 in [place] at [place] Management from 12/28/2021 in [facility] at [place] from 09/21/2021 in [place] at [place] Management from 08/17/2021 in [place] at [place] Interventions       Transportation Interventions -- -- -- -- Intervention Not Indicated  Financial Strain Interventions Patient [place] is in Medicare Coverage gap but states cost of Eliquis is affordable,  declined PAP application] -- Other (Comment)  [patient did not complete PAP forms for 2022 but will follow up for 2023 and once reaches OOP spend will offer to assists with applying for PAP if patient is interested.] -- Other (Comment)  [discussed Eliquis patient assistance. Patient given information regarding income requirement and OOP expense requirement.]  Physical  Activity Interventions -- Intervention Not Indicated  [discussed increasing frequency and length of exericse.] -- Intervention Not Indicated  [pt has set a goal to increase activity] --        Objective: Review of patient status, including review of consultants reports, laboratory and other test data, was performed as part of comprehensive.  Lab Results  Component Value Date   CREATININE 0.73 02/01/2022   CREATININE 0.72 04/15/2021   CREATININE 0.65 10/08/2019    No results found for: "HGBA1C"     Component Value Date/Time   CHOL 208 (H) 02/01/2022 1336   TRIG 113.0 02/01/2022 1336   HDL 65.90 02/01/2022 1336   CHOLHDL 3 02/01/2022 1336   VLDL 22.6 02/01/2022 1336   LDLCALC 119 (H) 02/01/2022 1336   LDLDIRECT 130.5 03/26/2013 0916     Clinical ASCVD: No  The ASCVD Risk score (Arnett DK, et al., 2019) failed to calculate for the following reasons:   The 2019 ASCVD risk score is only valid for ages 63 to 69    BP Readings from Last 3 Encounters:  09/27/22 134/72  09/20/22 110/82  07/15/22 122/86     Allergies  Allergen Reactions   Vicodin [Hydrocodone-Acetaminophen] Other (See Comments)    Abdominal Pain   Pletaal [Cilostazol] Diarrhea   Statins     Medications Reviewed Today     Reviewed by Cherre Robins, RPH-CPP (Pharmacist) on 11/11/22 at 33  Med List Status: <None>   Medication Order Taking? Sig Documenting Provider Last Dose Status Informant  amLODipine (NORVASC) 5 MG tablet [id number] Yes TAKE 1 TABLET(5 MG) BY MOUTH DAILY Carollee Herter, Alferd Apa, DO Taking Active   apixaban (ELIQUIS) 2.5 MG  TABS tablet [id number] Yes TAKE 1 TABLET(2.5 MG) BY MOUTH TWICE DAILY Wellington Hampshire, MD Taking Active   calcium carbonate (OS-CAL) 600 MG TABS [id number] Yes Take 600 mg by mouth daily. [provider] Taking Active   Cholecalciferol (VITAMIN D PO) [id number] Yes Take 1 capsule by mouth daily. 2000 units [provider] Taking Active   fluticasone  (FLONASE) 50 MCG/ACT nasal spray [id number] No SHAKE LIQUID AND USE 2 SPRAYS IN [facility] NOSTRIL DAILY  Patient not taking: Reported on 11/11/2022   Mackie Pai, PA-C Not Taking Active   glucosamine-chondroitin 500-400 MG tablet [id number] Yes Take 1 tablet by mouth daily.  [provider] Taking Active   Latanoprostene Bunod 0.024 % SOLN [id number] Yes Apply 1 drop to eye at bedtime. [provider] Taking Active            Med Note Antony Contras, Carma Dwiggins B   Fri Nov 11, 2022  2:35 PM) Vyzulta eye drops  metoprolol succinate (TOPROL-XL) 100 MG 24 hr tablet [id number] Yes TAKE 1 TABLET(100 MG) BY MOUTH DAILY WITH OR IMMEDIATELY FOLLOWING A MEAL Arida, Mertie Clause, MD Taking Active   psyllium (METAMUCIL) 58.6 % powder [id number] Yes Take 1 packet by mouth daily. [provider] Taking Active   Red Yeast Rice 600 MG TABS [id number] Yes Take 1 tablet by mouth daily. [provider] Taking Active   sodium chloride (MURO 128) 2 % ophthalmic solution [id number] Yes Place 1 drop into both eyes as needed for eye irritation. [provider] Taking Active   timolol (TIMOPTIC) 0.5 % ophthalmic solution [id number] Yes 1 drop every morning. [provider] Taking Active             Patient Active Problem List   Diagnosis Date Noted   Change in bowel function 02/01/2022   Hyperlipidemia 04/14/2020   Seasonal allergies 11/27/2019   Atrial fibrillation ([place]) 03/26/2019   Anticoagulated 03/26/2019   Loose stools 04/14/2015   Osteoarthritis 04/14/2015   Chronic flank pain 06/17/2013   Plantar fasciitis 05/25/2013   PAD (peripheral artery disease) ([place]) 03/07/2013   Pain in joint, lower leg 02/05/2013   CARDIAC MURMUR 06/29/2010   VARICOSE VEINS, LOWER EXTREMITIES 06/02/2010   PLANTAR WART, LEFT 04/03/2009   MOLE 04/03/2009   HIP PAIN, RIGHT 04/03/2009   Dyslipidemia, goal LDL below 70 06/29/2007   ARTIFICIAL MENOPAUSE 06/29/2007   Pain in limb 06/29/2007    BREAST BIOPSY, HX OF 06/29/2007   GLAUCOMA, LEFT EYE 04/27/2007   Essential hypertension 04/27/2007   Osteoporosis 04/27/2007     Medication Assistance:  None required.  Patient affirms current coverage meets needs.   Assessment / Plan: Hypertension: Controlled Continue amlodipine '5mg'$  daily and metoprolol sucinate '100mg'$  daily.   Hyperlipidemia: Statin intolerant.  Continue Red Yeast Rice daily.   Afib: well controlled.  Continue Eliquis 2.5 mg twice a day for stroke prevention and metoprolol for HR control.   Medication management:  Reviewed and updated medication list Reviewed refill history and adherence  Health Maintenance:  Reminded to get Tetanus vaccine Called [facility] to get information about 2023 influenza vaccine. (Patient received Fluad HD flu vaccine 10/03/2022)  Follow Up:  Patient requested follow up in 4 months   Cherre Robins, PharmD Clinical Pharmacist [facility] [phone]

## 2023-01-10 ENCOUNTER — Other Ambulatory Visit: Payer: Self-pay | Admitting: Cardiovascular Disease

## 2023-01-10 DIAGNOSIS — I4821 Permanent atrial fibrillation: Secondary | ICD-10-CM

## 2023-01-10 NOTE — Telephone Encounter (Signed)
Please review

## 2023-01-10 NOTE — Telephone Encounter (Signed)
Eliquis 2.87m refill request received. Patient is 89years old, weight-55.4kg, Crea-0.73 on 02/01/22, Diagnosis-Afib, and last seen by Dr. AFletcher Anonon 09/27/2022. Dose is appropriate based on dosing criteria. Will send in refill to requested pharmacy.

## 2023-01-18 DIAGNOSIS — Z961 Presence of intraocular lens: Secondary | ICD-10-CM | POA: Diagnosis not present

## 2023-01-18 DIAGNOSIS — H401122 Primary open-angle glaucoma, left eye, moderate stage: Secondary | ICD-10-CM | POA: Diagnosis not present

## 2023-01-18 DIAGNOSIS — H401111 Primary open-angle glaucoma, right eye, mild stage: Secondary | ICD-10-CM | POA: Diagnosis not present

## 2023-01-23 ENCOUNTER — Other Ambulatory Visit: Payer: Self-pay | Admitting: Cardiovascular Disease

## 2023-01-23 DIAGNOSIS — I10 Essential (primary) hypertension: Secondary | ICD-10-CM

## 2023-01-23 NOTE — Telephone Encounter (Signed)
Refill request

## 2023-02-22 ENCOUNTER — Ambulatory Visit (INDEPENDENT_AMBULATORY_CARE_PROVIDER_SITE_OTHER): Payer: Medicare Other | Admitting: *Deleted

## 2023-02-22 DIAGNOSIS — Z0000 Encounter for general adult medical examination without abnormal findings: Secondary | ICD-10-CM | POA: Diagnosis not present

## 2023-02-22 NOTE — Progress Notes (Signed)
Subjective:   Evelyn [name] is a 89 y.o. female who presents for Medicare Annual (Subsequent) preventive examination.  I connected with  [name] on 02/22/23 by a audio enabled telemedicine application and verified that I am speaking with the correct person using two identifiers.  Patient Location: Home  Provider Location: Office/Clinic  I discussed the limitations of evaluation and management by telemedicine. The patient expressed understanding and agreed to proceed.   Review of Systems     Cardiac Risk Factors include: advanced age (>57men, >7 women);hypertension;dyslipidemia     Objective:    There were no vitals filed for this visit. There is no height or weight on file to calculate BMI.     02/22/2023    2:22 PM 09/21/2021    9:46 AM 03/31/2016   11:42 AM 12/29/2015    4:01 PM  Advanced Directives  Does Patient Have a Medical Advance Directive? Yes Yes Yes No  Type of Paramedic of [place];Living will [place];Living will [place];Living will   Does patient want to make changes to medical advance directive? No - Patient declined  No - Patient declined   Copy of [place] in Chart? No - copy requested No - copy requested No - copy requested   Would patient like information on creating a medical advance directive?    No - patient declined information    Current Medications (verified) Outpatient Encounter Medications as of 02/22/2023  Medication Sig   amLODipine (NORVASC) 5 MG tablet TAKE 1 TABLET(5 MG) BY MOUTH DAILY   calcium carbonate (OS-CAL) 600 MG TABS Take 600 mg by mouth daily.   Cholecalciferol (VITAMIN D PO) Take 1 capsule by mouth daily. 2000 units   ELIQUIS 2.5 MG TABS tablet TAKE 1 TABLET BY MOUTH TWICE DAILY   fluticasone (FLONASE) 50 MCG/ACT nasal spray SHAKE LIQUID AND USE 2 SPRAYS IN EACH NOSTRIL DAILY (Patient not taking: Reported on 11/11/2022)    glucosamine-chondroitin 500-400 MG tablet Take 1 tablet by mouth daily.    Latanoprostene Bunod 0.024 % SOLN Apply 1 drop to eye at bedtime.   metoprolol succinate (TOPROL-XL) 100 MG 24 hr tablet TAKE 1 TABLET(100 MG) BY MOUTH DAILY WITH OR IMMEDIATELY FOLLOWING A MEAL   psyllium (METAMUCIL) 58.6 % powder Take 1 packet by mouth daily.   Red Yeast Rice 600 MG TABS Take 1 tablet by mouth daily.   timolol (TIMOPTIC) 0.5 % ophthalmic solution 1 drop every morning.   [DISCONTINUED] sodium chloride (MURO 128) 2 % ophthalmic solution Place 1 drop into both eyes as needed for eye irritation.   No facility-administered encounter medications on file as of 02/22/2023.    Allergies (verified) Vicodin [hydrocodone-acetaminophen], Pletaal [cilostazol], and Statins   History: Past Medical History:  Diagnosis Date   Atrial fibrillation ([place])    Hyperlipidemia    Hypertension    Osteoporosis    Post-menopausal    HRT   Past Surgical History:  Procedure Laterality Date   ABDOMINAL HYSTERECTOMY     BREAST LUMPECTOMY  11/28/1973   EYE SURGERY     Cataract removal   Family History  Problem Relation Age of Onset   Arthritis Mother    Heart disease Mother    Heart attack Father 84   Prostate cancer Father    COPD Father    Glaucoma Father    Hypertension Sister    COPD Sister    Arthritis Sister  Social History   Socioeconomic History   Marital status: Widowed    Spouse name: Not on file   Number of children: Not on file   Years of education: Not on file   Highest education level: Not on file  Occupational History   Not on file  Tobacco Use   Smoking status: Former    Packs/day: 0.75    Years: 18.00    Additional pack years: 0.00    Total pack years: 13.50    Types: Cigarettes    Quit date: 02/20/1973    Years since quitting: 50.0   Smokeless tobacco: Never  Substance and Sexual Activity   Alcohol use: Yes   Drug use: No   Sexual activity: Yes    Partners: Male  Other  Topics Concern   Not on file  Social History Narrative   Exercise--yard work, house work   Investment banker, operational of Radio broadcast assistant Strain: [place]  (07/27/2022)   Overall Financial Resource Strain (CARDIA)    Difficulty of Paying Living Expenses: Not very hard  Food Insecurity: No Food Insecurity (02/22/2023)   Hunger Vital Sign    Worried About Running Out of Food in the Last Year: Never true    [place] in the Last Year: Never true  Transportation Needs: No Transportation Needs (02/22/2023)   PRAPARE - Hydrologist (Medical): No    Lack of Transportation (Non-Medical): No  Physical Activity: Insufficiently Active (04/27/2022)   Exercise Vital Sign    Days of Exercise per Week: 3 days    Minutes of Exercise per Session: 10 min  Stress: No Stress Concern Present (09/21/2021)   [place]    Feeling of Stress : Not at all  Social Connections: Moderately Integrated (02/22/2023)   Social Connection and Isolation Panel [NHANES]    Frequency of Communication with Friends and Family: More than three times a week    Frequency of Social Gatherings with Friends and Family: Once a week    Attends Religious Services: More than 4 times per year    Active Member of [employer] or Organizations: Yes    Attends Archivist Meetings: More than 4 times per year    Marital Status: Widowed    Tobacco Counseling Counseling given: Not Answered   Clinical Intake:  Pre-visit preparation completed: Yes  Pain : No/denies pain     Nutritional Risks: None Diabetes: No  How often do you need to have someone help you when you read instructions, pamphlets, or other written materials from your doctor or pharmacy?: 1 - Never  Activities of Daily Living    02/22/2023    2:41 PM  In your present state of health, do you have any difficulty performing the following activities:  Hearing?  0  Vision? 0  Difficulty concentrating or making decisions? 0  Walking or climbing stairs? 0  Dressing or bathing? 0  Doing errands, shopping? 0  Preparing Food and eating ? N  Using the Toilet? N  In the past six months, have you accidently leaked urine? Y  Do you have problems with loss of bowel control? N  Managing your Medications? N  Managing your Finances? N  Housekeeping or managing your Housekeeping? N    Patient Care Team: Carollee Herter, Alferd Apa, DO as PCP - General Wellington Hampshire, MD as PCP - Cardiology (Cardiology) Shawnie Dapper, DO as Consulting Physician (  Osteopathic Medicine) Cherre Robins, RPH-CPP (Pharmacist) Allyn Kenner, MD (Dermatology)  Indicate any recent Medical Services you may have received from other than [facility] providers in the past year (date may be approximate).     Assessment:   This is a routine wellness examination for Evelyn [name].  Hearing/Vision screen No results found.  Dietary issues and exercise activities discussed: Current Exercise Habits: The patient does not participate in regular exercise at present, Exercise limited by: cardiac condition(s)   Goals Addressed   None    Depression Screen    02/22/2023    2:37 PM 09/21/2021    9:50 AM 04/15/2021    1:24 PM 10/08/2019    1:15 PM 09/04/2018   11:06 AM 03/31/2016   11:43 AM 11/11/2015    1:13 PM  PHQ 2/9 Scores  PHQ - 2 Score 0 0 0 0 0 0 0    Fall Risk    02/22/2023    2:24 PM 04/27/2022    2:57 PM 09/21/2021    9:49 AM 04/16/2021   11:13 AM 10/06/2020    9:18 AM  Fall Risk   Falls in the past year? 1 1 0 1 0  Comment tripped over hose to leaf blower      Number falls in past yr: 0 1 0 0 0  Injury with Fall? 0 0 0 0 0  Comment    just a sore tailbone   Risk for fall due to : History of fall(s) History of fall(s);Impaired balance/gait;Impaired vision  History of fall(s);Impaired vision   Follow up Falls evaluation completed Falls evaluation completed;Falls prevention discussed  Falls prevention discussed Falls prevention discussed Falls evaluation completed    FALL RISK PREVENTION PERTAINING TO THE HOME:  Any stairs in or around the home? Yes  If so, are there any without handrails? No  Home free of loose throw rugs in walkways, pet beds, electrical cords, etc? Yes  Adequate lighting in your home to reduce risk of falls? Yes   ASSISTIVE DEVICES UTILIZED TO PREVENT FALLS:  Life alert? No  Use of a cane, walker or w/c? No  Grab bars in the bathroom? Yes  Shower chair or bench in shower? No  Elevated toilet seat or a handicapped toilet? No   TIMED UP AND GO:  Was the test performed?  No, audio visit .    Cognitive Function:    02/22/2023    2:57 PM  MMSE - Mini Mental State Exam  Not completed: Unable to complete        Immunizations Immunization History  Administered Date(s) Administered   Fluad Quad(high Dose 65+) 10/08/2019, 10/06/2020, 10/07/2021, 10/03/2022   Influenza Split 09/15/2011, 09/20/2012   Influenza Whole 11/28/2000, 10/10/2007, 08/20/2008, 09/24/2009, 08/25/2010   Influenza, High Dose Seasonal PF 10/01/2015, 09/29/2016, 09/04/2018   Influenza,inj,Quad PF,6+ Mos 08/15/2013, 10/07/2014   PFIZER Comirnaty(Gray Top)Covid-19 Tri-Sucrose Vaccine 04/15/2021   PFIZER(Purple Top)SARS-COV-2 Vaccination 01/09/2020, 02/16/2020, 09/16/2020   Pneumococcal Conjugate-13 10/07/2014   Pneumococcal Polysaccharide-23 11/28/2000   Td 11/28/2000   Tdap 08/05/2011   Zoster Recombinat (Shingrix) 04/23/2019, 06/22/2019   Zoster, Live 12/28/2007    TDAP status: Due, Education has been provided regarding the importance of this vaccine. Advised may receive this vaccine at local pharmacy or Health Dept. Aware to provide a copy of the vaccination record if obtained from local pharmacy or Health Dept. Verbalized acceptance and understanding.  Flu Vaccine status: Up to date  Pneumococcal vaccine status: Up to date  Covid-19 vaccine status: Information  provided on how to obtain vaccines.   Qualifies for Shingles Vaccine? Yes   Zostavax completed Yes   Shingrix Completed?: Yes  Screening Tests Health Maintenance  Topic Date Due   MAMMOGRAM  10/15/2015   DTaP/Tdap/Td (3 - Td or Tdap) 08/04/2021   COVID-19 Vaccine (5 - 2023-24 season) 07/29/2022   Medicare Annual Wellness (AWV)  09/21/2022   Pneumonia Vaccine 40+ Years old  Completed   INFLUENZA VACCINE  Completed   DEXA SCAN  Completed   Zoster Vaccines- Shingrix  Completed   HPV VACCINES  Aged Out    Health Maintenance  Health Maintenance Due  Topic Date Due   MAMMOGRAM  10/15/2015   DTaP/Tdap/Td (3 - Td or Tdap) 08/04/2021   COVID-19 Vaccine (5 - 2023-24 season) 07/29/2022   Medicare Annual Wellness (AWV)  09/21/2022    Colorectal cancer screening: No longer required.   Mammogram status: No longer required due to age.  Bone Density status: pt declined.  Lung Cancer Screening: (Low Dose CT Chest recommended if Age 80-80 years, 30 pack-year currently smoking OR have quit w/in 15years.) does not qualify.   Additional Screening:  Hepatitis C Screening: does not qualify  Vision Screening: Recommended annual ophthalmology exams for early detection of glaucoma and other disorders of the eye. Is the patient up to date with their annual eye exam?  Yes  Who is the provider or what is the name of the office in which the patient attends annual eye exams? [facility] - Dr. Jerline Pain If pt is not established with a provider, would they like to be referred to a provider to establish care? No .   Dental Screening: Recommended annual dental exams for proper oral hygiene  Community Resource Referral / Chronic Care Management: CRR required this visit?  No   CCM required this visit?  No      Plan:     I have personally reviewed and noted the following in the patient's chart:   Medical and social history Use of alcohol, tobacco or illicit drugs  Current medications and  supplements including opioid prescriptions. Patient is not currently taking opioid prescriptions. Functional ability and status Nutritional status Physical activity Advanced directives List of other physicians Hospitalizations, surgeries, and ER visits in previous 12 months Vitals Screenings to include cognitive, depression, and falls Referrals and appointments  In addition, I have reviewed and discussed with patient certain preventive protocols, quality metrics, and best practice recommendations. A written personalized care plan for preventive services as well as general preventive health recommendations were provided to patient.   Due to this being a telephonic visit, the after visit summary with patients personalized plan was offered to patient via mail or my-chart. Patient would like to access on my-chart.  Beatris Ship, [place]   02/22/2023   Nurse Notes: None

## 2023-02-22 NOTE — Patient Instructions (Signed)
Evelyn [name] , Thank you for taking time to come for your Medicare Wellness Visit. I appreciate your ongoing commitment to your health goals. Please review the following plan we discussed and let me know if I can assist you in the future.      This is a list of the screening recommended for you and due dates:  Health Maintenance  Topic Date Due   Mammogram  10/15/2015   DTaP/Tdap/Td vaccine (3 - Td or Tdap) 08/04/2021   COVID-19 Vaccine (5 - 2023-24 season) 07/29/2022   Medicare Annual Wellness Visit  02/22/2024   Pneumonia Vaccine  Completed   Flu Shot  Completed   DEXA scan (bone density measurement)  Completed   Zoster (Shingles) Vaccine  Completed   HPV Vaccine  Aged Out    Next appointment: Follow up in one year for your annual wellness visit.   Preventive Care 37 Years and Older, Female Preventive care refers to lifestyle choices and visits with your health care provider that can promote health and wellness. What does preventive care include? A yearly physical exam. This is also called an annual well check. Dental exams once or twice a year. Routine eye exams. Ask your health care provider how often you should have your eyes checked. Personal lifestyle choices, including: Daily care of your teeth and gums. Regular physical activity. Eating a healthy diet. Avoiding tobacco and drug use. Limiting alcohol use. Practicing safe sex. Taking low-dose aspirin every day. Taking vitamin and mineral supplements as recommended by your health care provider. What happens during an annual well check? The services and screenings done by your health care provider during your annual well check will depend on your age, overall health, lifestyle risk factors, and family history of disease. Counseling  Your health care provider may ask you questions about your: Alcohol use. Tobacco use. Drug use. Emotional well-being. Home and relationship well-being. Sexual activity. Eating  habits. History of falls. Memory and ability to understand (cognition). Work and work Statistician. Reproductive health. Screening  You may have the following tests or measurements: Height, weight, and BMI. Blood pressure. Lipid and cholesterol levels. These may be checked every 5 years, or more frequently if you are over 29 years old. Skin check. Lung cancer screening. You may have this screening every year starting at age 59 if you have a 30-pack-year history of smoking and currently smoke or have quit within the past 15 years. Fecal occult blood test (FOBT) of the stool. You may have this test every year starting at age 37. Flexible sigmoidoscopy or colonoscopy. You may have a sigmoidoscopy every 5 years or a colonoscopy every 10 years starting at age 16. Hepatitis C blood test. Hepatitis B blood test. Sexually transmitted disease (STD) testing. Diabetes screening. This is done by checking your blood sugar (glucose) after you have not eaten for a while (fasting). You may have this done every 1-3 years. Bone density scan. This is done to screen for osteoporosis. You may have this done starting at age 71. Mammogram. This may be done every 1-2 years. Talk to your health care provider about how often you should have regular mammograms. Talk with your health care provider about your test results, treatment options, and if necessary, the need for more tests. Vaccines  Your health care provider may recommend certain vaccines, such as: Influenza vaccine. This is recommended every year. Tetanus, diphtheria, and acellular pertussis (Tdap, Td) vaccine. You may need a Td booster every 10 years. Zoster vaccine. You may  need this after age 33. Pneumococcal 13-valent conjugate (PCV13) vaccine. One dose is recommended after age 40. Pneumococcal polysaccharide (PPSV23) vaccine. One dose is recommended after age 47. Talk to your health care provider about which screenings and vaccines you need and how  often you need them. This information is not intended to replace advice given to you by your health care provider. Make sure you discuss any questions you have with your health care provider. Document Released: 12/11/2015 Document Revised: 08/03/2016 Document Reviewed: 09/15/2015 Elsevier Interactive Patient Education  2017 [place] Prevention in the Home Falls can cause injuries. They can happen to people of all ages. There are many things you can do to make your home safe and to help prevent falls. What can I do on the outside of my home? Regularly fix the edges of walkways and driveways and fix any cracks. Remove anything that might make you trip as you walk through a door, such as a raised step or threshold. Trim any bushes or trees on the path to your home. Use bright outdoor lighting. Clear any walking paths of anything that might make someone trip, such as rocks or tools. Regularly check to see if handrails are loose or broken. Make sure that both sides of any steps have handrails. Any raised decks and porches should have guardrails on the edges. Have any leaves, snow, or ice cleared regularly. Use sand or salt on walking paths during winter. Clean up any spills in your garage right away. This includes oil or grease spills. What can I do in the bathroom? Use night lights. Install grab bars by the toilet and in the tub and shower. Do not use towel bars as grab bars. Use non-skid mats or decals in the tub or shower. If you need to sit down in the shower, use a plastic, non-slip stool. Keep the floor dry. Clean up any water that spills on the floor as soon as it happens. Remove soap buildup in the tub or shower regularly. Attach bath mats securely with double-sided non-slip rug tape. Do not have throw rugs and other things on the floor that can make you trip. What can I do in the bedroom? Use night lights. Make sure that you have a light by your bed that is easy to  reach. Do not use any sheets or blankets that are too big for your bed. They should not hang down onto the floor. Have a firm chair that has side arms. You can use this for support while you get dressed. Do not have throw rugs and other things on the floor that can make you trip. What can I do in the kitchen? Clean up any spills right away. Avoid walking on wet floors. Keep items that you use a lot in easy-to-reach places. If you need to reach something above you, use a strong step stool that has a grab bar. Keep electrical cords out of the way. Do not use floor polish or wax that makes floors slippery. If you must use wax, use non-skid floor wax. Do not have throw rugs and other things on the floor that can make you trip. What can I do with my stairs? Do not leave any items on the stairs. Make sure that there are handrails on both sides of the stairs and use them. Fix handrails that are broken or loose. Make sure that handrails are as long as the stairways. Check any carpeting to make sure that it is firmly attached  to the stairs. Fix any carpet that is loose or worn. Avoid having throw rugs at the top or bottom of the stairs. If you do have throw rugs, attach them to the floor with carpet tape. Make sure that you have a light switch at the top of the stairs and the bottom of the stairs. If you do not have them, ask someone to add them for you. What else can I do to help prevent falls? Wear shoes that: Do not have high heels. Have rubber bottoms. Are comfortable and fit you well. Are closed at the toe. Do not wear sandals. If you use a stepladder: Make sure that it is fully opened. Do not climb a closed stepladder. Make sure that both sides of the stepladder are locked into place. Ask someone to hold it for you, if possible. Clearly mark and make sure that you can see: Any grab bars or handrails. First and last steps. Where the edge of each step is. Use tools that help you move  around (mobility aids) if they are needed. These include: Canes. Walkers. Scooters. Crutches. Turn on the lights when you go into a dark area. Replace any light bulbs as soon as they burn out. Set up your furniture so you have a clear path. Avoid moving your furniture around. If any of your floors are uneven, fix them. If there are any pets around you, be aware of where they are. Review your medicines with your doctor. Some medicines can make you feel dizzy. This can increase your chance of falling. Ask your doctor what other things that you can do to help prevent falls. This information is not intended to replace advice given to you by your health care provider. Make sure you discuss any questions you have with your health care provider. Document Released: 09/10/2009 Document Revised: 04/21/2016 Document Reviewed: 12/19/2014 Elsevier Interactive Patient Education  2017 [employer].

## 2023-02-28 ENCOUNTER — Ambulatory Visit (INDEPENDENT_AMBULATORY_CARE_PROVIDER_SITE_OTHER): Payer: Medicare Other | Admitting: Family Medicine

## 2023-02-28 ENCOUNTER — Encounter: Payer: Self-pay | Admitting: Family Medicine

## 2023-02-28 VITALS — BP 118/60 | HR 79 | Temp 98.6°F | Resp 18 | Ht 62.0 in | Wt 120.8 lb

## 2023-02-28 DIAGNOSIS — E785 Hyperlipidemia, unspecified: Secondary | ICD-10-CM

## 2023-02-28 DIAGNOSIS — I739 Peripheral vascular disease, unspecified: Secondary | ICD-10-CM | POA: Diagnosis not present

## 2023-02-28 DIAGNOSIS — I4821 Permanent atrial fibrillation: Secondary | ICD-10-CM | POA: Diagnosis not present

## 2023-02-28 DIAGNOSIS — I10 Essential (primary) hypertension: Secondary | ICD-10-CM | POA: Diagnosis not present

## 2023-02-28 DIAGNOSIS — Z0000 Encounter for general adult medical examination without abnormal findings: Secondary | ICD-10-CM | POA: Diagnosis not present

## 2023-02-28 NOTE — Patient Instructions (Signed)
Preventive Care 65 Years and Older, Female Preventive care refers to lifestyle choices and visits with your health care provider that can promote health and wellness. Preventive care visits are also called wellness exams. What can I expect for my preventive care visit? Counseling Your health care provider may ask you questions about your: Medical history, including: Past medical problems. Family medical history. Pregnancy and menstrual history. History of falls. Current health, including: Memory and ability to understand (cognition). Emotional well-being. Home life and relationship well-being. Sexual activity and sexual health. Lifestyle, including: Alcohol, nicotine or tobacco, and drug use. Access to firearms. Diet, exercise, and sleep habits. Work and work environment. Sunscreen use. Safety issues such as seatbelt and bike helmet use. Physical exam Your health care provider will check your: Height and weight. These may be used to calculate your BMI (body mass index). BMI is a measurement that tells if you are at a healthy weight. Waist circumference. This measures the distance around your waistline. This measurement also tells if you are at a healthy weight and may help predict your risk of certain diseases, such as type 2 diabetes and high blood pressure. Heart rate and blood pressure. Body temperature. Skin for abnormal spots. What immunizations do I need?  Vaccines are usually given at various ages, according to a schedule. Your health care provider will recommend vaccines for you based on your age, medical history, and lifestyle or other factors, such as travel or where you work. What tests do I need? Screening Your health care provider may recommend screening tests for certain conditions. This may include: Lipid and cholesterol levels. Hepatitis C test. Hepatitis B test. HIV (human immunodeficiency virus) test. STI (sexually transmitted infection) testing, if you are at  risk. Lung cancer screening. Colorectal cancer screening. Diabetes screening. This is done by checking your blood sugar (glucose) after you have not eaten for a while (fasting). Mammogram. Talk with your health care provider about how often you should have regular mammograms. BRCA-related cancer screening. This may be done if you have a family history of breast, ovarian, tubal, or peritoneal cancers. Bone density scan. This is done to screen for osteoporosis. Talk with your health care provider about your test results, treatment options, and if necessary, the need for more tests. Follow these instructions at home: Eating and drinking  Eat a diet that includes fresh fruits and vegetables, whole grains, lean protein, and low-fat dairy products. Limit your intake of foods with high amounts of sugar, saturated fats, and salt. Take vitamin and mineral supplements as recommended by your health care provider. Do not drink alcohol if your health care provider tells you not to drink. If you drink alcohol: Limit how much you have to 0-1 drink a day. Know how much alcohol is in your drink. In the [place] one drink equals one 12 oz bottle of beer (355 mL), one 5 oz glass of wine (148 mL), or one 1 oz glass of hard liquor (44 mL). Lifestyle Brush your teeth every morning and night with fluoride toothpaste. Floss one time each day. Exercise for at least 30 minutes 5 or more days each week. Do not use any products that contain nicotine or tobacco. These products include cigarettes, chewing tobacco, and vaping devices, such as e-cigarettes. If you need help quitting, ask your health care provider. Do not use drugs. If you are sexually active, practice safe sex. Use a condom or other form of protection in order to prevent STIs. Take aspirin only as told by   your health care provider. Make sure that you understand how much to take and what form to take. Work with your health care provider to find out whether it  is safe and beneficial for you to take aspirin daily. Ask your health care provider if you need to take a cholesterol-lowering medicine (statin). Find healthy ways to manage stress, such as: Meditation, yoga, or listening to music. Journaling. Talking to a trusted person. Spending time with friends and family. Minimize exposure to UV radiation to reduce your risk of skin cancer. Safety Always wear your seat belt while driving or riding in a vehicle. Do not drive: If you have been drinking alcohol. Do not ride with someone who has been drinking. When you are tired or distracted. While texting. If you have been using any mind-altering substances or drugs. Wear a helmet and other protective equipment during sports activities. If you have firearms in your house, make sure you follow all gun safety procedures. What's next? Visit your health care provider once a year for an annual wellness visit. Ask your health care provider how often you should have your eyes and teeth checked. Stay up to date on all vaccines. This information is not intended to replace advice given to you by your health care provider. Make sure you discuss any questions you have with your health care provider. Document Revised: 05/12/2021 Document Reviewed: 05/12/2021 Elsevier Patient Education  2023 Elsevier Inc.   

## 2023-02-28 NOTE — Progress Notes (Signed)
Subjective:   By signing my name below, I, Shehryar Baig, attest that this documentation has been prepared under the direction and in the presence of Ann Held, DO. 02/28/2023   Patient ID: Evelyn [name], female    DOB: 14-Nov-1930, 89 y.o.   MRN: [id number]  No chief complaint on file.   HPI Patient is in today for office visit.  She stopped getting mammograms and states that she has been getting a lot of moles on her breast.     She complains of rhinorrhea. She goes to the eye doctor and dentist every 6 months. She received her tetanus vaccine a month ago.She decided not to get a COVID vaccine this year.    Past Medical History:  Diagnosis Date   Atrial fibrillation ([place])    Hyperlipidemia    Hypertension    Osteoporosis    Post-menopausal    HRT    Past Surgical History:  Procedure Laterality Date   ABDOMINAL HYSTERECTOMY     BREAST LUMPECTOMY  11/28/1973   EYE SURGERY     Cataract removal    Family History  Problem Relation Age of Onset   Arthritis Mother    Heart disease Mother    Heart attack Father 66   Prostate cancer Father    COPD Father    Glaucoma Father    Hypertension Sister    COPD Sister    Arthritis Sister     Social History   Socioeconomic History   Marital status: Widowed    Spouse name: Not on file   Number of children: Not on file   Years of education: Not on file   Highest education level: Not on file  Occupational History   Not on file  Tobacco Use   Smoking status: Former    Packs/day: 0.75    Years: 18.00    Additional pack years: 0.00    Total pack years: 13.50    Types: Cigarettes    Quit date: 02/20/1973    Years since quitting: 50.0   Smokeless tobacco: Never  Substance and Sexual Activity   Alcohol use: Yes   Drug use: No   Sexual activity: Yes    Partners: Male  Other Topics Concern   Not on file  Social History Narrative   Exercise--yard work, house work   Investment banker, operational of Systems developer Strain: [place]  (07/27/2022)   Overall Financial Resource Strain (CARDIA)    Difficulty of Paying Living Expenses: Not very hard  Food Insecurity: No Food Insecurity (02/22/2023)   Hunger Vital Sign    Worried About Running Out of Food in the Last Year: Never true    Fruita in the Last Year: Never true  Transportation Needs: No Transportation Needs (02/22/2023)   PRAPARE - Hydrologist (Medical): No    Lack of Transportation (Non-Medical): No  Physical Activity: Insufficiently Active (04/27/2022)   Exercise Vital Sign    Days of Exercise per Week: 3 days    Minutes of Exercise per Session: 10 min  Stress: No Stress Concern Present (09/21/2021)   [place]    Feeling of Stress : Not at all  Social Connections: Moderately Integrated (02/22/2023)   Social Connection and Isolation Panel [NHANES]    Frequency of Communication with Friends and Family: More than three times a week    Frequency of Social Gatherings  with Friends and Family: Once a week    Attends Religious Services: More than 4 times per year    Active Member of Clubs or Organizations: Yes    Attends Archivist Meetings: More than 4 times per year    Marital Status: Widowed  Intimate Partner Violence: Not At Risk (02/22/2023)   Humiliation, Afraid, Rape, and Kick questionnaire    Fear of Current or Ex-Partner: No    Emotionally Abused: No    Physically Abused: No    Sexually Abused: No    Outpatient Medications Prior to Visit  Medication Sig Dispense Refill   amLODipine (NORVASC) 5 MG tablet TAKE 1 TABLET(5 MG) BY MOUTH DAILY 90 tablet 3   calcium carbonate (OS-CAL) 600 MG TABS Take 600 mg by mouth daily.     Cholecalciferol (VITAMIN D PO) Take 1 capsule by mouth daily. 2000 units     ELIQUIS 2.5 MG TABS tablet TAKE 1 TABLET BY MOUTH TWICE DAILY 180 tablet 1   fluticasone (FLONASE) 50 MCG/ACT nasal  spray SHAKE LIQUID AND USE 2 SPRAYS IN EACH NOSTRIL DAILY (Patient not taking: Reported on 11/11/2022) 48 g 0   glucosamine-chondroitin 500-400 MG tablet Take 1 tablet by mouth daily.      Latanoprostene Bunod 0.024 % SOLN Apply 1 drop to eye at bedtime.     metoprolol succinate (TOPROL-XL) 100 MG 24 hr tablet TAKE 1 TABLET(100 MG) BY MOUTH DAILY WITH OR IMMEDIATELY FOLLOWING A MEAL 90 tablet 2   psyllium (METAMUCIL) 58.6 % powder Take 1 packet by mouth daily.     Red Yeast Rice 600 MG TABS Take 1 tablet by mouth daily.     timolol (TIMOPTIC) 0.5 % ophthalmic solution 1 drop every morning.     No facility-administered medications prior to visit.    Allergies  Allergen Reactions   Vicodin [Hydrocodone-Acetaminophen] Other (See Comments)    Abdominal Pain   Pletaal [Cilostazol] Diarrhea   Statins     Review of Systems  HENT:  Hearing loss: (+)rhinorrhea.   Skin:        (+)moles on breasts       Objective:    Physical Exam Constitutional:      General: She is not in acute distress.    Appearance: Normal appearance. She is not ill-appearing.  HENT:     Head: Normocephalic and atraumatic.     Right Ear: Tympanic membrane, ear canal and external ear normal.     Left Ear: Tympanic membrane, ear canal and external ear normal.  Eyes:     Extraocular Movements: Extraocular movements intact.     Pupils: Pupils are equal, round, and reactive to light.  Cardiovascular:     Rate and Rhythm: Normal rate and regular rhythm.     Heart sounds: Normal heart sounds. No murmur heard.    No gallop.  Pulmonary:     Effort: Pulmonary effort is normal. No respiratory distress.     Breath sounds: Normal breath sounds. No wheezing or rales.  Skin:    General: Skin is warm and dry.  Neurological:     Mental Status: She is alert and oriented to person, place, and time.  Psychiatric:        Judgment: Judgment normal.     There were no vitals taken for this visit. Wt Readings from Last 3  Encounters:  09/27/22 122 lb 3.2 oz (55.4 kg)  09/20/22 121 lb (54.9 kg)  07/15/22 121 lb (54.9 kg)  Assessment & Plan:  There are no diagnoses linked to this encounter.  I, Shehryar [place], personally preformed the services described in this documentation.  All medical record entries made by the scribe were at my direction and in my presence.  I have reviewed the chart and discharge instructions (if applicable) and agree that the record reflects my personal performance and is accurate and complete. 02/28/2023   I,Shehryar Baig,acting as a scribe for Ann Held, DO.,have documented all relevant documentation on the behalf of Ann Held, DO,as directed by  Ann Held, DO while in the presence of Ann Held, DO.   Shehryar [employer]

## 2023-03-01 LAB — COMPREHENSIVE METABOLIC PANEL
ALT: 19 U/L (ref 0–35)
AST: 21 U/L (ref 0–37)
Albumin: 4.2 g/dL (ref 3.5–5.2)
Alkaline Phosphatase: 67 U/L (ref 39–117)
BUN: 12 mg/dL (ref 6–23)
CO2: 31 mEq/L (ref 19–32)
Calcium: 9.5 mg/dL (ref 8.4–10.5)
Chloride: 101 mEq/L (ref 96–112)
Creatinine, Ser: 0.77 mg/dL (ref 0.40–1.20)
GFR: 67.56 mL/min (ref >60.00)
Glucose, Bld: 109 mg/dL — ABNORMAL HIGH (ref 70–99)
Potassium: 4.1 mEq/L (ref 3.5–5.1)
Sodium: 139 mEq/L (ref 135–145)
Total Bilirubin: 0.6 mg/dL (ref 0.2–1.2)
Total Protein: 7.6 g/dL (ref 6.0–8.3)

## 2023-03-01 LAB — LIPID PANEL
Cholesterol: 184 mg/dL (ref 0–200)
HDL: 63.4 mg/dL (ref >39.00)
LDL Cholesterol: 85 mg/dL (ref 0–99)
NonHDL: 120.23
Total CHOL/HDL Ratio: 3
Triglycerides: 175 mg/dL — ABNORMAL HIGH (ref 0.0–149.0)
VLDL: 35 mg/dL (ref 0.0–40.0)

## 2023-03-01 LAB — CBC WITH DIFFERENTIAL/PLATELET
Basophils Absolute: 0.1 10*3/uL (ref 0.0–0.1)
Basophils Relative: 0.7 % (ref 0.0–3.0)
Eosinophils Absolute: 0.1 10*3/uL (ref 0.0–0.7)
Eosinophils Relative: 1.9 % (ref 0.0–5.0)
HCT: 39.8 % (ref 36.0–46.0)
Hemoglobin: 13.3 g/dL (ref 12.0–15.0)
Lymphocytes Relative: 33.7 % (ref 12.0–46.0)
Lymphs Abs: 2.4 10*3/uL (ref 0.7–4.0)
MCHC: 33.4 g/dL (ref 30.0–36.0)
MCV: 95.2 fl (ref 78.0–100.0)
Monocytes Absolute: 1.1 10*3/uL — ABNORMAL HIGH (ref 0.1–1.0)
Monocytes Relative: 14.7 % — ABNORMAL HIGH (ref 3.0–12.0)
Neutro Abs: 3.5 10*3/uL (ref 1.4–7.7)
Neutrophils Relative %: 49 % (ref 43.0–77.0)
Platelets: 172 10*3/uL (ref 150.0–400.0)
RBC: 4.18 Mil/uL (ref 3.87–5.11)
RDW: 14.1 % (ref 11.5–15.5)
WBC: 7.2 10*3/uL (ref 4.0–10.5)

## 2023-03-01 LAB — TSH: TSH: 2.68 u[IU]/mL (ref 0.35–5.50)

## 2023-03-01 LAB — VITAMIN D 25 HYDROXY (VIT D DEFICIENCY, FRACTURES): VITD: 68.87 ng/mL (ref 30.00–100.00)

## 2023-03-02 ENCOUNTER — Encounter: Payer: Self-pay | Admitting: Family Medicine

## 2023-03-02 NOTE — Assessment & Plan Note (Signed)
Per cardiology 

## 2023-03-02 NOTE — Assessment & Plan Note (Signed)
Encourage heart healthy diet such as MIND or DASH diet, increase exercise, avoid trans fats, simple carbohydrates and processed foods, consider a krill or fish or flaxseed oil cap daily.  °

## 2023-03-02 NOTE — Assessment & Plan Note (Signed)
Well controlled, no changes to meds. Encouraged heart healthy diet such as the DASH diet and exercise as tolerated.  °

## 2023-03-03 NOTE — Progress Notes (Signed)
Letter mailed out. Done 

## 2023-03-17 ENCOUNTER — Ambulatory Visit (INDEPENDENT_AMBULATORY_CARE_PROVIDER_SITE_OTHER): Payer: Medicare Other | Admitting: Pharmacist

## 2023-03-17 DIAGNOSIS — E785 Hyperlipidemia, unspecified: Secondary | ICD-10-CM

## 2023-03-17 DIAGNOSIS — I4821 Permanent atrial fibrillation: Secondary | ICD-10-CM

## 2023-03-17 DIAGNOSIS — I10 Essential (primary) hypertension: Secondary | ICD-10-CM

## 2023-03-17 NOTE — Patient Instructions (Signed)
Fall Prevention in the Home, Adult Falls can cause injuries and affect people of all ages. There are many simple things that you can do to make your home safe and to help prevent falls. If you need it, ask for help making these changes. What actions can I take to prevent falls? General information Use good lighting in all rooms. Make sure to: Replace any light bulbs that burn out. Turn on lights if it is dark and use night-lights. Keep items that you use often in easy-to-reach places. Lower the shelves around your home if needed. Move furniture so that there are clear paths around it. Do not keep throw rugs or other things on the floor that can make you trip. If any of your floors are uneven, fix them. Add color or contrast paint or tape to clearly mark and help you see: Grab bars or handrails. First and last steps of staircases. Where the edge of each step is. If you use a ladder or stepladder: Make sure that it is fully opened. Do not climb a closed ladder. Make sure the sides of the ladder are locked in place. Have someone hold the ladder while you use it. Know where your pets are as you move through your home. What can I do in the bathroom?     Keep the floor dry. Clean up any water that is on the floor right away. Remove soap buildup in the bathtub or shower. Buildup makes bathtubs and showers slippery. Use non-skid mats or decals on the floor of the bathtub or shower. Attach bath mats securely with double-sided, non-slip rug tape. If you need to sit down while you are in the shower, use a non-slip stool. Install grab bars by the toilet and in the bathtub and shower. Do not use towel bars as grab bars. What can I do in the bedroom? Make sure that you have a light by your bed that is easy to reach. Do not use any sheets or blankets on your bed that hang to the floor. Have a firm bench or chair with side arms that you can use for support when you get dressed. What can I do in  the kitchen? Clean up any spills right away. If you need to reach something above you, use a sturdy step stool that has a grab bar. Keep electrical cables out of the way. Do not use floor polish or wax that makes floors slippery. What can I do with my stairs? Do not leave anything on the stairs. Make sure that you have a light switch at the top and the bottom of the stairs. Have them installed if you do not have them. Make sure that there are handrails on both sides of the stairs. Fix handrails that are broken or loose. Make sure that handrails are as long as the staircases. Install non-slip stair treads on all stairs in your home if they do not have carpet. Avoid having throw rugs at the top or bottom of stairs, or secure the rugs with carpet tape to prevent them from moving. Choose a carpet design that does not hide the edge of steps on the stairs. Make sure that carpet is firmly attached to the stairs. Fix any carpet that is loose or worn. What can I do on the outside of my home? Use bright outdoor lighting. Repair the edges of walkways and driveways and fix any cracks. Clear paths of anything that can make you trip, such as tools or rocks. Add   color or contrast paint or tape to clearly mark and help you see high doorway thresholds. Trim any bushes or trees on the main path into your home. Check that handrails are securely fastened and in good repair. Both sides of all steps should have handrails. Install guardrails along the edges of any raised decks or porches. Have leaves, snow, and ice cleared regularly. Use sand, salt, or ice melt on walkways during winter months if you live where there is ice and snow. In the garage, clean up any spills right away, including grease or oil spills. What other actions can I take? Review your medicines with your health care provider. Some medicines can make you confused or feel dizzy. This can increase your chance of falling. Wear closed-toe shoes that  fit well and support your feet. Wear shoes that have rubber soles and low heels. Use a cane, walker, scooter, or crutches that help you move around if needed. Talk with your provider about other ways that you can decrease your risk of falls. This may include seeing a physical therapist to learn to do exercises to improve movement and strength. Where to find more information Centers for Disease Control and Prevention, [employer]: cdc.gov National Institute on Aging: nia.nih.gov National Institute on Aging: nia.nih.gov Contact a health care provider if: You are afraid of falling at home. You feel weak, drowsy, or dizzy at home. You fall at home. Get help right away if you: Lose consciousness or have trouble moving after a fall. Have a fall that causes a head injury. These symptoms may be an emergency. Get help right away. Call 911. Do not wait to see if the symptoms will go away. Do not drive yourself to the hospital. This information is not intended to replace advice given to you by your health care provider. Make sure you discuss any questions you have with your health care provider. Document Revised: 07/18/2022 Document Reviewed: 07/18/2022 Elsevier Patient Education  2023 Elsevier Inc.  

## 2023-03-17 NOTE — Progress Notes (Signed)
Pharmacy Note  03/17/2023 Name: Evelyn [name] MRN: [id number] DOB: November 09, 1930  Subjective: Evelyn [name] Evelyn [name] is a 89 y.o. year old female who is a primary care patient of [name], Grayling Congress, DO. Clinical Pharmacist Practitioner referral was placed to assist with medication management.    Engaged with patient by telephone for follow up visit today.  Hypertension; Office blood pressure has been at goal. Patient is not checking blood pressure at home.   BP Readings from Last 3 Encounters:  02/28/23 118/60  09/27/22 134/72  09/20/22 110/82    Hyperlipidemia: Statin intolerant. Taking Red Yeast Rice daily.   Afib: well controlled. Taking Eliquis for stroke prevention and metoprolol for HR control.  Had some bleeding this morning - she called Dr Jari Sportsman office.   Patient's granddaughter is a Systems analyst. She has her on a routine of lifting small weights and riding stationary bike. Per patient she is not doing this consistently. She does continue to do yard work.  SDOH (Social Determinants of Health) assessments and interventions performed:  SDOH Interventions    Flowsheet Row Office Visit from 03/17/2023 in [facility] at [facility] Clinical Support from 02/22/2023 in [facility] Primary Care at [facility] Chronic Care Management from 07/27/2022 in [facility] Primary Care at [facility] Chronic Care Management from 04/27/2022 in [facility] Primary Care at [facility] Chronic Care Management from 12/28/2021 in [facility] Primary Care at [facility] Clinical Support from 09/21/2021 in [facility] Primary Care at [facility]  SDOH Interventions        Food Insecurity Interventions -- Intervention Not Indicated -- -- -- --  Housing Interventions -- Intervention Not Indicated -- -- -- --  Transportation Interventions -- Intervention Not  Indicated -- -- -- --  Utilities Interventions -- Intervention Not Indicated -- -- -- --  Alcohol Usage Interventions -- Intervention Not Indicated (Score <7) -- -- -- --  Financial Strain Interventions Intervention Not Indicated -- Patient Evelyn [name] is in Medicare Coverage gap but states cost of Eliquis is affordable,  declined PAP application] -- Other (Comment)  [patient did not complete PAP forms for 2022 but will follow up for 2023 and once reaches OOP spend will offer to assists with applying for PAP if patient is interested.] --  Physical Activity Interventions Other (Comments)  [patient to try to be more consistent with exercise.] -- -- Intervention Not Indicated  [discussed increasing frequency and length of exericse.] -- Intervention Not Indicated  [pt has set a goal to increase activity]  Social Connections Interventions -- Intervention Not Indicated -- -- -- --        Objective: Review of patient status, including review of consultants reports, laboratory and other test data, was performed as part of comprehensive.  Lab Results  Component Value Date   CREATININE 0.77 02/28/2023   CREATININE 0.73 02/01/2022   CREATININE 0.72 04/15/2021    No results found for: "HGBA1C"     Component Value Date/Time   CHOL 184 02/28/2023 1513   TRIG 175.0 (H) 02/28/2023 1513   HDL 63.40 02/28/2023 1513   CHOLHDL 3 02/28/2023 1513   VLDL 35.0 02/28/2023 1513   LDLCALC 85 02/28/2023 1513   LDLDIRECT 130.5 03/26/2013 0916     Clinical ASCVD: No  The ASCVD Risk score (Arnett DK, et al., 2019) failed to calculate for the following reasons:   The  2019 ASCVD risk score is only valid for ages 36 to 58    BP Readings from Last 3 Encounters:  02/28/23 118/60  09/27/22 134/72  09/20/22 110/82     Allergies  Allergen Reactions   Vicodin [Hydrocodone-Acetaminophen] Other (See Comments)    Abdominal Pain   Pletaal [Cilostazol] Diarrhea   Statins     Medications Reviewed Today      Reviewed by Donato Schultz, DO (Physician) on 03/02/23 at 2146  Med List Status: <None>   Medication Order Taking? Sig Documenting Provider Last Dose Status Informant  amLODipine (NORVASC) 5 MG tablet [id number] Yes TAKE 1 TABLET(5 MG) BY MOUTH DAILY Donato Schultz, DO Taking Active   calcium carbonate (OS-CAL) 600 MG TABS [id number] Yes Take 600 mg by mouth daily. [provider] Taking Active   Cholecalciferol (VITAMIN D PO) [id number] Yes Take 1 capsule by mouth daily. 2000 units [provider] Taking Active   ELIQUIS 2.5 MG TABS tablet [id number] Yes TAKE 1 TABLET BY MOUTH TWICE DAILY Iran Ouch, MD Taking Active   Patient not taking:  Discontinued 02/28/23 1455 (Patient Preference)   glucosamine-chondroitin 500-400 MG tablet [id number] Yes Take 1 tablet by mouth daily.  [provider] Taking Active   Latanoprostene Bunod 0.024 % SOLN [id number] Yes Apply 1 drop to eye at bedtime. [provider] Taking Active            Med Note Evelyn [name], Evelyn [name]   Fri Nov 11, 2022  2:35 PM) Vyzulta eye drops  metoprolol succinate (TOPROL-XL) 100 MG 24 hr tablet [id number] Yes TAKE 1 TABLET(100 MG) BY MOUTH DAILY WITH OR IMMEDIATELY FOLLOWING A MEAL Iran Ouch, MD Taking Active   Patient not taking:  Discontinued 02/28/23 1456 (Patient Preference)   Red Yeast Rice 600 MG TABS [id number] Yes Take 1 tablet by mouth daily. [provider] Taking Active   timolol (TIMOPTIC) 0.5 % ophthalmic solution [id number] Yes 1 drop every morning. [provider] Taking Active             Patient Active Problem List   Diagnosis Date Noted   Change in bowel function 02/01/2022   Hyperlipidemia 04/14/2020   Seasonal allergies 11/27/2019   Atrial fibrillation 03/26/2019   Anticoagulated 03/26/2019   Loose stools 04/14/2015   Osteoarthritis 04/14/2015   Chronic flank pain 06/17/2013   Plantar fasciitis 05/25/2013   PAD (peripheral artery  disease) (HCC) 03/07/2013   Pain in joint, lower leg 02/05/2013   CARDIAC MURMUR 06/29/2010   VARICOSE VEINS, LOWER EXTREMITIES 06/02/2010   PLANTAR WART, LEFT 04/03/2009   MOLE 04/03/2009   HIP PAIN, RIGHT 04/03/2009   Dyslipidemia, goal LDL below 70 06/29/2007   ARTIFICIAL MENOPAUSE 06/29/2007   Pain in limb 06/29/2007   BREAST BIOPSY, HX OF 06/29/2007   GLAUCOMA, LEFT EYE 04/27/2007   Essential hypertension 04/27/2007   Osteoporosis 04/27/2007     Medication Assistance:  None required.  Patient affirms current coverage meets needs.   Assessment / Plan: Hypertension: Controlled Continue amlodipine  daily and metoprolol sucinate  daily.   Hyperlipidemia: Statin intolerant. Last LDL was improved Continue Red Yeast Rice daily.   Afib: well controlled.  Continue Eliquis 2.5 mg twice a day for stroke prevention and metoprolol for HR control.  (Dose of Eliquis has been adjusted for age > 80yo and weight < 60 kg) Reviewed signs and symptoms of bleeding - patient to continue to monitor and notify office if any concerns.  Medication management:  Reviewed and updated medication list Reviewed refill history and adherence  Reminded patient to stay active - goal is 150 minutes.  Reminded to hydrate during day especially when she is doing yard work.  Discussed getting medic alert system - patient declined.   Follow Up:  Patient requested follow up in 4 to 6 months   Evelyn [name], PharmD Clinical Pharmacist [facility] Primary Care  - [facility] [phone]

## 2023-07-10 ENCOUNTER — Telehealth: Payer: Self-pay | Admitting: Cardiovascular Disease

## 2023-07-10 NOTE — Telephone Encounter (Signed)
*  STAT* If patient is at the pharmacy, call can be transferred to refill team.   1. Which medications need to be refilled? (please list name of each medication and dose if known)   ELIQUIS 2.5 MG TABS tablet    2. Which pharmacy/location (including street and city if local pharmacy) is medication to be sent to?  [id number] - [place], [place] - [address] AT [facility] [address]    3. Do they need a 30 day or 90 day supply? 30

## 2023-07-13 ENCOUNTER — Other Ambulatory Visit: Payer: Self-pay | Admitting: Cardiovascular Disease

## 2023-07-13 DIAGNOSIS — I4821 Permanent atrial fibrillation: Secondary | ICD-10-CM

## 2023-07-13 NOTE — Telephone Encounter (Signed)
Please review

## 2023-07-14 NOTE — Telephone Encounter (Signed)
Prescription refill request for Eliquis received. Indication:AFIB Last office visit:10/23 Scr:0.77  4/24 Age: 89 Weight:54.8  kg  Prescription refilled

## 2023-07-14 NOTE — Telephone Encounter (Signed)
Refill Request.  

## 2023-07-25 DIAGNOSIS — H524 Presbyopia: Secondary | ICD-10-CM | POA: Diagnosis not present

## 2023-07-25 DIAGNOSIS — H401122 Primary open-angle glaucoma, left eye, moderate stage: Secondary | ICD-10-CM | POA: Diagnosis not present

## 2023-07-25 DIAGNOSIS — H401111 Primary open-angle glaucoma, right eye, mild stage: Secondary | ICD-10-CM | POA: Diagnosis not present

## 2023-07-25 DIAGNOSIS — Z961 Presence of intraocular lens: Secondary | ICD-10-CM | POA: Diagnosis not present

## 2023-08-02 ENCOUNTER — Ambulatory Visit (INDEPENDENT_AMBULATORY_CARE_PROVIDER_SITE_OTHER): Payer: Medicare Other | Admitting: Pharmacist

## 2023-08-02 DIAGNOSIS — I4821 Permanent atrial fibrillation: Secondary | ICD-10-CM

## 2023-08-02 DIAGNOSIS — E785 Hyperlipidemia, unspecified: Secondary | ICD-10-CM

## 2023-08-02 DIAGNOSIS — I10 Essential (primary) hypertension: Secondary | ICD-10-CM

## 2023-08-02 NOTE — Progress Notes (Signed)
Pharmacy Note  08/02/2023 Name: Evelyn [name] MRN: [id number] DOB: [birth year]  Subjective: Evelyn [name] [name] is a 89 y.o. year old female who is a primary care patient of [name], Grayling Congress, DO. Clinical Pharmacist Practitioner referral was placed to assist with medication management.    Engaged with patient by telephone for follow up visit today.  Hypertension Office blood pressure has been at goal. Patient is not checking blood pressure at home.   BP Readings from Last 3 Encounters:  02/28/23 118/60  09/27/22 134/72  09/20/22 110/82    Hyperlipidemia: Statin intolerant. Taking Red Yeast Rice daily.   Afib: well controlled. Taking Eliquis for stroke prevention and metoprolol for HR control.  She does report easy bruising. She noted a half dollar sized bruise on her leg from bumping into furniture.   Patient's granddaughter is a Systems analyst and has helped her create a plan of lifting small weights and riding stationary bike. Per patient she is not doing this consistently.  She does continue to do yard work and housework.   SDOH (Social Determinants of Health) assessments and interventions performed:  SDOH Interventions    Flowsheet Row Office Visit from 03/17/2023 in [facility] Primary Care at [facility] Clinical Support from 02/22/2023 in [facility] Primary Care at [facility] Chronic Care Management from 07/27/2022 in [facility] Primary Care at [facility] Chronic Care Management from 04/27/2022 in [facility] Primary Care at [facility] Chronic Care Management from 12/28/2021 in [facility] Primary Care at [facility] Clinical Support from 09/21/2021 in [facility] Primary Care at [facility]  SDOH Interventions        Food Insecurity Interventions -- Intervention Not Indicated -- -- -- --  Housing Interventions -- Intervention Not  Indicated -- -- -- --  Transportation Interventions -- Intervention Not Indicated -- -- -- --  Utilities Interventions -- Intervention Not Indicated -- -- -- --  Alcohol Usage Interventions -- Intervention Not Indicated (Score <7) -- -- -- --  Financial Strain Interventions Intervention Not Indicated -- Patient [name] is in Medicare Coverage gap but states cost of Eliquis is affordable,  declined PAP application] -- Other (Comment)  [patient did not complete PAP forms for 2022 but will follow up for 2023 and once reaches OOP spend will offer to assists with applying for PAP if patient is interested.] --  Physical Activity Interventions Other (Comments)  [patient to try to be more consistent with exercise.] -- -- Intervention Not Indicated  [discussed increasing frequency and length of exericse.] -- Intervention Not Indicated  [pt has set a goal to increase activity]  Social Connections Interventions -- Intervention Not Indicated -- -- -- --        Objective: Review of patient status, including review of consultants reports, laboratory and other test data, was performed as part of comprehensive.  Lab Results  Component Value Date   CREATININE 0.77 02/28/2023   CREATININE 0.73 02/01/2022   CREATININE 0.72 04/15/2021    No results found for: "HGBA1C"     Component Value Date/Time   CHOL 184 02/28/2023 1513   TRIG 175.0 (H) 02/28/2023 1513   HDL 63.40 02/28/2023 1513   CHOLHDL 3 02/28/2023 1513   VLDL 35.0 02/28/2023 1513   LDLCALC 85 02/28/2023 1513   LDLDIRECT 130.5 03/26/2013 0916     Clinical ASCVD: No  The ASCVD Risk score (Arnett DK,  et al., 2019) failed to calculate for the following reasons:   The 2019 ASCVD risk score is only valid for ages 87 to 19    BP Readings from Last 3 Encounters:  02/28/23 118/60  09/27/22 134/72  09/20/22 110/82     Allergies  Allergen Reactions   Vicodin [Hydrocodone-Acetaminophen] Other (See Comments)    Abdominal Pain    Pletaal [Cilostazol] Diarrhea   Statins     Medications Reviewed Today     Reviewed by Henrene Pastor, RPH-CPP (Pharmacist) on 08/02/23 at 1434  Med List Status: <None>   Medication Order Taking? Sig Documenting Provider Last Dose Status Informant  amLODipine (NORVASC) 5 MG tablet [id number] Yes TAKE 1 TABLET(5 MG) BY MOUTH DAILY Seabron Spates R, DO Taking Active   ascorbic acid (VITAMIN C) 500 MG tablet [id number] Yes Take 500 mg by mouth daily. [provider] Taking Active   calcium carbonate (OS-CAL) 600 MG TABS [id number] Yes Take 600 mg by mouth daily. [provider] Taking Active   Cholecalciferol (VITAMIN D PO) [id number] Yes Take 1 capsule by mouth daily. 2000 units [provider] Taking Active   ELIQUIS 2.5 MG TABS tablet [id number] Yes TAKE 1 TABLET BY MOUTH TWICE DAILY Iran Ouch, MD Taking Active   glucosamine-chondroitin 500-400 MG tablet [id number] Yes Take 1 tablet by mouth daily.  [provider] Taking Active   Latanoprostene Bunod 0.024 % SOLN [id number] Yes Apply 1 drop to eye at bedtime. [provider] Taking Active            Med Note Clydie Braun, Lavern Crimi B   Fri Nov 11, 2022  2:35 PM) Vyzulta eye drops  metoprolol succinate (TOPROL-XL) 100 MG 24 hr tablet [id number] Yes TAKE 1 TABLET(100 MG) BY MOUTH DAILY WITH OR IMMEDIATELY FOLLOWING A MEAL Iran Ouch, MD Taking Active   Red Yeast Rice 600 MG TABS [id number] Yes Take 1 tablet by mouth daily. [provider] Taking Active   timolol (TIMOPTIC) 0.5 % ophthalmic solution [id number] Yes 1 drop every morning. [provider] Taking Active             Patient Active Problem List   Diagnosis Date Noted   Change in bowel function 02/01/2022   Hyperlipidemia 04/14/2020   Seasonal allergies 11/27/2019   Atrial fibrillation (HCC) 03/26/2019   Anticoagulated 03/26/2019   Loose stools 04/14/2015   Osteoarthritis 04/14/2015   Chronic flank pain 06/17/2013    Plantar fasciitis 05/25/2013   PAD (peripheral artery disease) (HCC) 03/07/2013   Pain in joint, lower leg 02/05/2013   CARDIAC MURMUR 06/29/2010   VARICOSE VEINS, LOWER EXTREMITIES 06/02/2010   PLANTAR WART, LEFT 04/03/2009   MOLE 04/03/2009   HIP PAIN, RIGHT 04/03/2009   Dyslipidemia, goal LDL below 70 06/29/2007   ARTIFICIAL MENOPAUSE 06/29/2007   Pain in limb 06/29/2007   BREAST BIOPSY, HX OF 06/29/2007   GLAUCOMA, LEFT EYE 04/27/2007   Essential hypertension 04/27/2007   Osteoporosis 04/27/2007     Medication Assistance:  None required.  Patient affirms current coverage meets needs.   Assessment / Plan: Hypertension: Controlled Continue amlodipine 5mg  daily and metoprolol sucinate 100mg  daily.   Hyperlipidemia: Statin intolerant. Last LDL was improved Continue Red Yeast Rice daily.   Afib: well controlled.  Continue Eliquis 2.5 mg twice a day for stroke prevention and metoprolol for HR control.  (Dose of Eliquis has been adjusted for age > 80yo and weight < 60 kg) Reviewed signs and symptoms of bleeding -  patient to continue to monitor and notify office if any concerns.   Medication management:  Reviewed and updated medication list Reviewed refill history and adherence Patient is in Medicare coverage gap. I discussed applying for medication assistance program for Eliquis and Vyzulta but patient declined.   Reminded patient to stay active - goal is 150 minutes.  Reminded to hydrate during day especially when she is doing yard work.  Discussed getting medic alert system - patient declined.    Henrene Pastor, PharmD Clinical Pharmacist [facility] Primary Care  - [facility] [phone]

## 2023-09-05 ENCOUNTER — Ambulatory Visit: Payer: Medicare Other | Admitting: Family Medicine

## 2023-09-05 ENCOUNTER — Encounter: Payer: Self-pay | Admitting: Family Medicine

## 2023-09-05 ENCOUNTER — Ambulatory Visit (INDEPENDENT_AMBULATORY_CARE_PROVIDER_SITE_OTHER): Payer: Medicare Other | Admitting: Family Medicine

## 2023-09-05 VITALS — BP 110/80 | HR 66 | Temp 97.7°F | Resp 18 | Ht 62.0 in | Wt 114.0 lb

## 2023-09-05 DIAGNOSIS — E559 Vitamin D deficiency, unspecified: Secondary | ICD-10-CM

## 2023-09-05 DIAGNOSIS — I739 Peripheral vascular disease, unspecified: Secondary | ICD-10-CM

## 2023-09-05 DIAGNOSIS — Z23 Encounter for immunization: Secondary | ICD-10-CM

## 2023-09-05 DIAGNOSIS — I10 Essential (primary) hypertension: Secondary | ICD-10-CM

## 2023-09-05 DIAGNOSIS — E785 Hyperlipidemia, unspecified: Secondary | ICD-10-CM | POA: Diagnosis not present

## 2023-09-05 DIAGNOSIS — I8390 Asymptomatic varicose veins of unspecified lower extremity: Secondary | ICD-10-CM | POA: Diagnosis not present

## 2023-09-05 DIAGNOSIS — I4821 Permanent atrial fibrillation: Secondary | ICD-10-CM | POA: Diagnosis not present

## 2023-09-05 LAB — COMPREHENSIVE METABOLIC PANEL
ALT: 20 U/L (ref 0–35)
AST: 22 U/L (ref 0–37)
Albumin: 4.4 g/dL (ref 3.5–5.2)
Alkaline Phosphatase: 68 U/L (ref 39–117)
BUN: 15 mg/dL (ref 6–23)
CO2: 31 meq/L (ref 19–32)
Calcium: 9.7 mg/dL (ref 8.4–10.5)
Chloride: 100 meq/L (ref 96–112)
Creatinine, Ser: 0.7 mg/dL (ref 0.40–1.20)
GFR: 75.48 mL/min (ref >60.00)
Glucose, Bld: 99 mg/dL (ref 70–99)
Potassium: 4.2 meq/L (ref 3.5–5.1)
Sodium: 137 meq/L (ref 135–145)
Total Bilirubin: 0.9 mg/dL (ref 0.2–1.2)
Total Protein: 7.7 g/dL (ref 6.0–8.3)

## 2023-09-05 LAB — CBC WITH DIFFERENTIAL/PLATELET
Basophils Absolute: 0 10*3/uL (ref 0.0–0.1)
Basophils Relative: 0.9 % (ref 0.0–3.0)
Eosinophils Absolute: 0.1 10*3/uL (ref 0.0–0.7)
Eosinophils Relative: 1.6 % (ref 0.0–5.0)
HCT: 43.1 % (ref 36.0–46.0)
Hemoglobin: 14 g/dL (ref 12.0–15.0)
Lymphocytes Relative: 37.6 % (ref 12.0–46.0)
Lymphs Abs: 2.1 10*3/uL (ref 0.7–4.0)
MCHC: 32.5 g/dL (ref 30.0–36.0)
MCV: 95.9 fL (ref 78.0–100.0)
Monocytes Absolute: 0.7 10*3/uL (ref 0.1–1.0)
Monocytes Relative: 13.1 % — ABNORMAL HIGH (ref 3.0–12.0)
Neutro Abs: 2.6 10*3/uL (ref 1.4–7.7)
Neutrophils Relative %: 46.8 % (ref 43.0–77.0)
Platelets: 178 10*3/uL (ref 150.0–400.0)
RBC: 4.49 Mil/uL (ref 3.87–5.11)
RDW: 13.9 % (ref 11.5–15.5)
WBC: 5.5 10*3/uL (ref 4.0–10.5)

## 2023-09-05 LAB — VITAMIN D 25 HYDROXY (VIT D DEFICIENCY, FRACTURES): VITD: 58.38 ng/mL (ref 30.00–100.00)

## 2023-09-05 LAB — LIPID PANEL
Cholesterol: 206 mg/dL — ABNORMAL HIGH (ref 0–200)
HDL: 67.9 mg/dL (ref >39.00)
LDL Cholesterol: 116 mg/dL — ABNORMAL HIGH (ref 0–99)
NonHDL: 138.43
Total CHOL/HDL Ratio: 3
Triglycerides: 113 mg/dL (ref 0.0–149.0)
VLDL: 22.6 mg/dL (ref 0.0–40.0)

## 2023-09-05 LAB — TSH: TSH: 2.47 u[IU]/mL (ref 0.35–5.50)

## 2023-09-05 NOTE — Assessment & Plan Note (Signed)
Stable Per cardiology 

## 2023-09-05 NOTE — Assessment & Plan Note (Signed)
Encourage heart healthy diet such as MIND or DASH diet, increase exercise, avoid trans fats, simple carbohydrates and processed foods, consider a krill or fish or flaxseed oil cap daily.  °

## 2023-09-05 NOTE — Assessment & Plan Note (Signed)
Per cardioloy

## 2023-09-05 NOTE — Assessment & Plan Note (Signed)
Well controlled, no changes to meds. Encouraged heart healthy diet such as the DASH diet and exercise as tolerated.  °

## 2023-09-05 NOTE — Patient Instructions (Addendum)
Use flonase and /or astepro over the counter Take your allegra everyday         DASH Eating Plan DASH stands for Dietary Approaches to Stop Hypertension. The DASH eating plan is a healthy eating plan that has been shown to: Lower high blood pressure (hypertension). Reduce your risk for type 2 diabetes, heart disease, and stroke. Help with weight loss. What are tips for following this plan? Reading food labels Check food labels for the amount of salt (sodium) per serving. Choose foods with less than 5 percent of the Daily Value (DV) of sodium. In general, foods with less than 300 milligrams (mg) of sodium per serving fit into this eating plan. To find whole grains, look for the word "whole" as the first word in the ingredient list. Shopping Buy products labeled as "low-sodium" or "no salt added." Buy fresh foods. Avoid canned foods and pre-made or frozen meals. Cooking Try not to add salt when you cook. Use salt-free seasonings or herbs instead of table salt or sea salt. Check with your health care provider or pharmacist before using salt substitutes. Do not fry foods. Cook foods in healthy ways, such as baking, boiling, grilling, roasting, or broiling. Cook using oils that are good for your heart. These include olive, canola, avocado, soybean, and sunflower oil. Meal planning  Eat a balanced diet. This should include: 4 or more servings of fruits and 4 or more servings of vegetables each day. Try to fill half of your plate with fruits and vegetables. 6-8 servings of whole grains each day. 6 or less servings of lean meat, poultry, or fish each day. 1 oz is 1 serving. A 3 oz (85 g) serving of meat is about the same size as the palm of your hand. One egg is 1 oz (28 g). 2-3 servings of low-fat dairy each day. One serving is 1 cup (237 mL). 1 serving of nuts, seeds, or beans 5 times each week. 2-3 servings of heart-healthy fats. Healthy fats called omega-3 fatty acids are found in foods  such as walnuts, flaxseeds, fortified milks, and eggs. These fats are also found in cold-water fish, such as sardines, salmon, and mackerel. Limit how much you eat of: Canned or prepackaged foods. Food that is high in trans fat, such as fried foods. Food that is high in saturated fat, such as fatty meat. Desserts and other sweets, sugary drinks, and other foods with added sugar. Full-fat dairy products. Do not salt foods before eating. Do not eat more than 4 egg yolks a week. Try to eat at least 2 vegetarian meals a week. Eat more home-cooked food and less restaurant, buffet, and fast food. Lifestyle When eating at a restaurant, ask if your food can be made with less salt or no salt. If you drink alcohol: Limit how much you have to: 0-1 drink a day if you are female. 0-2 drinks a day if you are female. Know how much alcohol is in your drink. In the [place] one drink is one 12 oz bottle of beer (355 mL), one 5 oz glass of wine (148 mL), or one 1 oz glass of hard liquor (44 mL). General information Avoid eating more than 2,300 mg of salt a day. If you have hypertension, you may need to reduce your sodium intake to 1,500 mg a day. Work with your provider to stay at a healthy body weight or lose weight. Ask what the best weight range is for you. On most days of the week,  get at least 30 minutes of exercise that causes your heart to beat faster. This may include walking, swimming, or biking. Work with your provider or dietitian to adjust your eating plan to meet your specific calorie needs. What foods should I eat? Fruits All fresh, dried, or frozen fruit. Canned fruits that are in their natural juice and do not have sugar added to them. Vegetables Fresh or frozen vegetables that are raw, steamed, roasted, or grilled. Low-sodium or reduced-sodium tomato and vegetable juice. Low-sodium or reduced-sodium tomato sauce and tomato paste. Low-sodium or reduced-sodium canned  vegetables. Grains Whole-grain or whole-wheat bread. Whole-grain or whole-wheat pasta. Brown rice. Orpah Cobb. Bulgur. Whole-grain and low-sodium cereals. Pita bread. Low-fat, low-sodium crackers. Whole-wheat flour tortillas. Meats and other proteins Skinless chicken or [place]. Ground chicken or [place]. Pork with fat trimmed off. Fish and seafood. Egg whites. Dried beans, peas, or lentils. Unsalted nuts, nut butters, and seeds. Unsalted canned beans. Lean cuts of beef with fat trimmed off. Low-sodium, lean precooked or cured meat, such as sausages or meat loaves. Dairy Low-fat (1%) or fat-free (skim) milk. Reduced-fat, low-fat, or fat-free cheeses. Nonfat, low-sodium ricotta or cottage cheese. Low-fat or nonfat yogurt. Low-fat, low-sodium cheese. Fats and oils Soft margarine without trans fats. Vegetable oil. Reduced-fat, low-fat, or light mayonnaise and salad dressings (reduced-sodium). Canola, safflower, olive, avocado, soybean, and sunflower oils. Avocado. Seasonings and condiments Herbs. Spices. Seasoning mixes without salt. Other foods Unsalted popcorn and pretzels. Fat-free sweets. The items listed above may not be all the foods and drinks you can have. Talk to a dietitian to learn more. What foods should I avoid? Fruits Canned fruit in a light or heavy syrup. Fried fruit. Fruit in cream or butter sauce. Vegetables Creamed or fried vegetables. Vegetables in a cheese sauce. Regular canned vegetables that are not marked as low-sodium or reduced-sodium. Regular canned tomato sauce and paste that are not marked as low-sodium or reduced-sodium. Regular tomato and vegetable juices that are not marked as low-sodium or reduced-sodium. Rosita Fire. Olives. Grains Baked goods made with fat, such as croissants, muffins, or some breads. Dry pasta or rice meal packs. Meats and other proteins Fatty cuts of meat. Ribs. Fried meat. Tomasa Blase. Bologna, salami, and other precooked or cured meats, such as  sausages or meat loaves, that are not lean and low in sodium. Fat from the back of a pig (fatback). Bratwurst. Salted nuts and seeds. Canned beans with added salt. Canned or smoked fish. Whole eggs or egg yolks. Chicken or [place] with skin. Dairy Whole or 2% milk, cream, and half-and-half. Whole or full-fat cream cheese. Whole-fat or sweetened yogurt. Full-fat cheese. Nondairy creamers. Whipped toppings. Processed cheese and cheese spreads. Fats and oils Butter. Stick margarine. Lard. Shortening. Ghee. Bacon fat. Tropical oils, such as coconut, palm kernel, or palm oil. Seasonings and condiments Onion salt, garlic salt, seasoned salt, table salt, and sea salt. Worcestershire sauce. Tartar sauce. Barbecue sauce. Teriyaki sauce. Soy sauce, including reduced-sodium soy sauce. Steak sauce. Canned and packaged gravies. Fish sauce. Oyster sauce. Cocktail sauce. Store-bought horseradish. Ketchup. Mustard. Meat flavorings and tenderizers. Bouillon cubes. Hot sauces. Pre-made or packaged marinades. Pre-made or packaged taco seasonings. Relishes. Regular salad dressings. Other foods Salted popcorn and pretzels. The items listed above may not be all the foods and drinks you should avoid. Talk to a dietitian to learn more. Where to find more information National Heart, Lung, and Blood Institute (NHLBI): BuffaloDryCleaner.gl American Heart Association (AHA): heart.org Academy of Nutrition and Dietetics: eatright.org [employer] (  NKF): kidney.org This information is not intended to replace advice given to you by your health care provider. Make sure you discuss any questions you have with your health care provider. Document Revised: 12/01/2022 Document Reviewed: 12/01/2022 Elsevier Patient Education  2024 [employer].

## 2023-09-05 NOTE — Progress Notes (Signed)
Established Patient Office Visit  Subjective   Patient ID: Evelyn [name], female    DOB: Nov 30, 1929  Age: 89 y.o. MRN: [id number]  Chief Complaint  Patient presents with   Hypertension   Follow-up    HPI Discussed the use of AI scribe software for clinical note transcription with the patient, who gave verbal consent to proceed.  History of Present Illness   The patient, with a history of peripheral artery disease (PAD) and atrial fibrillation (AFib), presents with concerns about the appearance of her legs and feet due to varicose veins. She reports that the veins have worsened over the past year, but denies any associated pain. She has not seen her vascular specialist, Dr. Maylon Peppers, in a year.  Additionally, the patient reports feeling congested, particularly in the ears, and has a runny nose that lasts until lunchtime. She is unsure if this is due to wax build-up or allergies. She denies any hearing loss but mentions occasionally missing words in conversation. She also reports a slight feeling of being off-balance, which she suspects may be related to the congestion.  The patient also mentions unintentional weight loss of about five pounds, but does not express any concern about this. She lives alone and suggests that her eating habits may not be as good as when she lived with others.      Patient Active Problem List   Diagnosis Date Noted   Change in bowel function 02/01/2022   Hyperlipidemia 04/14/2020   Seasonal allergies 11/27/2019   Atrial fibrillation (HCC) 03/26/2019   Anticoagulated 03/26/2019   Loose stools 04/14/2015   Osteoarthritis 04/14/2015   Chronic flank pain 06/17/2013   Plantar fasciitis 05/25/2013   PAD (peripheral artery disease) (HCC) 03/07/2013   Pain in joint, lower leg 02/05/2013   CARDIAC MURMUR 06/29/2010   VARICOSE VEINS, LOWER EXTREMITIES 06/02/2010   PLANTAR WART, LEFT 04/03/2009   Benign neoplasm of skin 04/03/2009   HIP PAIN, RIGHT 04/03/2009    Dyslipidemia, goal LDL below 70 06/29/2007   ARTIFICIAL MENOPAUSE 06/29/2007   Pain in limb 06/29/2007   Personal history presenting hazards to health 06/29/2007   Unspecified glaucoma 04/27/2007   Essential hypertension 04/27/2007   Osteoporosis 04/27/2007   Past Medical History:  Diagnosis Date   Atrial fibrillation (HCC)    Hyperlipidemia    Hypertension    Osteoporosis    Post-menopausal    HRT   Past Surgical History:  Procedure Laterality Date   ABDOMINAL HYSTERECTOMY     BREAST LUMPECTOMY  11/28/1973   EYE SURGERY     Cataract removal   Social History   Tobacco Use   Smoking status: Former    Current packs/day: 0.00    Average packs/day: 0.8 packs/day for 18.0 years (13.5 ttl pk-yrs)    Types: Cigarettes    Start date: 02/21/1955    Quit date: 02/20/1973    Years since quitting: 50.5   Smokeless tobacco: Never  Substance Use Topics   Alcohol use: Yes   Drug use: No   Social History   Socioeconomic History   Marital status: Widowed    Spouse name: Not on file   Number of children: Not on file   Years of education: Not on file   Highest education level: Not on file  Occupational History   Not on file  Tobacco Use   Smoking status: Former    Current packs/day: 0.00    Average packs/day: 0.8 packs/day for 18.0 years (13.5 ttl pk-yrs)  Types: Cigarettes    Start date: 02/21/1955    Quit date: 02/20/1973    Years since quitting: 50.5   Smokeless tobacco: Never  Substance and Sexual Activity   Alcohol use: Yes   Drug use: No   Sexual activity: Yes    Partners: Male  Other Topics Concern   Not on file  Social History Narrative   Exercise--yard work, house work   International aid/development worker of Corporate investment banker Strain: Low Risk  (03/17/2023)   Overall Financial Resource Strain (CARDIA)    Difficulty of Paying Living Expenses: Not hard at all  Food Insecurity: No Food Insecurity (02/22/2023)   Hunger Vital Sign    Worried About Running Out of Food  in the Last Year: Never true    Ran Out of Food in the Last Year: Never true  Transportation Needs: No Transportation Needs (02/22/2023)   PRAPARE - Administrator, Civil Service (Medical): No    Lack of Transportation (Non-Medical): No  Physical Activity: Insufficiently Active (03/17/2023)   Exercise Vital Sign    Days of Exercise per Week: 3 days    Minutes of Exercise per Session: 10 min  Stress: No Stress Concern Present (09/21/2021)   [employer] of Occupational Health - Occupational Stress Questionnaire    Feeling of Stress : Not at all  Social Connections: Moderately Integrated (02/22/2023)   Social Connection and Isolation Panel [NHANES]    Frequency of Communication with Friends and Family: More than three times a week    Frequency of Social Gatherings with Friends and Family: Once a week    Attends Religious Services: More than 4 times per year    Active Member of [employer] or Organizations: Yes    Attends Banker Meetings: More than 4 times per year    Marital Status: Widowed  Intimate Partner Violence: Not At Risk (02/22/2023)   Humiliation, Afraid, Rape, and Kick questionnaire    Fear of Current or Ex-Partner: No    Emotionally Abused: No    Physically Abused: No    Sexually Abused: No   Family Status  Relation Name Status   Mother  Deceased at age 88   Father  Deceased at age 71   Sister  Alive   Brother  Deceased at age 33       lung cancer---nonsmoker   Sister  Alive  No partnership data on file   Family History  Problem Relation Age of Onset   Arthritis Mother    Heart disease Mother    Heart attack Father 42   Prostate cancer Father    COPD Father    Glaucoma Father    Hypertension Sister    COPD Sister    Arthritis Sister    Allergies  Allergen Reactions   Vicodin [Hydrocodone-Acetaminophen] Other (See Comments)    Abdominal Pain   Pletaal [Cilostazol] Diarrhea   Statins       ROS    Objective:     BP 110/80 (BP  Location: Right Arm, Patient Position: Sitting, Cuff Size: Normal)   Pulse 66   Temp 97.7 F (36.5 C) (Oral)   Resp 18   Ht 5\' 2"  (1.575 m)   Wt 114 lb (51.7 kg)   SpO2 98%   BMI 20.85 kg/m  BP Readings from Last 3 Encounters:  09/05/23 110/80  02/28/23 118/60  09/27/22 134/72   Wt Readings from Last 3 Encounters:  09/05/23 114 lb (51.7 kg)  02/28/23  120 lb 12.8 oz (54.8 kg)  09/27/22 122 lb 3.2 oz (55.4 kg)   SpO2 Readings from Last 3 Encounters:  09/05/23 98%  02/28/23 95%  09/27/22 90%      Physical Exam   No results found for any visits on 09/05/23.  Last CBC Lab Results  Component Value Date   WBC 7.2 02/28/2023   HGB 13.3 02/28/2023   HCT 39.8 02/28/2023   MCV 95.2 02/28/2023   MCH 30.3 07/23/2019   RDW 14.1 02/28/2023   PLT 172.0 02/28/2023   Last metabolic panel Lab Results  Component Value Date   GLUCOSE 109 (H) 02/28/2023   NA 139 02/28/2023   K 4.1 02/28/2023   CL 101 02/28/2023   CO2 31 02/28/2023   BUN 12 02/28/2023   CREATININE 0.77 02/28/2023   GFR 67.56 02/28/2023   CALCIUM 9.5 02/28/2023   PROT 7.6 02/28/2023   ALBUMIN 4.2 02/28/2023   BILITOT 0.6 02/28/2023   ALKPHOS 67 02/28/2023   AST 21 02/28/2023   ALT 19 02/28/2023   Last lipids Lab Results  Component Value Date   CHOL 184 02/28/2023   HDL 63.40 02/28/2023   LDLCALC 85 02/28/2023   LDLDIRECT 130.5 03/26/2013   TRIG 175.0 (H) 02/28/2023   CHOLHDL 3 02/28/2023   Last hemoglobin A1c No results found for: "HGBA1C" Last thyroid functions Lab Results  Component Value Date   TSH 2.68 02/28/2023   Last vitamin D Lab Results  Component Value Date   VD25OH 68.87 02/28/2023   Last vitamin B12 and Folate No results found for: "VITAMINB12", "FOLATE"    The ASCVD Risk score (Arnett DK, et al., 2019) failed to calculate for the following reasons:   The 2019 ASCVD risk score is only valid for ages 61 to 15    Assessment & Plan:   Problem List Items Addressed This  Visit       Unprioritized   Dyslipidemia, goal LDL below 70   Relevant Orders   Comprehensive metabolic panel   Lipid panel   VARICOSE VEINS, LOWER EXTREMITIES    Pt does not want to see specialist She will wear compression stockings when it cools off some more        PAD (peripheral artery disease) (HCC)    Per cardioloy      Hyperlipidemia    Encourage heart healthy diet such as MIND or DASH diet, increase exercise, avoid trans fats, simple carbohydrates and processed foods, consider a krill or fish or flaxseed oil cap daily.        Essential hypertension    Well controlled, no changes to meds. Encouraged heart healthy diet such as the DASH diet and exercise as tolerated.        Relevant Orders   CBC with Differential/Platelet   Comprehensive metabolic panel   TSH   Atrial fibrillation (HCC)    Stable  Per cardiology      Relevant Orders   TSH   Other Visit Diagnoses     Need for influenza vaccination    -  Primary   Relevant Orders   Flu Vaccine Trivalent High Dose (Fluad)   Vitamin D deficiency       Relevant Orders   VITAMIN D 25 Hydroxy (Vit-D Deficiency, Fractures)     Assessment and Plan    Varicose Veins Patient reports cosmetic concern about varicose veins on legs, ankles, and feet. No pain reported. Likely related to peripheral artery disease (PAD). -Continue monitoring. If pain develops, consider  referral to a vascular specialist. -Consider use of compression socks to prevent worsening.  Congestion/Allergies Patient reports feeling of congestion in both ears and frequent morning runny nose. No pain or hearing loss reported. Likely related to allergies. -Recommend use of over-the-counter nasal spray (e.g., Flonase) and possibly an antihistamine (e.g., Allegra) to manage symptoms. -Consider Debrox or Colace for ear wax softening if needed.  Unintentional Weight Loss Patient reports unintentional weight loss of about five pounds. No other  concerning symptoms reported. -Monitor weight. If further unintentional weight loss occurs, consider further evaluation.  General Health Maintenance -Administer influenza vaccine today. -Order routine lab work.        Return in about 6 months (around 03/05/2024), or if symptoms worsen or fail to improve, for annual exam, fasting.    Donato Schultz, DO

## 2023-09-05 NOTE — Assessment & Plan Note (Signed)
Pt does not want to see specialist She will wear compression stockings when it cools off some more

## 2023-10-02 ENCOUNTER — Other Ambulatory Visit: Payer: Self-pay | Admitting: Family Medicine

## 2023-10-02 DIAGNOSIS — I10 Essential (primary) hypertension: Secondary | ICD-10-CM

## 2023-10-10 ENCOUNTER — Ambulatory Visit: Payer: Medicare Other | Attending: Cardiovascular Disease | Admitting: Cardiovascular Disease

## 2023-10-10 ENCOUNTER — Encounter: Payer: Self-pay | Admitting: Cardiovascular Disease

## 2023-10-10 VITALS — BP 134/82 | HR 69 | Ht 62.0 in | Wt 113.0 lb

## 2023-10-10 DIAGNOSIS — I10 Essential (primary) hypertension: Secondary | ICD-10-CM | POA: Diagnosis not present

## 2023-10-10 DIAGNOSIS — E785 Hyperlipidemia, unspecified: Secondary | ICD-10-CM

## 2023-10-10 DIAGNOSIS — I4821 Permanent atrial fibrillation: Secondary | ICD-10-CM

## 2023-10-10 DIAGNOSIS — I739 Peripheral vascular disease, unspecified: Secondary | ICD-10-CM | POA: Diagnosis not present

## 2023-10-10 NOTE — Patient Instructions (Signed)
Medication Instructions:  No changes *If you need a refill on your cardiac medications before your next appointment, please call your pharmacy*   Lab Work: None ordered If you have labs (blood work) drawn today and your tests are completely normal, you will receive your results only by: MyChart Message (if you have MyChart) OR A paper copy in the mail If you have any lab test that is abnormal or we need to change your treatment, we will call you to review the results.   Testing/Procedures: None ordered   Follow-Up: At [facility], you and your health needs are our priority.  As part of our continuing mission to provide you with exceptional heart care, we have created designated Provider Care Teams.  These Care Teams include your primary Cardiologist (physician) and Advanced Practice Providers (APPs -  Physician Assistants and Nurse Practitioners) who all work together to provide you with the care you need, when you need it.  We recommend signing up for the patient portal called "MyChart".  Sign up information is provided on this After Visit Summary.  MyChart is used to connect with patients for Virtual Visits (Telemedicine).  Patients are able to view lab/test results, encounter notes, upcoming appointments, etc.  Non-urgent messages can be sent to your provider as well.   To learn more about what you can do with MyChart, go to [email or web address].    Your next appointment:   12 month(s)  Provider:   Muhammad Arida, MD       

## 2023-10-10 NOTE — Progress Notes (Unsigned)
Cardiology Office Note   Date:  10/12/2023   ID:  Evelyn [name], DOB 08-Aug-1930, MRN [id number]  PCP:  [name], Grayling Congress, DO  Cardiologist:   Evelyn Bears, MD   No chief complaint on file.    History of Present Illness: Evelyn [name] is a 89 y.o. female who presents for a followup visit regarding peripheral arterial disease and atrial fibrillation.   She has chronic medical conditions that include essential hypertension, hyperlipidemia and previous tobacco use.  She is known to have peripheral arterial disease with atypical claudication.  Noninvasive vascular studies done in 2014 showed focal stenosis of the left SFA with mildly reduced ABI. She is intolerant to statins.  She was diagnosed with atrial fibrillation in 2020.  She was started on Xarelto for anticoagulation.  She had an echocardiogram which showed normal LV systolic function, moderately dilated left atrium and no significant valvular abnormalities.  She had hematuria on Xarelto and was switched to Eliquis with subsequent improvement.   She has been doing well with no chest pain, shortness of breath or palpitations.  She is mostly bothered by varicose veins and lower extremity especially in the feet.  No claudication.   Past Medical History:  Diagnosis Date   Atrial fibrillation (HCC)    Hyperlipidemia    Hypertension    Osteoporosis    Post-menopausal    HRT    Past Surgical History:  Procedure Laterality Date   ABDOMINAL HYSTERECTOMY     BREAST LUMPECTOMY  11/28/1973   EYE SURGERY     Cataract removal     Current Outpatient Medications  Medication Sig Dispense Refill   amLODipine (NORVASC) 5 MG tablet Take 1 tablet (5 mg total) by mouth daily. 90 tablet 1   ascorbic acid (VITAMIN C) 500 MG tablet Take 500 mg by mouth daily.     calcium carbonate (OS-CAL) 600 MG TABS Take 600 mg by mouth daily.     Cholecalciferol (VITAMIN D PO) Take 1 capsule by mouth daily. 2000 units     ELIQUIS 2.5 MG TABS  tablet TAKE 1 TABLET BY MOUTH TWICE DAILY 180 tablet 1   glucosamine-chondroitin 500-400 MG tablet Take 1 tablet by mouth daily.      Latanoprostene Bunod 0.024 % SOLN Apply 1 drop to eye at bedtime.     metoprolol succinate (TOPROL-XL) 100 MG 24 hr tablet TAKE 1 TABLET(100 MG) BY MOUTH DAILY WITH OR IMMEDIATELY FOLLOWING A MEAL 90 tablet 2   Red Yeast Rice 600 MG TABS Take 1 tablet by mouth daily.     timolol (TIMOPTIC) 0.5 % ophthalmic solution 1 drop every morning.     No current facility-administered medications for this visit.    Allergies:   Vicodin [hydrocodone-acetaminophen], Pletaal [cilostazol], and Statins    Social History:  The patient  reports that she quit smoking about 50 years ago. Her smoking use included cigarettes. She started smoking about 68 years ago. She has a 13.5 pack-year smoking history. She has never used smokeless tobacco. She reports current alcohol use. She reports that she does not use drugs.   Family History:  The patient's family history includes Arthritis in her mother and sister; COPD in her father and sister; Glaucoma in her father; Heart attack (age of onset: 53) in her father; Heart disease in her mother; Hypertension in her sister; Prostate cancer in her father.      PHYSICAL EXAM: VS:  BP 134/82 (BP Location: Right Arm,  Patient Position: Sitting, Cuff Size: Normal)   Pulse 69   Ht 5\' 2"  (1.575 m)   Wt 113 lb (51.3 kg)   SpO2 94%   BMI 20.67 kg/m  , BMI Body mass index is 20.67 kg/m. GEN: Well nourished, well developed, in no acute distress  HEENT: normal  Neck: no JVD, carotid bruits, or masses Cardiac: Irregularly irregular; no murmurs, rubs, or gallops,no edema  Respiratory:  clear to auscultation bilaterally, normal work of breathing GI: soft, nontender, nondistended, + BS MS: no deformity or atrophy  Skin: warm and dry, no rash Neuro:  Strength and sensation are intact Psych: euthymic mood, full affect Distal pulses are not  palpable.   EKG:  EKG is  ordered today. EKG showed: Atrial fibrillation with a competing junctional pacemaker Left axis deviation Possible Anterior infarct , age undetermined   Recent Labs: 09/05/2023: ALT 20; BUN 15; Creatinine, Ser 0.70; Hemoglobin 14.0; Platelets 178.0; Potassium 4.2; Sodium 137; TSH 2.47    Lipid Panel    Component Value Date/Time   CHOL 206 (H) 09/05/2023 1057   TRIG 113.0 09/05/2023 1057   HDL 67.90 09/05/2023 1057   CHOLHDL 3 09/05/2023 1057   VLDL 22.6 09/05/2023 1057   LDLCALC 116 (H) 09/05/2023 1057   LDLDIRECT 130.5 03/26/2013 0916      Wt Readings from Last 3 Encounters:  10/10/23 113 lb (51.3 kg)  09/05/23 114 lb (51.7 kg)  02/28/23 120 lb 12.8 oz (54.8 kg)        ASSESSMENT AND PLAN:  1.  Permanent atrial fibrillation: Chads Vasc score is 5.  She is tolerating anticoagulation with Eliquis.  Rate is well controlled with Toprol 100 mg once daily.  I reviewed her labs done in March which showed normal CBC and normal renal function.  2. PAD: Currently with minimal claudication.  Continue medical therapy.  2.  Hyperlipidemia: She is intolerant to statins and has been on red yeast Rice.  Most recent lipid profile showed an LDL of 116.  3. Essential Hypertension: Blood pressure is well controlled on current medications.   Disposition:   FU in 12 months.  Signed, Evelyn Bears, MD  10/10/2023 1:55 PM    [facility]

## 2023-10-16 ENCOUNTER — Other Ambulatory Visit: Payer: Self-pay | Admitting: Cardiovascular Disease

## 2023-10-16 DIAGNOSIS — I10 Essential (primary) hypertension: Secondary | ICD-10-CM

## 2023-10-16 NOTE — Telephone Encounter (Signed)
Refill Request.  

## 2023-12-14 ENCOUNTER — Other Ambulatory Visit: Payer: Self-pay | Admitting: Cardiovascular Disease

## 2023-12-14 DIAGNOSIS — I4821 Permanent atrial fibrillation: Secondary | ICD-10-CM

## 2023-12-14 NOTE — Telephone Encounter (Signed)
Prescription refill request for Eliquis received. Indication: AF Last office visit: 10/10/23  Terrilee Files MD Scr: 0.70 on 09/05/23  Epic Age: 89 Weight: 51.3kg  Based on above findings Eliquis 2.5mg  twice daily is the appropriate dose.  Refill approved.

## 2023-12-14 NOTE — Telephone Encounter (Signed)
Please review

## 2024-01-12 ENCOUNTER — Telehealth: Payer: Self-pay | Admitting: Family Medicine

## 2024-01-12 NOTE — Telephone Encounter (Signed)
Copied from CRM [id number]. Topic: Medicare AWV >> Jan 12, 2024 11:21 AM Payton Doughty wrote: Reason for CRM: Called LVM 01/12/2024 to schedule AWV. Please schedule Virtual or Telehealth visits ONLY.   Verlee Rossetti; Care Guide Ambulatory Clinical Support [facility] Direct Dial: [phone]

## 2024-01-17 ENCOUNTER — Other Ambulatory Visit: Payer: Self-pay

## 2024-01-17 ENCOUNTER — Emergency Department (HOSPITAL_COMMUNITY): Payer: Medicare Other

## 2024-01-17 ENCOUNTER — Encounter (HOSPITAL_COMMUNITY): Payer: Self-pay

## 2024-01-17 ENCOUNTER — Inpatient Hospital Stay (HOSPITAL_COMMUNITY)
Admission: EM | Admit: 2024-01-17 | Discharge: 2024-01-23 | DRG: 481 | Disposition: A | Discharge status: Skilled Nursing Facility | Payer: Medicare Other | Attending: Internal Medicine | Admitting: Internal Medicine

## 2024-01-17 ENCOUNTER — Inpatient Hospital Stay (HOSPITAL_COMMUNITY): Payer: Medicare Other

## 2024-01-17 DIAGNOSIS — Z888 Allergy status to other drugs, medicaments and biological substances status: Secondary | ICD-10-CM

## 2024-01-17 DIAGNOSIS — E785 Hyperlipidemia, unspecified: Secondary | ICD-10-CM | POA: Diagnosis not present

## 2024-01-17 DIAGNOSIS — S199XXA Unspecified injury of neck, initial encounter: Secondary | ICD-10-CM | POA: Diagnosis not present

## 2024-01-17 DIAGNOSIS — N320 Bladder-neck obstruction: Secondary | ICD-10-CM | POA: Diagnosis not present

## 2024-01-17 DIAGNOSIS — R0902 Hypoxemia: Secondary | ICD-10-CM | POA: Diagnosis not present

## 2024-01-17 DIAGNOSIS — E569 Vitamin deficiency, unspecified: Secondary | ICD-10-CM | POA: Diagnosis present

## 2024-01-17 DIAGNOSIS — I517 Cardiomegaly: Secondary | ICD-10-CM | POA: Diagnosis not present

## 2024-01-17 DIAGNOSIS — I4819 Other persistent atrial fibrillation: Secondary | ICD-10-CM | POA: Diagnosis not present

## 2024-01-17 DIAGNOSIS — K5900 Constipation, unspecified: Secondary | ICD-10-CM | POA: Diagnosis not present

## 2024-01-17 DIAGNOSIS — Z79899 Other long term (current) drug therapy: Secondary | ICD-10-CM | POA: Diagnosis not present

## 2024-01-17 DIAGNOSIS — W11XXXA Fall on and from ladder, initial encounter: Secondary | ICD-10-CM | POA: Diagnosis present

## 2024-01-17 DIAGNOSIS — M25552 Pain in left hip: Secondary | ICD-10-CM | POA: Diagnosis not present

## 2024-01-17 DIAGNOSIS — S0990XA Unspecified injury of head, initial encounter: Secondary | ICD-10-CM | POA: Diagnosis not present

## 2024-01-17 DIAGNOSIS — M80052A Age-related osteoporosis with current pathological fracture, left femur, initial encounter for fracture: Secondary | ICD-10-CM | POA: Diagnosis not present

## 2024-01-17 DIAGNOSIS — Z602 Problems related to living alone: Secondary | ICD-10-CM | POA: Diagnosis not present

## 2024-01-17 DIAGNOSIS — R2689 Other abnormalities of gait and mobility: Secondary | ICD-10-CM | POA: Diagnosis not present

## 2024-01-17 DIAGNOSIS — I8390 Asymptomatic varicose veins of unspecified lower extremity: Secondary | ICD-10-CM | POA: Diagnosis not present

## 2024-01-17 DIAGNOSIS — R112 Nausea with vomiting, unspecified: Secondary | ICD-10-CM | POA: Diagnosis not present

## 2024-01-17 DIAGNOSIS — S299XXA Unspecified injury of thorax, initial encounter: Secondary | ICD-10-CM | POA: Diagnosis not present

## 2024-01-17 DIAGNOSIS — R2681 Unsteadiness on feet: Secondary | ICD-10-CM | POA: Diagnosis not present

## 2024-01-17 DIAGNOSIS — Z8249 Family history of ischemic heart disease and other diseases of the circulatory system: Secondary | ICD-10-CM

## 2024-01-17 DIAGNOSIS — W19XXXA Unspecified fall, initial encounter: Secondary | ICD-10-CM | POA: Diagnosis not present

## 2024-01-17 DIAGNOSIS — Z7901 Long term (current) use of anticoagulants: Secondary | ICD-10-CM

## 2024-01-17 DIAGNOSIS — Z825 Family history of asthma and other chronic lower respiratory diseases: Secondary | ICD-10-CM | POA: Diagnosis not present

## 2024-01-17 DIAGNOSIS — Z8042 Family history of malignant neoplasm of prostate: Secondary | ICD-10-CM

## 2024-01-17 DIAGNOSIS — Z885 Allergy status to narcotic agent status: Secondary | ICD-10-CM

## 2024-01-17 DIAGNOSIS — S72002A Fracture of unspecified part of neck of left femur, initial encounter for closed fracture: Secondary | ICD-10-CM | POA: Diagnosis not present

## 2024-01-17 DIAGNOSIS — D72829 Elevated white blood cell count, unspecified: Secondary | ICD-10-CM | POA: Diagnosis present

## 2024-01-17 DIAGNOSIS — Z83511 Family history of glaucoma: Secondary | ICD-10-CM | POA: Diagnosis not present

## 2024-01-17 DIAGNOSIS — Y92009 Unspecified place in unspecified non-institutional (private) residence as the place of occurrence of the external cause: Secondary | ICD-10-CM

## 2024-01-17 DIAGNOSIS — J302 Other seasonal allergic rhinitis: Secondary | ICD-10-CM | POA: Diagnosis not present

## 2024-01-17 DIAGNOSIS — Z90710 Acquired absence of both cervix and uterus: Secondary | ICD-10-CM

## 2024-01-17 DIAGNOSIS — J42 Unspecified chronic bronchitis: Secondary | ICD-10-CM | POA: Diagnosis not present

## 2024-01-17 DIAGNOSIS — I700 Atherosclerosis of aorta: Secondary | ICD-10-CM | POA: Diagnosis not present

## 2024-01-17 DIAGNOSIS — S72142A Displaced intertrochanteric fracture of left femur, initial encounter for closed fracture: Secondary | ICD-10-CM | POA: Diagnosis present

## 2024-01-17 DIAGNOSIS — E860 Dehydration: Secondary | ICD-10-CM | POA: Diagnosis not present

## 2024-01-17 DIAGNOSIS — M80852D Other osteoporosis with current pathological fracture, left femur, subsequent encounter for fracture with routine healing: Secondary | ICD-10-CM | POA: Diagnosis not present

## 2024-01-17 DIAGNOSIS — D239 Other benign neoplasm of skin, unspecified: Secondary | ICD-10-CM | POA: Diagnosis not present

## 2024-01-17 DIAGNOSIS — K567 Ileus, unspecified: Secondary | ICD-10-CM | POA: Diagnosis not present

## 2024-01-17 DIAGNOSIS — I739 Peripheral vascular disease, unspecified: Secondary | ICD-10-CM | POA: Diagnosis not present

## 2024-01-17 DIAGNOSIS — Z9181 History of falling: Secondary | ICD-10-CM | POA: Diagnosis not present

## 2024-01-17 DIAGNOSIS — Z8261 Family history of arthritis: Secondary | ICD-10-CM | POA: Diagnosis not present

## 2024-01-17 DIAGNOSIS — S0093XA Contusion of unspecified part of head, initial encounter: Secondary | ICD-10-CM | POA: Diagnosis present

## 2024-01-17 DIAGNOSIS — I10 Essential (primary) hypertension: Secondary | ICD-10-CM | POA: Diagnosis not present

## 2024-01-17 DIAGNOSIS — Z87891 Personal history of nicotine dependence: Secondary | ICD-10-CM | POA: Diagnosis not present

## 2024-01-17 DIAGNOSIS — M6281 Muscle weakness (generalized): Secondary | ICD-10-CM | POA: Diagnosis not present

## 2024-01-17 DIAGNOSIS — I4891 Unspecified atrial fibrillation: Secondary | ICD-10-CM | POA: Diagnosis not present

## 2024-01-17 DIAGNOSIS — M1990 Unspecified osteoarthritis, unspecified site: Secondary | ICD-10-CM | POA: Diagnosis not present

## 2024-01-17 DIAGNOSIS — Z9889 Other specified postprocedural states: Secondary | ICD-10-CM | POA: Diagnosis not present

## 2024-01-17 DIAGNOSIS — R011 Cardiac murmur, unspecified: Secondary | ICD-10-CM | POA: Diagnosis not present

## 2024-01-17 DIAGNOSIS — M85852 Other specified disorders of bone density and structure, left thigh: Secondary | ICD-10-CM | POA: Diagnosis not present

## 2024-01-17 DIAGNOSIS — S72142D Displaced intertrochanteric fracture of left femur, subsequent encounter for closed fracture with routine healing: Secondary | ICD-10-CM | POA: Diagnosis not present

## 2024-01-17 DIAGNOSIS — R1310 Dysphagia, unspecified: Secondary | ICD-10-CM | POA: Diagnosis not present

## 2024-01-17 LAB — CBC
HCT: 40 % (ref 36.0–46.0)
Hemoglobin: 13.3 g/dL (ref 12.0–15.0)
MCH: 31.3 pg (ref 26.0–34.0)
MCHC: 33.3 g/dL (ref 30.0–36.0)
MCV: 94.1 fL (ref 80.0–100.0)
Platelets: 212 K/uL (ref 150–400)
RBC: 4.25 MIL/uL (ref 3.87–5.11)
RDW: 13.2 % (ref 11.5–15.5)
WBC: 17.9 K/uL — ABNORMAL HIGH (ref 4.0–10.5)
nRBC: 0 % (ref 0.0–0.2)

## 2024-01-17 LAB — I-STAT CHEM 8, ED
BUN: 16 mg/dL (ref 8–23)
Calcium, Ion: 1.17 mmol/L (ref 1.15–1.40)
Chloride: 99 mmol/L (ref 98–111)
Creatinine, Ser: 0.9 mg/dL (ref 0.44–1.00)
Glucose, Bld: 134 mg/dL — ABNORMAL HIGH (ref 70–99)
HCT: 42 % (ref 36.0–46.0)
Hemoglobin: 14.3 g/dL (ref 12.0–15.0)
Potassium: 4.1 mmol/L (ref 3.5–5.1)
Sodium: 138 mmol/L (ref 135–145)
TCO2: 29 mmol/L (ref 22–32)

## 2024-01-17 LAB — SAMPLE TO BLOOD BANK

## 2024-01-17 LAB — COMPREHENSIVE METABOLIC PANEL WITH GFR
ALT: 18 U/L (ref 0–44)
AST: 24 U/L (ref 15–41)
Albumin: 3.6 g/dL (ref 3.5–5.0)
Alkaline Phosphatase: 50 U/L (ref 38–126)
Anion gap: 13 (ref 5–15)
BUN: 13 mg/dL (ref 8–23)
CO2: 26 mmol/L (ref 22–32)
Calcium: 9.5 mg/dL (ref 8.9–10.3)
Chloride: 98 mmol/L (ref 98–111)
Creatinine, Ser: 0.84 mg/dL (ref 0.44–1.00)
GFR, Estimated: >60 mL/min (ref >60)
Glucose, Bld: 136 mg/dL — ABNORMAL HIGH (ref 70–99)
Potassium: 4.1 mmol/L (ref 3.5–5.1)
Sodium: 137 mmol/L (ref 135–145)
Total Bilirubin: 0.6 mg/dL (ref 0.0–1.2)
Total Protein: 7.8 g/dL (ref 6.5–8.1)

## 2024-01-17 LAB — ETHANOL: Alcohol, Ethyl (B): <10 mg/dL (ref <10)

## 2024-01-17 LAB — TYPE AND SCREEN
ABO/RH(D): A POS
Antibody Screen: NEGATIVE

## 2024-01-17 LAB — PROTIME-INR
INR: 1.2 (ref 0.8–1.2)
Prothrombin Time: 15.8 s — ABNORMAL HIGH (ref 11.4–15.2)

## 2024-01-17 LAB — I-STAT CG4 LACTIC ACID, ED: Lactic Acid, Venous: 1.5 mmol/L (ref 0.5–1.9)

## 2024-01-17 MED ORDER — ONDANSETRON HCL 4 MG/2ML IJ SOLN
4.0000 mg | Freq: Once | INTRAMUSCULAR | Status: AC
  Administered 2024-01-17: 4 mg via INTRAVENOUS
  Filled 2024-01-17: qty 2

## 2024-01-17 MED ORDER — FENTANYL CITRATE PF 50 MCG/ML IJ SOSY
25.0000 ug | PREFILLED_SYRINGE | INTRAMUSCULAR | Status: DC | PRN
  Administered 2024-01-18 – 2024-01-20 (×13): 25 ug via INTRAVENOUS
  Filled 2024-01-17 (×13): qty 1

## 2024-01-17 MED ORDER — ACETAMINOPHEN 650 MG RE SUPP: 650.0000 mg | Freq: Four times a day (QID) | RECTAL | Status: DC | PRN

## 2024-01-17 MED ORDER — NALOXONE HCL 0.4 MG/ML IJ SOLN: 0.4000 mg | INTRAMUSCULAR | Status: DC | PRN

## 2024-01-17 MED ORDER — MELATONIN 3 MG PO TABS
3.0000 mg | ORAL_TABLET | Freq: Every evening | ORAL | Status: DC | PRN
  Administered 2024-01-21: 3 mg via ORAL
  Filled 2024-01-17 (×2): qty 1

## 2024-01-17 MED ORDER — ONDANSETRON HCL 4 MG/2ML IJ SOLN
4.0000 mg | Freq: Four times a day (QID) | INTRAMUSCULAR | Status: DC | PRN
  Administered 2024-01-18 – 2024-01-22 (×4): 4 mg via INTRAVENOUS
  Filled 2024-01-17 (×4): qty 2

## 2024-01-17 MED ORDER — METOPROLOL SUCCINATE ER 50 MG PO TB24
100.0000 mg | ORAL_TABLET | Freq: Every day | ORAL | Status: DC
  Administered 2024-01-18: 100 mg via ORAL
  Filled 2024-01-17: qty 2
  Filled 2024-01-17: qty 1

## 2024-01-17 MED ORDER — ACETAMINOPHEN 325 MG PO TABS
650.0000 mg | ORAL_TABLET | Freq: Four times a day (QID) | ORAL | Status: DC | PRN
  Administered 2024-01-20 – 2024-01-21 (×3): 650 mg via ORAL
  Filled 2024-01-17 (×3): qty 2

## 2024-01-17 MED ORDER — FENTANYL CITRATE PF 50 MCG/ML IJ SOSY
50.0000 ug | PREFILLED_SYRINGE | INTRAMUSCULAR | Status: DC | PRN
  Administered 2024-01-17: 50 ug via INTRAVENOUS
  Filled 2024-01-17: qty 1

## 2024-01-17 NOTE — H&P (Addendum)
 History and Physical      Evelyn [name] WUJ:[id number] DOB: February 18, 1930 DOA: 01/17/2024; DOS: 01/17/2024  PCP: Evelyn Schultz, DO  Patient coming from: home   I have personally briefly reviewed patient's old medical records in [facility]  Chief Complaint: fall  HPI: Evelyn [name] is a 89 y.o. female with medical history significant for persistent atrial fibrillation chronically anticoagulated on Eliquis, essential hypertension, who is admitted to [facility] on 01/17/2024 with acute left intertrochanteric hip fracture after presenting from home to [facility] ED complaining of fall.   The patient, who lives independently at home, reports that she was on the second rung of a ladder at home, rearranging decorations on the mantle of her fireplace when she slipped resulting in a fall from the second rung of the ladder to the floor below, in which the left hip was the principal point of contact with the floor.  As a result of this fall, the patient reports immediate development of sharp left hip pain, with radiation into the left groin. States that this pain has been constant since onset, with exacerbation when attempting to move the left lower extremity.  As a consequence of the associated intensity of this discomfort, reports that he is unable to bear weight on the left lower extremity at this time.  This is relative to baseline functional status in which the patient lives independently as well as baseline ambulatory status in which the patient reports the ability to ambulate without assistance, including no need for support devices.  Otherwise, denies any acute arthralgias or myalgias as a result of the above fall.  Denies any acute numbness or paresthesias in bilateral lower extremities, and confirms that left hip representations a native joint.  She notes that has component of the above fall, she did not hit her head on the floor, but denies any associated loss of consciousness.  She  denies any ensuing headache or new onset neck discomfort.  Denies any associated nausea, vomiting, dizziness, presyncope, or syncope.  Denies any preceding or associated chest pain, shortness of breath, diaphoresis, palpitations.  No acute change in vision.  In the setting of a history of persistent atrial fibrillation, she is chronically anticoagulated on Eliquis, with most recent such dose occurring on the morning of 01/17/2024.  Otherwise, she is on no additional blood thinners at home.  Denies any known history of CHF.  Additionally, denies any recent orthopnea, PND, or peripheral edema.     ED Course:  Vital signs in the ED were notable for the following: Afebrile; heart rates in the 70s to 80s; systolic blood pressures in the low 100s to 140s; respiratory rate 16-19, oxygen saturation 95 to 98% on room air.  Labs were notable for the following: CMP was notable for the following: Sodium 137, potassium 4.1, creatinine 0.84, glucose 136, liver enzymes were within normal limits.  Lactic acid 1.5.  CBC notable for white blood cell count 7 2900, hemoglobin 13.3, platelet count 212.  INR 1.2.  Urinalysis has been ordered, with result currently pending.  Per my interpretation, EKG in ED demonstrated the following: Atrial fibrillation with heart rate 79, and no evidence of T wave or ST changes, including no evidence of ST elevation.  Imaging in the ED, per corresponding formal radiology read, was notable for the following: Plain films of the left hip showed acute impacted left basicervical/intertrochanteric fracture.  1 view chest x-ray showed no evidence of acute cardiopulmonary process, including  no evidence of infiltrate, edema, effusion, or pneumothorax.  Noncontrast CT head showed no evidence of acute intracranial process, including no evidence of intracranial measuring any evidence of acute infarct.  CT cervical spine showed no evidence of acute cervical spine fracture or subluxation injury.  EDP  discussed case/imaging with on-call orthopedic surgeon, Dr. Rosalee Kaufman of the Sports Medicine group, who conveyed that he will formally consult and will see the patient in the morning, requesting that the patient be made n.p.o. at midnight in the interval.   While in the ED, the following were administered: Zofran 4 mg IV x 1 dose.  Subsequently, the patient was admitted for further evaluation management of acute left intertrochanteric hip fracture after mechanical fall at home.     Review of Systems: As per HPI otherwise 10 point review of systems negative.   Past Medical History:  Diagnosis Date   Atrial fibrillation (HCC)    Hyperlipidemia    Hypertension    Osteoporosis    Post-menopausal    HRT    Past Surgical History:  Procedure Laterality Date   ABDOMINAL HYSTERECTOMY     BREAST LUMPECTOMY  11/28/1973   EYE SURGERY     Cataract removal    Social History:  reports that she quit smoking about 50 years ago. Her smoking use included cigarettes. She started smoking about 68 years ago. She has a 13.5 pack-year smoking history. She has never used smokeless tobacco. She reports current alcohol use. She reports that she does not use drugs.   Allergies  Allergen Reactions   Vicodin [Hydrocodone-Acetaminophen] Other (See Comments)    Abdominal Pain   Pletaal [Cilostazol] Diarrhea   Statins     Family History  Problem Relation Age of Onset   Arthritis Mother    Heart disease Mother    Heart attack Father 22   Prostate cancer Father    COPD Father    Glaucoma Father    Hypertension Sister    COPD Sister    Arthritis Sister     Family history reviewed and not pertinent    Prior to Admission medications   Medication Sig Start Date End Date Taking? Authorizing Provider  amLODipine (NORVASC) 5 MG tablet Take 1 tablet (5 mg total) by mouth daily. 10/02/23   Evelyn Schultz, DO  ascorbic acid (VITAMIN C) 500 MG tablet Take 500 mg by mouth daily.    [provider]  calcium carbonate (OS-CAL) 600 MG TABS Take 600 mg by mouth daily.    [provider]  Cholecalciferol (VITAMIN D PO) Take 1 capsule by mouth daily. 2000 units    [provider]  ELIQUIS 2.5 MG TABS tablet TAKE 1 TABLET BY MOUTH TWICE DAILY 12/14/23   Iran Ouch, MD  glucosamine-chondroitin 500-400 MG tablet Take 1 tablet by mouth daily.     [provider]  Latanoprostene Bunod 0.024 % SOLN Apply 1 drop to eye at bedtime.    [provider]  metoprolol succinate (TOPROL-XL) 100 MG 24 hr tablet TAKE 1 TABLET(100 MG) BY MOUTH DAILY WITH OR IMMEDIATELY FOLLOWING A MEAL 10/18/23   Arida, Chelsea Aus, MD  Red Yeast Rice 600 MG TABS Take 1 tablet by mouth daily.    [provider]  timolol (TIMOPTIC) 0.5 % ophthalmic solution 1 drop every morning. 08/25/21   [provider]     Objective    Physical Exam: Vitals:   01/17/24 1740 01/17/24 1740 01/17/24 1830 01/17/24 1905  BP:  (!) 142/68 106/61   Pulse:  81 84 90  Resp:  19 16 19   Temp:  98 F (36.7 C)    SpO2:  98% 95% 93%  Weight: 54.4 kg     Height: 5\' 2"  (1.575 m)       General: appears to be stated age; alert, oriented Skin: warm, dry, no rash Head:  AT/[place] Mouth:  Oral mucosa membranes appear moist, normal dentition Neck: supple; trachea midline Heart: Irregular, but rate controlled; did not appreciate any M/R/G Lungs: CTAB, did not appreciate any wheezes, rales, or rhonchi Abdomen: + BS; soft, ND, NT Vascular: 2+ pedal pulses b/l; 2+ radial pulses b/l Extremities: no peripheral edema, no muscle wasting; left lower extremity appears externally rotated and shortened relative to right counterpart Neuro: sensation intact in upper and lower extremities b/l; in the setting of presenting acute left hip fracture, I deferred assessment of strength involving the left lower extremity.    Labs on Admission: I have personally reviewed following labs and imaging  studies  CBC: Recent Labs  Lab 01/17/24 1742 01/17/24 1809  WBC 17.9*  --   HGB 13.3 14.3  HCT 40.0 42.0  MCV 94.1  --   PLT 212  --    Basic Metabolic Panel: Recent Labs  Lab 01/17/24 1742 01/17/24 1809  NA 137 138  K 4.1 4.1  CL 98 99  CO2 26  --   GLUCOSE 136* 134*  BUN 13 16  CREATININE 0.84 0.90  CALCIUM 9.5  --    GFR: Estimated Creatinine Clearance: 31.5 mL/min (by C-G formula based on SCr of 0.9 mg/dL). Liver Function Tests: Recent Labs  Lab 01/17/24 1742  AST 24  ALT 18  ALKPHOS 50  BILITOT 0.6  PROT 7.8  ALBUMIN 3.6   No results for input(s): "LIPASE", "AMYLASE" in the last 168 hours. No results for input(s): "AMMONIA" in the last 168 hours. Coagulation Profile: Recent Labs  Lab 01/17/24 1742  INR 1.2   Cardiac Enzymes: No results for input(s): "CKTOTAL", "CKMB", "CKMBINDEX", "TROPONINI" in the last 168 hours. BNP (last 3 results) No results for input(s): "PROBNP" in the last 8760 hours. HbA1C: No results for input(s): "HGBA1C" in the last 72 hours. CBG: No results for input(s): "GLUCAP" in the last 168 hours. Lipid Profile: No results for input(s): "CHOL", "HDL", "LDLCALC", "TRIG", "CHOLHDL", "LDLDIRECT" in the last 72 hours. Thyroid Function Tests: No results for input(s): "TSH", "T4TOTAL", "FREET4", "T3FREE", "THYROIDAB" in the last 72 hours. Anemia Panel: No results for input(s): "VITAMINB12", "FOLATE", "FERRITIN", "TIBC", "IRON", "RETICCTPCT" in the last 72 hours. Urine analysis:    Component Value Date/Time   COLORURINE yellow 06/29/2010 0000   APPEARANCEUR Clear 06/29/2010 0000   LABSPEC 1.010 06/29/2010 0000   PHURINE 7.5 06/29/2010 0000   HGBUR negative 06/29/2010 0000   BILIRUBINUR Negative 02/14/2019 1218   PROTEINUR Negative 02/14/2019 1218   UROBILINOGEN negative (A) 02/14/2019 1218   UROBILINOGEN negative 06/29/2010 0000   NITRITE Negative 02/14/2019 1218   NITRITE negative 06/29/2010 0000   LEUKOCYTESUR Negative  02/14/2019 1218    Radiological Exams on Admission: CT HEAD WO CONTRAST Result Date: 01/17/2024 CLINICAL DATA:  Head trauma, moderate-severe; Polytrauma, blunt. Fall. EXAM: CT HEAD WITHOUT CONTRAST CT CERVICAL SPINE WITHOUT CONTRAST TECHNIQUE: Multidetector CT imaging of the head and cervical spine was performed following the standard protocol without intravenous contrast. Multiplanar CT image reconstructions of the cervical spine were also generated. RADIATION DOSE REDUCTION: This exam was performed according to  the departmental dose-optimization program which includes automated exposure control, adjustment of the mA and/or kV according to patient size and/or use of iterative reconstruction technique. COMPARISON:  None Available. FINDINGS: CT HEAD FINDINGS Brain: No acute hemorrhage. Cortical gray-white differentiation is preserved. Patchy hypoattenuation of the cerebral white matter, most consistent with mild chronic small-vessel disease. Prominence of the ventricles and sulci within normal limits for age. No extra-axial collection. Basilar cisterns are patent. Vascular: No hyperdense vessel or unexpected calcification. Skull: No calvarial fracture or suspicious bone lesion. Skull base is unremarkable. Sinuses/Orbits: No acute finding. Other: None. CT CERVICAL SPINE FINDINGS Alignment: Degenerative reversal of the normal cervical lordosis. No traumatic malalignment. Skull base and vertebrae: No acute fracture. Normal craniocervical junction. No suspicious bone lesions. Multilevel severe disc height loss with degenerative endplate changes and sclerosis from C3-C7. Soft tissues and spinal canal: No prevertebral fluid or swelling. No visible canal hematoma. Disc levels: Multilevel cervical spondylosis, worst at C5-6, where there is at least mild spinal canal stenosis. Upper chest: No acute findings. Other: None. IMPRESSION: 1. No acute intracranial abnormality. 2. No acute cervical spine fracture or traumatic  malalignment. 3. Multilevel cervical spondylosis, worst at C5-6, where there is at least mild spinal canal stenosis. Electronically Signed   By: Orvan Falconer M.D.   On: 01/17/2024 18:19   CT CERVICAL SPINE WO CONTRAST Result Date: 01/17/2024 CLINICAL DATA:  Head trauma, moderate-severe; Polytrauma, blunt. Fall. EXAM: CT HEAD WITHOUT CONTRAST CT CERVICAL SPINE WITHOUT CONTRAST TECHNIQUE: Multidetector CT imaging of the head and cervical spine was performed following the standard protocol without intravenous contrast. Multiplanar CT image reconstructions of the cervical spine were also generated. RADIATION DOSE REDUCTION: This exam was performed according to the departmental dose-optimization program which includes automated exposure control, adjustment of the mA and/or kV according to patient size and/or use of iterative reconstruction technique. COMPARISON:  None Available. FINDINGS: CT HEAD FINDINGS Brain: No acute hemorrhage. Cortical gray-white differentiation is preserved. Patchy hypoattenuation of the cerebral white matter, most consistent with mild chronic small-vessel disease. Prominence of the ventricles and sulci within normal limits for age. No extra-axial collection. Basilar cisterns are patent. Vascular: No hyperdense vessel or unexpected calcification. Skull: No calvarial fracture or suspicious bone lesion. Skull base is unremarkable. Sinuses/Orbits: No acute finding. Other: None. CT CERVICAL SPINE FINDINGS Alignment: Degenerative reversal of the normal cervical lordosis. No traumatic malalignment. Skull base and vertebrae: No acute fracture. Normal craniocervical junction. No suspicious bone lesions. Multilevel severe disc height loss with degenerative endplate changes and sclerosis from C3-C7. Soft tissues and spinal canal: No prevertebral fluid or swelling. No visible canal hematoma. Disc levels: Multilevel cervical spondylosis, worst at C5-6, where there is at least mild spinal canal stenosis.  Upper chest: No acute findings. Other: None. IMPRESSION: 1. No acute intracranial abnormality. 2. No acute cervical spine fracture or traumatic malalignment. 3. Multilevel cervical spondylosis, worst at C5-6, where there is at least mild spinal canal stenosis. Electronically Signed   By: Orvan Falconer M.D.   On: 01/17/2024 18:19   DG Chest Port 1 View Result Date: 01/17/2024 CLINICAL DATA:  Trauma EXAM: PORTABLE CHEST 1 VIEW COMPARISON:  05/04/2018 FINDINGS: Lung apices are incompletely included. There is cardiomegaly and aortic atherosclerosis. No consolidation, or pleural effusion. Chronic bronchitic changes IMPRESSION: No active disease allowing for incompletely included lung apices. Cardiomegaly. Electronically Signed   By: Jasmine Pang M.D.   On: 01/17/2024 18:09   DG Hip Unilat W or Wo Pelvis 2-3 Views Left Result Date: 01/17/2024  CLINICAL DATA:  Trauma left-sided hip pain EXAM: DG HIP (WITH OR WITHOUT PELVIS) 2-3V LEFT COMPARISON:  06/02/2010 FINDINGS: SI joints are non widened. Pubic symphysis and rami appear intact. Both femoral heads project in joint. Acute impacted basicervical/intertrochanteric fracture. Vascular calcifications IMPRESSION: Acute impacted left basicervical/intertrochanteric fracture. Electronically Signed   By: Jasmine Pang M.D.   On: 01/17/2024 18:08      Assessment/Plan   Principal Problem:   Closed left hip fracture [facility]) Active Problems:   Essential hypertension   Acute hip pain, left   Persistent atrial fibrillation (HCC)   Fall at home, initial encounter   Leukocytosis     #) Acute left intertrochanteric hip fracture: confirmed via presenting plain films and stemming from mechanical fall without associated loss of consciousness that occurred earlier on the day of admission, as further described above, resulting in immediate develop of acute left hip pain.  At this time, the left lower extremity appears neurovascularly intact, and patient reports adequate  pain control.  In the setting of persistent atrial fibrillation, she is chronically anticoagulated on Eliquis, with most recent such dose occurring on the morning of 01/17/2024.  Otherwise, not on any additional blood thinners at home.  Of note, presenting INR found to be 1.2.  EDP discussed case/imaging with on-call orthopedic surgeon, Dr. Rosalee Kaufman of the Sports Medicine group, who conveyed that he will formally consult and will see the patient in the morning, requesting that the patient be made n.p.o. at midnight in the interval.   Chales Abrahams Score for this patient in the context of anticipated aforementioned orthopedic surgery conveys a  0.3% perioperative risk for significant cardiac event. No evidence to suggest acutely decompensated heart failure or acute MI. Consequently, no absolute contraindications to proceeding with proposed orthopedic surgery at this time.  Plan: Formal orthopedic surgery consult for definitive surgical management.. NPO after MN per recommendation of orthopedic surgery. No pharmacologic anticoagulation leading up to this anticipated surgery.  Holding home Eliquis for now.  SCD's. Prn IV fentanyl. Anticipate postoperative PT consult.  Check 25-hydroxy vit D level to assess for any underlying pathological contribution towards the patient's fracture stemming from associated vitamin deficiency. Type and screen ordered.                #) Mechanical fall: The patient reports a mechanical fall off the second rung of a ladder which occurred at home earlier today without any associated loss of consciousness. Appears to be purely mechanical in nature, without clinical evidence at this time to suggest contributory dizziness, presyncope, syncope, or acute ischemic CVA.  While she did hit head as component of this fall, and is noted to be chronically anticoagulated on Eliquis, presenting CT head showed no evidence of acute intracranial process, including no evidence of  intracranial hemorrhage, and presentation does not appear to be associated any acute neurologic deficits.  While this fall appears to be purely mechanical in nature, will also follow-up for result of urinalysis to evaluate for any underlying infectious contribution.   Plan: Check urinalysis, as above.  CMP and CBC with differential in the morning. Fall precautions. Anticipate postoperative PT consult.                    #) Leukocytosis: Presenting CBC reflects mildly elevated white cell count of 17,900. Suspect that this is reactive in nature in the setting of presenting mechanical fall as well as associated acute hip fracture.  No evidence to suggest underlying infectious process at this  time, including CXR performed in the ED today, which showed no evidence of acute cardiopulmonary process, including no evidence of infiltrate to suggest pneumonia.  Will check urinalysis to further evaluate for any underlying infectious contribution.  Plan: Check urinalysis, as above.  Repeat CBC with diff in the morning.  Monitor strict I's and O's and daily weights.                      #) Persistent atrial fibrillation: Documented history of such. In setting of CHA2DS2-VASc score of  4, there is an indication for chronic anticoagulation for thromboembolic prophylaxis. Consistent with this, patient is chronically anticoagulated on Eliquis, which will be held for now given possibility of definitive surgical management of presenting acute left hip fracture, which did occur on the morning of 01/18/2024, pending formal orthopedic surgery consultation in the morning. Home AV nodal blocking regimen: Metoprolol succinate.  Most recent echocardiogram occurred in March 2020 was notable for LVEF 55 to 60%, indeterminate diastolic parameters, normal right ventricular systolic function, moderately dilated left atrium, mildly dilated right atrium, and no evidence of significant valvular pathology.  Presenting EKG shows rate controlled atrial fibrillation, without any evidence of acute ischemic changes.   Plan: monitor strict I's & O's and daily weights. CMP/CBC in AM. Check serum mag level. Continue home AV nodal blocking regimen.  Hold home Eliquis for now given plan for orthopedic surgical intervention, as above.  Monitor on symmetry.                    #) Essential Hypertension: documented h/o such, with outpatient antihypertensive regimen including amlodipine as well as metoprolol succinate.  SBP's in the ED today: Low 100s to 140s mmHg.   Plan: Close monitoring of subsequent BP via routine VS. monitor on telemetry.  Monitor strict I's and O's and daily weights.     DVT prophylaxis: SCD's   Code Status: Full code Family Communication: none Disposition Plan: Per Rounding Team Consults called: EDP discussed case/imaging with on-call orthopedic surgeon, Dr. Rosalee Kaufman of the Sports Medicine group, as further detailed above;  Admission status: Inpatient     I SPENT GREATER THAN 75  MINUTES IN CLINICAL CARE TIME/MEDICAL DECISION-MAKING IN COMPLETING THIS ADMISSION.      Chaney Born Danton Palmateer DO [facility]  From 7PM - 7AM   01/17/2024, 7:31 PM

## 2024-01-17 NOTE — ED Provider Notes (Signed)
 [place] EMERGENCY DEPARTMENT AT [facility] Provider Note   CSN: [id number] Arrival date & time: 01/17/24  1735     History  Chief Complaint  Patient presents with   Trauma   Fall   Hip Pain    Evelyn [name] is a 89 y.o. female.  The history is provided by the patient and the EMS personnel. No language interpreter was used.  Trauma Mechanism of injury: Fall Injury location: head/neck and pelvis Injury location detail: head and L hip Incident location: home Arrived directly from scene: yes   Fall:      Fall occurred: from a ladder      Height of fall: 16 inches      Impact surface: carpet      Point of impact: head and buttocks      Entrapped after fall: no  EMS/PTA data:      Bystander interventions: none      Ambulatory at scene: no      Blood loss: none      Responsiveness: alert      Oriented to: person, place, situation and time      Loss of consciousness: no      Airway interventions: none      IV access: established      Fluids administered: none      Cardiac interventions: none      Immobilization: C-collar  Current symptoms:      Associated symptoms:            Denies chest pain and loss of consciousness.   Relevant PMH:      Pharmacological risk factors:            Anticoagulation therapy.       The patient has been admitted to the hospital due to injury in the past year. Fall Pertinent negatives include no chest pain and no shortness of breath.  Hip Pain Pertinent negatives include no chest pain and no shortness of breath.       Home Medications Prior to Admission medications   Medication Sig Start Date End Date Taking? Authorizing Provider  amLODipine (NORVASC) 5 MG tablet Take 1 tablet (5 mg total) by mouth daily. 10/02/23   Donato Schultz, DO  ascorbic acid (VITAMIN C) 500 MG tablet Take 500 mg by mouth daily.    [provider]  calcium carbonate (OS-CAL) 600 MG TABS Take 600 mg by mouth daily.    [provider]  Cholecalciferol (VITAMIN D PO) Take 1 capsule by mouth daily. 2000 units    [provider]  ELIQUIS 2.5 MG TABS tablet TAKE 1 TABLET BY MOUTH TWICE DAILY 12/14/23   Iran Ouch, MD  glucosamine-chondroitin 500-400 MG tablet Take 1 tablet by mouth daily.     [provider]  Latanoprostene Bunod 0.024 % SOLN Apply 1 drop to eye at bedtime.    [provider]  metoprolol succinate (TOPROL-XL) 100 MG 24 hr tablet TAKE 1 TABLET(100 MG) BY MOUTH DAILY WITH OR IMMEDIATELY FOLLOWING A MEAL 10/18/23   Arida, Chelsea Aus, MD  Red Yeast Rice 600 MG TABS Take 1 tablet by mouth daily.    [provider]  timolol (TIMOPTIC) 0.5 % ophthalmic solution 1 drop every morning. 08/25/21   [provider]      Allergies    Vicodin [hydrocodone-acetaminophen], Pletaal [cilostazol], and Statins    Review of Systems   Review of Systems  Respiratory:  Negative for shortness of breath.   Cardiovascular:  Negative for chest pain.  Neurological:  Negative for dizziness and loss of consciousness.  All other systems reviewed and are negative.   Physical Exam Updated Vital Signs BP (!) 142/68   Pulse 81   Temp 98 F (36.7 C)   Resp 19   Ht 5\' 2"  (1.575 m)   Wt 54.4 kg   SpO2 98%   BMI 21.95 kg/m  Physical Exam Vitals and nursing note reviewed.  Constitutional:      Appearance: She is well-developed.  HENT:     Head: Normocephalic.     Comments: Tender left forehead, scalp,  no laceration     Right Ear: External ear normal.     Left Ear: External ear normal.     Nose: Nose normal.     Mouth/Throat:     Mouth: Mucous membranes are moist.  Cardiovascular:     Rate and Rhythm: Normal rate. Rhythm irregular.     Pulses: Normal pulses.  Pulmonary:     Effort: Pulmonary effort is normal. No respiratory distress.  Abdominal:     General: Abdomen is flat. There is no distension.     Tenderness: There is no abdominal tenderness.   Musculoskeletal:        General: Normal range of motion.     Cervical back: Normal range of motion and neck supple.     Comments: Shortened left hip, rotated   Skin:    General: Skin is warm.  Neurological:     General: No focal deficit present.     Mental Status: She is alert and oriented to person, place, and time.  Psychiatric:        Mood and Affect: Mood normal.     ED Results / Procedures / Treatments   Labs (all labs ordered are listed, but only abnormal results are displayed) Labs Reviewed  COMPREHENSIVE METABOLIC PANEL - Abnormal; Notable for the following components:      Result Value   Glucose, Bld 136 (*)    All other components within normal limits  CBC - Abnormal; Notable for the following components:   WBC 17.9 (*)    All other components within normal limits  PROTIME-INR - Abnormal; Notable for the following components:   Prothrombin Time 15.8 (*)    All other components within normal limits  I-STAT CHEM 8, ED - Abnormal; Notable for the following components:   Glucose, Bld 134 (*)    All other components within normal limits  ETHANOL  URINALYSIS, ROUTINE W REFLEX MICROSCOPIC  I-STAT CG4 LACTIC ACID, ED  SAMPLE TO BLOOD BANK    EKG None  Radiology CT HEAD WO CONTRAST Result Date: 01/17/2024 CLINICAL DATA:  Head trauma, moderate-severe; Polytrauma, blunt. Fall. EXAM: CT HEAD WITHOUT CONTRAST CT CERVICAL SPINE WITHOUT CONTRAST TECHNIQUE: Multidetector CT imaging of the head and cervical spine was performed following the standard protocol without intravenous contrast. Multiplanar CT image reconstructions of the cervical spine were also generated. RADIATION DOSE REDUCTION: This exam was performed according to the departmental dose-optimization program which includes automated exposure control, adjustment of the mA and/or kV according to patient size and/or use of iterative reconstruction technique. COMPARISON:  None Available. FINDINGS: CT HEAD FINDINGS Brain:  No acute hemorrhage. Cortical gray-white differentiation is preserved. Patchy hypoattenuation of the cerebral white matter, most consistent with mild chronic small-vessel disease. Prominence of the ventricles and sulci within normal limits for age. No extra-axial collection. Basilar cisterns are patent.  Vascular: No hyperdense vessel or unexpected calcification. Skull: No calvarial fracture or suspicious bone lesion. Skull base is unremarkable. Sinuses/Orbits: No acute finding. Other: None. CT CERVICAL SPINE FINDINGS Alignment: Degenerative reversal of the normal cervical lordosis. No traumatic malalignment. Skull base and vertebrae: No acute fracture. Normal craniocervical junction. No suspicious bone lesions. Multilevel severe disc height loss with degenerative endplate changes and sclerosis from C3-C7. Soft tissues and spinal canal: No prevertebral fluid or swelling. No visible canal hematoma. Disc levels: Multilevel cervical spondylosis, worst at C5-6, where there is at least mild spinal canal stenosis. Upper chest: No acute findings. Other: None. IMPRESSION: 1. No acute intracranial abnormality. 2. No acute cervical spine fracture or traumatic malalignment. 3. Multilevel cervical spondylosis, worst at C5-6, where there is at least mild spinal canal stenosis. Electronically Signed   By: Orvan Falconer M.D.   On: 01/17/2024 18:19   CT CERVICAL SPINE WO CONTRAST Result Date: 01/17/2024 CLINICAL DATA:  Head trauma, moderate-severe; Polytrauma, blunt. Fall. EXAM: CT HEAD WITHOUT CONTRAST CT CERVICAL SPINE WITHOUT CONTRAST TECHNIQUE: Multidetector CT imaging of the head and cervical spine was performed following the standard protocol without intravenous contrast. Multiplanar CT image reconstructions of the cervical spine were also generated. RADIATION DOSE REDUCTION: This exam was performed according to the departmental dose-optimization program which includes automated exposure control, adjustment of the mA  and/or kV according to patient size and/or use of iterative reconstruction technique. COMPARISON:  None Available. FINDINGS: CT HEAD FINDINGS Brain: No acute hemorrhage. Cortical gray-white differentiation is preserved. Patchy hypoattenuation of the cerebral white matter, most consistent with mild chronic small-vessel disease. Prominence of the ventricles and sulci within normal limits for age. No extra-axial collection. Basilar cisterns are patent. Vascular: No hyperdense vessel or unexpected calcification. Skull: No calvarial fracture or suspicious bone lesion. Skull base is unremarkable. Sinuses/Orbits: No acute finding. Other: None. CT CERVICAL SPINE FINDINGS Alignment: Degenerative reversal of the normal cervical lordosis. No traumatic malalignment. Skull base and vertebrae: No acute fracture. Normal craniocervical junction. No suspicious bone lesions. Multilevel severe disc height loss with degenerative endplate changes and sclerosis from C3-C7. Soft tissues and spinal canal: No prevertebral fluid or swelling. No visible canal hematoma. Disc levels: Multilevel cervical spondylosis, worst at C5-6, where there is at least mild spinal canal stenosis. Upper chest: No acute findings. Other: None. IMPRESSION: 1. No acute intracranial abnormality. 2. No acute cervical spine fracture or traumatic malalignment. 3. Multilevel cervical spondylosis, worst at C5-6, where there is at least mild spinal canal stenosis. Electronically Signed   By: Orvan Falconer M.D.   On: 01/17/2024 18:19   DG Chest Port 1 View Result Date: 01/17/2024 CLINICAL DATA:  Trauma EXAM: PORTABLE CHEST 1 VIEW COMPARISON:  05/04/2018 FINDINGS: Lung apices are incompletely included. There is cardiomegaly and aortic atherosclerosis. No consolidation, or pleural effusion. Chronic bronchitic changes IMPRESSION: No active disease allowing for incompletely included lung apices. Cardiomegaly. Electronically Signed   By: Jasmine Pang M.D.   On:  01/17/2024 18:09   DG Hip Unilat W or Wo Pelvis 2-3 Views Left Result Date: 01/17/2024 CLINICAL DATA:  Trauma left-sided hip pain EXAM: DG HIP (WITH OR WITHOUT PELVIS) 2-3V LEFT COMPARISON:  06/02/2010 FINDINGS: SI joints are non widened. Pubic symphysis and rami appear intact. Both femoral heads project in joint. Acute impacted basicervical/intertrochanteric fracture. Vascular calcifications IMPRESSION: Acute impacted left basicervical/intertrochanteric fracture. Electronically Signed   By: Jasmine Pang M.D.   On: 01/17/2024 18:08    Procedures Procedures    Medications Ordered in  ED Medications  ondansetron (ZOFRAN) injection 4 mg (has no administration in time range)    ED Course/ Medical Decision Making/ A&P                                 Medical Decision Making Patient brought to the emergency department by EMS.  Patient fell off of a stepladder striking her head and her left hip.  Patient called her daughter who called EMS.  Amount and/or Complexity of Data Reviewed Independent Historian: EMS    Details: EMS reports pt fell from a ladder  Labs: ordered. Decision-making details documented in ED Course.    Details: Labs ordered reviewed and interpreted INR is 15.8 Radiology: ordered and independent interpretation performed. Decision-making details documented in ED Course.    Details: Ct head and ct c spine no acute findings.   ECG/medicine tests: ordered and independent interpretation performed. Decision-making details documented in ED Course.    Details: Afib  at 22  Discussion of management or test interpretation with external provider(s): I spoke with Dr. Thad Ranger Orthopaedist on call who will see in am Dr. Marilynn Rail  Hospitalist consulted and will admit   Risk Prescription drug management. Decision regarding hospitalization.           Final Clinical Impression(s) / ED Diagnoses Final diagnoses:  Fall, initial encounter  Contusion of head, unspecified part of  head, initial encounter  Closed displaced intertrochanteric fracture of left femur, initial encounter [facility])    Rx / DC Orders ED Discharge Orders     None         Elson Areas, [place] 01/17/24 Eliberto Ivory, MD 01/18/24 1501

## 2024-01-17 NOTE — ED Notes (Signed)
 Patient transported to CT

## 2024-01-17 NOTE — Progress Notes (Signed)
 Transition of Care [facility]) - CAGE-AID Screening   Patient Details  Name: Evelyn [name] MRN: [id number] Date of Birth: Mar 16, 1930   Hewitt Shorts, RN Trauma Response Nurse Phone Number: [phone] 01/17/2024, 6:42 PM   CAGE-AID Screening:    Have You Ever Felt You Ought to Cut Down on Your Drinking or Drug Use?: No Have People Annoyed You By Critizing Your Drinking Or Drug Use?: No Have You Felt Bad Or Guilty About Your Drinking Or Drug Use?: No Have You Ever Had a Drink or Used Drugs First Thing In The Morning to Steady Your Nerves or to Get Rid of a Hangover?: No CAGE-AID Score: 0  Substance Abuse Education Offered: (S) No (Pt does have a mixed drink at night - [place] Iced Tea-- but declines any services)

## 2024-01-17 NOTE — ED Notes (Signed)
 Trauma Response Nurse Documentation   Evelyn [name] is a 89 y.o. female arriving to [facility] ED via [facility]  On Eliquis (apixaban) daily. Trauma was activated as a Level 2 by Charge RN based on the following trauma criteria Elderly patients > 65 with head trauma on anti-coagulation (excluding ASA).  Patient cleared for CT by Dr. Catalina Lunger PA. Pt transported to CT with trauma response nurse present to monitor. RN remained with the patient throughout their absence from the department for clinical observation.   GCS 15.   History   Past Medical History:  Diagnosis Date   Atrial fibrillation (HCC)    Hyperlipidemia    Hypertension    Osteoporosis    Post-menopausal    HRT     Past Surgical History:  Procedure Laterality Date   ABDOMINAL HYSTERECTOMY     BREAST LUMPECTOMY  11/28/1973   EYE SURGERY     Cataract removal       Initial Focused Assessment (If applicable, or please see trauma documentation): Airway - clear Breathing - unlabored Circulation- strong peripheral pulses. Strong pedal pulse - left foot--  GCS -- 15  CT's Completed:   CT Head and CT C-Spine   Interventions:  Labs Xrays CT scan Pain control  Plan for disposition:  Admission to floor   Consults completed:  Orthopaedic Surgeon at 1838 -- Dr. Thad Ranger- paged per Langston Masker, PA.  Event Summary:  Pt was standing on a step ladder , rearranging her mantel- and fell onto left hip. Left leg shortened and rotated- good pedal pulse and cap refill.  EMS started 1 IV-18 g in right FA.  Pt also hit her head, no bruising noted.   Neighbor is with pt- she lives alone- daughters x 2 live in [place]. She has contacted them.   Pt also drinks 1 [place]ced Tea at night- denies any withdrawal symptoms if she does not have one.      Lesle Chris Regis Wiland  Trauma Response RN  Please call TRN at [phone] for further assistance.

## 2024-01-17 NOTE — Anesthesia Preprocedure Evaluation (Signed)
 Anesthesia Evaluation    Reviewed: Allergy & Precautions, Patient's Chart, lab work & pertinent test results, Unable to perform ROS - Chart review only  Airway Mallampati: II  TM Distance: >3 FB Neck ROM: Full    Dental no notable dental hx.    Pulmonary former smoker   Pulmonary exam normal breath sounds clear to auscultation       Cardiovascular hypertension, Pt. on medications + Peripheral Vascular Disease  Normal cardiovascular exam+ Valvular Problems/Murmurs  Rhythm:Regular Rate:Normal  Echo 01/2019  1. The left ventricle has normal systolic function, with an ejection fraction of 55-60%. The cavity size was normal. Left ventricular diastolic function could not be evaluated secondary to atrial fibrillation. Elevated left ventricular end-diastolic pressure.   2. The right ventricle has normal systolic function. The cavity was normal. There is no increase in right ventricular wall thickness.   3. Left atrial size was moderately dilated.   4. Right atrial size was mildly dilated.   5. The aortic valve is tricuspid Mild thickening of the aortic valve Mild calcification of the aortic valve.     Neuro/Psych negative neurological ROS     GI/Hepatic negative GI ROS, Neg liver ROS,,,  Endo/Other  negative endocrine ROS    Renal/GU negative Renal ROS     Musculoskeletal  (+) Arthritis ,    Abdominal   Peds  Hematology negative hematology ROS (+)   Anesthesia Other Findings   Reproductive/Obstetrics                             Anesthesia Physical Anesthesia Plan  ASA: 3  Anesthesia Plan: General   Post-op Pain Management: Tylenol PO (pre-op)*   Induction: Intravenous  PONV Risk Score and Plan: 4 or greater and Ondansetron, Treatment may vary due to age or medical condition and Dexamethasone  Airway Management Planned: Oral ETT  Additional Equipment:   Intra-op Plan:   Post-operative  Plan: Extubation in OR  Informed Consent:   Plan Discussed with:   Anesthesia Plan Comments:        Anesthesia Quick Evaluation

## 2024-01-17 NOTE — ED Notes (Signed)
 X-ray at bedside

## 2024-01-17 NOTE — Progress Notes (Signed)
 Orthopedic Tech Progress Note Patient Details:  Evelyn [name] Jan 28, 1930 [id number]  Level 2 trauma, not needed at this second   Patient ID: Evelyn [name], female   DOB: 07/29/1930, 89 y.o.   MRN: [id number]  [name] 01/17/2024, 5:44 PM

## 2024-01-17 NOTE — Consult Note (Signed)
 Orthopedic Consultation Note  Current Hospital Day : Hospital Day: 1  Reason For Consult: Left hip fracture  History of Present Illness:  [name] is a 89 y.o. female who presents to the emergency department this evening after falling while at home.  She is found to have a left hip fracture for which orthopedic surgery was consulted.  She is here this evening with her friend and neighbor.  She fell off of a stepstool while she was at home.  She lives by herself and ambulates without any assistive devices  She does take Eliquis, last taken today.  Past Medical History:  Diagnosis Date   Atrial fibrillation (HCC)    Hyperlipidemia    Hypertension    Osteoporosis    Post-menopausal    HRT    Past Surgical History:  Procedure Laterality Date   ABDOMINAL HYSTERECTOMY     BREAST LUMPECTOMY  11/28/1973   EYE SURGERY     Cataract removal    Prior to Admission medications   Medication Sig Start Date End Date Taking? Authorizing Provider  amLODipine (NORVASC) 5 MG tablet Take 1 tablet (5 mg total) by mouth daily. 10/02/23   Donato Schultz, DO  ascorbic acid (VITAMIN C) 500 MG tablet Take 500 mg by mouth daily.    [provider]  calcium carbonate (OS-CAL) 600 MG TABS Take 600 mg by mouth daily.    [provider]  Cholecalciferol (VITAMIN D PO) Take 1 capsule by mouth daily. 2000 units    [provider]  ELIQUIS 2.5 MG TABS tablet TAKE 1 TABLET BY MOUTH TWICE DAILY 12/14/23   Iran Ouch, MD  glucosamine-chondroitin 500-400 MG tablet Take 1 tablet by mouth daily.     [provider]  Latanoprostene Bunod 0.024 % SOLN Apply 1 drop to eye at bedtime.    [provider]  metoprolol succinate (TOPROL-XL) 100 MG 24 hr tablet TAKE 1 TABLET(100 MG) BY MOUTH DAILY WITH OR IMMEDIATELY FOLLOWING A MEAL 10/18/23   Arida, Chelsea Aus, MD  Red Yeast Rice 600 MG TABS Take 1 tablet by mouth daily.    [provider]  timolol  (TIMOPTIC) 0.5 % ophthalmic solution 1 drop every morning. 08/25/21   [provider]    Physical Examination Right Lower Extremity: Skin is intact, ROM deferred + ankle dorsiflexion/plantarflexion/EHL SILT SP/DP/T Foot wwp   No pain with gross movement in any other limbs  Imaging: X-rays of the left hip reviewed interpreted demonstrating standard obliquity intertrochanteric hip fracture  Assessment:   Evelyn [name] is a 89 y.o. female with a left intertrochanteric hip fracture after mechanical fall.  I discussed with her, her neighbor and friend as well as with her daughter by phone treatment options including nonoperative management and specifically cephalomedullary nail of the left hip.  We discussed associated risks and benefits.  We also discussed her Eliquis usage and bleeding risk.  Ultimately they were in favor of proceeding with surgery when appropriately safe and as soon as that would be.  Plan:   Nonweightbearing, bedrest Plan for OR 2/20 DVT ppx: SCDs, hold Eliquis NPO at midnight Follow up full-length femur films Appreciate medicine pre-op eval/optimization

## 2024-01-18 ENCOUNTER — Inpatient Hospital Stay (HOSPITAL_COMMUNITY): Payer: Medicare Other

## 2024-01-18 ENCOUNTER — Inpatient Hospital Stay (HOSPITAL_COMMUNITY): Payer: Medicare Other | Admitting: Anesthesiology

## 2024-01-18 ENCOUNTER — Encounter (HOSPITAL_COMMUNITY)
Admission: EM | Disposition: A | Discharge status: Skilled Nursing Facility | Payer: Self-pay | Source: Home / Self Care | Attending: Internal Medicine

## 2024-01-18 ENCOUNTER — Encounter (HOSPITAL_COMMUNITY): Payer: Self-pay | Admitting: Internal Medicine

## 2024-01-18 DIAGNOSIS — I10 Essential (primary) hypertension: Secondary | ICD-10-CM | POA: Diagnosis not present

## 2024-01-18 DIAGNOSIS — S72142A Displaced intertrochanteric fracture of left femur, initial encounter for closed fracture: Secondary | ICD-10-CM | POA: Diagnosis not present

## 2024-01-18 DIAGNOSIS — S72002A Fracture of unspecified part of neck of left femur, initial encounter for closed fracture: Secondary | ICD-10-CM | POA: Diagnosis not present

## 2024-01-18 DIAGNOSIS — S0093XA Contusion of unspecified part of head, initial encounter: Secondary | ICD-10-CM

## 2024-01-18 DIAGNOSIS — I4819 Other persistent atrial fibrillation: Secondary | ICD-10-CM | POA: Diagnosis not present

## 2024-01-18 DIAGNOSIS — E785 Hyperlipidemia, unspecified: Secondary | ICD-10-CM | POA: Diagnosis not present

## 2024-01-18 DIAGNOSIS — W19XXXA Unspecified fall, initial encounter: Secondary | ICD-10-CM | POA: Diagnosis not present

## 2024-01-18 HISTORY — PX: FEMUR IM NAIL: SHX1597

## 2024-01-18 LAB — CBC WITH DIFFERENTIAL/PLATELET
Abs Immature Granulocytes: 0.03 10*3/uL (ref 0.00–0.07)
Basophils Absolute: 0 10*3/uL (ref 0.0–0.1)
Basophils Relative: 0 %
Eosinophils Absolute: 0 10*3/uL (ref 0.0–0.5)
Eosinophils Relative: 0 %
HCT: 36.4 % (ref 36.0–46.0)
Hemoglobin: 12.1 g/dL (ref 12.0–15.0)
Immature Granulocytes: 0 %
Lymphocytes Relative: 14 %
Lymphs Abs: 1.4 10*3/uL (ref 0.7–4.0)
MCH: 31.2 pg (ref 26.0–34.0)
MCHC: 33.2 g/dL (ref 30.0–36.0)
MCV: 93.8 fL (ref 80.0–100.0)
Monocytes Absolute: 1.4 10*3/uL — ABNORMAL HIGH (ref 0.1–1.0)
Monocytes Relative: 14 %
Neutro Abs: 7 10*3/uL (ref 1.7–7.7)
Neutrophils Relative %: 72 %
Platelets: 180 10*3/uL (ref 150–400)
RBC: 3.88 MIL/uL (ref 3.87–5.11)
RDW: 13.4 % (ref 11.5–15.5)
WBC: 9.8 10*3/uL (ref 4.0–10.5)
nRBC: 0 % (ref 0.0–0.2)

## 2024-01-18 LAB — COMPREHENSIVE METABOLIC PANEL
ALT: 17 U/L (ref 0–44)
AST: 25 U/L (ref 15–41)
Albumin: 3.4 g/dL — ABNORMAL LOW (ref 3.5–5.0)
Alkaline Phosphatase: 45 U/L (ref 38–126)
Anion gap: 14 (ref 5–15)
BUN: 13 mg/dL (ref 8–23)
CO2: 26 mmol/L (ref 22–32)
Calcium: 9.7 mg/dL (ref 8.9–10.3)
Chloride: 99 mmol/L (ref 98–111)
Creatinine, Ser: 0.73 mg/dL (ref 0.44–1.00)
GFR, Estimated: >60 mL/min (ref >60)
Glucose, Bld: 138 mg/dL — ABNORMAL HIGH (ref 70–99)
Potassium: 4.1 mmol/L (ref 3.5–5.1)
Sodium: 139 mmol/L (ref 135–145)
Total Bilirubin: 1.2 mg/dL (ref 0.0–1.2)
Total Protein: 7.1 g/dL (ref 6.5–8.1)

## 2024-01-18 LAB — VITAMIN D 25 HYDROXY (VIT D DEFICIENCY, FRACTURES): Vit D, 25-Hydroxy: 89.83 ng/mL (ref 30–100)

## 2024-01-18 LAB — ABO/RH: ABO/RH(D): A POS

## 2024-01-18 LAB — MAGNESIUM
Magnesium: 2 mg/dL (ref 1.7–2.4)
Magnesium: 2.1 mg/dL (ref 1.7–2.4)

## 2024-01-18 SURGERY — INSERTION, INTRAMEDULLARY ROD, FEMUR
Anesthesia: General | Site: Hip | Laterality: Left

## 2024-01-18 MED ORDER — CEFAZOLIN SODIUM-DEXTROSE 2-4 GM/100ML-% IV SOLN
INTRAVENOUS | Status: AC
  Filled 2024-01-18: qty 100

## 2024-01-18 MED ORDER — TRANEXAMIC ACID-NACL 1000-0.7 MG/100ML-% IV SOLN
1000.0000 mg | INTRAVENOUS | Status: AC
  Administered 2024-01-18: 1000 mg via INTRAVENOUS

## 2024-01-18 MED ORDER — ACETAMINOPHEN 500 MG PO TABS
ORAL_TABLET | ORAL | Status: AC
  Administered 2024-01-18: 1000 mg via ORAL
  Filled 2024-01-18: qty 2

## 2024-01-18 MED ORDER — ROCURONIUM BROMIDE 100 MG/10ML IV SOLN
INTRAVENOUS | Status: DC | PRN
  Administered 2024-01-18: 40 mg via INTRAVENOUS

## 2024-01-18 MED ORDER — FENTANYL CITRATE (PF) 100 MCG/2ML IJ SOLN
25.0000 ug | INTRAMUSCULAR | Status: DC | PRN
  Administered 2024-01-18: 25 ug via INTRAVENOUS

## 2024-01-18 MED ORDER — ORAL CARE MOUTH RINSE: 15.0000 mL | Freq: Once | OROMUCOSAL | Status: DC

## 2024-01-18 MED ORDER — PROPOFOL 10 MG/ML IV BOLUS
INTRAVENOUS | Status: DC | PRN
  Administered 2024-01-18: 60 mg via INTRAVENOUS
  Administered 2024-01-18: 40 mg via INTRAVENOUS

## 2024-01-18 MED ORDER — DROPERIDOL 2.5 MG/ML IJ SOLN: 0.6250 mg | Freq: Once | INTRAMUSCULAR | Status: DC | PRN

## 2024-01-18 MED ORDER — ORAL CARE MOUTH RINSE: 15.0000 mL | OROMUCOSAL | Status: DC | PRN

## 2024-01-18 MED ORDER — ACETAMINOPHEN 500 MG PO TABS: 1000.0000 mg | ORAL_TABLET | Freq: Once | ORAL | Status: AC

## 2024-01-18 MED ORDER — PHENYLEPHRINE 80 MCG/ML (10ML) SYRINGE FOR IV PUSH (FOR BLOOD PRESSURE SUPPORT)
PREFILLED_SYRINGE | INTRAVENOUS | Status: DC | PRN
  Administered 2024-01-18 (×3): 80 ug via INTRAVENOUS

## 2024-01-18 MED ORDER — TRANEXAMIC ACID-NACL 1000-0.7 MG/100ML-% IV SOLN
INTRAVENOUS | Status: AC
  Filled 2024-01-18: qty 100

## 2024-01-18 MED ORDER — DEXAMETHASONE SODIUM PHOSPHATE 10 MG/ML IJ SOLN
INTRAMUSCULAR | Status: DC | PRN
  Administered 2024-01-18: 5 mg via INTRAVENOUS

## 2024-01-18 MED ORDER — SUGAMMADEX SODIUM 200 MG/2ML IV SOLN
INTRAVENOUS | Status: DC | PRN
Start: 2024-01-18 — End: 2024-01-18
  Administered 2024-01-18 (×2): 100 mg via INTRAVENOUS

## 2024-01-18 MED ORDER — FENTANYL CITRATE (PF) 250 MCG/5ML IJ SOLN
INTRAMUSCULAR | Status: AC
Start: 2024-01-18 — End: ?
  Filled 2024-01-18: qty 5

## 2024-01-18 MED ORDER — CEFAZOLIN SODIUM-DEXTROSE 2-4 GM/100ML-% IV SOLN
2.0000 g | INTRAVENOUS | Status: AC
  Administered 2024-01-18: 2 g via INTRAVENOUS

## 2024-01-18 MED ORDER — CEFAZOLIN SODIUM-DEXTROSE 1-4 GM/50ML-% IV SOLN
1.0000 g | Freq: Three times a day (TID) | INTRAVENOUS | Status: AC
  Administered 2024-01-18 – 2024-01-19 (×2): 1 g via INTRAVENOUS
  Filled 2024-01-18 (×2): qty 50

## 2024-01-18 MED ORDER — 0.9 % SODIUM CHLORIDE (POUR BTL) OPTIME
TOPICAL | Status: DC | PRN
  Administered 2024-01-18: 1000 mL

## 2024-01-18 MED ORDER — PHENYLEPHRINE HCL-NACL 20-0.9 MG/250ML-% IV SOLN
INTRAVENOUS | Status: DC | PRN
  Administered 2024-01-18: 30 ug/min via INTRAVENOUS

## 2024-01-18 MED ORDER — LACTATED RINGERS IV SOLN: INTRAVENOUS | Status: AC

## 2024-01-18 MED ORDER — LACTATED RINGERS IV SOLN: INTRAVENOUS | Status: DC | PRN

## 2024-01-18 MED ORDER — FENTANYL CITRATE (PF) 250 MCG/5ML IJ SOLN
INTRAMUSCULAR | Status: DC | PRN
  Administered 2024-01-18: 25 ug via INTRAVENOUS

## 2024-01-18 MED ORDER — FENTANYL CITRATE (PF) 100 MCG/2ML IJ SOLN
INTRAMUSCULAR | Status: AC
  Filled 2024-01-18: qty 2

## 2024-01-18 MED ORDER — CHLORHEXIDINE GLUCONATE 0.12 % MT SOLN
OROMUCOSAL | Status: AC
  Filled 2024-01-18: qty 15

## 2024-01-18 MED ORDER — METHOCARBAMOL 1000 MG/10ML IJ SOLN
500.0000 mg | Freq: Three times a day (TID) | INTRAMUSCULAR | Status: DC | PRN
  Administered 2024-01-18: 500 mg via INTRAVENOUS
  Filled 2024-01-18: qty 10

## 2024-01-18 MED ORDER — CHLORHEXIDINE GLUCONATE 0.12 % MT SOLN: 15.0000 mL | Freq: Once | OROMUCOSAL | Status: DC

## 2024-01-18 MED ORDER — LACTATED RINGERS IV SOLN: INTRAVENOUS | Status: DC

## 2024-01-18 SURGICAL SUPPLY — 57 items
BAG COUNTER SPONGE SURGICOUNT (BAG) ×1 IMPLANT
BIT DRILL CALIBRATED 4.2 (BIT) IMPLANT
BIT DRILL CANN 16 HIP (BIT) IMPLANT
BIT DRILL CANN STP 6/9 HIP (BIT) IMPLANT
BIT DRILL TAPERED 10 (BIT) IMPLANT
BLADE SURG 10 STRL SS (BLADE) ×2 IMPLANT
BNDG COHESIVE 4X5 TAN STRL LF (GAUZE/BANDAGES/DRESSINGS) ×1 IMPLANT
BNDG COHESIVE 6X5 TAN ST LF (GAUZE/BANDAGES/DRESSINGS) IMPLANT
BNDG ELASTIC 4X5.8 VLCR STR LF (GAUZE/BANDAGES/DRESSINGS) ×1 IMPLANT
BNDG ELASTIC 6INX 5YD STR LF (GAUZE/BANDAGES/DRESSINGS) ×1 IMPLANT
BRUSH SCRUB EZ PLAIN DRY (MISCELLANEOUS) ×2 IMPLANT
CHLORAPREP W/TINT 26 (MISCELLANEOUS) ×1 IMPLANT
COVER SURGICAL LIGHT HANDLE (MISCELLANEOUS) ×1 IMPLANT
DRAPE C-ARM 35X43 STRL (DRAPES) ×1 IMPLANT
DRAPE C-ARMOR (DRAPES) ×1 IMPLANT
DRAPE HALF SHEET 40X57 (DRAPES) ×2 IMPLANT
DRAPE IMP U-DRAPE 54X76 (DRAPES) ×2 IMPLANT
DRAPE INCISE IOBAN 66X45 STRL (DRAPES) ×1 IMPLANT
DRAPE SURG 17X23 STRL (DRAPES) ×1 IMPLANT
DRAPE SURG ORHT 6 SPLT 77X108 (DRAPES) ×2 IMPLANT
DRAPE U-SHAPE 47X51 STRL (DRAPES) ×1 IMPLANT
DRESSING MEPILEX FLEX 4X4 (GAUZE/BANDAGES/DRESSINGS) ×3 IMPLANT
DRILL BIT CALIBRATED 4.2 (BIT) ×1 IMPLANT
DRSG AQUACEL AG 3.5X4 (GAUZE/BANDAGES/DRESSINGS) IMPLANT
DRSG MEPILEX FLEX 4X4 (GAUZE/BANDAGES/DRESSINGS) ×3 IMPLANT
DRSG MEPILEX POST OP 4X8 (GAUZE/BANDAGES/DRESSINGS) ×1 IMPLANT
ELECT REM PT RETURN 9FT ADLT (ELECTROSURGICAL) ×1 IMPLANT
ELECTRODE REM PT RTRN 9FT ADLT (ELECTROSURGICAL) ×1 IMPLANT
GLOVE BIO SURGEON STRL SZ 6.5 (GLOVE) ×3 IMPLANT
GLOVE BIO SURGEON STRL SZ7.5 (GLOVE) ×3 IMPLANT
GLOVE BIOGEL PI IND STRL 6.5 (GLOVE) ×1 IMPLANT
GLOVE BIOGEL PI IND STRL 7.5 (GLOVE) ×1 IMPLANT
GOWN STRL REUS W/ TWL LRG LVL3 (GOWN DISPOSABLE) ×3 IMPLANT
GOWN STRL REUS W/ TWL XL LVL3 (GOWN DISPOSABLE) ×1 IMPLANT
GUIDEWIRE 3.2X400 (WIRE) IMPLANT
IMPL DEG TI CANN 11MM/130 (Orthopedic Implant) IMPLANT
IMPLANT DEG TI CANN 11MM/130 (Orthopedic Implant) ×1 IMPLANT
KIT BASIN OR (CUSTOM PROCEDURE TRAY) ×1 IMPLANT
KIT TURNOVER KIT B (KITS) ×1 IMPLANT
MANIFOLD NEPTUNE II (INSTRUMENTS) ×1 IMPLANT
NS IRRIG 1000ML POUR BTL (IV SOLUTION) ×1 IMPLANT
PACK GENERAL/GYN (CUSTOM PROCEDURE TRAY) ×1 IMPLANT
PAD ARMBOARD 7.5X6 YLW CONV (MISCELLANEOUS) ×2 IMPLANT
SCREW LAG TFNA 90 HIP (Screw) IMPLANT
SCREW LOCK IM TI 5X30 STRL (Screw) IMPLANT
SCREW LOCK IM TI 5X32 (Screw) IMPLANT
STAPLER VISISTAT 35W (STAPLE) ×1 IMPLANT
STOCKINETTE IMPERVIOUS LG (DRAPES) ×1 IMPLANT
STRIP CLOSURE SKIN 1/2X4 (GAUZE/BANDAGES/DRESSINGS) IMPLANT
SUT ETHILON 3 0 PS 1 (SUTURE) ×1 IMPLANT
SUT MNCRL AB 3-0 PS2 18 (SUTURE) ×1 IMPLANT
SUT VIC AB 0 CT1 27XBRD ANBCTR (SUTURE) IMPLANT
SUT VIC AB 2-0 CT1 TAPERPNT 27 (SUTURE) ×2 IMPLANT
TOWEL GREEN STERILE (TOWEL DISPOSABLE) ×2 IMPLANT
TOWEL GREEN STERILE FF (TOWEL DISPOSABLE) ×1 IMPLANT
UNDERPAD 30X36 HEAVY ABSORB (UNDERPADS AND DIAPERS) ×1 IMPLANT
WATER STERILE IRR 1000ML POUR (IV SOLUTION) ×1 IMPLANT

## 2024-01-18 NOTE — Transfer of Care (Signed)
 Immediate Anesthesia Transfer of Care Note  Patient: Evelyn [name]  Procedure(s) Performed: LEFT FEMUR CEPHALOMEDULLARY NAIL (Left: Hip)  Patient Location: PACU  Anesthesia Type:General  Level of Consciousness: awake  Airway & Oxygen Therapy: Patient Spontanous Breathing  Post-op Assessment: Report given to RN  Post vital signs: stable  Last Vitals:  Vitals Value Taken Time  BP 109/80 01/18/24 1332  Temp    Pulse 70 01/18/24 1335  Resp 28 01/18/24 1335  SpO2 92 % 01/18/24 1335  Vitals shown include unfiled device data.  Last Pain:  Vitals:   01/18/24 1147  TempSrc: Oral  PainSc: 3          Complications: No notable events documented.

## 2024-01-18 NOTE — Brief Op Note (Signed)
 01/17/2024 - 01/18/2024  1:27 PM  PATIENT:  Evelyn [name]  89 y.o. female  PRE-OPERATIVE DIAGNOSIS:  Left hip fracture  POST-OPERATIVE DIAGNOSIS:  Left hip fracture  PROCEDURE:  Procedure(s) with comments: LEFT FEMUR CEPHALOMEDULLARY NAIL (Left) - Hana table, C-arm, Synthes  SURGEON:  Surgeons and Role:    Rosalee Kaufman, MD - Primary  PHYSICIAN ASSISTANT:   ASSISTANTS: none   ANESTHESIA:   general  EBL:  10 mL   BLOOD ADMINISTERED:none  DRAINS: none   LOCAL MEDICATIONS USED:  NONE  SPECIMEN:  No Specimen  DISPOSITION OF SPECIMEN:  N/A  COUNTS:  YES  TOURNIQUET:  * No tourniquets in log *  DICTATION: .Dragon Dictation  PLAN OF CARE: Admit to inpatient   PATIENT DISPOSITION:  PACU - hemodynamically stable.   Delay start of Pharmacological VTE agent (>24hrs) due to surgical blood loss or risk of bleeding: no

## 2024-01-18 NOTE — Op Note (Signed)
 Operative Note  [facility]  Surgery Date: 01/17/2024 - 01/18/2024 Surgeon: Rosalee Kaufman, MD Assistant(s): None  Preop Diagnosis(es):  Left hip intertrochanteric femur fracture Osteoporosis with fragility fracture  Postop Diagnosis(es):  Left hip intertrochanteric femur fracture Osteoporosis with fragility fracture  Operative Procedure(s): Left femur cephalomedullary nail for internal fixation of intertrochanteric hip fracture   Anesthesia: The patient had administration of general anesthesia. Further details can be found in the anesthesia record.  Estimated Blood Loss: 50 mL  Complications:  None noted intraoperatively  Drains: None  Implants:  Synthes size 11 short nail with 90mm lag screw. Full detailed list below.  Indications:  Ikhlas Albo is a 89 y.o. year old female who who presented to the emergency department yesterday after falling while at home.  She was found to have an intertrochanteric left hip fracture.  I discussed treatment options with both her and her, her friend and her daughter including internal fixation versus nonoperative management.  We discussed risk of internal fixation including nonunion, malunion, hardware failure, infection, neurovascular injury, need for revision surgery, hip pain and stiffness among others..  After thorough discussion of the risks and benefits of surgical management and alternative nonoperative treatment options, they elected to proceed with surgical treatment. Risks and complications were discussed and understood including, but not limited to, bleeding, infection, stiffness, numbness, damage to surrounding structures (including blood vessels and nerves), failure of the procedure, need for secondary or revision procedures, failure of healing, incomplete functional recovery, and worsening or chronic pain. Additional risks pertinent to the surgery and anesthesia also include pulmonary compromise, blood clots/pulmonary embolism,  cardiac complications, and death. No guarantees were stated or implied. All questions were answered to the best of my ability and the patient verbalized understanding.    Description of Procedure:  The patient was identified in the holding area, taken to the operating room and underwent successful induction of anesthesia. They were then placed on the operating table in a supine position on the Hana table, with all bony prominences well padded. Preprocedure antibiotics were administered. A time out was performed and all parties were in agreement with the patient identification, surgical site and planned procedure. The extremity was then prepped and draped in usual sterile fashion. A second time out was performed prior to incision, once again confirming the patient, site, procedure and expectations of surgery.  C-arm was brought in and the fracture was reduced with traction, and internal rotation.   Incision was made just proximal to the tip of the greater trochanter. Dissection was taken down through skin and subcutaneous tissue with scalpel. The guide pin was placed through the incision down to the tip of the greater trochanter and once an adequate start site was obtained on biplanar fluoroscopy, the guide pin was advanced into the proximal femur down to the level of the lesser trochanter.  Next, the opening reamer was used to ream out the proximal femur over the guide pin, while using a soft tissue guide.  The size 11 nail was opened and assembled on the back table. The nail was then inserted into the femoral canal and taken down until it was fully seated. Biplanar fluoroscopy confirmed intramedullary location.  A lateral incision was then made for the insertion of the lateral guide, through skin, subcutaneous tissue and iliotibial band. Through the lateral guide, a guide pin was advanced into the neck and head of the femur. It was confirmed to be in adequate position, center center on the AP  and  lateral views of the hip.  The measuring guide was used to determine that a 90 mm length screw would be needed.  Next, the lag screw drill was used through the lateral guide, reconfirming our screw length based on the drill. The lag screw was then inserted on hand. Proximally, the set screw was tightened to lock rotation.  A distal interlocking screw was placed after bicortical drilling through the lateral guide.  The proximal guide was then removed. Final x-rays were obtained showing adequate fracture reduction and stabilization with restoration of the valgus neck angle,  as well as nail and screw length and placement. The wounds were then thoroughly irrigated and closed in layered fashion with 0 Vicryl, 2-0 monocryl and 3-0 monocryl on the skin.     All counts at the end of the case were correct.  Post-Operative Condition: The patient was transferred to the PACU in stable condition.  Post-Operative Plan: They will be weightbearing as tolerated using a walker.  They will follow our hip fracture rehabilitation protocol. We will see them back in 10 days for a wound check and for further review of surgical findings.  Implant Record:  Implant Name Type Inv. Item Serial No. Manufacturer Lot No. LRB No. Used Action  IMPLANT DEG TI CANN 11MM/130 - [id number] Orthopedic Implant IMPLANT DEG TI CANN 11MM/130  [employer]  Left 1 Implanted  SCREW LOCK IM TI 5X30 STRL - [id number] Screw SCREW LOCK IM TI 5X30 STRL  [employer]  Left 1 Implanted  SCREW LAG TFNA 90 HIP - [id number] Screw SCREW LAG TFNA 90 HIP  [employer]  Left 1 Implanted

## 2024-01-18 NOTE — ED Notes (Signed)
 Attending Howerter, Chaney Born, DO notified that patient's oxygen saturation was between 89% and 90% so this nurse placed the patient on 2 liters of oxygen via [place] with the oxygen saturation now at 98%.

## 2024-01-18 NOTE — Progress Notes (Signed)
  Progress Note   Patient: Evelyn [name] YSA:[id number] DOB: February 15, 1930 DOA: 01/17/2024     1 DOS: the patient was seen and examined on 01/18/2024   Brief hospital course: 89 y.o. female with medical history significant for persistent atrial fibrillation chronically anticoagulated on Eliquis, essential hypertension, who is admitted to [facility] on 01/17/2024 with acute left intertrochanteric hip fracture after presenting from home to [facility] ED complaining of fall. Pt was found to have acute L hip fracture. Orthopedic Surgery was consulted   Assessment and Plan: #) Acute left intertrochanteric hip fracture:  -confirmed via presenting plain films and stemming from mechanical fall without associated loss of consciousness  -Orthopedic Surgery consulted. Pt now s/p L hip intertrochanteric femur fracture on 2/20 -f/u with PT/PT recs post-operative   #) Mechanical fall:  -Pt describes mechanical fall off step stool/ladder -f/u with PT/OT recs  #) Leukocytosis:  -Suspect reactive secondary to acute fracture -No evidence of acute infection at this time  #) Persistent atrial fibrillation:  -CHA2DS2-VASc score of  4 -Currently rate controlled -would continue eliquis when OK with Orthopedic Surgery    #) Essential Hypertension:  -BP stable and controlled -Currently continued on metoprolol     Subjective: Seen prior to surgery. Complained of continued hip pain this AM  Physical Exam: Vitals:   01/18/24 1400 01/18/24 1415 01/18/24 1430 01/18/24 1449  BP: 114/66 109/60 (!) 124/57 (!) 115/55  Pulse: 69 72 70 75  Resp: 18 (!) 21 17   Temp:   98.1 F (36.7 C) 98.5 F (36.9 C)  TempSrc:    Oral  SpO2: 90% 96% 98% 98%  Weight:      Height:       General exam: Awake, laying in bed, in nad Respiratory system: Normal respiratory effort, no wheezing Cardiovascular system: regular rate, s1, s2 Gastrointestinal system: Soft, nondistended, positive BS Central nervous system: CN2-12  grossly intact, strength intact Extremities: Perfused, no clubbing Skin: Normal skin turgor, no notable skin lesions seen Psychiatry: Mood normal // no visual hallucinations   Data Reviewed:  Labs reviewed: Na 139, K 4.1, Cr 0.73, WBC 9.8, Hgb 12.1  Family Communication: Pt in room, family not at bedside  Disposition: Status is: Inpatient Remains inpatient appropriate because: severity of illness  Planned Discharge Destination: Home     Author: Rickey Barbara, MD 01/18/2024 6:05 PM  For on call review [email or web address].

## 2024-01-18 NOTE — ED Notes (Signed)
 Due to patient being NPO, this nurse brought up that the patient had an order for a acetaminophen suppository for her pain. Patient refused.

## 2024-01-18 NOTE — ED Notes (Signed)
 Attending Howerter, Chaney Born, DO notified about patient's lack of urine output and that patient was placed on a pure wick to decrease need to turn patient to place her on a bed pan. Attending endorsed pure wick and stated will put in appropriate orders.

## 2024-01-18 NOTE — Anesthesia Procedure Notes (Signed)
 Procedure Name: Intubation Date/Time: 01/18/2024 12:14 PM  Performed by: Halina Andreas, CRNAPre-anesthesia Checklist: Patient identified, Emergency Drugs available, Suction available, Patient being monitored and Timeout performed Patient Re-evaluated:Patient Re-evaluated prior to induction Oxygen Delivery Method: Circle system utilized Preoxygenation: Pre-oxygenation with 100% oxygen Induction Type: IV induction Ventilation: Mask ventilation without difficulty Laryngoscope Size: Mac and 3 Grade View: Grade I Tube type: Oral Tube size: 7.0 mm Number of attempts: 1 Airway Equipment and Method: Stylet Placement Confirmation: ETT inserted through vocal cords under direct vision, positive ETCO2 and breath sounds checked- equal and bilateral Secured at: 22 cm Dental Injury: Teeth and Oropharynx as per pre-operative assessment

## 2024-01-18 NOTE — Discharge Instructions (Signed)
 Orthopaedic Surgery Discharge Instructions  Surgery: Left hip intramedullary nail for intertrochanteric hip fracture  Weight bearing: Weightbearing as tolerated using a walker  Dressings/Incisions: Keep clean and dry at all times. If dressings become wet or soiled, they should be changed with regular dry gauze or a clean bandage. It is OK to shower, gently pat incision or dressing dry afterwards. Leave steri-strips in place until they fall off on their own. DO NOT put anything on your incisions (cream, ointment, lotion, etc).   Follow up appointment: You are scheduled to follow up with Dr. Thad Ranger. If you do not know when your follow up appointment is, please call [phone] to schedule your appointment. Our office is located at [address] [place], [place], [address].  To reach our office with any questions or concerns please call [phone].

## 2024-01-18 NOTE — Progress Notes (Signed)
 Orthopaedic Surgery Progress Note Comfortable in PACU. NAE  Exam: Left Lower Extremity: Dressings c/d/i + ankle dorsiflexion/plantarflexion/EHL SILT SP/DP/T Foot wwp   Assessment: 89 y.o. female * Day of Surgery * status post left hip fracture cephalomedullary nail internal fixation  Plan: Full weightbearing as tolerated using a walker Dressing: keep clean and dry, OK to change with regular dry gauze if needed OK to resume DVT chemoppx or anticoagulation on post op day 1; recommend 4 weeks of chemoppx with ASA 81 BID if no other AC is in place Perioperative ancef for 24 PT beginning on POD#1 Discharge instructions placed in Epic

## 2024-01-18 NOTE — Progress Notes (Signed)
 S/p Left femur cephalomedullary nail for internal fixation of intertrochanteric hip fracture, 2/20   01/18/24 1532  TOC Brief Assessment  Insurance and Status Reviewed  Patient has primary care physician Yes  Home environment has been reviewed From home alone . Supportive daughters.  Prior level of function: PTA independent with ADL's. Has cane, rollator @ home.  Prior/Current Home Services No current home services  Social Drivers of Health Review SDOH reviewed no interventions necessary  Readmission risk has been reviewed No  Transition of care needs transition of care needs identified, TOC will continue to follow   PT/OT  evaluations pending...  TOC team following and will assist with needs. Gae Gallop RN,BSN, [place] [phone]

## 2024-01-18 NOTE — Hospital Course (Signed)
 89 y.o. female with medical history significant for persistent atrial fibrillation chronically anticoagulated on Eliquis, essential hypertension, who is admitted to [facility] on 01/17/2024 with acute left intertrochanteric hip fracture after presenting from home to [facility] ED complaining of fall. Pt was found to have acute L hip fracture. Orthopedic Surgery was consulted

## 2024-01-18 NOTE — ED Notes (Signed)
 Help get patient straighten up in the bed on the monitor patient is resting with call bell in reach

## 2024-01-19 ENCOUNTER — Encounter (HOSPITAL_COMMUNITY): Payer: Self-pay | Admitting: Sports Medicine

## 2024-01-19 DIAGNOSIS — S72142A Displaced intertrochanteric fracture of left femur, initial encounter for closed fracture: Secondary | ICD-10-CM | POA: Diagnosis not present

## 2024-01-19 DIAGNOSIS — W19XXXA Unspecified fall, initial encounter: Secondary | ICD-10-CM | POA: Diagnosis not present

## 2024-01-19 LAB — CBC
HCT: 32.8 % — ABNORMAL LOW (ref 36.0–46.0)
Hemoglobin: 10.9 g/dL — ABNORMAL LOW (ref 12.0–15.0)
MCH: 31.2 pg (ref 26.0–34.0)
MCHC: 33.2 g/dL (ref 30.0–36.0)
MCV: 94 fL (ref 80.0–100.0)
Platelets: 164 10*3/uL (ref 150–400)
RBC: 3.49 MIL/uL — ABNORMAL LOW (ref 3.87–5.11)
RDW: 13.5 % (ref 11.5–15.5)
WBC: 12.1 10*3/uL — ABNORMAL HIGH (ref 4.0–10.5)
nRBC: 0 % (ref 0.0–0.2)

## 2024-01-19 LAB — COMPREHENSIVE METABOLIC PANEL
ALT: 13 U/L (ref 0–44)
AST: 23 U/L (ref 15–41)
Albumin: 3 g/dL — ABNORMAL LOW (ref 3.5–5.0)
Alkaline Phosphatase: 40 U/L (ref 38–126)
Anion gap: 11 (ref 5–15)
BUN: 11 mg/dL (ref 8–23)
CO2: 27 mmol/L (ref 22–32)
Calcium: 9 mg/dL (ref 8.9–10.3)
Chloride: 98 mmol/L (ref 98–111)
Creatinine, Ser: 0.78 mg/dL (ref 0.44–1.00)
GFR, Estimated: >60 mL/min (ref >60)
Glucose, Bld: 128 mg/dL — ABNORMAL HIGH (ref 70–99)
Potassium: 4.5 mmol/L (ref 3.5–5.1)
Sodium: 136 mmol/L (ref 135–145)
Total Bilirubin: 0.8 mg/dL (ref 0.0–1.2)
Total Protein: 6.9 g/dL (ref 6.5–8.1)

## 2024-01-19 MED ORDER — APIXABAN 2.5 MG PO TABS
2.5000 mg | ORAL_TABLET | Freq: Two times a day (BID) | ORAL | Status: DC
  Administered 2024-01-20 – 2024-01-23 (×7): 2.5 mg via ORAL
  Filled 2024-01-19 (×7): qty 1

## 2024-01-19 MED ORDER — PROCHLORPERAZINE EDISYLATE 10 MG/2ML IJ SOLN
5.0000 mg | Freq: Four times a day (QID) | INTRAMUSCULAR | Status: AC | PRN
  Administered 2024-01-19 – 2024-01-20 (×2): 5 mg via INTRAVENOUS
  Filled 2024-01-19 (×2): qty 2

## 2024-01-19 NOTE — Progress Notes (Signed)
   01/19/24 0758  Vitals  Temp 98 F (36.7 C)  Temp Source Oral  BP (!) 129/59  MAP (mmHg) 72  BP Location Left Arm  BP Method Automatic  Patient Position (if appropriate) Lying  Pulse Rate (!) 18  Resp 18  MEWS COLOR  MEWS Score Color Yellow  Oxygen Therapy  SpO2 98 %  O2 Device Nasal Cannula  MEWS Score  MEWS Temp 0  MEWS Systolic 0  MEWS Pulse 2  MEWS RR 0  MEWS LOC 0  MEWS Score 2   Pt is currently in Afib. Will recheck Pulse.

## 2024-01-19 NOTE — Evaluation (Signed)
 Physical Therapy Evaluation  Patient Details Name: Johnnye Sandford MRN: [id number] DOB: 07-Nov-1930 Today's Date: 01/19/2024  History of Present Illness  Pt is a 89 y/o female who presents 01/17/2024 s/p fall, sustaining a L intertrochanteric femur fracture. She is now s/p L femur cephalomedullary nail on 01/18/2024. PMH significant for A-fib, HTN, osteoporosis.  Clinical Impression  Pt admitted with above diagnosis. Pt currently with functional limitations due to the deficits listed below (see PT Problem List). At the time of PT eval pt was able to perform transfers with up to +2 mod assist. Stedy utilized for transfer BSC>chair at end of session due to symptomatic hypotension. Recommend post-acute rehab <3 hours/day to maximize functional independence, safety, and facilitate return home with family support. Pt will benefit from acute skilled PT to increase their independence and safety with mobility to allow discharge.       Orthostatic BPs  Sitting symptomatic 97/56  Sitting after transfer to [facility] - symptomatic 94/58  Sitting in chair at end of session - symptomatic 81/55  Reclined in chair end of session - asymptomatic 99/49         If plan is discharge home, recommend the following: Two people to help with walking and/or transfers;Two people to help with bathing/dressing/bathroom;Assistance with cooking/housework;Assist for transportation;Help with stairs or ramp for entrance   Can travel by private vehicle        Equipment Recommendations None recommended by PT  Recommendations for Other Services       Functional Status Assessment Patient has had a recent decline in their functional status and demonstrates the ability to make significant improvements in function in a reasonable and predictable amount of time.     Precautions / Restrictions Precautions Precautions: Fall Recall of Precautions/Restrictions: Intact Restrictions Weight Bearing Restrictions Per Provider Order: Yes LLE  Weight Bearing Per Provider Order: Weight bearing as tolerated      Mobility  Bed Mobility Overal bed mobility: Needs Assistance Bed Mobility: Supine to Sit     Supine to sit: Mod assist, +2 for physical assistance     General bed mobility comments: Assist for LE advancement towards EOB, trunk elevation to full sitting position, and scooting around to get feet on the floor. Increased time required.    Transfers Overall transfer level: Needs assistance Equipment used: 2 person hand held assist, Ambulation equipment used Transfers: Sit to/from Stand, Bed to chair/wheelchair/BSC Sit to Stand: Mod assist, +2 physical assistance           General transfer comment: +2 assist required for power up to full stand. Pt had difficulty weight bearing through LLE but was able to take a few pivotal steps around from bed>BSC. Stedy utilized for [facility] to chair due to soft BP. Transfer via Lift Equipment: Stedy  Ambulation/Gait               General Gait Details: Deferred 2 hypotension  Stairs            Wheelchair Mobility     Tilt Bed    Modified Rankin (Stroke Patients Only)       Balance Overall balance assessment: Needs assistance Sitting-balance support: Feet supported, No upper extremity supported Sitting balance-Leahy Scale: Fair     Standing balance support: Bilateral upper extremity supported, During functional activity, Reliant on assistive device for balance Standing balance-Leahy Scale: Poor  Pertinent Vitals/Pain Pain Assessment Pain Assessment: Faces Faces Pain Scale: Hurts even more Pain Location: L hip Pain Descriptors / Indicators: Operative site guarding, Sore Pain Intervention(s): Limited activity within patient's tolerance, Monitored during session, Repositioned    Home Living Family/patient expects to be discharged to:: Private residence Living Arrangements: Alone Available Help at Discharge:  Family;Available 24 hours/day (Daughter and SIL) Type of Home: House Home Access: Stairs to enter Entrance Stairs-Rails: Left Entrance Stairs-Number of Steps: 4-5 (+4)   Home Layout: One level Home Equipment: Grab bars - tub/shower;Cane - single Librarian, academic (2 wheels) Additional Comments: 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 2-3 falls PTA from tripping over cords, 1 fall from stool PTA that led to admission    Prior Function Prior Level of Function : Independent/Modified Independent;Driving;History of Falls (last six months)             Mobility Comments: no AD ADLs Comments: Ind     Extremity/Trunk Assessment   Upper Extremity Assessment Upper Extremity Assessment: Defer to OT evaluation    Lower Extremity Assessment Lower Extremity Assessment: LLE deficits/detail LLE Deficits / Details: Acute pain, decreased strength and AROM consistent with pre-op diagnosis and subsequent surgery.    Cervical / Trunk Assessment Cervical / Trunk Assessment: Other exceptions Cervical / Trunk Exceptions: Forward head posture with rounded shoulders  Communication   Communication Communication: No apparent difficulties    Cognition Arousal: Alert Behavior During Therapy: WFL for tasks assessed/performed   PT - Cognitive impairments: No apparent impairments                         Following commands: Intact       Cueing Cueing Techniques: Verbal cues, Gestural cues     General Comments      Exercises     Assessment/Plan    PT Assessment Patient needs continued PT services  PT Problem List Decreased strength;Decreased range of motion;Decreased activity tolerance;Decreased balance;Decreased mobility;Decreased knowledge of use of DME;Decreased safety awareness;Decreased knowledge of precautions;Pain       PT Treatment Interventions DME instruction;Gait training;Functional mobility training;Therapeutic activities;Therapeutic exercise;Balance  training;Patient/family education;Stair training    PT Goals (Current goals can be found in the Care Plan section)  Acute Rehab PT Goals Patient Stated Goal: Return home at d/c PT Goal Formulation: All assessment and education complete, DC therapy Time For Goal Achievement: 02/02/24 Potential to Achieve Goals: Good    Frequency Min 1X/week     Co-evaluation PT/OT/SLP Co-Evaluation/Treatment: Yes Reason for Co-Treatment: Necessary to address cognition/behavior during functional activity;For patient/therapist safety;To address functional/ADL transfers PT goals addressed during session: Mobility/safety with mobility;Balance;Proper use of DME;Strengthening/ROM         AM-PAC PT "6 Clicks" Mobility  Outcome Measure Help needed turning from your back to your side while in a flat bed without using bedrails?: A Lot Help needed moving from lying on your back to sitting on the side of a flat bed without using bedrails?: A Lot Help needed moving to and from a bed to a chair (including a wheelchair)?: Total Help needed standing up from a chair using your arms (e.g., wheelchair or bedside chair)?: Total Help needed to walk in hospital room?: Total Help needed climbing 3-5 steps with a railing? : Total 6 Click Score: 8    End of Session Equipment Utilized During Treatment: Gait belt Activity Tolerance: Treatment limited secondary to medical complications (Comment) (hypotension) Patient left: in chair;with call bell/phone within reach;with chair alarm set  Nurse Communication: Mobility status;Other (comment) (BP status) PT Visit Diagnosis: Unsteadiness on feet (R26.81);Pain Pain - Right/Left: Left Pain - part of body: Hip    Time: 1610-9604 PT Time Calculation (min) (ACUTE ONLY): 42 min   Charges:   PT Evaluation $PT Eval Moderate Complexity: 1 Mod PT Treatments $Gait Training: 8-22 mins PT General Charges $$ ACUTE PT VISIT: 1 Visit         Conni Slipper, PT, DPT Acute  Rehabilitation Services Secure Chat Preferred Office: [phone]   Marylynn Pearson 01/19/2024, 3:23 PM

## 2024-01-19 NOTE — Anesthesia Postprocedure Evaluation (Signed)
 Anesthesia Post Note  Patient: Evelyn [name]  Procedure(s) Performed: LEFT FEMUR CEPHALOMEDULLARY NAIL (Left: Hip)     Patient location during evaluation: PACU Anesthesia Type: General Level of consciousness: awake and alert Pain management: pain level controlled Vital Signs Assessment: post-procedure vital signs reviewed and stable Respiratory status: spontaneous breathing, nonlabored ventilation, respiratory function stable and patient connected to nasal cannula oxygen Cardiovascular status: blood pressure returned to baseline and stable Postop Assessment: no apparent nausea or vomiting Anesthetic complications: no   No notable events documented.  Last Vitals:  Vitals:   01/19/24 0350 01/19/24 0758  BP: 106/68 (!) 129/59  Pulse: 74 (!) 18  Resp: 18 18  Temp: 36.9 C 36.7 C  SpO2: 98% 98%    Last Pain:  Vitals:   01/19/24 0855  TempSrc:   PainSc: 8                  [place]

## 2024-01-19 NOTE — Plan of Care (Signed)

## 2024-01-19 NOTE — TOC Initial Note (Addendum)
 Transition of Care [facility]) - Initial/Assessment Note    Patient Details  Name: Evelyn [name] MRN: [id number] Date of Birth: 1930-07-19  Transition of Care [facility]) CM/SW Contact:    Epifanio Lesches, RN Phone Number: 01/19/2024, 4:27 PM  Clinical Narrative:     - S/P Left femur cephalomedullary nail for internal fixation of intertrochanteric hip fracture, 2/20 Pt from home alone. Supportive daughter. PTA independent with ADL's, no DME usage.         RNCM received consult for possible SNF placement at time of discharge. RNCM spoke with patient regarding PT recommendation of SNF placement at time of discharge. Patient reported she  is currently unable to care for self independently at home given her current physical needs and fall risk. Patient expressed understanding of PT recommendation and is agreeable to SNF placement at time of discharge. Patient reports preference for [facility] or [facility] discussed insurance authorization process and provided Medicare SNF ratings list. Patient expressed being hopeful for rehab and to feel better soon. No further questions reported at this time. RNCM to continue to follow and assist with discharge planning needs.   Expected Discharge Plan: Skilled Nursing Facility Barriers to Discharge: Continued Medical Work up   Patient Goals and CMS Choice     Choice offered to / list presented to : Patient      Expected Discharge Plan and Services                                              Prior Living Arrangements/Services                       Activities of Daily Living   ADL Screening (condition at time of admission) Independently performs ADLs?: Yes (appropriate for developmental age) Is the patient deaf or have difficulty hearing?: No Does the patient have difficulty seeing, even when wearing glasses/contacts?: No Does the patient have difficulty concentrating, remembering, or making decisions?: No  Permission  Sought/Granted                  Emotional Assessment              Admission diagnosis:  Closed left hip fracture (HCC) [S72.002A] Closed displaced intertrochanteric fracture of left femur, initial encounter (HCC) [S72.142A] Fall, initial encounter [W19.XXXA] Contusion of head, unspecified part of head, initial encounter [S00.93XA] Patient Active Problem List   Diagnosis Date Noted   Closed displaced intertrochanteric fracture of left femur (HCC) 01/17/2024   Fall 01/17/2024   Leukocytosis 01/17/2024   Change in bowel function 02/01/2022   Hyperlipidemia 04/14/2020   Seasonal allergies 11/27/2019   Persistent atrial fibrillation (HCC) 03/26/2019   Anticoagulated 03/26/2019   Loose stools 04/14/2015   Osteoarthritis 04/14/2015   Chronic flank pain 06/17/2013   Plantar fasciitis 05/25/2013   PAD (peripheral artery disease) (HCC) 03/07/2013   Acute hip pain, left 02/05/2013   CARDIAC MURMUR 06/29/2010   VARICOSE VEINS, LOWER EXTREMITIES 06/02/2010   PLANTAR WART, LEFT 04/03/2009   Benign neoplasm of skin 04/03/2009   HIP PAIN, RIGHT 04/03/2009   Dyslipidemia, goal LDL below 70 06/29/2007   ARTIFICIAL MENOPAUSE 06/29/2007   Pain in limb 06/29/2007   Personal history presenting hazards to health 06/29/2007   Unspecified glaucoma 04/27/2007   Essential hypertension 04/27/2007   Osteoporosis 04/27/2007   PCP:  Donato Schultz, DO Pharmacy:   [id number] - [place], [place] - [address] AT [facility] [address] [place] [address] Phone: [phone] Fax: [phone]  [id number] [place], [place] - [address] AT [facility] [address] [place] [address] Phone: [phone] Fax: [phone]     Social Drivers of Health (SDOH) Social History: SDOH Screenings   Food Insecurity: No Food Insecurity (01/18/2024)  Housing: Low Risk  (01/19/2024)  Transportation Needs: No  Transportation Needs (01/18/2024)  Utilities: Not At Risk (01/18/2024)  Alcohol Screen: Low Risk  (02/22/2023)  Depression (PHQ2-9): Low Risk  (02/22/2023)  Financial Resource Strain: Low Risk  (03/17/2023)  Physical Activity: Insufficiently Active (03/17/2023)  Social Connections: Moderately Integrated (01/19/2024)  Stress: No Stress Concern Present (09/21/2021)  Tobacco Use: Medium Risk (01/18/2024)   SDOH Interventions:     Readmission Risk Interventions     No data to display

## 2024-01-19 NOTE — Progress Notes (Signed)
  Progress Note   Patient: Evelyn [name] QMV:[id number] DOB: [birth year] DOA: 01/17/2024     2 DOS: the patient was seen and examined on 01/19/2024   Brief hospital course: 89 y.o. female with medical history significant for persistent atrial fibrillation chronically anticoagulated on Eliquis, essential hypertension, who is admitted to [facility] on 01/17/2024 with acute left intertrochanteric hip fracture after presenting from home to [facility] ED complaining of fall. Pt was found to have acute L hip fracture. Orthopedic Surgery was consulted   Assessment and Plan: #) Acute left intertrochanteric hip fracture:  -confirmed via presenting plain films and stemming from mechanical fall without associated loss of consciousness  -Orthopedic Surgery consulted. Pt now s/p L hip intertrochanteric femur fracture on 2/20 -therapy recs for SNF noted. F/u with TOC   #) Mechanical fall:  -Pt describes mechanical fall off step stool/ladder -therapy recs for SNF  #) Leukocytosis:  -Suspect reactive secondary to acute fracture -No evidence of acute infection at this time  #) Persistent atrial fibrillation:  -CHA2DS2-VASc score of  4 -Currently rate controlled -resumed eliquis, was OK to resume per Ortho    #) Essential Hypertension:  -BP stable and controlled -Currently continued on metoprolol     Subjective: Without complaints this AM  Physical Exam: Vitals:   01/19/24 0350 01/19/24 0500 01/19/24 0758 01/19/24 1500  BP: 106/68  (!) 129/59 (!) 105/90  Pulse: 74  (!) 18 63  Resp: 18  18 18   Temp: 98.5 F (36.9 C)  98 F (36.7 C) 98 F (36.7 C)  TempSrc: Oral  Oral Oral  SpO2: 98%  98% 98%  Weight:  54.3 kg    Height:       General exam: Conversant, in no acute distress Respiratory system: normal chest rise, clear, no audible wheezing Cardiovascular system: regular rhythm, s1-s2 Gastrointestinal system: Nondistended, nontender, pos BS Central nervous system: No seizures, no  tremors Extremities: No cyanosis, no joint deformities Skin: No rashes, no pallor Psychiatry: Affect normal // no auditory hallucinations   Data Reviewed:  Labs reviewed: Na 136, K 4.5, Cr 0.78, WBC 12.1, hgb 10.9, Plts 164  Family Communication: Pt in room, family not at bedside  Disposition: Status is: Inpatient Remains inpatient appropriate because: severity of illness  Planned Discharge Destination: Skilled nursing facility     Author: Rickey Barbara, MD 01/19/2024 4:12 PM  For on call review [email or web address].

## 2024-01-19 NOTE — Evaluation (Signed)
 Occupational Therapy Evaluation Patient Details Name: Evelyn [name] MRN: [id number] DOB: 11-08-1930 Today's Date: 01/19/2024   History of Present Illness   Pt is a 89 y/o female who presents 01/17/2024 s/p fall, sustaining a L intertrochanteric femur fracture. She is now s/p L femur cephalomedullary nail on 01/18/2024. PMH significant for A-fib, HTN, osteoporosis.     Clinical Impressions Pt admitted for above, PTA pt was ind in mobility and ADLs. Pt currently limited by pain in LLE and inability to fully bear weight on operated limb, needing Mod A +2 to help stand and pivot transfer. Pt also needing Max A to setup assist for ADLs. Acute OT to continue following pt acutely to address listed deficits and help progress pt as able. Given pt deficits and that she lives alone, Patient would benefit from post acute skilled rehab facility with <3 hours of therapy and 24/7 support      If plan is discharge home, recommend the following:   A lot of help with walking and/or transfers;A lot of help with bathing/dressing/bathroom;Two people to help with walking and/or transfers;Assistance with cooking/housework;Assist for transportation;Help with stairs or ramp for entrance     Functional Status Assessment   Patient has had a recent decline in their functional status and demonstrates the ability to make significant improvements in function in a reasonable and predictable amount of time.     Equipment Recommendations   None recommended by OT;Other (comment) (defer)     Recommendations for Other Services         Precautions/Restrictions   Precautions Precautions: Fall Recall of Precautions/Restrictions: Intact Restrictions Weight Bearing Restrictions Per Provider Order: Yes LLE Weight Bearing Per Provider Order: Weight bearing as tolerated     Mobility Bed Mobility Overal bed mobility: Needs Assistance Bed Mobility: Supine to Sit     Supine to sit: Mod assist, +2 for physical  assistance     General bed mobility comments: Assist for LE advancement towards EOB, trunk elevation to full sitting position, and scooting around to get feet on the floor. Increased time required.    Transfers Overall transfer level: Needs assistance Equipment used: 2 person hand held assist, Ambulation equipment used Transfers: Sit to/from Stand, Bed to chair/wheelchair/BSC Sit to Stand: Mod assist, +2 physical assistance           General transfer comment: +2 assist required for power up to full stand. Pt had difficulty weight bearing through LLE but was able to take a few pivotal steps around from bed>BSC. Stedy utilized for [facility] to chair due to soft BP. Transfer via Lift Equipment: Stedy    Balance Overall balance assessment: Needs assistance Sitting-balance support: Feet supported, No upper extremity supported Sitting balance-Leahy Scale: Fair     Standing balance support: Bilateral upper extremity supported, During functional activity, Reliant on assistive device for balance Standing balance-Leahy Scale: Poor                             ADL either performed or assessed with clinical judgement   ADL Overall ADL's : Needs assistance/impaired Eating/Feeding: Independent;Sitting   Grooming: Sitting;Set up   Upper Body Bathing: Sitting;Set up   Lower Body Bathing: Sitting/lateral leans;Moderate assistance   Upper Body Dressing : Sitting;Set up   Lower Body Dressing: Sitting/lateral leans;Maximal assistance Lower Body Dressing Details (indicate cue type and reason): able to doff/don R sock without assist, Max A to doff/don L sock Toilet Transfer: Moderate assistance;Stand-pivot;BSC/3in1  Toileting- Clothing Manipulation and Hygiene: Maximal assistance;Sit to/from stand       Functional mobility during ADLs: +2 for physical assistance;Moderate assistance (for pivots)       Vision         Perception         Praxis         Pertinent  Vitals/Pain Pain Assessment Pain Assessment: Faces Faces Pain Scale: Hurts even more Pain Location: L hip Pain Descriptors / Indicators: Operative site guarding, Sore Pain Intervention(s): Limited activity within patient's tolerance, Monitored during session, Repositioned     Extremity/Trunk Assessment Upper Extremity Assessment Upper Extremity Assessment: Generalized weakness   Lower Extremity Assessment Lower Extremity Assessment: LLE deficits/detail LLE Deficits / Details: Acute pain, decreased strength and AROM consistent with pre-op diagnosis and subsequent surgery.   Cervical / Trunk Assessment Cervical / Trunk Assessment: Other exceptions Cervical / Trunk Exceptions: Forward head posture with rounded shoulders   Communication Communication Communication: No apparent difficulties   Cognition Arousal: Alert Behavior During Therapy: WFL for tasks assessed/performed Cognition: No apparent impairments                               Following commands: Intact       Cueing  General Comments   Cueing Techniques: Verbal cues;Gestural cues      Exercises     Shoulder Instructions      Home Living Family/patient expects to be discharged to:: Private residence Living Arrangements: Alone Available Help at Discharge: Family;Available 24 hours/day (Daughter and SIL) Type of Home: House Home Access: Stairs to enter [employer] of Steps: 4-5 (+4) Entrance Stairs-Rails: Left Home Layout: One level     Bathroom Shower/Tub: Producer, television/film/video: Standard (+ counter)     Home Equipment: Grab bars - tub/shower;Cane - single Librarian, academic (2 wheels)   Additional Comments: 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 2-3 falls PTA from tripping over cords, 1 fall from stool PTA that led to admission      Prior Functioning/Environment Prior Level of Function : Independent/Modified Independent;Driving;History of Falls (last six months)              Mobility Comments: no AD ADLs Comments: Ind    OT Problem List: Decreased strength;Pain;Impaired balance (sitting and/or standing);Decreased knowledge of precautions   OT Treatment/Interventions: Self-care/ADL training;Visual/perceptual remediation/compensation;Therapeutic exercise;Balance training;Patient/family education;DME and/or AE instruction;Therapeutic activities      OT Goals(Current goals can be found in the care plan section)   Acute Rehab OT Goals Patient Stated Goal: To get better OT Goal Formulation: With patient Time For Goal Achievement: 02/02/24 Potential to Achieve Goals: Good ADL Goals Pt Will Perform Lower Body Bathing: sitting/lateral leans;with modified independence Pt Will Perform Lower Body Dressing: with set-up;with adaptive equipment;sitting/lateral leans Pt Will Transfer to Toilet: bedside commode;stand pivot transfer;with contact guard assist Pt Will Perform Toileting - Clothing Manipulation and hygiene: with supervision;sitting/lateral leans Pt/caregiver will Perform Home Exercise Program: Increased strength;With Supervision;With written HEP provided;Both right and left upper extremity;With theraband   OT Frequency:  Min 1X/week    Co-evaluation   Reason for Co-Treatment: Necessary to address cognition/behavior during functional activity;For patient/therapist safety;To address functional/ADL transfers PT goals addressed during session: Mobility/safety with mobility;Balance;Proper use of DME;Strengthening/ROM OT goals addressed during session: ADL's and self-care;Proper use of Adaptive equipment and DME      AM-PAC OT "6 Clicks" Daily Activity     Outcome Measure Help  from another person eating meals?: None Help from another person taking care of personal grooming?: A Little Help from another person toileting, which includes using toliet, bedpan, or urinal?: A Lot Help from another person bathing (including washing, rinsing,  drying)?: A Lot Help from another person to put on and taking off regular upper body clothing?: A Little Help from another person to put on and taking off regular lower body clothing?: A Lot 6 Click Score: 16   End of Session Equipment Utilized During Treatment: Gait belt;Other (comment) (stedy) Nurse Communication: Mobility status  Activity Tolerance: Patient tolerated treatment well Patient left: in chair;with call bell/phone within reach;with chair alarm set  OT Visit Diagnosis: Other abnormalities of gait and mobility (R26.89);Unsteadiness on feet (R26.81);Pain;History of falling (Z91.81) Pain - Right/Left: Left Pain - part of body: Leg                Time: 4098-1191 OT Time Calculation (min): 41 min Charges:  OT General Charges $OT Visit: 1 Visit OT Evaluation $OT Eval Moderate Complexity: 1 Mod  01/19/2024  AB, OTR/L  Acute Rehabilitation Services  Office: [phone]   Tristan Schroeder 01/19/2024, 3:32 PM

## 2024-01-20 ENCOUNTER — Other Ambulatory Visit: Payer: Self-pay

## 2024-01-20 DIAGNOSIS — W19XXXA Unspecified fall, initial encounter: Secondary | ICD-10-CM | POA: Diagnosis not present

## 2024-01-20 DIAGNOSIS — S72142A Displaced intertrochanteric fracture of left femur, initial encounter for closed fracture: Secondary | ICD-10-CM | POA: Diagnosis not present

## 2024-01-20 LAB — MAGNESIUM: Magnesium: 1.9 mg/dL (ref 1.7–2.4)

## 2024-01-20 LAB — COMPREHENSIVE METABOLIC PANEL
ALT: 11 U/L (ref 0–44)
AST: 24 U/L (ref 15–41)
Albumin: 2.9 g/dL — ABNORMAL LOW (ref 3.5–5.0)
Alkaline Phosphatase: 41 U/L (ref 38–126)
Anion gap: 16 — ABNORMAL HIGH (ref 5–15)
BUN: 19 mg/dL (ref 8–23)
CO2: 24 mmol/L (ref 22–32)
Calcium: 9.2 mg/dL (ref 8.9–10.3)
Chloride: 96 mmol/L — ABNORMAL LOW (ref 98–111)
Creatinine, Ser: 0.82 mg/dL (ref 0.44–1.00)
GFR, Estimated: >60 mL/min (ref >60)
Glucose, Bld: 112 mg/dL — ABNORMAL HIGH (ref 70–99)
Potassium: 3.9 mmol/L (ref 3.5–5.1)
Sodium: 136 mmol/L (ref 135–145)
Total Bilirubin: 1.3 mg/dL — ABNORMAL HIGH (ref 0.0–1.2)
Total Protein: 6.7 g/dL (ref 6.5–8.1)

## 2024-01-20 LAB — CBC
HCT: 34.5 % — ABNORMAL LOW (ref 36.0–46.0)
Hemoglobin: 11.4 g/dL — ABNORMAL LOW (ref 12.0–15.0)
MCH: 31.1 pg (ref 26.0–34.0)
MCHC: 33 g/dL (ref 30.0–36.0)
MCV: 94.3 fL (ref 80.0–100.0)
Platelets: 159 10*3/uL (ref 150–400)
RBC: 3.66 MIL/uL — ABNORMAL LOW (ref 3.87–5.11)
RDW: 13.6 % (ref 11.5–15.5)
WBC: 13.9 10*3/uL — ABNORMAL HIGH (ref 4.0–10.5)
nRBC: 0 % (ref 0.0–0.2)

## 2024-01-20 MED ORDER — METHOCARBAMOL 750 MG PO TABS
750.0000 mg | ORAL_TABLET | Freq: Four times a day (QID) | ORAL | Status: DC | PRN
  Administered 2024-01-20: 750 mg via ORAL
  Filled 2024-01-20: qty 1

## 2024-01-20 MED ORDER — METHOCARBAMOL 500 MG PO TABS
500.0000 mg | ORAL_TABLET | Freq: Three times a day (TID) | ORAL | Status: DC | PRN
  Administered 2024-01-20 – 2024-01-23 (×3): 500 mg via ORAL
  Filled 2024-01-20 (×3): qty 1

## 2024-01-20 MED ORDER — OXYCODONE HCL 5 MG PO TABS: 2.5000 mg | ORAL_TABLET | ORAL | Status: DC | PRN

## 2024-01-20 MED ORDER — LACTATED RINGERS IV BOLUS
1000.0000 mL | Freq: Once | INTRAVENOUS | Status: AC
  Administered 2024-01-20: 1000 mL via INTRAVENOUS

## 2024-01-20 MED ORDER — OXYCODONE HCL 5 MG PO TABS: 2.5000 mg | ORAL_TABLET | ORAL | 0 refills | Status: DC | PRN

## 2024-01-20 MED ORDER — METOPROLOL TARTRATE 12.5 MG HALF TABLET
12.5000 mg | ORAL_TABLET | Freq: Two times a day (BID) | ORAL | Status: DC
  Administered 2024-01-20 – 2024-01-21 (×3): 12.5 mg via ORAL
  Filled 2024-01-20 (×3): qty 1

## 2024-01-20 MED ORDER — OXYCODONE HCL 5 MG PO TABS
5.0000 mg | ORAL_TABLET | ORAL | Status: DC | PRN
  Administered 2024-01-20 – 2024-01-23 (×10): 5 mg via ORAL
  Filled 2024-01-20 (×10): qty 1

## 2024-01-20 NOTE — NC FL2 (Signed)
 [place] MEDICAID FL2 LEVEL OF CARE FORM     IDENTIFICATION  Patient Name: Evelyn [name] Birthdate: [birth year] Sex: female Admission Date (Current Location): 01/17/2024  [facility] and [place] Number:  Producer, television/film/video and Address:  [facility], [address], [place], [place] [address]      Provider Number: [id number]  Attending Physician Name and Address:  Jerald Kief, MD  Relative Name and Phone Number:       Current Level of Care: Hospital Recommended Level of Care: Skilled Nursing Facility Prior Approval Number:    Date Approved/Denied:   PASRR Number: [id number]  Discharge Plan: SNF    Current Diagnoses: Patient Active Problem List   Diagnosis Date Noted   Closed displaced intertrochanteric fracture of left femur (HCC) 01/17/2024   Fall 01/17/2024   Leukocytosis 01/17/2024   Change in bowel function 02/01/2022   Hyperlipidemia 04/14/2020   Seasonal allergies 11/27/2019   Persistent atrial fibrillation (HCC) 03/26/2019   Anticoagulated 03/26/2019   Loose stools 04/14/2015   Osteoarthritis 04/14/2015   Chronic flank pain 06/17/2013   Plantar fasciitis 05/25/2013   PAD (peripheral artery disease) (HCC) 03/07/2013   Acute hip pain, left 02/05/2013   CARDIAC MURMUR 06/29/2010   VARICOSE VEINS, LOWER EXTREMITIES 06/02/2010   PLANTAR WART, LEFT 04/03/2009   Benign neoplasm of skin 04/03/2009   HIP PAIN, RIGHT 04/03/2009   Dyslipidemia, goal LDL below 70 06/29/2007   ARTIFICIAL MENOPAUSE 06/29/2007   Pain in limb 06/29/2007   Personal history presenting hazards to health 06/29/2007   Unspecified glaucoma 04/27/2007   Essential hypertension 04/27/2007   Osteoporosis 04/27/2007    Orientation RESPIRATION BLADDER Height & Weight     Self, Time, Situation, Place  Normal Incontinent, External catheter Weight: 119 lb 14.9 oz (54.4 kg) Height:  5\' 2"  (157.5 cm)  BEHAVIORAL SYMPTOMS/MOOD NEUROLOGICAL BOWEL NUTRITION STATUS       Continent Diet (See dc summary)  AMBULATORY STATUS COMMUNICATION OF NEEDS Skin   Extensive Assist Verbally Surgical wounds (Closed incision on hip)                       Personal Care Assistance Level of Assistance  Bathing, Feeding, Dressing Bathing Assistance: Maximum assistance Feeding assistance: Limited assistance Dressing Assistance: Maximum assistance     Functional Limitations Info  Sight Sight Info: Impaired (Glasses)        SPECIAL CARE FACTORS FREQUENCY  PT (By licensed PT), OT (By licensed OT)     PT Frequency: 5x/week OT Frequency: 5x/week            Contractures Contractures Info: Not present    Additional Factors Info  Code Status, Allergies Code Status Info: Full Allergies Info: Vicodin (Hydrocodone-acetaminophen), Pletaal (Cilostazol), Statins           Current Medications (01/20/2024):  This is the current hospital active medication list Current Facility-Administered Medications  Medication Dose Route Frequency Provider Last Rate Last Admin   acetaminophen (TYLENOL) tablet 650 mg  650 mg Oral Q6H PRN Rosalee Kaufman, MD       Or   acetaminophen (TYLENOL) suppository 650 mg  650 mg Rectal Q6H PRN Rosalee Kaufman, MD       apixaban Everlene Balls) tablet 2.5 mg  2.5 mg Oral BID Jerald Kief, MD   2.5 mg at 01/20/24 1030   fentaNYL (SUBLIMAZE) injection 25 mcg  25 mcg Intravenous Q2H PRN Rosalee Kaufman, MD   25 mcg at  01/20/24 0703   melatonin tablet 3 mg  3 mg Oral QHS PRN Rosalee Kaufman, MD       methocarbamol (ROBAXIN) injection 500 mg  500 mg Intravenous Q8H PRN Rosalee Kaufman, MD   500 mg at 01/18/24 0243   metoprolol succinate (TOPROL-XL) 24 hr tablet 100 mg  100 mg Oral Daily Rosalee Kaufman, MD   100 mg at 01/18/24 1021   naloxone [facility]) injection 0.4 mg  0.4 mg Intravenous PRN Rosalee Kaufman, MD       ondansetron [facility]) injection 4 mg  4 mg Intravenous Q6H PRN Rosalee Kaufman, MD   4 mg at 01/19/24 1550   Oral care mouth rinse  15 mL Mouth  Rinse PRN Jerald Kief, MD       prochlorperazine (COMPAZINE) injection 5 mg  5 mg Intravenous Q6H PRN Anthoney Harada, NP   5 mg at 01/19/24 2111     Discharge Medications: Please see discharge summary for a list of discharge medications.  Relevant Imaging Results:  Relevant Lab Results:   Additional Information SSN: [id number] [id number]  Mearl Latin, LCSW

## 2024-01-20 NOTE — Plan of Care (Signed)

## 2024-01-20 NOTE — Progress Notes (Addendum)
 Orthopaedic Surgery Progress Note Feeling well this AM. Had some pain overnight. Got in the chair with therapy yesterday.  Exam: Left Lower Extremity: Dressings c/d/I 1+ knee effusion, no pain with gentle ROM or palpation + ankle dorsiflexion/plantarflexion/EHL SILT SP/DP/T Foot wwp   Assessment: 89 y.o. female 2 Days Post-Op status post left hip fracture cephalomedullary nail internal fixation  Plan: Full weightbearing as tolerated using a walker Dressing: keep clean and dry, OK to change with regular dry gauze if needed OK to resume DVT chemoppx or anticoagulation on post op day 1; recommend 4 weeks of chemoppx with ASA 81 BID if no other AC is in place Perioperative ancef for 24 PT Discharge instructions placed in Epic

## 2024-01-20 NOTE — TOC Progression Note (Addendum)
 Transition of Care [facility]) - Progression Note    Patient Details  Name: Evelyn [name] MRN: [id number] Date of Birth: 04/21/1930  [facility] CM/SW Contact  Mearl Latin, LCSW Phone Number: 01/20/2024, 10:45 AM  Clinical Narrative:    CSW requested [facility] and [facility] review referral as patient requested but [facility] may be out of network.   [facility] reported they will be able to check bed status on Monday.   Expected Discharge Plan: Skilled Nursing Facility Barriers to Discharge: Continued Medical Work up  Expected Discharge Plan and Services                                               Social Determinants of Health (SDOH) Interventions SDOH Screenings   Food Insecurity: No Food Insecurity (01/18/2024)  Housing: Low Risk  (01/19/2024)  Transportation Needs: No Transportation Needs (01/18/2024)  Utilities: Not At Risk (01/18/2024)  Alcohol Screen: Low Risk  (02/22/2023)  Depression (PHQ2-9): Low Risk  (02/22/2023)  Financial Resource Strain: Low Risk  (03/17/2023)  Physical Activity: Insufficiently Active (03/17/2023)  Social Connections: Moderately Integrated (01/19/2024)  Stress: No Stress Concern Present (09/21/2021)  Tobacco Use: Medium Risk (01/18/2024)    Readmission Risk Interventions     No data to display

## 2024-01-20 NOTE — Plan of Care (Signed)
  Problem: Health Behavior/Discharge Planning: Goal: Ability to manage health-related needs will improve Outcome: Progressing   Problem: Clinical Measurements: Goal: Ability to maintain clinical measurements within normal limits will improve Outcome: Progressing Goal: Will remain free from infection Outcome: Progressing Goal: Diagnostic test results will improve Outcome: Progressing Goal: Respiratory complications will improve Outcome: Progressing Goal: Cardiovascular complication will be avoided Outcome: Progressing   Problem: Activity: Goal: Risk for activity intolerance will decrease Outcome: Progressing   Problem: Coping: Goal: Level of anxiety will decrease Outcome: Progressing

## 2024-01-20 NOTE — Progress Notes (Signed)
  Progress Note   Patient: Evelyn [name] ZOX:[id number] DOB: Dec 17, 1929 DOA: 01/17/2024     3 DOS: the patient was seen and examined on 01/20/2024   Brief hospital course: 89 y.o. female with medical history significant for persistent atrial fibrillation chronically anticoagulated on Eliquis, essential hypertension, who is admitted to [facility] on 01/17/2024 with acute left intertrochanteric hip fracture after presenting from home to [facility] ED complaining of fall. Pt was found to have acute L hip fracture. Orthopedic Surgery was consulted   Assessment and Plan: #) Acute left intertrochanteric hip fracture:  -confirmed via presenting plain films and stemming from mechanical fall without associated loss of consciousness  -Orthopedic Surgery consulted. Pt now s/p L hip intertrochanteric femur fracture on 2/20 -therapy recs for SNF noted. F/u with TOC   #) Mechanical fall:  -Pt describes mechanical fall off step stool/ladder -therapy recs for SNF  #) Leukocytosis:  -Suspect reactive secondary to acute fracture -No evidence of acute infection at this time  #) Persistent atrial fibrillation:  -CHA2DS2-VASc score of  4 -In RVR this AM -Resumed metoprolol at lower dose given pt's soft bp this AM -resumed eliquis    #) Essential Hypertension:  -BP stable and controlled -Currently continued on metoprolol     Subjective: No complaints this AM  Physical Exam: Vitals:   01/20/24 0739 01/20/24 1030 01/20/24 1147 01/20/24 1506  BP: 97/69 90/64 133/78 101/62  Pulse: 73  (!) 104 (!) 111  Resp: 15  16 16   Temp: 98.3 F (36.8 C)  98.3 F (36.8 C) 98.6 F (37 C)  TempSrc: Oral  Oral Oral  SpO2: 95%   100%  Weight:      Height:       General exam: Awake, laying in bed, in nad Respiratory system: Normal respiratory effort, no wheezing Cardiovascular system: regular rate, s1, s2 Gastrointestinal system: Soft, nondistended, positive BS Central nervous system: CN2-12 grossly intact,  strength intact Extremities: Perfused, no clubbing Skin: Normal skin turgor, no notable skin lesions seen Psychiatry: Mood normal // no visual hallucinations   Data Reviewed:  Labs reviewed: Na 136, K 3.9, Cr 0.82, WBC 13.9, Hgb 11.4, Plts 159  Family Communication: Pt in room, family not at bedside  Disposition: Status is: Inpatient Remains inpatient appropriate because: severity of illness  Planned Discharge Destination: Skilled nursing facility     Author: Rickey Barbara, MD 01/20/2024 4:57 PM  For on call review [email or web address].

## 2024-01-21 DIAGNOSIS — S72142A Displaced intertrochanteric fracture of left femur, initial encounter for closed fracture: Secondary | ICD-10-CM | POA: Diagnosis not present

## 2024-01-21 DIAGNOSIS — W19XXXA Unspecified fall, initial encounter: Secondary | ICD-10-CM | POA: Diagnosis not present

## 2024-01-21 LAB — COMPREHENSIVE METABOLIC PANEL
ALT: 9 U/L (ref 0–44)
AST: 21 U/L (ref 15–41)
Albumin: 2.8 g/dL — ABNORMAL LOW (ref 3.5–5.0)
Alkaline Phosphatase: 41 U/L (ref 38–126)
Anion gap: 11 (ref 5–15)
BUN: 20 mg/dL (ref 8–23)
CO2: 27 mmol/L (ref 22–32)
Calcium: 9 mg/dL (ref 8.9–10.3)
Chloride: 96 mmol/L — ABNORMAL LOW (ref 98–111)
Creatinine, Ser: 0.85 mg/dL (ref 0.44–1.00)
GFR, Estimated: >60 mL/min (ref >60)
Glucose, Bld: 105 mg/dL — ABNORMAL HIGH (ref 70–99)
Potassium: 3.9 mmol/L (ref 3.5–5.1)
Sodium: 134 mmol/L — ABNORMAL LOW (ref 135–145)
Total Bilirubin: 1.6 mg/dL — ABNORMAL HIGH (ref 0.0–1.2)
Total Protein: 6.7 g/dL (ref 6.5–8.1)

## 2024-01-21 LAB — CBC
HCT: 32.5 % — ABNORMAL LOW (ref 36.0–46.0)
Hemoglobin: 10.8 g/dL — ABNORMAL LOW (ref 12.0–15.0)
MCH: 31.1 pg (ref 26.0–34.0)
MCHC: 33.2 g/dL (ref 30.0–36.0)
MCV: 93.7 fL (ref 80.0–100.0)
Platelets: 198 10*3/uL (ref 150–400)
RBC: 3.47 MIL/uL — ABNORMAL LOW (ref 3.87–5.11)
RDW: 13.4 % (ref 11.5–15.5)
WBC: 11.5 10*3/uL — ABNORMAL HIGH (ref 4.0–10.5)
nRBC: 0 % (ref 0.0–0.2)

## 2024-01-21 MED ORDER — METOPROLOL TARTRATE 25 MG PO TABS
25.0000 mg | ORAL_TABLET | Freq: Two times a day (BID) | ORAL | Status: DC
  Administered 2024-01-21: 25 mg via ORAL
  Filled 2024-01-21: qty 1

## 2024-01-21 MED ORDER — POLYETHYLENE GLYCOL 3350 17 G PO PACK
17.0000 g | PACK | Freq: Every day | ORAL | Status: DC
  Administered 2024-01-21 – 2024-01-23 (×3): 17 g via ORAL
  Filled 2024-01-21 (×3): qty 1

## 2024-01-21 MED ORDER — METOPROLOL TARTRATE 12.5 MG HALF TABLET
12.5000 mg | ORAL_TABLET | Freq: Once | ORAL | Status: AC
  Administered 2024-01-21: 12.5 mg via ORAL
  Filled 2024-01-21: qty 1

## 2024-01-21 MED ORDER — LABETALOL HCL 5 MG/ML IV SOLN: 5.0000 mg | INTRAVENOUS | Status: DC | PRN

## 2024-01-21 NOTE — Progress Notes (Signed)
  Progress Note   Patient: Evelyn [name] WUJ:[id number] DOB: [birth year] DOA: 01/17/2024     4 DOS: the patient was seen and examined on 01/21/2024   Brief hospital course: 89 y.o. female with medical history significant for persistent atrial fibrillation chronically anticoagulated on Eliquis, essential hypertension, who is admitted to [facility] on 01/17/2024 with acute left intertrochanteric hip fracture after presenting from home to [facility] ED complaining of fall. Pt was found to have acute L hip fracture. Orthopedic Surgery was consulted   Assessment and Plan: #) Acute left intertrochanteric hip fracture:  -confirmed via presenting plain films and stemming from mechanical fall without associated loss of consciousness  -Orthopedic Surgery consulted. Pt now s/p L hip intertrochanteric femur fracture on 2/20 -therapy recs for SNF noted. Will f/u with TOC   #) Mechanical fall:  -Pt describes mechanical fall off step stool/ladder -therapy recs for SNF  #) Leukocytosis:  -Suspect reactive secondary to acute fracture -No evidence of acute infection at this time -WBC trending down  #) Persistent atrial fibrillation:  -CHA2DS2-VASc score of  4 -In RVR recently -Resumed metoprolol at lower dose. Remains tachycardic. Will increase metoprolol to 25mg  -resumed eliquis    #) Essential Hypertension:  -BP stable and controlled -Currently continued on metoprolol  #) dehydration -Pt reports decreased PO intake and episode of n/v yesterday -dry appearing mucus membranes -will continue pt on basal NS      Subjective: Feeling dehydrated this AM. Reported an episode of n/v yesterday and feeling constipated  Physical Exam: Vitals:   01/20/24 2203 01/21/24 0501 01/21/24 0549 01/21/24 0748  BP: 107/62 (!) 129/53  (!) 106/57  Pulse: 96 100  (!) 109  Resp:  16  18  Temp:  98.7 F (37.1 C)  98.6 F (37 C)  TempSrc:  Oral  Oral  SpO2:  93%  96%  Weight:   56.6 kg   Height:       General  exam: Conversant, in no acute distress, membranes dry Respiratory system: normal chest rise, clear, no audible wheezing Cardiovascular system: regular rhythm, s1-s2 Gastrointestinal system: Nondistended, nontender, pos BS Central nervous system: No seizures, no tremors Extremities: No cyanosis, no joint deformities Skin: No rashes, no pallor Psychiatry: Affect normal // no auditory hallucinations   Data Reviewed:  Labs reviewed: Na 134, K 3.9, Cr 0.85, WBC 11.5, Hgb 10.8  Family Communication: Pt in room, family not at bedside  Disposition: Status is: Inpatient Remains inpatient appropriate because: severity of illness  Planned Discharge Destination: Skilled nursing facility     Author: Rickey Barbara, MD 01/21/2024 1:55 PM  For on call review [email or web address].

## 2024-01-21 NOTE — Plan of Care (Signed)

## 2024-01-21 NOTE — Plan of Care (Signed)

## 2024-01-22 ENCOUNTER — Inpatient Hospital Stay (HOSPITAL_COMMUNITY): Payer: Medicare Other

## 2024-01-22 DIAGNOSIS — I4891 Unspecified atrial fibrillation: Secondary | ICD-10-CM

## 2024-01-22 DIAGNOSIS — S72142A Displaced intertrochanteric fracture of left femur, initial encounter for closed fracture: Secondary | ICD-10-CM | POA: Diagnosis not present

## 2024-01-22 DIAGNOSIS — W19XXXA Unspecified fall, initial encounter: Secondary | ICD-10-CM | POA: Diagnosis not present

## 2024-01-22 LAB — CBC
HCT: 30.8 % — ABNORMAL LOW (ref 36.0–46.0)
Hemoglobin: 10.3 g/dL — ABNORMAL LOW (ref 12.0–15.0)
MCH: 31.1 pg (ref 26.0–34.0)
MCHC: 33.4 g/dL (ref 30.0–36.0)
MCV: 93.1 fL (ref 80.0–100.0)
Platelets: 262 10*3/uL (ref 150–400)
RBC: 3.31 MIL/uL — ABNORMAL LOW (ref 3.87–5.11)
RDW: 13.2 % (ref 11.5–15.5)
WBC: 10.8 10*3/uL — ABNORMAL HIGH (ref 4.0–10.5)
nRBC: 0 % (ref 0.0–0.2)

## 2024-01-22 LAB — COMPREHENSIVE METABOLIC PANEL
ALT: 10 U/L (ref 0–44)
AST: 17 U/L (ref 15–41)
Albumin: 2.6 g/dL — ABNORMAL LOW (ref 3.5–5.0)
Alkaline Phosphatase: 41 U/L (ref 38–126)
Anion gap: 13 (ref 5–15)
BUN: 25 mg/dL — ABNORMAL HIGH (ref 8–23)
CO2: 25 mmol/L (ref 22–32)
Calcium: 8.9 mg/dL (ref 8.9–10.3)
Chloride: 93 mmol/L — ABNORMAL LOW (ref 98–111)
Creatinine, Ser: 0.91 mg/dL (ref 0.44–1.00)
GFR, Estimated: 59 mL/min — ABNORMAL LOW (ref >60)
Glucose, Bld: 107 mg/dL — ABNORMAL HIGH (ref 70–99)
Potassium: 4.4 mmol/L (ref 3.5–5.1)
Sodium: 131 mmol/L — ABNORMAL LOW (ref 135–145)
Total Bilirubin: 1.8 mg/dL — ABNORMAL HIGH (ref 0.0–1.2)
Total Protein: 6.7 g/dL (ref 6.5–8.1)

## 2024-01-22 LAB — ECHOCARDIOGRAM COMPLETE
Area-P 1/2: 6.54 cm2
Calc EF: 59.1 %
Height: 62 in
P 1/2 time: 590 ms
S' Lateral: 2.1 cm
Single Plane A2C EF: 61.5 %
Single Plane A4C EF: 58.5 %
Weight: 1996.49 [oz_av]

## 2024-01-22 MED ORDER — PROCHLORPERAZINE EDISYLATE 10 MG/2ML IJ SOLN: 10.0000 mg | INTRAMUSCULAR | Status: DC | PRN

## 2024-01-22 MED ORDER — SODIUM CHLORIDE 0.9 % IV SOLN: INTRAVENOUS | Status: DC

## 2024-01-22 MED ORDER — METOPROLOL TARTRATE 50 MG PO TABS
50.0000 mg | ORAL_TABLET | Freq: Two times a day (BID) | ORAL | Status: DC
  Administered 2024-01-22 – 2024-01-23 (×3): 50 mg via ORAL
  Filled 2024-01-22 (×3): qty 1

## 2024-01-22 NOTE — Progress Notes (Signed)
 Physical Therapy Treatment  Patient Details Name: Evelyn [name] MRN: [id number] DOB: 10-12-1930 Today's Date: 01/22/2024   History of Present Illness Pt is a 89 y/o female who presents 01/17/2024 s/p fall, sustaining a L intertrochanteric femur fracture. She is now s/p L femur cephalomedullary nail on 01/18/2024. PMH significant for A-fib, HTN, osteoporosis.    PT Comments  Pt progressing towards physical therapy goals. Was able to perform transfers and ambulation with up to +2 mod assist and RW for support. Overall improving from initial evaluation and good rehab effort. Will continue to follow and progress as able per POC.     If plan is discharge home, recommend the following: Two people to help with walking and/or transfers;Two people to help with bathing/dressing/bathroom;Assistance with cooking/housework;Assist for transportation;Help with stairs or ramp for entrance   Can travel by private vehicle        Equipment Recommendations  None recommended by PT    Recommendations for Other Services       Precautions / Restrictions Precautions Precautions: Fall Recall of Precautions/Restrictions: Intact Restrictions Weight Bearing Restrictions Per Provider Order: Yes LLE Weight Bearing Per Provider Order: Weight bearing as tolerated     Mobility  Bed Mobility Overal bed mobility: Needs Assistance Bed Mobility: Supine to Sit     Supine to sit: Min assist, Used rails     General bed mobility comments: Increased time but pt was able to transition to EOB with heavy min assist for LLE advancement torwards EOB and trunk stability during elevation and scoot out fully to EOB.    Transfers Overall transfer level: Needs assistance Equipment used: 2 person hand held assist, Ambulation equipment used Transfers: Sit to/from Stand Sit to Stand: Mod assist, +2 physical assistance           General transfer comment: VC's for hand placement on seated surface for safety. Pt was able to  power up to full stand with +2 assist. Increased time to gain/maintain standing balance.    Ambulation/Gait Ambulation/Gait assistance: Min assist Gait Distance (Feet): 10 Feet Assistive device: Rolling walker (2 wheels) Gait Pattern/deviations: Step-to pattern, Decreased stride length, Trunk flexed, Narrow base of support Gait velocity: Decreased Gait velocity interpretation: <1.31 ft/sec, indicative of household ambulator   General Gait Details: Deferred 2 hypotension   Stairs             Wheelchair Mobility     Tilt Bed    Modified Rankin (Stroke Patients Only)       Balance Overall balance assessment: Needs assistance Sitting-balance support: Feet supported, No upper extremity supported Sitting balance-Leahy Scale: Fair     Standing balance support: Bilateral upper extremity supported, During functional activity, Reliant on assistive device for balance Standing balance-Leahy Scale: Poor                              Communication Communication Communication: No apparent difficulties  Cognition Arousal: Alert Behavior During Therapy: WFL for tasks assessed/performed   PT - Cognitive impairments: No apparent impairments                         Following commands: Intact      Cueing Cueing Techniques: Verbal cues, Gestural cues  Exercises General Exercises - Lower Extremity Ankle Circles/Pumps: 10 reps Quad Sets: 10 reps Long Arc Quad: 10 reps    General Comments        Pertinent Vitals/Pain  Pain Assessment Pain Assessment: Faces Faces Pain Scale: Hurts little more Pain Location: L hip Pain Descriptors / Indicators: Operative site guarding, Sore Pain Intervention(s): Monitored during session, Limited activity within patient's tolerance, Repositioned    Home Living                          Prior Function            PT Goals (current goals can now be found in the care plan section) Acute Rehab PT  Goals Patient Stated Goal: Return home at d/c PT Goal Formulation: With patient Time For Goal Achievement: 02/02/24 Potential to Achieve Goals: Good Progress towards PT goals: Progressing toward goals    Frequency    Min 1X/week      PT Plan      Co-evaluation              AM-PAC PT "6 Clicks" Mobility   Outcome Measure  Help needed turning from your back to your side while in a flat bed without using bedrails?: A Lot Help needed moving from lying on your back to sitting on the side of a flat bed without using bedrails?: A Lot Help needed moving to and from a bed to a chair (including a wheelchair)?: A Lot Help needed standing up from a chair using your arms (e.g., wheelchair or bedside chair)?: Total Help needed to walk in hospital room?: Total Help needed climbing 3-5 steps with a railing? : Total 6 Click Score: 9    End of Session Equipment Utilized During Treatment: Gait belt Activity Tolerance: Patient tolerated treatment well Patient left: in chair;with call bell/phone within reach;with chair alarm set Nurse Communication: Mobility status;Other (comment) ([place] status) PT Visit Diagnosis: Unsteadiness on feet (R26.81);Pain Pain - Right/Left: Left Pain - part of body: Hip     Time: 1610-9604 PT Time Calculation (min) (ACUTE ONLY): 27 min  Charges:    $Gait Training: 8-22 mins $Therapeutic Exercise: 8-22 mins PT General Charges $$ ACUTE PT VISIT: 1 Visit                     Evelyn [name], PT, DPT Acute Rehabilitation Services Secure Chat Preferred Office: [phone]    Evelyn [name] 01/22/2024, 12:24 PM

## 2024-01-22 NOTE — TOC Progression Note (Addendum)
 Transition of Care [facility]) - Progression Note    Patient Details  Name: Evelyn [name] MRN: [id number] Date of Birth: May 30, 1930  [facility]) CM/SW Contact  Lorri Frederick, LCSW Phone Number: 01/22/2024, 10:40 AM  Clinical Narrative:   CSW spoke with pt regarding SNF bed offers: [facility] out of network, [facility] does offer bed.  Pt will accept offer at [facility].  CSW confirmed with [employer].    1300: SNF auth request submitted in Nav[employer] and approved: [id number], 3 days: 2/25-2/27.    Expected Discharge Plan: Skilled Nursing Facility Barriers to Discharge: Continued Medical Work up  Expected Discharge Plan and Services                                               Social Determinants of Health (SDOH) Interventions SDOH Screenings   Food Insecurity: No Food Insecurity (01/18/2024)  Housing: Low Risk  (01/19/2024)  Transportation Needs: No Transportation Needs (01/18/2024)  Utilities: Not At Risk (01/18/2024)  Alcohol Screen: Low Risk  (02/22/2023)  Depression (PHQ2-9): Low Risk  (02/22/2023)  Financial Resource Strain: Low Risk  (03/17/2023)  Physical Activity: Insufficiently Active (03/17/2023)  Social Connections: Moderately Integrated (01/19/2024)  Stress: No Stress Concern Present (09/21/2021)  Tobacco Use: Medium Risk (01/18/2024)    Readmission Risk Interventions     No data to display

## 2024-01-22 NOTE — Progress Notes (Signed)
  Echocardiogram 2D Echocardiogram has been performed.  Evelyn [name] 01/22/2024, 3:55 PM

## 2024-01-22 NOTE — Progress Notes (Signed)
  Progress Note   Patient: Evelyn [name] WUJ:[id number] DOB: 10-01-1930 DOA: 01/17/2024     5 DOS: the patient was seen and examined on 01/22/2024   Brief hospital course: 89 y.o. female with medical history significant for persistent atrial fibrillation chronically anticoagulated on Eliquis, essential hypertension, who is admitted to [facility] on 01/17/2024 with acute left intertrochanteric hip fracture after presenting from home to [facility] ED complaining of fall. Pt was found to have acute L hip fracture. Orthopedic Surgery was consulted   Assessment and Plan: #) Acute left intertrochanteric hip fracture:  -confirmed via presenting plain films and stemming from mechanical fall without associated loss of consciousness  -Orthopedic Surgery consulted. Pt now s/p L hip intertrochanteric femur fracture on 2/20 -therapy recs for SNF noted. TOC following   #) Mechanical fall:  -Pt describes mechanical fall off step stool/ladder -therapy recs for SNF  #) Leukocytosis:  -Suspect reactive secondary to acute fracture -No evidence of acute infection at this time -WBC trending down  #) Persistent atrial fibrillation:  -CHA2DS2-VASc score of  4 -Recently in RVR -Gradually increasing metoprolol dose as BP allows -resumed eliquis    #) Essential Hypertension:  -soft BP -Currently continued on metoprolol per above, titrating as bp allows per above  #) dehydration -Pt reports decreased PO intake and episode of n/v yesterday -dry appearing mucus membranes -will continue pt on basal NS   #) bladder outlet obstruction -minimal urine out put with >900cc on bladder scan today -will check bladder scan qshift and I/o if >300cc     Subjective: Feeling nauseated this AM and overnight  Physical Exam: Vitals:   01/21/24 1922 01/22/24 0537 01/22/24 0725 01/22/24 1325  BP: 110/63 114/76 119/75 (!) 102/58  Pulse: (!) 110 (!) 110 (!) 104 89  Resp:  17 16 16   Temp: 98 F (36.7 C) 98 F (36.7  C) 98.5 F (36.9 C) 98.1 F (36.7 C)  TempSrc: Oral Oral Oral Oral  SpO2: 95% 94% 96% 93%  Weight:      Height:       General exam: Awake, laying in bed, in nad Respiratory system: Normal respiratory effort, no wheezing Cardiovascular system: regular rate, s1, s2 Gastrointestinal system: Soft, nondistended, positive BS Central nervous system: CN2-12 grossly intact, strength intact Extremities: Perfused, no clubbing Skin: Normal skin turgor, no notable skin lesions seen Psychiatry: Mood normal // no visual hallucinations   Data Reviewed:  Labs reviewed: Na 131, K 4.4, Cr 0.91, WBC 10.8, Hgb 10.3  Family Communication: Pt in room, family not at bedside  Disposition: Status is: Inpatient Remains inpatient appropriate because: severity of illness  Planned Discharge Destination: Skilled nursing facility     Author: Rickey Barbara, MD 01/22/2024 5:23 PM  For on call review [email or web address].

## 2024-01-22 NOTE — Progress Notes (Signed)
 2D echo attempted, Patient in chair and stated she has extreme difficulty transferring to bed. Will try later

## 2024-01-22 NOTE — Care Management Important Message (Signed)
 Important Message  Patient Details  Name: Gordie Belvin MRN: [id number] Date of Birth: [birth year]   Important Message Given:  Yes - Medicare IM     Sherilyn Banker 01/22/2024, 3:46 PM

## 2024-01-23 DIAGNOSIS — E785 Hyperlipidemia, unspecified: Secondary | ICD-10-CM | POA: Diagnosis not present

## 2024-01-23 DIAGNOSIS — I10 Essential (primary) hypertension: Secondary | ICD-10-CM | POA: Diagnosis not present

## 2024-01-23 DIAGNOSIS — M6281 Muscle weakness (generalized): Secondary | ICD-10-CM | POA: Diagnosis not present

## 2024-01-23 DIAGNOSIS — Z9181 History of falling: Secondary | ICD-10-CM | POA: Diagnosis not present

## 2024-01-23 DIAGNOSIS — R2689 Other abnormalities of gait and mobility: Secondary | ICD-10-CM | POA: Diagnosis not present

## 2024-01-23 DIAGNOSIS — R1310 Dysphagia, unspecified: Secondary | ICD-10-CM | POA: Diagnosis not present

## 2024-01-23 DIAGNOSIS — M1990 Unspecified osteoarthritis, unspecified site: Secondary | ICD-10-CM | POA: Diagnosis not present

## 2024-01-23 DIAGNOSIS — I4819 Other persistent atrial fibrillation: Secondary | ICD-10-CM | POA: Diagnosis not present

## 2024-01-23 DIAGNOSIS — R011 Cardiac murmur, unspecified: Secondary | ICD-10-CM | POA: Diagnosis not present

## 2024-01-23 DIAGNOSIS — D239 Other benign neoplasm of skin, unspecified: Secondary | ICD-10-CM | POA: Diagnosis not present

## 2024-01-23 DIAGNOSIS — J302 Other seasonal allergic rhinitis: Secondary | ICD-10-CM | POA: Diagnosis not present

## 2024-01-23 DIAGNOSIS — I4891 Unspecified atrial fibrillation: Secondary | ICD-10-CM | POA: Diagnosis not present

## 2024-01-23 DIAGNOSIS — I8390 Asymptomatic varicose veins of unspecified lower extremity: Secondary | ICD-10-CM | POA: Diagnosis not present

## 2024-01-23 DIAGNOSIS — Z8781 Personal history of (healed) traumatic fracture: Secondary | ICD-10-CM | POA: Diagnosis not present

## 2024-01-23 DIAGNOSIS — R2681 Unsteadiness on feet: Secondary | ICD-10-CM | POA: Diagnosis not present

## 2024-01-23 DIAGNOSIS — M80852D Other osteoporosis with current pathological fracture, left femur, subsequent encounter for fracture with routine healing: Secondary | ICD-10-CM | POA: Diagnosis not present

## 2024-01-23 DIAGNOSIS — I739 Peripheral vascular disease, unspecified: Secondary | ICD-10-CM | POA: Diagnosis not present

## 2024-01-23 DIAGNOSIS — S72142A Displaced intertrochanteric fracture of left femur, initial encounter for closed fracture: Secondary | ICD-10-CM | POA: Diagnosis not present

## 2024-01-23 DIAGNOSIS — R269 Unspecified abnormalities of gait and mobility: Secondary | ICD-10-CM | POA: Diagnosis not present

## 2024-01-23 DIAGNOSIS — S72142D Displaced intertrochanteric fracture of left femur, subsequent encounter for closed fracture with routine healing: Secondary | ICD-10-CM | POA: Diagnosis not present

## 2024-01-23 DIAGNOSIS — W19XXXA Unspecified fall, initial encounter: Secondary | ICD-10-CM | POA: Diagnosis not present

## 2024-01-23 LAB — COMPREHENSIVE METABOLIC PANEL
ALT: 9 U/L (ref 0–44)
AST: 22 U/L (ref 15–41)
Albumin: 2.5 g/dL — ABNORMAL LOW (ref 3.5–5.0)
Alkaline Phosphatase: 39 U/L (ref 38–126)
Anion gap: 9 (ref 5–15)
BUN: 20 mg/dL (ref 8–23)
CO2: 26 mmol/L (ref 22–32)
Calcium: 8.2 mg/dL — ABNORMAL LOW (ref 8.9–10.3)
Chloride: 99 mmol/L (ref 98–111)
Creatinine, Ser: 0.77 mg/dL (ref 0.44–1.00)
GFR, Estimated: >60 mL/min (ref >60)
Glucose, Bld: 91 mg/dL (ref 70–99)
Potassium: 4.1 mmol/L (ref 3.5–5.1)
Sodium: 134 mmol/L — ABNORMAL LOW (ref 135–145)
Total Bilirubin: 1.4 mg/dL — ABNORMAL HIGH (ref 0.0–1.2)
Total Protein: 6 g/dL — ABNORMAL LOW (ref 6.5–8.1)

## 2024-01-23 LAB — CBC
HCT: 29.8 % — ABNORMAL LOW (ref 36.0–46.0)
Hemoglobin: 10 g/dL — ABNORMAL LOW (ref 12.0–15.0)
MCH: 31.4 pg (ref 26.0–34.0)
MCHC: 33.6 g/dL (ref 30.0–36.0)
MCV: 93.7 fL (ref 80.0–100.0)
Platelets: 273 10*3/uL (ref 150–400)
RBC: 3.18 MIL/uL — ABNORMAL LOW (ref 3.87–5.11)
RDW: 13.6 % (ref 11.5–15.5)
WBC: 9.4 10*3/uL (ref 4.0–10.5)
nRBC: 0 % (ref 0.0–0.2)

## 2024-01-23 MED ORDER — ONDANSETRON HCL 4 MG PO TABS: 4.0000 mg | ORAL_TABLET | Freq: Three times a day (TID) | ORAL | 0 refills | Status: DC | PRN

## 2024-01-23 MED ORDER — BISACODYL 10 MG RE SUPP
10.0000 mg | RECTAL | Status: AC
  Administered 2024-01-23: 10 mg via RECTAL
  Filled 2024-01-23: qty 1

## 2024-01-23 MED ORDER — MELATONIN 3 MG PO TABS: 3.0000 mg | ORAL_TABLET | Freq: Every evening | ORAL | Status: DC | PRN

## 2024-01-23 MED ORDER — POLYETHYLENE GLYCOL 3350 17 G PO PACK: 17.0000 g | PACK | Freq: Every day | ORAL | Status: DC

## 2024-01-23 MED ORDER — METOPROLOL TARTRATE 50 MG PO TABS: 50.0000 mg | ORAL_TABLET | Freq: Two times a day (BID) | ORAL | 0 refills | Status: DC

## 2024-01-23 NOTE — Discharge Summary (Signed)
 Physician Discharge Summary   Patient: Evelyn [name] MRN: [id number] DOB: Jun 17, 1930  Admit date:     01/17/2024  Discharge date: 01/23/24  Discharge Physician: Rickey Barbara   PCP: Donato Schultz, DO   Recommendations at discharge:    Follow up with PCP in 1-2 weeks Follow up with Orthopedic Surgery as needed Please titrate metoprolol as BP tolerates to ensure rate control Please ensure pt has regular bowel movements and provide additional cathartics as necessary  Discharge Diagnoses: Principal Problem:   Closed displaced intertrochanteric fracture of left femur (HCC) Active Problems:   Essential hypertension   Acute hip pain, left   Persistent atrial fibrillation (HCC)   Fall   Leukocytosis  Resolved Problems:   * No resolved hospital problems. *  Hospital Course: 89 y.o. female with medical history significant for persistent atrial fibrillation chronically anticoagulated on Eliquis, essential hypertension, who is admitted to [facility] on 01/17/2024 with acute left intertrochanteric hip fracture after presenting from home to [facility] ED complaining of fall. Pt was found to have acute L hip fracture. Orthopedic Surgery was consulted   Assessment and Plan: #) Acute left intertrochanteric hip fracture:  -confirmed via presenting plain films and stemming from mechanical fall without associated loss of consciousness  -Orthopedic Surgery consulted. Pt now s/p L hip intertrochanteric femur fracture on 2/20 -therapy recs for SNF noted.   #) Mechanical fall:  -Pt describes mechanical fall off step stool/ladder -therapy recs for SNF   #) Leukocytosis:  -Suspect reactive secondary to acute fracture -No evidence of acute infection at this time -WBC trending down to normal without antibiotics   #) Persistent atrial fibrillation:  -CHA2DS2-VASc score of  4 -Recently in RVR -Gradually increasing metoprolol dose as BP allows -recommend to gradually increase metoprolol as  blood pressure and HR tolerates -resumed eliquis    #) Essential Hypertension:  -soft BP -Currently continued on metoprolol per above, titrating as bp allows per above   #) dehydration -Pt reports decreased PO intake and episode of n/v yesterday -dry appearing mucus membranes -Improved with basal NS    #) bladder outlet obstruction -minimal urine out put with >900cc on bladder scan on 2/25 -resolved with I/O cath, voiding now  #) Constipation -Recommend cathartics as needed    Consultants: Orthopedic Surgery Procedures performed: L hip intertrochanteric femur fracture on 2/20  Disposition: Skilled nursing facility Diet recommendation:  Regular diet  DISCHARGE MEDICATION: Allergies as of 01/23/2024       Reactions   Vicodin [hydrocodone-acetaminophen] Other (See Comments)   Abdominal Pain   Pletaal [cilostazol] Diarrhea   Statins         Medication List     STOP taking these medications    amLODipine 5 MG tablet Commonly known as: NORVASC   Latanoprostene Bunod 0.024 % Soln   metoprolol succinate 100 MG 24 hr tablet Commonly known as: TOPROL-XL       TAKE these medications    ascorbic acid 500 MG tablet Commonly known as: VITAMIN C Take 500 mg by mouth daily.   calcium carbonate 600 MG Tabs tablet Commonly known as: OS-CAL Take 600 mg by mouth daily.   Eliquis 2.5 MG Tabs tablet Generic drug: apixaban TAKE 1 TABLET BY MOUTH TWICE DAILY   glucosamine-chondroitin 500-400 MG tablet Take 1 tablet by mouth daily.   melatonin 3 MG Tabs tablet Take 1 tablet (3 mg total) by mouth at bedtime as needed (insomnia).   metoprolol tartrate 50 MG tablet  Commonly known as: LOPRESSOR Take 1 tablet (50 mg total) by mouth 2 (two) times daily.   ondansetron 4 MG tablet Commonly known as: Zofran Take 1 tablet (4 mg total) by mouth every 8 (eight) hours as needed for nausea or vomiting.   oxyCODONE 5 MG immediate release tablet Commonly known as: Oxy  IR/ROXICODONE Take 0.5 tablets (2.5 mg total) by mouth every 4 (four) hours as needed for severe pain (pain score 7-10).   polyethylene glycol 17 g packet Commonly known as: MIRALAX / GLYCOLAX Take 17 g by mouth daily. Start taking on: January 24, 2024   Red Yeast Rice 600 MG Tabs Take 1 tablet by mouth daily.   timolol 0.5 % ophthalmic solution Commonly known as: TIMOPTIC 1 drop every morning.   VITAMIN D PO Take 1 capsule by mouth daily. 2000 units        Follow-up Information     Donato Schultz, DO Follow up.   Specialty: Family Medicine Contact information: [address] [place] [address] [phone]         Rosalee Kaufman, MD Follow up.   Specialty: Sports Medicine Why: As needed, Hospital follow up Contact information: [address] [place] [address] [phone]                Discharge Exam: Filed Weights   01/19/24 0500 01/20/24 0500 01/21/24 0549  Weight: 54.3 kg 54.4 kg 56.6 kg   General exam: Awake, laying in bed, in nad Respiratory system: Normal respiratory effort, no wheezing Cardiovascular system: regular rate, s1, s2 Gastrointestinal system: Soft, nondistended, positive BS Central nervous system: CN2-12 grossly intact, strength intact Extremities: Perfused, no clubbing Skin: Normal skin turgor, no notable skin lesions seen Psychiatry: Mood normal // no visual hallucinations   Condition at discharge: fair  The results of significant diagnostics from this hospitalization (including imaging, microbiology, ancillary and laboratory) are listed below for reference.   Imaging Studies: ECHOCARDIOGRAM COMPLETE Result Date: 01/22/2024    ECHOCARDIOGRAM REPORT   Patient Name:   Evelyn [name] ANN P Schnitker Date of Exam: 01/22/2024 Medical Rec #:  [id number]     Height:       62.0 in Accession #:    [id number]    Weight:       124.8 lb Date of Birth:  1930-11-17      BSA:          1.564 m Patient Age:    89 years      BP:            119/75 mmHg Patient Gender: F             HR:           92 bpm. Exam Location:  Inpatient Procedure: 2D Echo, Cardiac Doppler and Color Doppler (Both Spectral and Color            Flow Doppler were utilized during procedure). Indications:    I48.91* Unspeicified atrial fibrillation  History:        Patient has prior history of Echocardiogram examinations, most                 recent 02/05/2019. Abnormal ECG, Arrythmias:Atrial Fibrillation,                 Signs/Symptoms:Murmur; Risk Factors:Hypertension and                 Dyslipidemia.  Sonographer:    Sheralyn Boatman RDCS  Referring Phys: [id number] Malia Corsi K Omarie Parcell IMPRESSIONS  1. Left ventricular ejection fraction, by estimation, is 55 to 60%. The left ventricle has normal function. The left ventricle has no regional wall motion abnormalities. There is mild concentric left ventricular hypertrophy. Left ventricular diastolic function could not be evaluated.  2. Right ventricular systolic function is normal. The right ventricular size is normal. There is moderately elevated pulmonary artery systolic pressure.  3. Left atrial size was severely dilated.  4. Right atrial size was moderately dilated.  5. The mitral valve is normal in structure. Mild mitral valve regurgitation. No evidence of mitral stenosis.  6. Tricuspid valve regurgitation is severe.  7. The aortic valve is tricuspid. There is mild calcification of the aortic valve. Aortic valve regurgitation is mild. No aortic stenosis is present. Aortic regurgitation PHT measures 590 msec.  8. The inferior vena cava is normal in size with greater than 50% respiratory variability, suggesting right atrial pressure of 3 mmHg. FINDINGS  Left Ventricle: Left ventricular ejection fraction, by estimation, is 55 to 60%. The left ventricle has normal function. The left ventricle has no regional wall motion abnormalities. Strain imaging was not performed. The left ventricular internal cavity  size was normal in size. There is mild  concentric left ventricular hypertrophy. Left ventricular diastolic function could not be evaluated due to atrial fibrillation. Left ventricular diastolic function could not be evaluated. Right Ventricle: The right ventricular size is normal. No increase in right ventricular wall thickness. Right ventricular systolic function is normal. There is moderately elevated pulmonary artery systolic pressure. The tricuspid regurgitant velocity is 2.81 m/s, and with an assumed right atrial pressure of 15 mmHg, the estimated right ventricular systolic pressure is 46.6 mmHg. Left Atrium: Left atrial size was severely dilated. Right Atrium: Right atrial size was moderately dilated. Pericardium: There is no evidence of pericardial effusion. Mitral Valve: The mitral valve is normal in structure. Mild mitral annular calcification. Mild mitral valve regurgitation. No evidence of mitral valve stenosis. Tricuspid Valve: The tricuspid valve is normal in structure. Tricuspid valve regurgitation is severe. No evidence of tricuspid stenosis. Aortic Valve: The aortic valve is tricuspid. There is mild calcification of the aortic valve. Aortic valve regurgitation is mild. Aortic regurgitation PHT measures 590 msec. No aortic stenosis is present. Pulmonic Valve: The pulmonic valve was normal in structure. Pulmonic valve regurgitation is trivial. No evidence of pulmonic stenosis. Aorta: The aortic root is normal in size and structure. Venous: The inferior vena cava is normal in size with greater than 50% respiratory variability, suggesting right atrial pressure of 3 mmHg. IAS/Shunts: No atrial level shunt detected by color flow Doppler. Additional Comments: 3D imaging was not performed.  LEFT VENTRICLE PLAX 2D LVIDd:         2.80 cm LVIDs:         2.10 cm LV PW:         1.40 cm LV IVS:        1.20 cm LVOT diam:     1.70 cm LV SV:         33 LV SV Index:   21 LVOT Area:     2.27 cm  LV Volumes (MOD) LV vol d, MOD A2C: 56.9 ml LV vol d, MOD  A4C: 53.0 ml LV vol s, MOD A2C: 21.9 ml LV vol s, MOD A4C: 22.0 ml LV SV MOD A2C:     35.0 ml LV SV MOD A4C:     53.0 ml LV SV MOD BP:  32.7 ml RIGHT VENTRICLE             IVC RV S prime:     10.20 cm/s  IVC diam: 1.40 cm TAPSE (M-mode): 1.5 cm LEFT ATRIUM             Index        RIGHT ATRIUM           Index LA diam:        3.20 cm 2.05 cm/m   RA Area:     15.70 cm LA Vol (A2C):   41.1 ml 26.27 ml/m  RA Volume:   35.00 ml  22.37 ml/m LA Vol (A4C):   60.7 ml 38.80 ml/m LA Biplane Vol: 52.7 ml 33.69 ml/m  AORTIC VALVE LVOT Vmax:   101.00 cm/s LVOT Vmean:  63.600 cm/s LVOT VTI:    0.147 m AI PHT:      590 msec  AORTA Ao Root diam: 3.00 cm Ao Asc diam:  3.30 cm MITRAL VALVE               TRICUSPID VALVE MV Area (PHT): 6.54 cm    TR Peak grad:   31.6 mmHg MV Decel Time: 116 msec    TR Vmax:        281.00 cm/s MV E velocity: 96.80 cm/s                            SHUNTS                            Systemic VTI:  0.15 m                            Systemic Diam: 1.70 cm Arvilla Meres MD Electronically signed by Arvilla Meres MD Signature Date/Time: 01/22/2024/4:10:09 PM    Final    DG Abd 1 View Result Date: 01/22/2024 CLINICAL DATA:  Nausea and vomiting postop hip surgery. EXAM: ABDOMEN - 1 VIEW COMPARISON:  Left hip radiographs 01/18/2024. FINDINGS: 0848 hours. Two supine views of the abdomen are submitted. There is mild gastric and mild small bowel distension. There appears to be gas within the rectum. Pattern favors an ileus. No supine evidence of pneumoperitoneum. Postsurgical changes from recent proximal left femoral ORIF without demonstrated complication. IMPRESSION: Mild gastric and small bowel distension, favoring an ileus. Radiographic follow-up recommended. Electronically Signed   By: Carey Bullocks M.D.   On: 01/22/2024 12:41   DG HIP PORT UNILAT WITH PELVIS 1V LEFT Result Date: 01/18/2024 CLINICAL DATA:  Postoperative check of left hip. 1 AP view requested. EXAM: DG HIP (WITH OR WITHOUT  PELVIS) 1V PORT LEFT COMPARISON:  Left femur radiographs 01/17/2024 FINDINGS: Interval left cephalomedullary nail fixation of the previously seen intertrochanteric proximal left femoral fracture. Improved alignment. No hardware complication is seen. Expected postoperative lateral pelvis and proximal thigh subcutaneous air. IMPRESSION: Interval left cephalomedullary nail fixation of the previously seen intertrochanteric proximal left femoral fracture. Electronically Signed   By: Neita Garnet M.D.   On: 01/18/2024 15:16   DG C-Arm 1-60 Min-No Report Result Date: 01/18/2024 Fluoroscopy was utilized by the requesting physician.  No radiographic interpretation.   DG HIP UNILAT WITH PELVIS 2-3 VIEWS LEFT Result Date: 01/18/2024 CLINICAL DATA:  Surgical internal fixation of proximal left femoral fracture. EXAM: DG HIP (WITH OR WITHOUT PELVIS) 2-3V LEFT Radiation exposure index:  8.56 mGy. COMPARISON:  January 17, 2024. FINDINGS: Eight intraoperative fluoroscopic images were obtained of the proximal left femur. These demonstrate surgical internal fixation of proximal left femoral intertrochanteric fracture. IMPRESSION: Fluoroscopic guidance provided during surgical internal fixation of proximal left femoral intertrochanteric fracture. Electronically Signed   By: Lupita Raider M.D.   On: 01/18/2024 13:20   DG FEMUR 1V LEFT Result Date: 01/17/2024 CLINICAL DATA:  [id number]. Closed left hip fracture known present. Check for more distal femoral fracture. EXAM: LEFT FEMUR 1 VIEW COMPARISON:  AP pelvis and left hip dedicated views earlier today. FINDINGS: AP and lateral two views. There is redemonstration of an impacted, mildly cephalad displaced intertrochanteric proximal left femoral fracture with slight distal fragment adduction. There is osteopenia without further evidence of fractures. The left pelvic ring is intact. There is partially visible femorotibial degenerative arthrosis. Slight spurring of the acetabulum at  the hip. IMPRESSION: 1. Impacted, mildly cephalad displaced intertrochanteric proximal left femoral fracture with slight distal fragment adduction. 2. Osteopenia without further appreciable femoral fractures. 3. Degenerative changes. Electronically Signed   By: Almira Bar M.D.   On: 01/17/2024 20:15   CT HEAD WO CONTRAST Result Date: 01/17/2024 CLINICAL DATA:  Head trauma, moderate-severe; Polytrauma, blunt. Fall. EXAM: CT HEAD WITHOUT CONTRAST CT CERVICAL SPINE WITHOUT CONTRAST TECHNIQUE: Multidetector CT imaging of the head and cervical spine was performed following the standard protocol without intravenous contrast. Multiplanar CT image reconstructions of the cervical spine were also generated. RADIATION DOSE REDUCTION: This exam was performed according to the departmental dose-optimization program which includes automated exposure control, adjustment of the mA and/or kV according to patient size and/or use of iterative reconstruction technique. COMPARISON:  None Available. FINDINGS: CT HEAD FINDINGS Brain: No acute hemorrhage. Cortical gray-white differentiation is preserved. Patchy hypoattenuation of the cerebral white matter, most consistent with mild chronic small-vessel disease. Prominence of the ventricles and sulci within normal limits for age. No extra-axial collection. Basilar cisterns are patent. Vascular: No hyperdense vessel or unexpected calcification. Skull: No calvarial fracture or suspicious bone lesion. Skull base is unremarkable. Sinuses/Orbits: No acute finding. Other: None. CT CERVICAL SPINE FINDINGS Alignment: Degenerative reversal of the normal cervical lordosis. No traumatic malalignment. Skull base and vertebrae: No acute fracture. Normal craniocervical junction. No suspicious bone lesions. Multilevel severe disc height loss with degenerative endplate changes and sclerosis from C3-C7. Soft tissues and spinal canal: No prevertebral fluid or swelling. No visible canal hematoma. Disc  levels: Multilevel cervical spondylosis, worst at C5-6, where there is at least mild spinal canal stenosis. Upper chest: No acute findings. Other: None. IMPRESSION: 1. No acute intracranial abnormality. 2. No acute cervical spine fracture or traumatic malalignment. 3. Multilevel cervical spondylosis, worst at C5-6, where there is at least mild spinal canal stenosis. Electronically Signed   By: Orvan Falconer M.D.   On: 01/17/2024 18:19   CT CERVICAL SPINE WO CONTRAST Result Date: 01/17/2024 CLINICAL DATA:  Head trauma, moderate-severe; Polytrauma, blunt. Fall. EXAM: CT HEAD WITHOUT CONTRAST CT CERVICAL SPINE WITHOUT CONTRAST TECHNIQUE: Multidetector CT imaging of the head and cervical spine was performed following the standard protocol without intravenous contrast. Multiplanar CT image reconstructions of the cervical spine were also generated. RADIATION DOSE REDUCTION: This exam was performed according to the departmental dose-optimization program which includes automated exposure control, adjustment of the mA and/or kV according to patient size and/or use of iterative reconstruction technique. COMPARISON:  None Available. FINDINGS: CT HEAD FINDINGS Brain: No acute hemorrhage. Cortical gray-white differentiation is preserved. Patchy hypoattenuation of  the cerebral white matter, most consistent with mild chronic small-vessel disease. Prominence of the ventricles and sulci within normal limits for age. No extra-axial collection. Basilar cisterns are patent. Vascular: No hyperdense vessel or unexpected calcification. Skull: No calvarial fracture or suspicious bone lesion. Skull base is unremarkable. Sinuses/Orbits: No acute finding. Other: None. CT CERVICAL SPINE FINDINGS Alignment: Degenerative reversal of the normal cervical lordosis. No traumatic malalignment. Skull base and vertebrae: No acute fracture. Normal craniocervical junction. No suspicious bone lesions. Multilevel severe disc height loss with  degenerative endplate changes and sclerosis from C3-C7. Soft tissues and spinal canal: No prevertebral fluid or swelling. No visible canal hematoma. Disc levels: Multilevel cervical spondylosis, worst at C5-6, where there is at least mild spinal canal stenosis. Upper chest: No acute findings. Other: None. IMPRESSION: 1. No acute intracranial abnormality. 2. No acute cervical spine fracture or traumatic malalignment. 3. Multilevel cervical spondylosis, worst at C5-6, where there is at least mild spinal canal stenosis. Electronically Signed   By: Orvan Falconer M.D.   On: 01/17/2024 18:19   DG Chest Port 1 View Result Date: 01/17/2024 CLINICAL DATA:  Trauma EXAM: PORTABLE CHEST 1 VIEW COMPARISON:  05/04/2018 FINDINGS: Lung apices are incompletely included. There is cardiomegaly and aortic atherosclerosis. No consolidation, or pleural effusion. Chronic bronchitic changes IMPRESSION: No active disease allowing for incompletely included lung apices. Cardiomegaly. Electronically Signed   By: Jasmine Pang M.D.   On: 01/17/2024 18:09   DG Hip Unilat W or Wo Pelvis 2-3 Views Left Result Date: 01/17/2024 CLINICAL DATA:  Trauma left-sided hip pain EXAM: DG HIP (WITH OR WITHOUT PELVIS) 2-3V LEFT COMPARISON:  06/02/2010 FINDINGS: SI joints are non widened. Pubic symphysis and rami appear intact. Both femoral heads project in joint. Acute impacted basicervical/intertrochanteric fracture. Vascular calcifications IMPRESSION: Acute impacted left basicervical/intertrochanteric fracture. Electronically Signed   By: Jasmine Pang M.D.   On: 01/17/2024 18:08    Microbiology: Results for orders placed or performed in visit on 05/18/21  Fecal occult blood, imunochemical     Status: None   Collection Time: 05/18/21  8:16 AM   Specimen: Stool  Result Value Ref Range Status   Fecal Occult Bld Negative Negative Final    Labs: CBC: Recent Labs  Lab 01/18/24 0532 01/19/24 0517 01/20/24 0437 01/21/24 0607  01/22/24 0623 01/23/24 0644  WBC 9.8 12.1* 13.9* 11.5* 10.8* 9.4  NEUTROABS 7.0  --   --   --   --   --   HGB 12.1 10.9* 11.4* 10.8* 10.3* 10.0*  HCT 36.4 32.8* 34.5* 32.5* 30.8* 29.8*  MCV 93.8 94.0 94.3 93.7 93.1 93.7  PLT 180 164 159 198 262 273   Basic Metabolic Panel: Recent Labs  Lab 01/18/24 0532 01/19/24 0517 01/20/24 0437 01/21/24 0607 01/22/24 0623 01/23/24 0644  NA 139 136 136 134* 131* 134*  K 4.1 4.5 3.9 3.9 4.4 4.1  CL 99 98 96* 96* 93* 99  CO2 26 27 24 27 25 26   GLUCOSE 138* 128* 112* 105* 107* 91  BUN 13 11 19 20  25* 20  CREATININE 0.73 0.78 0.82 0.85 0.91 0.77  CALCIUM 9.7 9.0 9.2 9.0 8.9 8.2*  MG 2.1  2.0  --  1.9  --   --   --    Liver Function Tests: Recent Labs  Lab 01/19/24 0517 01/20/24 0437 01/21/24 0607 01/22/24 0623 01/23/24 0644  AST 23 24 21 17 22   ALT 13 11 9 10 9   ALKPHOS 40 41 41 41 39  BILITOT 0.8 1.3* 1.6* 1.8* 1.4*  PROT 6.9 6.7 6.7 6.7 6.0*  ALBUMIN 3.0* 2.9* 2.8* 2.6* 2.5*   CBG: No results for input(s): "GLUCAP" in the last 168 hours.  Discharge time spent: less than 30 minutes.  Signed: Rickey Barbara, MD [facility] 01/23/2024

## 2024-01-23 NOTE — Progress Notes (Signed)
 Attempted to call report to [facility], no answer.

## 2024-01-23 NOTE — TOC Progression Note (Addendum)
 Transition of Care [facility]) - Progression Note    Patient Details  Name: Evelyn [name] MRN: [id number] Date of Birth: August 12, 1930  Transition of Care [facility]) CM/SW Contact  Lorri Frederick, LCSW Phone Number: 01/23/2024, 10:01 AM  Clinical Narrative:   CSW confirmed with Nikki/[facility] that they can receive pt today.  CSW spoke with daughter Sedalia Muta and informed her.    1500: Pt has had BM, can DC to SNF.  Daughter Camelia Eng in room, CSW spoke with her about providing transport--she is able to do this.  Expected Discharge Plan: Skilled Nursing Facility Barriers to Discharge: Continued Medical Work up  Expected Discharge Plan and Services                                               Social Determinants of Health (SDOH) Interventions SDOH Screenings   Food Insecurity: No Food Insecurity (01/18/2024)  Housing: Low Risk  (01/19/2024)  Transportation Needs: No Transportation Needs (01/18/2024)  Utilities: Not At Risk (01/18/2024)  Alcohol Screen: Low Risk  (02/22/2023)  Depression (PHQ2-9): Low Risk  (02/22/2023)  Financial Resource Strain: Low Risk  (03/17/2023)  Physical Activity: Insufficiently Active (03/17/2023)  Social Connections: Moderately Integrated (01/19/2024)  Stress: No Stress Concern Present (09/21/2021)  Tobacco Use: Medium Risk (01/18/2024)    Readmission Risk Interventions     No data to display

## 2024-01-23 NOTE — Progress Notes (Signed)
 Mobility Specialist: Progress Note   01/23/24 1533  Mobility  Activity Transferred to/from [facility]  Level of Assistance Contact guard assist, steadying assist  Assistive Device Front wheel walker  Distance Ambulated (ft) 5 ft  LLE Weight Bearing Per Provider Order WBAT  Activity Response Tolerated well  Mobility Referral Yes  Mobility visit 1 Mobility  Mobility Specialist Start Time (ACUTE ONLY) 1500  Mobility Specialist Stop Time (ACUTE ONLY) 1514  Mobility Specialist Time Calculation (min) (ACUTE ONLY) 14 min    Pt requested to transfer from [facility] to bed. CG throughout. No complaints. Completed void and very small BM. Left in bed with all needs met, call bell in reach. RN in room.   Maurene Capes Mobility Specialist Please contact via SecureChat or Rehab office at [phone]

## 2024-01-23 NOTE — TOC Transition Note (Signed)
 Transition of Care [facility]) - Discharge Note   Patient Details  Name: Evelyn [name] MRN: [id number] Date of Birth: 08-Nov-1930  Transition of [facility]) CM/SW Contact:  Lorri Frederick, LCSW Phone Number: 01/23/2024, 3:12 PM   Clinical Narrative:   Pt discharging to [facility].  RN call report to [phone].  Pt daughter will provide transport.  Will need pt brought down to main north tower entrance with assistance getting into the vehicle.     Final next level of care: Skilled Nursing Facility Barriers to Discharge: Barriers Resolved   Patient Goals and CMS Choice     Choice offered to / list presented to : Patient      Discharge Placement              Patient chooses bed at: [facility] Patient to be transferred to facility by: daughter Evelyn [name] Name of family member notified: daughter Evelyn [name] Patient and family notified of of transfer: 01/23/24  Discharge Plan and Services Additional resources added to the After Visit Summary for                                       Social Drivers of Health (SDOH) Interventions SDOH Screenings   Food Insecurity: No Food Insecurity (01/18/2024)  Housing: Low Risk  (01/19/2024)  Transportation Needs: No Transportation Needs (01/18/2024)  Utilities: Not At Risk (01/18/2024)  Alcohol Screen: Low Risk  (02/22/2023)  Depression (PHQ2-9): Low Risk  (02/22/2023)  Financial Resource Strain: Low Risk  (03/17/2023)  Physical Activity: Insufficiently Active (03/17/2023)  Social Connections: Moderately Integrated (01/19/2024)  Stress: No Stress Concern Present (09/21/2021)  Tobacco Use: Medium Risk (01/18/2024)     Readmission Risk Interventions     No data to display

## 2024-01-23 NOTE — Evaluation (Signed)
 Occupational Therapy Evaluation Patient Details Name: Evelyn [name] MRN: [id number] DOB: 12-Jan-1930 Today's Date: 01/23/2024   History of Present Illness   Pt is a 89 y/o female who presents 01/17/2024 s/p fall, sustaining a L intertrochanteric femur fracture. She is now s/p L femur cephalomedullary nail on 01/18/2024. PMH significant for A-fib, HTN, osteoporosis.     Clinical Impressions Pt c/o 4/10 pain to low back, LLE at rest, manages well with activity. Pt min A to assist to EOB. Pt able to don/doff RLE socks, needs assist with LLE. Pt min A for STS with RW and increased effort, min A for ambulation around room 30 feet, reliant on RW for support. Pt assisted to recliner. Pt required verbal cueing for hand placement of RW and for STS pushing up from bed to maximize safety/stability. Pt would benefit from further acute OT to maximize functional independence, DC to postacute rehab still appropriate.      If plan is discharge home, recommend the following:   A lot of help with walking and/or transfers;A lot of help with bathing/dressing/bathroom;Two people to help with walking and/or transfers;Assistance with cooking/housework;Assist for transportation;Help with stairs or ramp for entrance     Functional Status Assessment         Equipment Recommendations   None recommended by OT;Other (comment)     Recommendations for Other Services         Precautions/Restrictions   Precautions Precautions: Fall Recall of Precautions/Restrictions: Intact Restrictions Weight Bearing Restrictions Per Provider Order: Yes LLE Weight Bearing Per Provider Order: Weight bearing as tolerated     Mobility Bed Mobility Overal bed mobility: Needs Assistance Bed Mobility: Supine to Sit     Supine to sit: Min assist, Used rails     General bed mobility comments: increased time, min A to assist LLE    Transfers Overall transfer level: Needs assistance Equipment used: Rolling walker  (2 wheels) Transfers: Sit to/from Stand, Bed to chair/wheelchair/BSC Sit to Stand: Min assist     Step pivot transfers: Min assist     General transfer comment: min A for STS and ambluation around room with RW      Balance Overall balance assessment: Needs assistance Sitting-balance support: Feet supported, No upper extremity supported Sitting balance-Leahy Scale: Fair     Standing balance support: Bilateral upper extremity supported, During functional activity, Reliant on assistive device for balance Standing balance-Leahy Scale: Poor Standing balance comment: reliant on RW for support                           ADL either performed or assessed with clinical judgement   ADL Overall ADL's : Needs assistance/impaired Eating/Feeding: Independent;Sitting               Upper Body Dressing : Sitting;Set up   Lower Body Dressing: Moderate assistance;Sitting/lateral leans   Toilet Transfer: Moderate assistance;Rolling walker (2 wheels)           Functional mobility during ADLs: Minimal assistance;Rolling walker (2 wheels) General ADL Comments: Pt mod A for LB ADLs, improving, min A/CGA for ambulation with RW 30 feet.     Vision         Perception         Praxis         Pertinent Vitals/Pain Pain Assessment Pain Assessment: 0-10 Pain Score: 5  Pain Location: L hip, low back Pain Descriptors / Indicators: Aching, Operative site guarding Pain Intervention(s): Monitored  during session     Extremity/Trunk Assessment Upper Extremity Assessment Upper Extremity Assessment: Overall WFL for tasks assessed           Communication Communication Communication: No apparent difficulties   Cognition Arousal: Alert Behavior During Therapy: WFL for tasks assessed/performed Cognition: No apparent impairments                               Following commands: Intact       Cueing  General Comments          Exercises      Shoulder Instructions      Home Living                                          Prior Functioning/Environment                      OT Problem List:     OT Treatment/Interventions:        OT Goals(Current goals can be found in the care plan section)   Acute Rehab OT Goals Patient Stated Goal: to improve strength OT Goal Formulation: With patient Time For Goal Achievement: 02/02/24 Potential to Achieve Goals: Good ADL Goals Pt Will Perform Lower Body Bathing: sitting/lateral leans;with modified independence Pt Will Perform Lower Body Dressing: with set-up;with adaptive equipment;sitting/lateral leans Pt Will Transfer to Toilet: bedside commode;stand pivot transfer;with contact guard assist Pt Will Perform Toileting - Clothing Manipulation and hygiene: with supervision;sitting/lateral leans Pt/caregiver will Perform Home Exercise Program: Increased strength;With Supervision;With written HEP provided;Both right and left upper extremity;With theraband   OT Frequency:  Min 1X/week    Co-evaluation              AM-PAC OT "6 Clicks" Daily Activity     Outcome Measure Help from another person eating meals?: None Help from another person taking care of personal grooming?: A Little Help from another person toileting, which includes using toliet, bedpan, or urinal?: A Lot Help from another person bathing (including washing, rinsing, drying)?: A Lot Help from another person to put on and taking off regular upper body clothing?: A Little Help from another person to put on and taking off regular lower body clothing?: A Lot 6 Click Score: 16   End of Session Equipment Utilized During Treatment: Gait belt;Rolling walker (2 wheels) Nurse Communication: Mobility status  Activity Tolerance: Patient tolerated treatment well Patient left: in chair;with call bell/phone within reach;with chair alarm set  OT Visit Diagnosis: Other abnormalities of gait and  mobility (R26.89);Unsteadiness on feet (R26.81);Pain;History of falling (Z91.81) Pain - Right/Left: Left Pain - part of body: Leg                Time: 1005-1034 OT Time Calculation (min): 29 min Charges:  OT General Charges $OT Visit: 1 Visit OT Treatments $Self Care/Home Management : 8-22 mins $Therapeutic Activity: 8-22 mins  [name], OTR/L   WALLY BEHAN 01/23/2024, 11:35 AM

## 2024-01-24 DIAGNOSIS — R269 Unspecified abnormalities of gait and mobility: Secondary | ICD-10-CM | POA: Diagnosis not present

## 2024-01-24 DIAGNOSIS — I10 Essential (primary) hypertension: Secondary | ICD-10-CM | POA: Diagnosis not present

## 2024-01-24 DIAGNOSIS — I4891 Unspecified atrial fibrillation: Secondary | ICD-10-CM | POA: Diagnosis not present

## 2024-01-24 DIAGNOSIS — M6281 Muscle weakness (generalized): Secondary | ICD-10-CM | POA: Diagnosis not present

## 2024-01-24 DIAGNOSIS — S72142D Displaced intertrochanteric fracture of left femur, subsequent encounter for closed fracture with routine healing: Secondary | ICD-10-CM | POA: Diagnosis not present

## 2024-01-25 DIAGNOSIS — R2681 Unsteadiness on feet: Secondary | ICD-10-CM | POA: Diagnosis not present

## 2024-01-25 DIAGNOSIS — I4819 Other persistent atrial fibrillation: Secondary | ICD-10-CM | POA: Diagnosis not present

## 2024-01-25 DIAGNOSIS — S72142D Displaced intertrochanteric fracture of left femur, subsequent encounter for closed fracture with routine healing: Secondary | ICD-10-CM | POA: Diagnosis not present

## 2024-01-25 DIAGNOSIS — Z9181 History of falling: Secondary | ICD-10-CM | POA: Diagnosis not present

## 2024-01-25 DIAGNOSIS — R2689 Other abnormalities of gait and mobility: Secondary | ICD-10-CM | POA: Diagnosis not present

## 2024-01-25 DIAGNOSIS — M6281 Muscle weakness (generalized): Secondary | ICD-10-CM | POA: Diagnosis not present

## 2024-01-29 DIAGNOSIS — Z9181 History of falling: Secondary | ICD-10-CM | POA: Diagnosis not present

## 2024-01-29 DIAGNOSIS — R2689 Other abnormalities of gait and mobility: Secondary | ICD-10-CM | POA: Diagnosis not present

## 2024-01-29 DIAGNOSIS — I4819 Other persistent atrial fibrillation: Secondary | ICD-10-CM | POA: Diagnosis not present

## 2024-01-29 DIAGNOSIS — R2681 Unsteadiness on feet: Secondary | ICD-10-CM | POA: Diagnosis not present

## 2024-01-29 DIAGNOSIS — S72142D Displaced intertrochanteric fracture of left femur, subsequent encounter for closed fracture with routine healing: Secondary | ICD-10-CM | POA: Diagnosis not present

## 2024-01-29 DIAGNOSIS — M6281 Muscle weakness (generalized): Secondary | ICD-10-CM | POA: Diagnosis not present

## 2024-01-31 ENCOUNTER — Other Ambulatory Visit: Payer: Self-pay | Admitting: *Deleted

## 2024-01-31 NOTE — Patient Outreach (Signed)
 Evelyn [name] resides in [facility].  Screening for potential complex care management services as benefit of health plan and primary care provider.  Collaboration meeting with [name] psychotherapist. Evelyn [name] reports transition plan is to return home alone. Has supportive daughter. Insurance has projected dc date for next week.   Went to speak with Evelyn [name]. However, Evelyn [name] was actively working with therapy in the hallway. Writer spoke with daughter Evelyn [name]. [name] reports Evelyn [name] lives alone. However she has 2 supportive daughters that do not live locally. Explained [facility] complex care management services. Evelyn [name] is agreeable. Writer provided contact information.Confirmed best contact number for Evelyn [name] as [phone].   Writer came back to room for second time to speak with Evelyn [name]. However, she was in the bathroom. Spoke with [name] again.   Will continue to follow. Will plan for referral upon SNF discharge.   [name], MSN, RN, BSN [place]  [facility], Healthy Communities RN Post- Acute Care Manager Direct Dial: [phone]

## 2024-02-01 DIAGNOSIS — Z9181 History of falling: Secondary | ICD-10-CM | POA: Diagnosis not present

## 2024-02-01 DIAGNOSIS — R2681 Unsteadiness on feet: Secondary | ICD-10-CM | POA: Diagnosis not present

## 2024-02-01 DIAGNOSIS — S72142D Displaced intertrochanteric fracture of left femur, subsequent encounter for closed fracture with routine healing: Secondary | ICD-10-CM | POA: Diagnosis not present

## 2024-02-01 DIAGNOSIS — R2689 Other abnormalities of gait and mobility: Secondary | ICD-10-CM | POA: Diagnosis not present

## 2024-02-01 DIAGNOSIS — I4819 Other persistent atrial fibrillation: Secondary | ICD-10-CM | POA: Diagnosis not present

## 2024-02-01 DIAGNOSIS — M6281 Muscle weakness (generalized): Secondary | ICD-10-CM | POA: Diagnosis not present

## 2024-02-05 DIAGNOSIS — I4819 Other persistent atrial fibrillation: Secondary | ICD-10-CM | POA: Diagnosis not present

## 2024-02-05 DIAGNOSIS — R2681 Unsteadiness on feet: Secondary | ICD-10-CM | POA: Diagnosis not present

## 2024-02-05 DIAGNOSIS — Z8781 Personal history of (healed) traumatic fracture: Secondary | ICD-10-CM | POA: Diagnosis not present

## 2024-02-05 DIAGNOSIS — M6281 Muscle weakness (generalized): Secondary | ICD-10-CM | POA: Diagnosis not present

## 2024-02-05 DIAGNOSIS — R2689 Other abnormalities of gait and mobility: Secondary | ICD-10-CM | POA: Diagnosis not present

## 2024-02-05 DIAGNOSIS — S72142D Displaced intertrochanteric fracture of left femur, subsequent encounter for closed fracture with routine healing: Secondary | ICD-10-CM | POA: Diagnosis not present

## 2024-02-05 DIAGNOSIS — Z9181 History of falling: Secondary | ICD-10-CM | POA: Diagnosis not present

## 2024-02-06 DIAGNOSIS — I10 Essential (primary) hypertension: Secondary | ICD-10-CM | POA: Diagnosis not present

## 2024-02-06 DIAGNOSIS — I4891 Unspecified atrial fibrillation: Secondary | ICD-10-CM | POA: Diagnosis not present

## 2024-02-07 ENCOUNTER — Other Ambulatory Visit: Payer: Self-pay | Admitting: *Deleted

## 2024-02-07 NOTE — Patient Outreach (Signed)
 Post- Acute Care Manager follow up. Mrs. Evelyn [name] resides in [facility]. Following for [facility] complex care management services as benefit of health plan and PCP.   Facility site visit to [facility]. Met with Mrs. Evelyn [name] at bedside. Explained complex care management services. Mrs. Evelyn [name] is agreeable. Confirms best contact number is [phone]. States she lives alone. Has supportive daughters who live at [place]. Writer previously spoke with daughter Evelyn [name] last week. Mrs. Evelyn [name] reports she is returning home on Friday.   Spoke with [name] psychotherapist. Confirms dc is planned for Friday. States she is working on home health agency arrangements.   Writer to follow up with Evelyn [name] regarding home health arrangements. Will refer to [facility] team upon SNF discharge.   Evelyn Noble, MSN, RN, BSN [place]  [facility], Healthy Communities RN Post- Acute Care Manager Direct Dial: [phone]

## 2024-02-08 ENCOUNTER — Telehealth: Payer: Self-pay | Admitting: Family Medicine

## 2024-02-08 DIAGNOSIS — M6281 Muscle weakness (generalized): Secondary | ICD-10-CM | POA: Diagnosis not present

## 2024-02-08 DIAGNOSIS — S72142D Displaced intertrochanteric fracture of left femur, subsequent encounter for closed fracture with routine healing: Secondary | ICD-10-CM | POA: Diagnosis not present

## 2024-02-08 DIAGNOSIS — R2689 Other abnormalities of gait and mobility: Secondary | ICD-10-CM | POA: Diagnosis not present

## 2024-02-08 DIAGNOSIS — R2681 Unsteadiness on feet: Secondary | ICD-10-CM | POA: Diagnosis not present

## 2024-02-08 DIAGNOSIS — I4819 Other persistent atrial fibrillation: Secondary | ICD-10-CM | POA: Diagnosis not present

## 2024-02-08 DIAGNOSIS — I4891 Unspecified atrial fibrillation: Secondary | ICD-10-CM | POA: Diagnosis not present

## 2024-02-08 DIAGNOSIS — Z9181 History of falling: Secondary | ICD-10-CM | POA: Diagnosis not present

## 2024-02-08 DIAGNOSIS — I10 Essential (primary) hypertension: Secondary | ICD-10-CM | POA: Diagnosis not present

## 2024-02-08 NOTE — Telephone Encounter (Signed)
 Copied from CRM [id number]. Topic: Appointments - Scheduling Inquiry for Clinic >> Feb 08, 2024  9:17 AM Randa Ngo wrote: Wilkie Aye from [facility] called to schedule a hospital follow-up for the patient. Patient was at [facility] 2/19 - 2/25 and then was at the rehab facility from 2/25 - 3/14. Cecille Po that a hospital follow-up would be within 14 days of discharge, but since patient was at rehab, it would be outside of the timeframe. Callback number for Wilkie Aye is [phone] to schedule and stated it is okay to leave a VM on her phone.

## 2024-02-08 NOTE — Telephone Encounter (Signed)
 Lvm to schedule

## 2024-02-12 ENCOUNTER — Telehealth: Payer: Self-pay

## 2024-02-12 ENCOUNTER — Other Ambulatory Visit: Payer: Self-pay | Admitting: *Deleted

## 2024-02-12 DIAGNOSIS — I10 Essential (primary) hypertension: Secondary | ICD-10-CM

## 2024-02-12 NOTE — Patient Outreach (Addendum)
 Verified in [facility] Mrs. Gusler discharged from [facility] on 02/09/24. Mrs. Asberry previously agreed to [facility] complex care management services as benefit of health plan and PCP. Please see writer's previous notes for details.   Confirmed with [facility] social worker. [facility] arranged for PT/OT services.  Will make referral to [facility] complex care management team.  Mrs. Billing has medical history of HTN, AFIB, s/p hip surgery.    [name], MSN, RN, BSN [place]  [facility], Healthy Communities RN Post- Acute Care Manager Direct Dial: [phone]

## 2024-02-13 ENCOUNTER — Ambulatory Visit: Payer: Self-pay | Admitting: Family Medicine

## 2024-02-13 NOTE — Telephone Encounter (Unsigned)
 Copied from CRM [id number]. Topic: Clinical - Home Health Verbal Orders >> Feb 13, 2024 10:53 AM Shelbie Proctor wrote: Caller/Agency: Gretch from [facility] [phone] verbal order approval to reschedule visit home health physical therapy visit to 02/16/24 Friday per patient request. Please advise and call back.

## 2024-02-13 NOTE — Telephone Encounter (Signed)
 Verbal given

## 2024-02-13 NOTE — Telephone Encounter (Signed)
 Copied from CRM [id number]. Topic: Clinical - Medication Question >> Feb 13, 2024  4:47 PM Albin Felling L wrote: Reason for CRM: Patient inquiring about a few medications, one being a blood pressure medication. Patient unsure of the name and the other being metoprolol tartrate.   Patient requesting a call back, [phone]

## 2024-02-13 NOTE — Telephone Encounter (Addendum)
 This RN attempted third and final attempt. LVM. Will route to office.   This RN attempted return call attempt #2. No answer. LVM.

## 2024-02-14 ENCOUNTER — Telehealth: Payer: Self-pay

## 2024-02-14 NOTE — Progress Notes (Signed)
 Complex Care Management Note  Care Guide Note 02/14/2024 Name: Bodhi Stenglein MRN: [id number] DOB: July 31, 1930  Marvia Pickles ELNORA QUIZON is a 89 y.o. year old female who sees [name], Grayling Congress, DO for primary care. I reached out to Garfield Cornea by phone today to offer complex care management services.  Ms. Deak was given information about Complex Care Management services today including:   The Complex Care Management services include support from the care team which includes your Nurse Care Manager, Clinical Social Worker, or Pharmacist.  The Complex Care Management team is here to help remove barriers to the health concerns and goals most important to you. Complex Care Management services are voluntary, and the patient may decline or stop services at any time by request to their care team member.   Complex Care Management Consent Status: Patient agreed to services and verbal consent obtained.   Follow up plan:  Telephone appointment with complex care management team member scheduled for:  02/19/2024  Encounter Outcome:  Patient Scheduled  Penne Lash , RMA     [place]  [facility], [facility] Guide  Direct Dial: [phone]  Website: [email or web address]

## 2024-02-16 DIAGNOSIS — H409 Unspecified glaucoma: Secondary | ICD-10-CM | POA: Diagnosis not present

## 2024-02-16 DIAGNOSIS — E785 Hyperlipidemia, unspecified: Secondary | ICD-10-CM | POA: Diagnosis not present

## 2024-02-16 DIAGNOSIS — I739 Peripheral vascular disease, unspecified: Secondary | ICD-10-CM | POA: Diagnosis not present

## 2024-02-16 DIAGNOSIS — E860 Dehydration: Secondary | ICD-10-CM | POA: Diagnosis not present

## 2024-02-16 DIAGNOSIS — D72829 Elevated white blood cell count, unspecified: Secondary | ICD-10-CM | POA: Diagnosis not present

## 2024-02-16 DIAGNOSIS — I4819 Other persistent atrial fibrillation: Secondary | ICD-10-CM | POA: Diagnosis not present

## 2024-02-16 DIAGNOSIS — M80052D Age-related osteoporosis with current pathological fracture, left femur, subsequent encounter for fracture with routine healing: Secondary | ICD-10-CM | POA: Diagnosis not present

## 2024-02-16 DIAGNOSIS — I10 Essential (primary) hypertension: Secondary | ICD-10-CM | POA: Diagnosis not present

## 2024-02-19 ENCOUNTER — Other Ambulatory Visit: Payer: Self-pay

## 2024-02-19 ENCOUNTER — Other Ambulatory Visit

## 2024-02-19 DIAGNOSIS — S72142A Displaced intertrochanteric fracture of left femur, initial encounter for closed fracture: Secondary | ICD-10-CM

## 2024-02-19 NOTE — Patient Outreach (Signed)
 Care Coordination   Initial Visit Note   02/19/2024 Name: Evelyn [name] MRN: [id number] DOB: 09-18-1930  Evelyn [name] is a 89 y.o. year old female who sees [name], Grayling Congress, DO for primary care. I spoke with  Garfield Cornea by phone today.  What matters to the patients health and wellness today?  Regaining mobility and independence after hip fracture and acute rehab stay.  Patient had questions about blood pressure medication. Amlodipine was discontinued during hospital stay, still taking metoprolol, although up until three days ago, she was only taking 50 mg once daily, now back to taking morning and night.  BP 139/87 as of today.  Taking all other medications as instructed.  Had Home Health intake 02/16/24, they recommend PT/OT 2x week, then 1x/week.  She is walking independently with a standard walker, has a rollator that she might start using if [facility] PT staff agree to that.  Has two daughters visiting regularly.  RN spoke to daughter Diane with patient consent and while on the phone, Diane asked about getting a Life Alert type device, and whether a home aide is covered by insurance.  Patient agreed to SW referral and order was completed for this.     Goals Addressed             This Visit's Progress    Patient Stated       Interventions Today    Flowsheet Row Most Recent Value  Chronic Disease   Chronic disease during today's visit Hypertension (HTN), Other  [S/P Left hip fracture, nailing 01/18/24, HTN medication changes]  General Interventions   General Interventions Discussed/Reviewed General Interventions Discussed, Durable Medical Equipment (DME), Doctor Visits, Community Resources  Doctor Visits Discussed/Reviewed PCP, Doctor Visits Discussed  [PCP visit 03/05/24 at 1:00]  Durable Medical Equipment (DME) BP Cuff, Walker, Shower bench  [patient has HH visit PT/OT will eval for home safety]  PCP/Specialist Visits Compliance with follow-up visit  Exercise Interventions   Exercise  Discussed/Reviewed Assistive device use and maintanence  [discussed use of standard walker vs rollator]  Education Interventions   Education Provided Provided Education  [discussed proper use of walker, discussed medication compliance]  Provided Verbal Education On When to see the doctor, Medication  [medication compliance, fall risk safety, LCSW referral for LifeAlert and need for home aide]  Mental Health Interventions   Mental Health Discussed/Reviewed Mental Health Discussed  [Patient reports no mental health concerns at this time]  Nutrition Interventions   Nutrition Discussed/Reviewed Nutrition Discussed  [Patient reports appetite increasing since being home. Daughters are providing meals while she is recuperating]  Pharmacy Interventions   Pharmacy Dicussed/Reviewed Pharmacy Topics Discussed, Medications and their functions, Medication Adherence  [patient was incorrectly taking metorprolol (once daily rather than twice daily) has since corrected this.  todays BP 139/87]  Medication Adherence Not taking medication  [Patient corrected dosage of metorprolol from once daily to instructed twice daily]  Safety Interventions   Safety Discussed/Reviewed Safety Discussed  [Patient has necessary DME in home to ensure safety]              SDOH assessments and interventions completed:  Yes  SDOH Interventions Today    Flowsheet Row Most Recent Value  SDOH Interventions   Food Insecurity Interventions Intervention Not Indicated  Housing Interventions Intervention Not Indicated  Transportation Interventions Intervention Not Indicated        Care Coordination Interventions:  Yes, provided   Follow up plan: Referral made to  BSW for assistance in obtaining LifeAlert and home aide.    Encounter Outcome:  Patient Visit Completed    Ruel Favors BSN RN CCM [place]  [facility], [facility] Health RN Care Manager Direct Dial: [phone] Fax: [phone]

## 2024-02-19 NOTE — Patient Instructions (Signed)
 Visit Information  Thank you for taking time to visit with me today. Please don't hesitate to contact me if I can be of assistance to you before our next scheduled telephone appointment.  Following are the goals we discussed today:  Continue to take medications as prescribed. Continue to attend provider visits as scheduled Contact provider with health questions or concerns as needed Continue to check blood pressure routinely and contact provider if questions or concerns  Our next appointment is by telephone on 02/26/24 at 2:00pm  Please call the care guide team at [phone] if you need to cancel or reschedule your appointment.   If you are experiencing a Mental Health or Behavioral Health Crisis or need someone to talk to, please call the Suicide and Crisis Lifeline: 988 call the [place] National Suicide Prevention Lifeline: (615)592-1270 or TTY: 417-597-9631 TTY 330 828 8306) to talk to a trained counselor call 1-800-273-TALK (toll free, 24 hour hotline)    Ruel Favors BSN RN CCM [place]  [facility], [facility] Health RN Care Manager Direct Dial: [phone] Fax: [phone]

## 2024-02-20 ENCOUNTER — Telehealth: Payer: Self-pay

## 2024-02-20 DIAGNOSIS — I4819 Other persistent atrial fibrillation: Secondary | ICD-10-CM | POA: Diagnosis not present

## 2024-02-20 DIAGNOSIS — I10 Essential (primary) hypertension: Secondary | ICD-10-CM | POA: Diagnosis not present

## 2024-02-20 DIAGNOSIS — D72829 Elevated white blood cell count, unspecified: Secondary | ICD-10-CM | POA: Diagnosis not present

## 2024-02-20 DIAGNOSIS — I739 Peripheral vascular disease, unspecified: Secondary | ICD-10-CM | POA: Diagnosis not present

## 2024-02-20 DIAGNOSIS — E785 Hyperlipidemia, unspecified: Secondary | ICD-10-CM | POA: Diagnosis not present

## 2024-02-20 DIAGNOSIS — H409 Unspecified glaucoma: Secondary | ICD-10-CM | POA: Diagnosis not present

## 2024-02-20 DIAGNOSIS — M80052D Age-related osteoporosis with current pathological fracture, left femur, subsequent encounter for fracture with routine healing: Secondary | ICD-10-CM | POA: Diagnosis not present

## 2024-02-20 DIAGNOSIS — E860 Dehydration: Secondary | ICD-10-CM | POA: Diagnosis not present

## 2024-02-20 NOTE — Telephone Encounter (Signed)
 Copied from CRM [id number]. Topic: Clinical - Medication Question >> Feb 20, 2024 10:52 AM Fredrich Romans wrote: Reason for CRM: Patient called in stating that she is having trouble resting and has pain in her left leg.She is wondering if a different medication could be prescribed to her besides the oxycodone.  Something  Less in strength than the oxycodone's.

## 2024-02-21 DIAGNOSIS — H409 Unspecified glaucoma: Secondary | ICD-10-CM | POA: Diagnosis not present

## 2024-02-21 DIAGNOSIS — D72829 Elevated white blood cell count, unspecified: Secondary | ICD-10-CM | POA: Diagnosis not present

## 2024-02-21 DIAGNOSIS — E785 Hyperlipidemia, unspecified: Secondary | ICD-10-CM | POA: Diagnosis not present

## 2024-02-21 DIAGNOSIS — I4819 Other persistent atrial fibrillation: Secondary | ICD-10-CM | POA: Diagnosis not present

## 2024-02-21 DIAGNOSIS — I10 Essential (primary) hypertension: Secondary | ICD-10-CM | POA: Diagnosis not present

## 2024-02-21 DIAGNOSIS — I739 Peripheral vascular disease, unspecified: Secondary | ICD-10-CM | POA: Diagnosis not present

## 2024-02-21 DIAGNOSIS — M80052D Age-related osteoporosis with current pathological fracture, left femur, subsequent encounter for fracture with routine healing: Secondary | ICD-10-CM | POA: Diagnosis not present

## 2024-02-21 DIAGNOSIS — E860 Dehydration: Secondary | ICD-10-CM | POA: Diagnosis not present

## 2024-02-22 ENCOUNTER — Telehealth: Payer: Self-pay

## 2024-02-23 DIAGNOSIS — D72829 Elevated white blood cell count, unspecified: Secondary | ICD-10-CM | POA: Diagnosis not present

## 2024-02-23 DIAGNOSIS — I739 Peripheral vascular disease, unspecified: Secondary | ICD-10-CM | POA: Diagnosis not present

## 2024-02-23 DIAGNOSIS — H409 Unspecified glaucoma: Secondary | ICD-10-CM | POA: Diagnosis not present

## 2024-02-23 DIAGNOSIS — E785 Hyperlipidemia, unspecified: Secondary | ICD-10-CM | POA: Diagnosis not present

## 2024-02-23 DIAGNOSIS — I4819 Other persistent atrial fibrillation: Secondary | ICD-10-CM | POA: Diagnosis not present

## 2024-02-23 DIAGNOSIS — I10 Essential (primary) hypertension: Secondary | ICD-10-CM | POA: Diagnosis not present

## 2024-02-23 DIAGNOSIS — E860 Dehydration: Secondary | ICD-10-CM | POA: Diagnosis not present

## 2024-02-23 DIAGNOSIS — M80052D Age-related osteoporosis with current pathological fracture, left femur, subsequent encounter for fracture with routine healing: Secondary | ICD-10-CM | POA: Diagnosis not present

## 2024-02-26 ENCOUNTER — Telehealth: Payer: Self-pay | Admitting: Family Medicine

## 2024-02-26 ENCOUNTER — Other Ambulatory Visit: Payer: Self-pay

## 2024-02-26 DIAGNOSIS — D72829 Elevated white blood cell count, unspecified: Secondary | ICD-10-CM | POA: Diagnosis not present

## 2024-02-26 DIAGNOSIS — I4819 Other persistent atrial fibrillation: Secondary | ICD-10-CM | POA: Diagnosis not present

## 2024-02-26 DIAGNOSIS — M80052D Age-related osteoporosis with current pathological fracture, left femur, subsequent encounter for fracture with routine healing: Secondary | ICD-10-CM | POA: Diagnosis not present

## 2024-02-26 DIAGNOSIS — E860 Dehydration: Secondary | ICD-10-CM | POA: Diagnosis not present

## 2024-02-26 DIAGNOSIS — I482 Chronic atrial fibrillation, unspecified: Secondary | ICD-10-CM

## 2024-02-26 DIAGNOSIS — E785 Hyperlipidemia, unspecified: Secondary | ICD-10-CM | POA: Diagnosis not present

## 2024-02-26 DIAGNOSIS — I10 Essential (primary) hypertension: Secondary | ICD-10-CM | POA: Diagnosis not present

## 2024-02-26 DIAGNOSIS — H409 Unspecified glaucoma: Secondary | ICD-10-CM | POA: Diagnosis not present

## 2024-02-26 DIAGNOSIS — I739 Peripheral vascular disease, unspecified: Secondary | ICD-10-CM | POA: Diagnosis not present

## 2024-02-26 NOTE — Telephone Encounter (Unsigned)
 Copied from CRM [id number]. Topic: Clinical - Medication Question >> Feb 26, 2024  4:50 PM Alessandra Bevels wrote: Reason for CRM: Patient is calling to report does she need to continue metoprolol tartrate (LOPRESSOR) 50 MG tablet [[id number]] ENDED or 100mg 

## 2024-02-26 NOTE — Addendum Note (Signed)
 Addended by: Ruel Favors on: 02/26/2024 02:59 PM   Modules accepted: Orders

## 2024-02-26 NOTE — Patient Instructions (Signed)
 Visit Information  Thank you for taking time to visit with me today. Please don't hesitate to contact me if I can be of assistance to you before our next scheduled telephone appointment.  Following are the goals we discussed today:   Please call your pharmacy to request refill of metorprolol Continue to take medications as prescribed. Continue to attend provider visits as scheduled Continue to eat healthy, lean meats, vegetables, fruits, avoid saturated and transfats Contact provider with health questions or concerns as needed Continue to check blood pressure routinely and contact provider if questions or concerns Our next appointment is by telephone on 03/06/24 at 2:45 pm  Please call the care guide team at [phone] if you need to cancel or reschedule your appointment.   If you are experiencing a Mental Health or Behavioral Health Crisis or need someone to talk to, please call the Suicide and Crisis Lifeline: 988 call the [place] National Suicide Prevention Lifeline: (559) 406-5042 or TTY: 254-468-7574 TTY (604)707-5141) to talk to a trained counselor call 1-800-273-TALK (toll free, 24 hour hotline)       Ruel Favors BSN RN CCM [place]  [facility], [facility] Health RN Care Manager Direct Dial: [phone] Fax: [phone]

## 2024-02-26 NOTE — Patient Outreach (Signed)
 Care Coordination   Follow Up Visit Note   02/26/2024 Name: Jaina Morin MRN: [id number] DOB: 10/04/1930  Marvia Pickles ISABELLY KOBLER is a 89 y.o. year old female who sees [name], Grayling Congress, DO for primary care. I spoke with  Garfield Cornea by phone today.  What matters to the patients health and wellness today?  Improving mobility after hip fracture. Medication for blood pressure and heart rate, patient is out of 50 mg metoprolol, which was ordered at hospital discharge, currently needing refill from PCP.  Following patient for chronic disease following discharge from skilled rehab.  Patient currently receiving home health PT/OT.      Goals Addressed             This Visit's Progress    Patient Stated       Interventions Today    Flowsheet Row Most Recent Value  Chronic Disease   Chronic disease during today's visit Hypertension (HTN), Other, Atrial Fibrillation (AFib)  [s/p hip fracture, d/c from SNF]  General Interventions   General Interventions Discussed/Reviewed General Interventions Reviewed, Doctor Visits, Communication with  Doctor Visits Discussed/Reviewed Doctor Visits Reviewed, PCP, Specialist  PCP/Specialist Visits Compliance with follow-up visit  Communication with PCP/Specialists  [sent message to PCP regarding needing refill for metoprolol]  Exercise Interventions   Exercise Discussed/Reviewed Physical Activity  [discussed home health PT, patient reports excellent improvement]  Physical Activity Discussed/Reviewed Home Exercise Program (HEP)  Education Interventions   Education Provided Provided Education  [provided verbal education re medication refills]  Provided Verbal Education On When to see the doctor, Medication  [instructed to call pharmacy to request refill of mediation]  Pharmacy Interventions   Pharmacy Dicussed/Reviewed Pharmacy Topics Reviewed, Medications and their functions, Referral to Pharmacist  Medication Adherence Unable to refill medication, Not taking  medication  [patient will call pharmacy to request refill of metoprolol]              SDOH assessments and interventions completed:  Yes  SDOH Interventions Today    Flowsheet Row Most Recent Value  SDOH Interventions   Utilities Interventions Intervention Not Indicated        Care Coordination Interventions:  Yes, provided   Follow up plan: Referral made to [facility] Clinical Pharmacist    Encounter Outcome:  Patient Visit Completed    Ruel Favors BSN RN CCM   [facility], [facility] Health RN Care Manager Direct Dial: [phone] Fax: [phone]

## 2024-02-27 ENCOUNTER — Telehealth: Payer: Self-pay | Admitting: Family Medicine

## 2024-02-27 ENCOUNTER — Other Ambulatory Visit: Payer: Self-pay | Admitting: Family Medicine

## 2024-02-27 ENCOUNTER — Telehealth: Payer: Self-pay

## 2024-02-27 MED ORDER — METOPROLOL TARTRATE 50 MG PO TABS: 50.0000 mg | ORAL_TABLET | Freq: Two times a day (BID) | ORAL | 0 refills | Status: DC

## 2024-02-27 NOTE — Telephone Encounter (Signed)
 Last refills was by ED provider. Please advise

## 2024-02-27 NOTE — Telephone Encounter (Signed)
 Copied from CRM [id number]. Topic: Clinical - Prescription Issue >> Feb 27, 2024 11:25 AM Randa Ngo wrote: Reason for CRM: Amy, RN with [employer] called about clarification for the patient's metoprolol tartrate (LOPRESSOR) 50 MG tablet medication. Patient was at a skilled nursing facility where they had her on 50 MG, but the previous order was for 100 MG. Patient is out of the 50 MG prescription, but does have more left of the 100 MG prescription. Patient wanting clarification on which dosage to take. Amy advised to callback patient at [phone] to clarify.

## 2024-02-27 NOTE — Progress Notes (Signed)
 Complex Care Management Note  Care Guide Note 02/27/2024 Name: Evelyn [name] MRN: [id number] DOB: 11/24/1930  Evelyn [name] Evelyn [name] is a 89 y.o. year old female who sees [name], Grayling Congress, DO for primary care. I reached out to Garfield Cornea by phone today to offer complex care management services.  Evelyn [name] was given information about Complex Care Management services today including:   The Complex Care Management services include support from the care team which includes your Nurse Care Manager, Clinical Social Worker, or Pharmacist.  The Complex Care Management team is here to help remove barriers to the health concerns and goals most important to you. Complex Care Management services are voluntary, and the patient may decline or stop services at any time by request to their care team member.   Complex Care Management Consent Status: Patient agreed to services and verbal consent obtained.   Follow up plan:  Telephone appointment with complex care management team member scheduled for:  02/28/24 at 4:00 p.m.   Encounter Outcome:  Patient Scheduled  [facility]  [facility], [facility] Care Management Assistant Direct Dial: [phone]  Fax: [phone]

## 2024-02-27 NOTE — Telephone Encounter (Signed)
 Copied from CRM [id number]. Topic: Clinical - Medication Question >> Feb 27, 2024 10:30 AM Saverio Danker wrote: Reason for CRM: Patient is calling to report  she is out of her blood pressure medication.  Should she continue her old medication

## 2024-02-28 ENCOUNTER — Ambulatory Visit (INDEPENDENT_AMBULATORY_CARE_PROVIDER_SITE_OTHER)

## 2024-02-28 DIAGNOSIS — I4821 Permanent atrial fibrillation: Secondary | ICD-10-CM

## 2024-02-28 DIAGNOSIS — I10 Essential (primary) hypertension: Secondary | ICD-10-CM

## 2024-02-28 DIAGNOSIS — E785 Hyperlipidemia, unspecified: Secondary | ICD-10-CM

## 2024-02-28 NOTE — Progress Notes (Signed)
 Pharmacy Note  02/28/2024 Name: Evelyn [name] MRN: [id number] DOB: Apr 12, 1930  Subjective: Evelyn [name] GURTHA PICKER is a 89 y.o. year old female who is a primary care patient of [name], Grayling Congress, DO. Clinical Pharmacist Practitioner referral was placed to assist with medication management.    Engaged with patient by telephone to follow up after she was release from rehab last week. She fractured her hip Feb 19th 2025. She was in the hospital for about 1 week and then in rehab for about 4 to 6 weeks.   Medication changes noted when she left the hospital 01/23/2024 Medications started at discharge: melatonin 3mg  at bedtime as needed,  metoprolol tartrate 50mg  twice a day,  ondansetron 4mg  every 8 hours as needed,  oxycodone 5mg  - take 0.5 tablet every 4 hours as needed for severe pain  Miralax 17 grams daily   Medications stopped at discharge: amlodipine 5mg  lantanoprostene bunod eye drop  metoprolol succinate 100mg  daily   Hypertension Office blood pressure has been at goal. Patient is not checking blood pressure at home.   BP Readings from Last 3 Encounters:  02/19/24 139/87  01/23/24 110/72  10/10/23 134/82    Hyperlipidemia: Statin intolerant. Taking Red Yeast Rice daily.   Afib: well controlled.  Anticoagulation: Eliquis 2.5mg  twice a day for stroke prevention  HR control - metoprolol tartrate 50mg  twice a day    SDOH (Social Determinants of Health) assessments and interventions performed:  SDOH Interventions    Flowsheet Row Patient Outreach from 02/26/2024 in [place] POPULATION HEALTH DEPARTMENT Patient Outreach from 02/19/2024 in [place] POPULATION HEALTH DEPARTMENT Office Visit from 03/17/2023 in [facility] at [facility] Clinical Support from 02/22/2023 in [facility] Primary Care at [facility] Chronic Care Management from 07/27/2022 in [facility] Primary Care at [facility] Chronic Care Management from 04/27/2022 in [facility] Primary Care at [facility]  SDOH Interventions        Food Insecurity Interventions -- Intervention Not Indicated -- Intervention Not Indicated -- --  Housing Interventions -- Intervention Not Indicated -- Intervention Not Indicated -- --  Transportation Interventions -- Intervention Not Indicated -- Intervention Not Indicated -- --  Utilities Interventions Intervention Not Indicated -- -- Intervention Not Indicated -- --  Alcohol Usage Interventions -- -- -- Intervention Not Indicated (Score <7) -- --  Financial Strain Interventions -- -- Intervention Not Indicated -- Patient Refused  [Patient is in Medicare Coverage gap but states cost of Eliquis is affordable,  declined PAP application] --  Physical Activity Interventions -- -- Other (Comments)  [patient to try to be more consistent with exercise.] -- -- Intervention Not Indicated  [discussed increasing frequency and length of exericse.]  Social Connections Interventions -- -- -- Intervention Not Indicated -- --        Objective: Review of patient status, including review of consultants reports, laboratory and other test data, was performed as part of comprehensive.  Lab Results  Component Value Date   CREATININE 0.77 01/23/2024   CREATININE 0.91 01/22/2024   CREATININE 0.85 01/21/2024    No results found for: "HGBA1C"     Component Value Date/Time   CHOL 206 (H) 09/05/2023 1057   TRIG 113.0 09/05/2023 1057   HDL 67.90 09/05/2023 1057   CHOLHDL 3 09/05/2023 1057   VLDL 22.6 09/05/2023 1057   LDLCALC 116 (H) 09/05/2023 1057   LDLDIRECT 130.5 03/26/2013 0916  Clinical ASCVD: No  The ASCVD Risk score (Arnett DK, et al., 2019) failed to calculate for the following reasons:   The 2019 ASCVD risk score is only valid for ages 39 to 85    BP Readings from Last 3 Encounters:  02/19/24 139/87  01/23/24 110/72  10/10/23 134/82     Allergies   Allergen Reactions   Vicodin [Hydrocodone-Acetaminophen] Other (See Comments)    Abdominal Pain   Pletaal [Cilostazol] Diarrhea   Statins     Medications Reviewed Today   Medications were not reviewed in this encounter     Patient Active Problem List   Diagnosis Date Noted   Closed displaced intertrochanteric fracture of left femur (HCC) 01/17/2024   Fall 01/17/2024   Leukocytosis 01/17/2024   Change in bowel function 02/01/2022   Hyperlipidemia 04/14/2020   Seasonal allergies 11/27/2019   Persistent atrial fibrillation (HCC) 03/26/2019   Anticoagulated 03/26/2019   Loose stools 04/14/2015   Osteoarthritis 04/14/2015   Chronic flank pain 06/17/2013   Plantar fasciitis 05/25/2013   PAD (peripheral artery disease) (HCC) 03/07/2013   Acute hip pain, left 02/05/2013   CARDIAC MURMUR 06/29/2010   VARICOSE VEINS, LOWER EXTREMITIES 06/02/2010   PLANTAR WART, LEFT 04/03/2009   Benign neoplasm of skin 04/03/2009   HIP PAIN, RIGHT 04/03/2009   Dyslipidemia, goal LDL below 70 06/29/2007   ARTIFICIAL MENOPAUSE 06/29/2007   Pain in limb 06/29/2007   Personal history presenting hazards to health 06/29/2007   Unspecified glaucoma 04/27/2007   Essential hypertension 04/27/2007   Osteoporosis 04/27/2007     Medication Assistance:  None required.  Patient affirms current coverage meets needs.   Assessment / Plan: Hypertension: Controlled Continue metoprolol tartrate 50mg  twice   Hyperlipidemia: Statin intolerant. Last LDL was improved Continue Red Yeast Rice daily.   Afib: well controlled.  Continue Eliquis 2.5 mg twice a day for stroke prevention and metoprolol for HR control.  (Dose of Eliquis has been adjusted for age > 80yo and weight < 60 kg)  Medication management:  Reviewed and updated medication list. Education provided about changes from hospital discharge - Removed oxycodone, ondansetron, melatonin and Miralax - no longer needed.  Explained about change from  metoprolol succinate 100mg  to tartrate 50mg  bid (patient has not received new dose yet - coordinated delivery for today. Added acetaminophen - patient is taking 500 to 650mg  1 or 2 times a day if needed for pain Reviewed refill history and adherence  Follow up in 1 month; patient to see PCP 03/05/2024 for physical  Henrene Pastor, PharmD Clinical Pharmacist [facility] Primary Care  - [facility] [phone]

## 2024-02-28 NOTE — Progress Notes (Signed)
 Complex Care Management Note  Care Guide Note 02/28/2024 Name: Sumaiyah Markert MRN: [id number] DOB: 28-Apr-1930  Marvia Pickles MARCOS PELOSO is a 89 y.o. year old female who sees [name], Grayling Congress, DO for primary care. I reached out to Garfield Cornea by phone today to offer complex care management services.  Ms. Khamis was given information about Complex Care Management services today including:   The Complex Care Management services include support from the care team which includes your Nurse Care Manager, Clinical Social Worker, or Pharmacist.  The Complex Care Management team is here to help remove barriers to the health concerns and goals most important to you. Complex Care Management services are voluntary, and the patient may decline or stop services at any time by request to their care team member.   Complex Care Management Consent Status: Patient did not agree to participate in complex care management services at this time.  Follow up plan:  Patient declined services at this time.   Encounter Outcome:  Patient Refused  Baruch Gouty [facility], [facility] Care Management Assistant Direct Dial: [phone]  Fax: [phone]

## 2024-02-28 NOTE — Telephone Encounter (Signed)
 PCP sent in refills of medication

## 2024-02-29 DIAGNOSIS — I739 Peripheral vascular disease, unspecified: Secondary | ICD-10-CM | POA: Diagnosis not present

## 2024-02-29 DIAGNOSIS — M80052D Age-related osteoporosis with current pathological fracture, left femur, subsequent encounter for fracture with routine healing: Secondary | ICD-10-CM | POA: Diagnosis not present

## 2024-02-29 DIAGNOSIS — I4819 Other persistent atrial fibrillation: Secondary | ICD-10-CM | POA: Diagnosis not present

## 2024-02-29 DIAGNOSIS — H409 Unspecified glaucoma: Secondary | ICD-10-CM | POA: Diagnosis not present

## 2024-02-29 DIAGNOSIS — E785 Hyperlipidemia, unspecified: Secondary | ICD-10-CM | POA: Diagnosis not present

## 2024-02-29 DIAGNOSIS — E860 Dehydration: Secondary | ICD-10-CM | POA: Diagnosis not present

## 2024-02-29 DIAGNOSIS — D72829 Elevated white blood cell count, unspecified: Secondary | ICD-10-CM | POA: Diagnosis not present

## 2024-02-29 DIAGNOSIS — I10 Essential (primary) hypertension: Secondary | ICD-10-CM | POA: Diagnosis not present

## 2024-03-05 ENCOUNTER — Encounter: Payer: Self-pay | Admitting: Family Medicine

## 2024-03-05 ENCOUNTER — Ambulatory Visit (INDEPENDENT_AMBULATORY_CARE_PROVIDER_SITE_OTHER): Payer: Medicare Other | Admitting: Family Medicine

## 2024-03-05 VITALS — BP 185/97 | HR 78 | Temp 97.5°F | Resp 16 | Ht 62.0 in | Wt 107.0 lb

## 2024-03-05 DIAGNOSIS — Z0000 Encounter for general adult medical examination without abnormal findings: Secondary | ICD-10-CM | POA: Diagnosis not present

## 2024-03-05 DIAGNOSIS — I739 Peripheral vascular disease, unspecified: Secondary | ICD-10-CM

## 2024-03-05 DIAGNOSIS — Z8781 Personal history of (healed) traumatic fracture: Secondary | ICD-10-CM

## 2024-03-05 DIAGNOSIS — E559 Vitamin D deficiency, unspecified: Secondary | ICD-10-CM

## 2024-03-05 DIAGNOSIS — E785 Hyperlipidemia, unspecified: Secondary | ICD-10-CM | POA: Diagnosis not present

## 2024-03-05 DIAGNOSIS — I4821 Permanent atrial fibrillation: Secondary | ICD-10-CM

## 2024-03-05 DIAGNOSIS — H9193 Unspecified hearing loss, bilateral: Secondary | ICD-10-CM

## 2024-03-05 DIAGNOSIS — I10 Essential (primary) hypertension: Secondary | ICD-10-CM | POA: Diagnosis not present

## 2024-03-05 MED ORDER — METOPROLOL TARTRATE 100 MG PO TABS: 100.0000 mg | ORAL_TABLET | Freq: Two times a day (BID) | ORAL | 3 refills | Status: DC

## 2024-03-05 NOTE — Progress Notes (Signed)
 Established Patient Office Visit  Subjective   Patient ID: Evelyn [name], female    DOB: [birth year]  Age: 89 y.o. MRN: [id number]  Chief Complaint  Patient presents with   Annual Exam         HPI Discussed the use of AI scribe software for clinical note transcription with the patient, who gave verbal consent to proceed.  History of Present Illness Evelyn [name] is a 89 year old female with hypertension who presents for a physical exam. She is accompanied by her daughter, Diane.  She has recently experienced elevated blood pressure, which was notably high during today's visit. Her blood pressure has been monitored by a therapist following a hip fracture, showing slight elevation but not as high as today. Her current medication includes metoprolol 50 mg twice daily, reduced from 100 mg, and she was previously on amlodipine, which was discontinued during rehabilitation.  She sustained a hip fracture after falling from a two-step aluminum ladder while rearranging items on a mantle, leading to hospitalization and surgery with pin and rod placement in her left hip. She is undergoing physical therapy, which she finds beneficial, and is hopeful to walk without a walker soon. She experiences some aching in her leg, particularly at night, affecting her sleep as she has to remain on her back. She uses Tylenol for pain management.  She has lost weight, currently weighing 107 pounds, attributing this to a lack of appetite during her hospital stay due to nausea from pain medications. Her appetite has improved since returning home, aided by her children's cooking and protein shakes.  She has a history of glaucoma and notes that her glaucoma drops were not administered during her hospital stay but resumed after her daughter brought them to her. She also reports a runny nose, which she attributes to pollen, and some difficulty hearing, particularly in noisy environments or when people whisper.  She has a  history of skin changes, including keratosis and skin tags, and plans to visit a dermatologist when transportation is available.  She discusses her living situation, considering moving to a place with more support due to her age and recent health issues.   Patient Active Problem List   Diagnosis Date Noted   Closed displaced intertrochanteric fracture of left femur (HCC) 01/17/2024   Fall 01/17/2024   Leukocytosis 01/17/2024   Change in bowel function 02/01/2022   Hyperlipidemia 04/14/2020   Seasonal allergies 11/27/2019   Persistent atrial fibrillation (HCC) 03/26/2019   Anticoagulated 03/26/2019   Loose stools 04/14/2015   Osteoarthritis 04/14/2015   Chronic flank pain 06/17/2013   Plantar fasciitis 05/25/2013   PAD (peripheral artery disease) (HCC) 03/07/2013   Acute hip pain, left 02/05/2013   CARDIAC MURMUR 06/29/2010   VARICOSE VEINS, LOWER EXTREMITIES 06/02/2010   PLANTAR WART, LEFT 04/03/2009   Benign neoplasm of skin 04/03/2009   HIP PAIN, RIGHT 04/03/2009   Dyslipidemia, goal LDL below 70 06/29/2007   ARTIFICIAL MENOPAUSE 06/29/2007   Pain in limb 06/29/2007   Personal history presenting hazards to health 06/29/2007   Unspecified glaucoma 04/27/2007   Essential hypertension 04/27/2007   Osteoporosis 04/27/2007   Past Medical History:  Diagnosis Date   Atrial fibrillation (HCC)    Hyperlipidemia    Hypertension    Osteoporosis    Post-menopausal    HRT   Past Surgical History:  Procedure Laterality Date   ABDOMINAL HYSTERECTOMY     BREAST LUMPECTOMY  11/28/1973   EYE SURGERY  Cataract removal   FEMUR IM NAIL Left 01/18/2024   Procedure: LEFT FEMUR CEPHALOMEDULLARY NAIL;  Surgeon: Marilee Showers, MD;  Location: [facility] OR;  Service: Orthopedics;  Laterality: Left;  Hana table, C-arm, Synthes   Social History   Tobacco Use   Smoking status: Former    Current packs/day: 0.00    Average packs/day: 0.8 packs/day for 18.0 years (13.5 ttl pk-yrs)    Types:  Cigarettes    Start date: 02/21/1955    Quit date: 02/20/1973    Years since quitting: 51.0   Smokeless tobacco: Never  Substance Use Topics   Alcohol use: Yes   Drug use: No   Social History   Socioeconomic History   Marital status: Widowed    Spouse name: Not on file   Number of children: Not on file   Years of education: Not on file   Highest education level: Not on file  Occupational History   Not on file  Tobacco Use   Smoking status: Former    Current packs/day: 0.00    Average packs/day: 0.8 packs/day for 18.0 years (13.5 ttl pk-yrs)    Types: Cigarettes    Start date: 02/21/1955    Quit date: 02/20/1973    Years since quitting: 51.0   Smokeless tobacco: Never  Substance and Sexual Activity   Alcohol use: Yes   Drug use: No   Sexual activity: Not Currently    Partners: Male  Other Topics Concern   Not on file  Social History Narrative   Exercise--yard work, house work   Social Drivers of Corporate investment banker Strain: Low Risk  (03/17/2023)   Overall Financial Resource Strain (CARDIA)    Difficulty of Paying Living Expenses: Not hard at all  Food Insecurity: No Food Insecurity (02/19/2024)   Hunger Vital Sign    Worried About Running Out of Food in the Last Year: Never true    Ran Out of Food in the Last Year: Never true  Transportation Needs: No Transportation Needs (02/19/2024)   PRAPARE - Administrator, Civil Service (Medical): No    Lack of Transportation (Non-Medical): No  Physical Activity: Insufficiently Active (03/17/2023)   Exercise Vital Sign    Days of Exercise per Week: 3 days    Minutes of Exercise per Session: 10 min  Stress: No Stress Concern Present (09/21/2021)   [employer] of Occupational Health - Occupational Stress Questionnaire    Feeling of Stress : Not at all  Social Connections: Moderately Integrated (01/19/2024)   Social Connection and Isolation Panel [NHANES]    Frequency of Communication with Friends and  Family: More than three times a week    Frequency of Social Gatherings with Friends and Family: Once a week    Attends Religious Services: More than 4 times per year    Active Member of [employer] or Organizations: Yes    Attends Banker Meetings: More than 4 times per year    Marital Status: Widowed  Intimate Partner Violence: Not At Risk (02/19/2024)   Humiliation, Afraid, Rape, and Kick questionnaire    Fear of Current or Ex-Partner: No    Emotionally Abused: No    Physically Abused: No    Sexually Abused: No   Family Status  Relation Name Status   Mother  Deceased at age 56   Father  Deceased at age 37   Sister  Alive   Brother  Deceased at age 72  lung cancer---nonsmoker   Sister  Alive  No partnership data on file   Family History  Problem Relation Age of Onset   Arthritis Mother    Heart disease Mother    Heart attack Father 37   Prostate cancer Father    COPD Father    Glaucoma Father    Hypertension Sister    COPD Sister    Arthritis Sister    Allergies  Allergen Reactions   Vicodin [Hydrocodone-Acetaminophen] Other (See Comments)    Abdominal Pain   Pletaal [Cilostazol] Diarrhea   Statins       ROS    Objective:     BP (!) 185/97 (BP Location: Left Arm, Patient Position: Sitting, Cuff Size: Small)   Pulse 78   Temp (!) 97.5 F (36.4 C) (Oral)   Resp 16   Ht 5\' 2"  (1.575 m)   Wt 107 lb (48.5 kg)   SpO2 98%   BMI 19.57 kg/m  BP Readings from Last 3 Encounters:  03/06/24 (!) 147/71  03/05/24 (!) 185/97  02/19/24 139/87   Wt Readings from Last 3 Encounters:  03/05/24 107 lb (48.5 kg)  01/21/24 124 lb 12.5 oz (56.6 kg)  10/10/23 113 lb (51.3 kg)   SpO2 Readings from Last 3 Encounters:  03/05/24 98%  01/23/24 90%  10/10/23 94%      Physical Exam   No results found for any visits on 03/05/24.  Last CBC Lab Results  Component Value Date   WBC 6.4 03/05/2024   HGB 13.4 03/05/2024   HCT 41.5 03/05/2024   MCV 95.5  03/05/2024   MCH 31.4 01/23/2024   RDW 16.1 (H) 03/05/2024   PLT 207.0 03/05/2024   Last metabolic panel Lab Results  Component Value Date   GLUCOSE 102 (H) 03/05/2024   NA 138 03/05/2024   K 4.3 03/05/2024   CL 100 03/05/2024   CO2 29 03/05/2024   BUN 15 03/05/2024   CREATININE 0.64 03/05/2024   GFR 76.85 03/05/2024   CALCIUM 9.7 03/05/2024   PROT 8.4 (H) 03/05/2024   ALBUMIN 4.4 03/05/2024   BILITOT 0.7 03/05/2024   ALKPHOS 90 03/05/2024   AST 24 03/05/2024   ALT 17 03/05/2024   ANIONGAP 9 01/23/2024   Last lipids Lab Results  Component Value Date   CHOL 177 03/05/2024   HDL 61.70 03/05/2024   LDLCALC 93 03/05/2024   LDLDIRECT 130.5 03/26/2013   TRIG 110.0 03/05/2024   CHOLHDL 3 03/05/2024   Last hemoglobin A1c No results found for: "HGBA1C" Last thyroid functions Lab Results  Component Value Date   TSH 2.09 03/05/2024   Last vitamin D Lab Results  Component Value Date   VD25OH 89.22 03/05/2024   Last vitamin B12 and Folate No results found for: "VITAMINB12", "FOLATE"    The ASCVD Risk score (Arnett DK, et al., 2019) failed to calculate for the following reasons:   The 2019 ASCVD risk score is only valid for ages 45 to 62    Assessment & Plan:   Problem List Items Addressed This Visit       Unprioritized   Dyslipidemia, goal LDL below 70   Relevant Medications   metoprolol tartrate (LOPRESSOR) 100 MG tablet   Other Relevant Orders   CBC with Differential/Platelet   Comprehensive metabolic panel with GFR   Lipid panel   TSH   PAD (peripheral artery disease) (HCC)   Relevant Medications   metoprolol tartrate (LOPRESSOR) 100 MG tablet   Other Relevant Orders  CBC with Differential/Platelet   Comprehensive metabolic panel with GFR   Lipid panel   TSH   Essential hypertension   Relevant Medications   metoprolol tartrate (LOPRESSOR) 100 MG tablet   Other Relevant Orders   CBC with Differential/Platelet   Comprehensive metabolic panel with  GFR   Lipid panel   TSH   Other Visit Diagnoses       Preventative health care    -  Primary     Permanent atrial fibrillation (HCC)       Relevant Medications   metoprolol tartrate (LOPRESSOR) 100 MG tablet   Other Relevant Orders   TSH     Vitamin D deficiency       Relevant Orders   VITAMIN D 25 Hydroxy (Vit-D Deficiency, Fractures)     S/p left hip fracture       Relevant Orders   AMB Referral VBCI Care Management     Bilateral hearing loss, unspecified hearing loss type       Relevant Orders   Ambulatory referral to Audiology     Assessment and Plan Assessment & Plan Hypertension   Her blood pressure is elevated both at home and in the clinic. She is currently on metoprolol 50 mg twice daily, having previously been on metoprolol 100 mg and amlodipine. Today's significant elevation may be due to medication changes during rehab and dietary changes post-rehab. Increasing metoprolol raises concerns about bradycardia, as her pulse is 78 bpm. Diuretics are not recommended due to potential dehydration and kidney function impact. Increase metoprolol to 100 mg twice daily and monitor pulse to ensure it remains above 60 bpm. Advise home blood pressure monitoring and ensure adequate hydration.  Hip Fracture   She recently sustained a left hip fracture treated with pin and rod by Dr. Marilee Showers. Physical therapy is ongoing with improved strength and mobility. She experiences intermittent leg aching, particularly at night, affecting sleep. Pain management with non-opioid options like Salonpas patches or Aspercreme with lidocaine was discussed. Continue physical therapy and consider these options for nighttime pain relief.  Glaucoma   Her glaucoma is managed with eye drops, which she missed during her hospital stay but resumed after discharge. Ensure continued use of glaucoma drops and reschedule her ophthalmology appointment.  Hearing Loss   She has difficulty hearing, especially in  noisy environments and when people whisper. Examination shows no earwax obstruction. She is open to obtaining a hearing aid if needed, despite cost concerns. Refer to an audiologist for a hearing evaluation.  Skin Lesions   She has keratosis and skin tags and is under the care of dermatologist Dr. Wauneta Haddock but has not visited recently due to transportation issues. Arrange transportation for her dermatology appointment.  General Health Maintenance   Discussed cessation of mammograms due to her age of 32 years, weighing the benefits and risks of mammograms in older age.  Follow-up   She needs transportation assistance for medical appointments due to mobility issues post-hip fracture. Discussed options for independent living facilities with varying levels of care. Arrange transportation through [employer] for medical appointments and contact her eye doctor and dermatologist for appointments.    Return in about 6 months (around 09/04/2024), or if symptoms worsen or fail to improve.    Zackerie Sara R Lowne Chase, DO

## 2024-03-05 NOTE — Patient Instructions (Signed)
 Preventive Care 43 Years and Older, Female Preventive care refers to lifestyle choices and visits with your health care provider that can promote health and wellness. Preventive care visits are also called wellness exams. What can I expect for my preventive care visit? Counseling Your health care provider may ask you questions about your: Medical history, including: Past medical problems. Family medical history. Pregnancy and menstrual history. History of falls. Current health, including: Memory and ability to understand (cognition). Emotional well-being. Home life and relationship well-being. Sexual activity and sexual health. Lifestyle, including: Alcohol, nicotine or tobacco, and drug use. Access to firearms. Diet, exercise, and sleep habits. Work and work Astronomer. Sunscreen use. Safety issues such as seatbelt and bike helmet use. Physical exam Your health care provider will check your: Height and weight. These may be used to calculate your BMI (body mass index). BMI is a measurement that tells if you are at a healthy weight. Waist circumference. This measures the distance around your waistline. This measurement also tells if you are at a healthy weight and may help predict your risk of certain diseases, such as type 2 diabetes and high blood pressure. Heart rate and blood pressure. Body temperature. Skin for abnormal spots. What immunizations do I need?  Vaccines are usually given at various ages, according to a schedule. Your health care provider will recommend vaccines for you based on your age, medical history, and lifestyle or other factors, such as travel or where you work. What tests do I need? Screening Your health care provider may recommend screening tests for certain conditions. This may include: Lipid and cholesterol levels. Hepatitis C test. Hepatitis B test. HIV (human immunodeficiency virus) test. STI (sexually transmitted infection) testing, if you are at  risk. Lung cancer screening. Colorectal cancer screening. Diabetes screening. This is done by checking your blood sugar (glucose) after you have not eaten for a while (fasting). Mammogram. Talk with your health care provider about how often you should have regular mammograms. BRCA-related cancer screening. This may be done if you have a family history of breast, ovarian, tubal, or peritoneal cancers. Bone density scan. This is done to screen for osteoporosis. Talk with your health care provider about your test results, treatment options, and if necessary, the need for more tests. Follow these instructions at home: Eating and drinking  Eat a diet that includes fresh fruits and vegetables, whole grains, lean protein, and low-fat dairy products. Limit your intake of foods with high amounts of sugar, saturated fats, and salt. Take vitamin and mineral supplements as recommended by your health care provider. Do not drink alcohol if your health care provider tells you not to drink. If you drink alcohol: Limit how much you have to 0-1 drink a day. Know how much alcohol is in your drink. In the [place] one drink equals one 12 oz bottle of beer (355 mL), one 5 oz glass of wine (148 mL), or one 1 oz glass of hard liquor (44 mL). Lifestyle Brush your teeth every morning and night with fluoride toothpaste. Floss one time each day. Exercise for at least 30 minutes 5 or more days each week. Do not use any products that contain nicotine or tobacco. These products include cigarettes, chewing tobacco, and vaping devices, such as e-cigarettes. If you need help quitting, ask your health care provider. Do not use drugs. If you are sexually active, practice safe sex. Use a condom or other form of protection in order to prevent STIs. Take aspirin only as told by  your health care provider. Make sure that you understand how much to take and what form to take. Work with your health care provider to find out whether it  is safe and beneficial for you to take aspirin daily. Ask your health care provider if you need to take a cholesterol-lowering medicine (statin). Find healthy ways to manage stress, such as: Meditation, yoga, or listening to music. Journaling. Talking to a trusted person. Spending time with friends and family. Minimize exposure to UV radiation to reduce your risk of skin cancer. Safety Always wear your seat belt while driving or riding in a vehicle. Do not drive: If you have been drinking alcohol. Do not ride with someone who has been drinking. When you are tired or distracted. While texting. If you have been using any mind-altering substances or drugs. Wear a helmet and other protective equipment during sports activities. If you have firearms in your house, make sure you follow all gun safety procedures. What's next? Visit your health care provider once a year for an annual wellness visit. Ask your health care provider how often you should have your eyes and teeth checked. Stay up to date on all vaccines. This information is not intended to replace advice given to you by your health care provider. Make sure you discuss any questions you have with your health care provider. Document Revised: 05/12/2021 Document Reviewed: 05/12/2021 Elsevier Patient Education  2024 [employer].

## 2024-03-06 ENCOUNTER — Telehealth: Payer: Self-pay

## 2024-03-06 ENCOUNTER — Other Ambulatory Visit: Payer: Self-pay

## 2024-03-06 LAB — CBC WITH DIFFERENTIAL/PLATELET
Basophils Absolute: 0.1 10*3/uL (ref 0.0–0.1)
Basophils Relative: 1.1 % (ref 0.0–3.0)
Eosinophils Absolute: 0.1 10*3/uL (ref 0.0–0.7)
Eosinophils Relative: 2.1 % (ref 0.0–5.0)
HCT: 41.5 % (ref 36.0–46.0)
Hemoglobin: 13.4 g/dL (ref 12.0–15.0)
Lymphocytes Relative: 35.3 % (ref 12.0–46.0)
Lymphs Abs: 2.3 10*3/uL (ref 0.7–4.0)
MCHC: 32.4 g/dL (ref 30.0–36.0)
MCV: 95.5 fl (ref 78.0–100.0)
Monocytes Absolute: 0.8 10*3/uL (ref 0.1–1.0)
Monocytes Relative: 12.4 % — ABNORMAL HIGH (ref 3.0–12.0)
Neutro Abs: 3.2 10*3/uL (ref 1.4–7.7)
Neutrophils Relative %: 49.1 % (ref 43.0–77.0)
Platelets: 207 10*3/uL (ref 150.0–400.0)
RBC: 4.35 Mil/uL (ref 3.87–5.11)
RDW: 16.1 % — ABNORMAL HIGH (ref 11.5–15.5)
WBC: 6.4 10*3/uL (ref 4.0–10.5)

## 2024-03-06 LAB — COMPREHENSIVE METABOLIC PANEL WITH GFR
ALT: 17 U/L (ref 0–35)
AST: 24 U/L (ref 0–37)
Albumin: 4.4 g/dL (ref 3.5–5.2)
Alkaline Phosphatase: 90 U/L (ref 39–117)
BUN: 15 mg/dL (ref 6–23)
CO2: 29 meq/L (ref 19–32)
Calcium: 9.7 mg/dL (ref 8.4–10.5)
Chloride: 100 meq/L (ref 96–112)
Creatinine, Ser: 0.64 mg/dL (ref 0.40–1.20)
GFR: 76.85 mL/min (ref >60.00)
Glucose, Bld: 102 mg/dL — ABNORMAL HIGH (ref 70–99)
Potassium: 4.3 meq/L (ref 3.5–5.1)
Sodium: 138 meq/L (ref 135–145)
Total Bilirubin: 0.7 mg/dL (ref 0.2–1.2)
Total Protein: 8.4 g/dL — ABNORMAL HIGH (ref 6.0–8.3)

## 2024-03-06 LAB — VITAMIN D 25 HYDROXY (VIT D DEFICIENCY, FRACTURES): VITD: 89.22 ng/mL (ref 30.00–100.00)

## 2024-03-06 LAB — LIPID PANEL
Cholesterol: 177 mg/dL (ref 0–200)
HDL: 61.7 mg/dL (ref >39.00)
LDL Cholesterol: 93 mg/dL (ref 0–99)
NonHDL: 115.42
Total CHOL/HDL Ratio: 3
Triglycerides: 110 mg/dL (ref 0.0–149.0)
VLDL: 22 mg/dL (ref 0.0–40.0)

## 2024-03-06 LAB — TSH: TSH: 2.09 u[IU]/mL (ref 0.35–5.50)

## 2024-03-06 NOTE — Telephone Encounter (Signed)
 Copied from CRM [id number]. Topic: Clinical - Medication Question >> Mar 06, 2024  3:05 PM Deaijah H wrote: Reason for CRM: Misty Stanley Nurse case manager called in stating patient in is under impression that she's supposed to take metoprolol tartrate (LOPRESSOR) 100 MG tablet once a day and would like to have someone confirm the correct instruction. Please call [phone] (patient)

## 2024-03-06 NOTE — Telephone Encounter (Signed)
 Pt's med list has her taking the medication twice daily. Please advise if it is supposed to be once or twice daily.

## 2024-03-07 ENCOUNTER — Telehealth: Payer: Self-pay

## 2024-03-07 DIAGNOSIS — E860 Dehydration: Secondary | ICD-10-CM | POA: Diagnosis not present

## 2024-03-07 DIAGNOSIS — I4819 Other persistent atrial fibrillation: Secondary | ICD-10-CM | POA: Diagnosis not present

## 2024-03-07 DIAGNOSIS — I739 Peripheral vascular disease, unspecified: Secondary | ICD-10-CM | POA: Diagnosis not present

## 2024-03-07 DIAGNOSIS — I10 Essential (primary) hypertension: Secondary | ICD-10-CM | POA: Diagnosis not present

## 2024-03-07 DIAGNOSIS — H409 Unspecified glaucoma: Secondary | ICD-10-CM | POA: Diagnosis not present

## 2024-03-07 DIAGNOSIS — E785 Hyperlipidemia, unspecified: Secondary | ICD-10-CM | POA: Diagnosis not present

## 2024-03-07 DIAGNOSIS — M80052D Age-related osteoporosis with current pathological fracture, left femur, subsequent encounter for fracture with routine healing: Secondary | ICD-10-CM | POA: Diagnosis not present

## 2024-03-07 DIAGNOSIS — D72829 Elevated white blood cell count, unspecified: Secondary | ICD-10-CM | POA: Diagnosis not present

## 2024-03-07 NOTE — Patient Outreach (Signed)
 Complex Care Management   Visit Note  03/07/2024  Name:  Evelyn [name] MRN: [id number] DOB: 1930-08-15  Situation: Referral received for Complex Care Management related to  HTN   I obtained verbal consent from patient.  Visit completed with patient  on the phone  Background:   Past Medical History:  Diagnosis Date   Atrial fibrillation (HCC)    Hyperlipidemia    Hypertension    Osteoporosis    Post-menopausal    HRT    Assessment: Patient Reported Symptoms:  Cognitive Alert and oriented to person, place, and time  Neurological No symptoms reported    HEENT Change or loss of hearing    Cardiovascular No symptoms reported    Respiratory No symptoms reported    Endocrine No symptoms reported    Gastrointestinal No symptoms reported    Genitourinary No symptoms reported    Integumentary Other patient requested derm referral for moles  RNCM sent requets to PCP  Musculoskeletal No symptoms reported, Difficulty walking    Psychosocial       Vitals:   03/06/24 1514  BP: (!) 147/71    Medications Reviewed Today     Reviewed by Ruel Favors, RN (Registered Nurse) on 03/06/24 at 1516  Med List Status: <None>   Medication Order Taking? Sig Documenting Provider Last Dose Status Informant  acetaminophen (QC ACETAMINOPHEN 8 HOURS) 650 MG CR tablet [id number] Yes Take 650 mg by mouth every 8 (eight) hours as needed for pain. [provider] Taking Active   ascorbic acid (VITAMIN C) 500 MG tablet [id number] Yes Take 500 mg by mouth daily. [provider] Taking Active Self, Pharmacy Records  calcium carbonate (OS-CAL) 600 MG TABS [id number] Yes Take 600 mg by mouth daily. [provider] Taking Active Self, Pharmacy Records  Cholecalciferol (VITAMIN D PO) [id number] Yes Take 1 capsule by mouth daily. 2000 units [provider] Taking Active Self, Pharmacy Records  ELIQUIS 2.5 MG TABS tablet [id number] Yes TAKE 1 TABLET BY MOUTH TWICE DAILY Iran Ouch, MD Taking Active Self, Pharmacy Records  glucosamine-chondroitin 500-400 MG tablet [id number] Yes Take 1 tablet by mouth daily.  [provider] Taking Active Self, Pharmacy Records  Latanoprostene Bunod [place] OP) [id number] Yes Apply 1 drop to eye once. One drop in each eye per day for glaucoma [provider] Taking Active Self  metoprolol tartrate (LOPRESSOR) 100 MG tablet [id number] Yes Take 1 tablet (100 mg total) by mouth 2 (two) times daily. Donato Schultz, DO Taking Active            Med Note Julian Reil, Lenix Kidd   Wed Mar 06, 2024  3:16 PM) Patient confused regarding dosage.  Reports that she understood Dr. Riley Churches to 100 mg tablet one time per day.  Order still reads 100 mg tablet 2x/day.  Called providers office and left message for clarification.  Red Yeast Rice 600 MG TABS [id number] Yes Take 1 tablet by mouth daily. [provider] Taking Active Self, Pharmacy Records  timolol (TIMOPTIC) 0.5 % ophthalmic solution [id number] Yes 1 drop every morning. [provider] Taking Active Self, Pharmacy Records            Recommendation:   Specialty provider follow-up Patient given contact information for [facility] to schedule visit.  Referral to Dermatology (Dr. June Leap) requested.   Follow Up Plan:   Telephone follow up appointment date/time:  03/22/24 at 3:00 Request sent to PCP to place dermatology referral.  Ruel Favors BSN RN CCM [place]  [facility], [facility] RN Care Manager Direct Dial: [phone] Fax: [phone]

## 2024-03-07 NOTE — Patient Instructions (Signed)
 Visit Information  Thank you for taking time to visit with me today. Please don't hesitate to contact me if I can be of assistance to you before our next scheduled appointment.  Our next appointment is by telephone on 03/22/24 at 3:00 Please call the care guide team at [phone] if you need to cancel or reschedule your appointment.   Following is a copy of your care plan:   Goals Addressed             This Visit's Progress    03/22/24       Problems:  Chronic Disease Management support and education needs related to HTN  Goal: Over the next 30  days the Patient will attend all scheduled medical appointments: bloo as evidenced by chart review        demonstrate Improved adherence to prescribed treatment plan for HTN as evidenced by blood pressure readings within normal limits of 130/80 , taking all medication as prescribed and attending all scheduled medical appointments.    Interventions:   Hypertension Interventions:  (Status:  Goal on track:  NO.) Short Term Goal Last practice recorded BP readings:  BP Readings from Last 3 Encounters:  03/06/24 (!) 147/71  03/05/24 (!) 185/97  02/19/24 139/87   Most recent eGFR/CrCl: No results found for: "EGFR"  No components found for: "CRCL"  Evaluation of current treatment plan related to hypertension self management and patient's adherence to plan as established by provider Provided education to patient re: stroke prevention, s/s of heart attack and stroke Reviewed medications with patient and discussed importance of compliance Counseled on the importance of exercise goals with target of 150 minutes per week  Patient Self-Care Activities:  Attend all scheduled provider appointments Call pharmacy for medication refills 3-7 days in advance of running out of medications Call provider office for new concerns or questions  Take medications as prescribed   Call [facility] to schedule exam at [phone]  Plan:  Telephone follow  up appointment with care management team member scheduled for:  03/22/24 at 3:00  Request to PCP to place referral to Dermatology (Dr June Leap) for skin check             Please call the Suicide and Crisis Lifeline: 988 call the [place] National Suicide Prevention Lifeline: 3342985315 or TTY: 450-273-8785 TTY 907-031-7058) to talk to a trained counselor call 1-800-273-TALK (toll free, 24 hour hotline) if you are experiencing a Mental Health or Behavioral Health Crisis or need someone to talk to.  Patient verbalizes understanding of instructions and care plan provided today and agrees to view in MyChart. Active MyChart status and patient understanding of how to access instructions and care plan via MyChart confirmed with patient.      Ruel Favors BSN RN CCM [place]  [facility], [facility] RN Care Manager Direct Dial: [phone] Fax: [phone]

## 2024-03-07 NOTE — Telephone Encounter (Signed)
 Copied from CRM [id number]. Topic: Clinical - Medication Question >> Mar 06, 2024  3:05 PM Deaijah H wrote: Reason for CRM: Misty Stanley Nurse case manager called in stating patient in is under impression that she's supposed to take metoprolol tartrate (LOPRESSOR) 100 MG tablet once a day and would like to have someone confirm the correct instruction. Please call [phone] (patient) >> Mar 07, 2024  2:33 PM Marica Otter wrote: Misty Stanley case manager calling to follow up on message sent yesterday regarding patients metoprolol tartrate (LOPRESSOR) 100 MG tablet, patient is confused on dosage.Please reach out to patient, patient is upset about the confusion  Marvia Pickles [phone] (H)  [phone]

## 2024-03-08 DIAGNOSIS — I739 Peripheral vascular disease, unspecified: Secondary | ICD-10-CM | POA: Diagnosis not present

## 2024-03-08 DIAGNOSIS — D72829 Elevated white blood cell count, unspecified: Secondary | ICD-10-CM | POA: Diagnosis not present

## 2024-03-08 DIAGNOSIS — E860 Dehydration: Secondary | ICD-10-CM | POA: Diagnosis not present

## 2024-03-08 DIAGNOSIS — I4819 Other persistent atrial fibrillation: Secondary | ICD-10-CM | POA: Diagnosis not present

## 2024-03-08 DIAGNOSIS — I10 Essential (primary) hypertension: Secondary | ICD-10-CM | POA: Diagnosis not present

## 2024-03-08 DIAGNOSIS — E785 Hyperlipidemia, unspecified: Secondary | ICD-10-CM | POA: Diagnosis not present

## 2024-03-08 DIAGNOSIS — M80052D Age-related osteoporosis with current pathological fracture, left femur, subsequent encounter for fracture with routine healing: Secondary | ICD-10-CM | POA: Diagnosis not present

## 2024-03-08 DIAGNOSIS — H409 Unspecified glaucoma: Secondary | ICD-10-CM | POA: Diagnosis not present

## 2024-03-11 ENCOUNTER — Telehealth: Payer: Self-pay

## 2024-03-11 NOTE — Progress Notes (Signed)
   Telephone encounter was:  Unsuccessful.  03/11/2024 Name: Jamyra Zweig MRN: [id number] DOB: 10-16-1930  Unsuccessful outbound call made today to assist with:  Transportation Needs   Outreach Attempt:  1st Attempt  A HIPAA compliant voice message was left requesting a return call.  Instructed patient to call back   Azell Leopard [place] [facility] Guide, Phone: [phone] Fax: [phone] Website: [email or web address]

## 2024-03-11 NOTE — Telephone Encounter (Signed)
 Pt calling in again requesting clarification on instructions. Patient states she has been waiting for reply since last week. Best call back number: [phone]

## 2024-03-12 DIAGNOSIS — E785 Hyperlipidemia, unspecified: Secondary | ICD-10-CM | POA: Diagnosis not present

## 2024-03-12 DIAGNOSIS — H409 Unspecified glaucoma: Secondary | ICD-10-CM | POA: Diagnosis not present

## 2024-03-12 DIAGNOSIS — E860 Dehydration: Secondary | ICD-10-CM | POA: Diagnosis not present

## 2024-03-12 DIAGNOSIS — I739 Peripheral vascular disease, unspecified: Secondary | ICD-10-CM | POA: Diagnosis not present

## 2024-03-12 DIAGNOSIS — I4819 Other persistent atrial fibrillation: Secondary | ICD-10-CM | POA: Diagnosis not present

## 2024-03-12 DIAGNOSIS — I10 Essential (primary) hypertension: Secondary | ICD-10-CM | POA: Diagnosis not present

## 2024-03-12 DIAGNOSIS — D72829 Elevated white blood cell count, unspecified: Secondary | ICD-10-CM | POA: Diagnosis not present

## 2024-03-12 DIAGNOSIS — M80052D Age-related osteoporosis with current pathological fracture, left femur, subsequent encounter for fracture with routine healing: Secondary | ICD-10-CM | POA: Diagnosis not present

## 2024-03-12 NOTE — Telephone Encounter (Signed)
 Tommy from [facility] called and stated that the patient reached out to them regarding her medication instructions because she hasn't gotten a response yet. He asked that someone calls her as soon as possible. Please advise.

## 2024-03-13 ENCOUNTER — Telehealth: Payer: Self-pay

## 2024-03-13 NOTE — Telephone Encounter (Signed)
 Pt called. LVM advising that medication is to be taken twice a day.

## 2024-03-13 NOTE — Progress Notes (Signed)
   Telephone encounter was:  Unsuccessful.  03/13/2024 Name: Estefanny Moler MRN: [id number] DOB: 1930-07-05  Unsuccessful outbound call made today to assist with:  Transportation Needs   Outreach Attempt:  2nd Attempt  A HIPAA compliant voice message was left requesting a return call.  Instructed patient to call back   Azell Leopard [facility] Guide, Phone: [phone] Fax: [phone] Website: [email or web address]

## 2024-03-14 ENCOUNTER — Telehealth: Payer: Self-pay

## 2024-03-14 DIAGNOSIS — I10 Essential (primary) hypertension: Secondary | ICD-10-CM | POA: Diagnosis not present

## 2024-03-14 DIAGNOSIS — D72829 Elevated white blood cell count, unspecified: Secondary | ICD-10-CM | POA: Diagnosis not present

## 2024-03-14 DIAGNOSIS — I4819 Other persistent atrial fibrillation: Secondary | ICD-10-CM | POA: Diagnosis not present

## 2024-03-14 DIAGNOSIS — E860 Dehydration: Secondary | ICD-10-CM | POA: Diagnosis not present

## 2024-03-14 DIAGNOSIS — H409 Unspecified glaucoma: Secondary | ICD-10-CM | POA: Diagnosis not present

## 2024-03-14 DIAGNOSIS — I739 Peripheral vascular disease, unspecified: Secondary | ICD-10-CM | POA: Diagnosis not present

## 2024-03-14 DIAGNOSIS — E785 Hyperlipidemia, unspecified: Secondary | ICD-10-CM | POA: Diagnosis not present

## 2024-03-14 DIAGNOSIS — M80052D Age-related osteoporosis with current pathological fracture, left femur, subsequent encounter for fracture with routine healing: Secondary | ICD-10-CM | POA: Diagnosis not present

## 2024-03-14 NOTE — Progress Notes (Signed)
   Telephone encounter was:  Unsuccessful.  03/14/2024 Name: Evelyn [name] MRN: [id number] DOB: 12/27/1929  Unsuccessful outbound call made today to assist with:  Transportation Needs   Outreach Attempt:  3rd Attempt.  Referral closed unable to contact patient.   Azell Leopard [facility]  [facility] Guide, Phone: [phone] Fax: [phone] Website: [email or web address]

## 2024-03-19 ENCOUNTER — Telehealth: Payer: Self-pay | Admitting: *Deleted

## 2024-03-19 DIAGNOSIS — H409 Unspecified glaucoma: Secondary | ICD-10-CM | POA: Diagnosis not present

## 2024-03-19 DIAGNOSIS — I10 Essential (primary) hypertension: Secondary | ICD-10-CM | POA: Diagnosis not present

## 2024-03-19 DIAGNOSIS — D72829 Elevated white blood cell count, unspecified: Secondary | ICD-10-CM | POA: Diagnosis not present

## 2024-03-19 DIAGNOSIS — E860 Dehydration: Secondary | ICD-10-CM | POA: Diagnosis not present

## 2024-03-19 DIAGNOSIS — I739 Peripheral vascular disease, unspecified: Secondary | ICD-10-CM | POA: Diagnosis not present

## 2024-03-19 DIAGNOSIS — E785 Hyperlipidemia, unspecified: Secondary | ICD-10-CM | POA: Diagnosis not present

## 2024-03-19 DIAGNOSIS — M80052D Age-related osteoporosis with current pathological fracture, left femur, subsequent encounter for fracture with routine healing: Secondary | ICD-10-CM | POA: Diagnosis not present

## 2024-03-19 DIAGNOSIS — I4819 Other persistent atrial fibrillation: Secondary | ICD-10-CM | POA: Diagnosis not present

## 2024-03-19 NOTE — Telephone Encounter (Signed)
 Copied from CRM [id number]. Topic: Clinical - Lab/Test Results >> Mar 19, 2024  2:01 PM Chuck Crater wrote: Reason for CRM: Patient has not received a call in regards to her lab results. Patient does not have Mychart. She states that she is always tired since her fall on 02/19.

## 2024-03-20 NOTE — Telephone Encounter (Signed)
 Pt called. Pt verbalized understanding

## 2024-03-21 DIAGNOSIS — I4819 Other persistent atrial fibrillation: Secondary | ICD-10-CM | POA: Diagnosis not present

## 2024-03-21 DIAGNOSIS — E785 Hyperlipidemia, unspecified: Secondary | ICD-10-CM | POA: Diagnosis not present

## 2024-03-21 DIAGNOSIS — E860 Dehydration: Secondary | ICD-10-CM | POA: Diagnosis not present

## 2024-03-21 DIAGNOSIS — H409 Unspecified glaucoma: Secondary | ICD-10-CM | POA: Diagnosis not present

## 2024-03-21 DIAGNOSIS — M80052D Age-related osteoporosis with current pathological fracture, left femur, subsequent encounter for fracture with routine healing: Secondary | ICD-10-CM | POA: Diagnosis not present

## 2024-03-21 DIAGNOSIS — I10 Essential (primary) hypertension: Secondary | ICD-10-CM | POA: Diagnosis not present

## 2024-03-21 DIAGNOSIS — D72829 Elevated white blood cell count, unspecified: Secondary | ICD-10-CM | POA: Diagnosis not present

## 2024-03-21 DIAGNOSIS — I739 Peripheral vascular disease, unspecified: Secondary | ICD-10-CM | POA: Diagnosis not present

## 2024-03-22 ENCOUNTER — Other Ambulatory Visit: Payer: Self-pay

## 2024-03-22 NOTE — Patient Outreach (Signed)
 Complex Care Management   Visit Note  03/22/2024  Name:  Evelyn [name] MRN: [id number] DOB: 17-Oct-1930  Situation: Referral received for Complex Care Management related to  Hypertension  I obtained verbal consent from Patient.  Visit completed with patient  on the phone  Background:   Past Medical History:  Diagnosis Date   Atrial fibrillation (HCC)    Hyperlipidemia    Hypertension    Osteoporosis    Post-menopausal    HRT    Assessment: Patient Reported Symptoms:  Cognitive Cognitive Status: Alert and oriented to person, place, and time      Neurological Neurological Review of Symptoms: Hearing changes (patient to schedule appt with [facility]) Neurological Self-Management Outcome: 3 (uncertain)  HEENT HEENT Symptoms Reported: No symptoms reported      Cardiovascular Cardiovascular Symptoms Reported: No symptoms reported Does patient have uncontrolled Hypertension?: Yes Is patient checking Blood Pressure at home?: Yes Patient's Recent BP reading at home: 03/22/24 at 3:00 160/82 Cardiovascular Conditions: Hypertension Cardiovascular Management Strategies: Routine screening, Medication therapy Cardiovascular Self-Management Outcome: 2 (bad)  Respiratory Respiratory Symptoms Reported: No symptoms reported    Endocrine Patient reports the following symptoms related to hypoglycemia or hyperglycemia : No symptoms reported    Gastrointestinal Gastrointestinal Symptoms Reported: No symptoms reported   Nutrition Risk Screen (CP): No indicators present (patient will record weight 3x week for review)  Genitourinary Genitourinary Symptoms Reported: No symptoms reported    Integumentary Integumentary Symptoms Reported: Other Other Integumentary Symptoms: resent request for derm referral Skin Conditions: Other Other Skin Conditions: skin tags Skin Management Strategies: Routine screening Skin Self-Management Outcome: 4 (good)  Musculoskeletal Musculoskelatal Symptoms Reviewed:  Difficulty walking (patient currently in PT still using rollator) Musculoskeletal Conditions: Mobility limited, Osteoarthritis Musculoskeletal Management Strategies: Activity, Medical device, Routine screening, Exercise Musculoskeletal Self-Management Outcome: 4 (good) Falls in the past year?:  (no falls since last assessment) Number of falls in past year: 1 or less    Psychosocial Psychosocial Symptoms Reported: No symptoms reported     Quality of Family Relationships: involved, helpful, supportive Do you feel physically threatened by others?: No      03/05/2024    1:05 PM  Depression screen PHQ 2/9  Decreased Interest 0  Down, Depressed, Hopeless 0  PHQ - 2 Score 0  Altered sleeping 0  Tired, decreased energy 0  Change in appetite 0  Feeling bad or failure about yourself  0  Trouble concentrating 0  Moving slowly or fidgety/restless 0  Suicidal thoughts 0  PHQ-9 Score 0  Difficult doing work/chores Not difficult at all    Vitals:   03/22/24 1510  BP: (!) 160/82    Medications Reviewed Today     Reviewed by Clarnce Crow, RN (Registered Nurse) on 03/22/24 at 1513  Med List Status: <None>   Medication Order Taking? Sig Documenting Provider Last Dose Status Informant  acetaminophen  (QC ACETAMINOPHEN  8 HOURS) 650 MG CR tablet [id number] Yes Take 650 mg by mouth every 8 (eight) hours as needed for pain. [provider] Taking Active   ascorbic acid (VITAMIN C) 500 MG tablet [id number] Yes Take 500 mg by mouth daily. [provider] Taking Active Self, Pharmacy Records  calcium carbonate (OS-CAL) 600 MG TABS [id number] Yes Take 600 mg by mouth daily. [provider] Taking Active Self, Pharmacy Records  Cholecalciferol (VITAMIN D  PO) [id number] Yes Take 1 capsule by mouth daily. 2000 units [provider] Taking Active Self, Pharmacy Records  ELIQUIS  2.5 MG TABS tablet  [id number] Yes TAKE 1 TABLET BY MOUTH TWICE DAILY Wenona Hamilton, MD Taking  Active Self, Pharmacy Records  glucosamine-chondroitin 500-400 MG tablet [id number] Yes Take 1 tablet by mouth daily.  [provider] Taking Active Self, Pharmacy Records  Latanoprostene Bunod [place] OP) [id number] Yes Apply 1 drop to eye once. One drop in each eye per day for glaucoma [provider] Taking Active Self  metoprolol  tartrate (LOPRESSOR ) 100 MG tablet [id number] Yes Take 1 tablet (100 mg total) by mouth 2 (two) times daily. Lowne Chase, Yvonne R, DO Taking Active            Med Note Burley Carpenter, Lashia Niese   Wed Mar 06, 2024  3:16 PM) Patient confused regarding dosage.  Reports that she understood Dr. Esmeralda Hedge to 100 mg tablet one time per day.  Order still reads 100 mg tablet 2x/day.  Called providers office and left message for clarification.  Red Yeast Rice 600 MG TABS [id number] Yes Take 1 tablet by mouth daily. [provider] Taking Active Self, Pharmacy Records  timolol (TIMOPTIC) 0.5 % ophthalmic solution [id number] Yes 1 drop every morning. [provider] Taking Active Self, Pharmacy Records            Recommendation:   Patient to take BP readings each morning after medications, and keep a log of results, as well as take weight 3 times a week for review with RNCM  Follow Up Plan:   Telephone follow up appointment date/time:  03/29/24 at 1:30  SIG  Clarnce Crow BSN RN CCM [place]  [facility], [facility] RN Care Manager Direct Dial: [phone] Fax: [phone]

## 2024-03-22 NOTE — Patient Instructions (Signed)
 Visit Information  Thank you for taking time to visit with me today. Please don't hesitate to contact me if I can be of assistance to you before our next scheduled appointment.  Our next appointment is by telephone on 03/29/24 at 1:30 Please call the care guide team at [phone] if you need to cancel or reschedule your appointment.   Following is a copy of your care plan:   Goals Addressed             This Visit's Progress    VBCI RN Care Plan       Problems:  Chronic Disease Management support and education needs related to HTN  Goal: Over the next 7 days the Patient will attend all scheduled medical appointments: 03/27/24 Pharmacy as evidenced by chart review and patient report        continue to work with RN Care Manager and/or Social Worker to address care management and care coordination needs related to HTN as evidenced by adherence to care management team scheduled appointments     demonstrate Improved adherence to prescribed treatment plan for HTN as evidenced by taking daily blood press and weight readings and recording the results for review with RNCM during net visit.  Tak all medication as prescribed.  Continue to work with Home Health Physical Therapy to improve mobility and gait imbalance in order to reduce fall risk.  Interventions:   Hypertension Interventions: Last practice recorded BP readings:  BP Readings from Last 3 Encounters:  03/22/24 (!) 160/82  03/06/24 (!) 147/71  03/05/24 (!) 185/97   Most recent eGFR/CrCl: No results found for: "EGFR"  No components found for: "CRCL"  Evaluation of current treatment plan related to hypertension self management and patient's adherence to plan as established by provider Provided education to patient re: stroke prevention, s/s of heart attack and stroke Reviewed medications with patient and discussed importance of compliance Discussed plans with patient for ongoing care management follow up and provided patient with  direct contact information for care management team Advised patient, providing education and rationale, to monitor blood pressure daily and record, calling PCP for findings outside established parameters Discussed complications of poorly controlled blood pressure such as heart disease, stroke, circulatory complications, vision complications, kidney impairment, sexual dysfunction  Patient Self-Care Activities:  Attend all scheduled provider appointments Call pharmacy for medication refills 3-7 days in advance of running out of medications Call provider office for new concerns or questions  Perform all self care activities independently  Take medications as prescribed   Work with the pharmacist to address medication management needs and will continue to work with the clinical team to address health care and disease management related needs check blood pressure daily write blood pressure results in a log or diary keep a blood pressure log take blood pressure log to all doctor appointments call doctor for signs and symptoms of high blood pressure keep all doctor appointments take medications for blood pressure exactly as prescribed report new symptoms to your doctor  Plan:  Telephone follow up appointment with care management team member scheduled for:  03/29/24 at 1:30             Please call the Suicide and Crisis Lifeline: 988 call the [place]  National Suicide Prevention Lifeline: 406 445 2546 or TTY: 913 613 8195 TTY 623-173-9176) to talk to a trained counselor call 1-800-273-TALK (toll free, 24 hour hotline) if you are experiencing a Mental Health or Behavioral Health Crisis or need someone to talk to.  Patient verbalizes understanding  of instructions and care plan provided today and agrees to view in MyChart. Active MyChart status and patient understanding of how to access instructions and care plan via MyChart confirmed with patient.      Clarnce Crow BSN RN CCM [place]   [facility], [facility] RN Care Manager Direct Dial: [phone] Fax: [phone]

## 2024-03-26 DIAGNOSIS — D72829 Elevated white blood cell count, unspecified: Secondary | ICD-10-CM | POA: Diagnosis not present

## 2024-03-26 DIAGNOSIS — E785 Hyperlipidemia, unspecified: Secondary | ICD-10-CM | POA: Diagnosis not present

## 2024-03-26 DIAGNOSIS — I4819 Other persistent atrial fibrillation: Secondary | ICD-10-CM | POA: Diagnosis not present

## 2024-03-26 DIAGNOSIS — I739 Peripheral vascular disease, unspecified: Secondary | ICD-10-CM | POA: Diagnosis not present

## 2024-03-26 DIAGNOSIS — H409 Unspecified glaucoma: Secondary | ICD-10-CM | POA: Diagnosis not present

## 2024-03-26 DIAGNOSIS — M80052D Age-related osteoporosis with current pathological fracture, left femur, subsequent encounter for fracture with routine healing: Secondary | ICD-10-CM | POA: Diagnosis not present

## 2024-03-26 DIAGNOSIS — I10 Essential (primary) hypertension: Secondary | ICD-10-CM | POA: Diagnosis not present

## 2024-03-26 DIAGNOSIS — E860 Dehydration: Secondary | ICD-10-CM | POA: Diagnosis not present

## 2024-03-27 ENCOUNTER — Ambulatory Visit (INDEPENDENT_AMBULATORY_CARE_PROVIDER_SITE_OTHER): Admitting: Pharmacist

## 2024-03-27 DIAGNOSIS — I4819 Other persistent atrial fibrillation: Secondary | ICD-10-CM

## 2024-03-27 DIAGNOSIS — I10 Essential (primary) hypertension: Secondary | ICD-10-CM

## 2024-03-27 NOTE — Progress Notes (Signed)
 Pharmacy Note  03/27/2024 Name: Evelyn [name] MRN: [id number] DOB: 05-20-1930  Subjective: Evelyn [name] is a 89 y.o. year old female who is a primary care patient of Crecencio Dodge, Candida Chalk, DO. Clinical Pharmacist Practitioner referral was placed to assist with medication management.    Engaged with patient by telephone to follow up.   She fractured her hip Feb 19th 2025. She was in the hospital for about 1 week and then in rehab for about 4 to 6 weeks. Returned home around 02/09/2024   Hypertension Current blood pressure medications: metoprolol  tartrate 50mg  twice a day she is supposed to be on metoprolol  tartrate 100mg  twice a day but there has been some confusion about the correct dose and formula for metoprolol   Prior to her hospitalization in February 2025 Mrs. Tingen was taking metoprolol  SUCCINATE 100mg  once a day. When she was discharge she was changed to metoprolol  TARTRATE 50mg  twice a day. Her blood pressure was noted to be elevated at appointment with PCP 03/05/2024 and dose of metoprolol  TARTRATE was to be increased to 100mg  twice a day.   Patient has been checking blood pressure since 03/22/24 at home but she was not able to find her record.  She was able to find the record that home health provides when they come out:  03/07/2024 blood pressure = 134/72 03/08/2024 blood pressure = 118/72 03/14/2024 blood pressure = 152/88 03/19/2024 blood pressure  = 130/60 and HR = 74 03/21/2024 blood pressure = 118/74 and HR = 68 03/26/2024 blood pressure = 142/72 and HR = 81  BP Readings from Last 3 Encounters:  03/22/24 (!) 160/82  03/06/24 (!) 147/71  03/05/24 (!) 185/97   Patient denies dizziness, lightheadedness, shortness of breath and chest pain.  She mentions that she has spells off and on of feeling fatigues and weak. She has labs with Dr Crecencio Dodge as w/u for fatigue but nothing abnormal was noted per lab notes.   Hyperlipidemia: Statin  intolerant. Taking Red Yeast Rice daily.   Afib: well controlled.  Anticoagulation: Eliquis  2.5mg  twice a day for stroke prevention  HR control - metoprolol  tartrate 50mg  twice a day  SDOH (Social Determinants of Health) assessments and interventions performed:  SDOH Interventions    Flowsheet Row Patient Outreach Telephone from 03/22/2024 in [place] POPULATION HEALTH DEPARTMENT Patient Outreach from 02/26/2024 in [place] POPULATION HEALTH DEPARTMENT Patient Outreach from 02/19/2024 in [place] POPULATION HEALTH DEPARTMENT Office Visit from 03/17/2023 in [facility] at [facility] Clinical Support from 02/22/2023 in [facility] at [facility] Chronic Care Management from 07/27/2022 in [facility] Primary Care at [facility]  SDOH Interventions        Food Insecurity Interventions Intervention Not Indicated -- Intervention Not Indicated -- Intervention Not Indicated --  Housing Interventions Intervention Not Indicated -- Intervention Not Indicated -- Intervention Not Indicated --  Transportation Interventions Intervention Not Indicated -- Intervention Not Indicated -- Intervention Not Indicated --  Utilities Interventions -- Intervention Not Indicated -- -- Intervention Not Indicated --  Alcohol Usage Interventions -- -- -- -- Intervention Not Indicated (Score <7) --  Financial Strain Interventions -- -- -- Intervention Not Indicated -- Patient Refused  [Patient is in Medicare Coverage gap but states cost of Eliquis  is affordable,  declined PAP application]  Physical Activity Interventions -- -- -- Other (Comments)  [patient to try to be more consistent with exercise.] -- --  Social Connections Interventions -- -- -- -- Intervention Not Indicated --        Objective: Review of patient status, including review of consultants reports, laboratory and other test data, was performed as part of comprehensive.  Lab  Results  Component Value Date   CREATININE 0.64 03/05/2024   CREATININE 0.77 01/23/2024   CREATININE 0.91 01/22/2024    No results found for: "HGBA1C"     Component Value Date/Time   CHOL 177 03/05/2024 1353   TRIG 110.0 03/05/2024 1353   HDL 61.70 03/05/2024 1353   CHOLHDL 3 03/05/2024 1353   VLDL 22.0 03/05/2024 1353   LDLCALC 93 03/05/2024 1353   LDLDIRECT 130.5 03/26/2013 0916     Clinical ASCVD: No  The ASCVD Risk score (Arnett DK, et al., 2019) failed to calculate for the following reasons:   The 2019 ASCVD risk score is only valid for ages 38 to 29    BP Readings from Last 3 Encounters:  03/22/24 (!) 160/82  03/06/24 (!) 147/71  03/05/24 (!) 185/97     Allergies  Allergen Reactions   Vicodin [Hydrocodone-Acetaminophen ] Other (See Comments)    Abdominal Pain   Pletaal [Cilostazol ] Diarrhea   Statins     Medications Reviewed Today     Reviewed by Cecilie Coffee, RPH-CPP (Pharmacist) on 03/27/24 at 1430  Med List Status: <None>   Medication Order Taking? Sig Documenting Provider Last Dose Status Informant  acetaminophen  (QC ACETAMINOPHEN  8 HOURS) 650 MG CR tablet [id number]  Take 650 mg by mouth every 8 (eight) hours as needed for pain. [provider]  Active   ascorbic acid (VITAMIN C) 500 MG tablet [id number]  Take 500 mg by mouth daily. [provider]  Active Self, Pharmacy Records  calcium carbonate (OS-CAL) 600 MG TABS [id number]  Take 600 mg by mouth daily. [provider]  Active Self, Pharmacy Records  Cholecalciferol (VITAMIN D  PO) [id number]  Take 1 capsule by mouth daily. 2000 units [provider]  Active Self, Pharmacy Records  ELIQUIS  2.5 MG TABS tablet [id number] Yes TAKE 1 TABLET BY MOUTH TWICE DAILY Wenona Hamilton, MD Taking Active Self, Pharmacy Records  glucosamine-chondroitin 500-400 MG tablet [id number]  Take 1 tablet by mouth daily.  [provider]  Active Self, Pharmacy Records  Latanoprostene  Bunod Kingsford OP) [id number] Yes Apply 1 drop to eye once. One drop in each eye per day for glaucoma [provider] Taking Active Self  metoprolol  tartrate (LOPRESSOR ) 100 MG tablet [id number] No Take 1 tablet (100 mg total) by mouth 2 (two) times daily.  Patient not taking: Reported on 03/27/2024   Estill Hemming, DO Not Taking Active            Med Note Burley Carpenter, LISA   Wed Mar 06, 2024  3:16 PM) Patient confused regarding dosage.  Reports that she understood Dr. Esmeralda Hedge to 100 mg tablet one time per day.  Order still reads 100 mg tablet 2x/day.  Called providers office and left message for clarification.  metoprolol  tartrate (LOPRESSOR ) 50 MG tablet [id number] Yes Take 50 mg by mouth 2 (two) times daily. [provider] Taking Active   Red Yeast Rice 600 MG TABS [id number] Yes Take 1 tablet by mouth daily. [provider] Taking Active Self, Pharmacy Records  timolol (TIMOPTIC) 0.5 % ophthalmic solution [id number] Yes 1 drop every morning. [provider] Taking Active Self, Pharmacy Records            Patient Active  Problem List   Diagnosis Date Noted   Closed displaced intertrochanteric fracture of left femur (HCC) 01/17/2024   Fall 01/17/2024   Leukocytosis 01/17/2024   Change in bowel function 02/01/2022   Hyperlipidemia 04/14/2020   Seasonal allergies 11/27/2019   Persistent atrial fibrillation (HCC) 03/26/2019   Anticoagulated 03/26/2019   Loose stools 04/14/2015   Osteoarthritis 04/14/2015   Chronic flank pain 06/17/2013   Plantar fasciitis 05/25/2013   PAD (peripheral artery disease) (HCC) 03/07/2013   Acute hip pain, left 02/05/2013   CARDIAC MURMUR 06/29/2010   VARICOSE VEINS, LOWER EXTREMITIES 06/02/2010   PLANTAR WART, LEFT 04/03/2009   Benign neoplasm of skin 04/03/2009   HIP PAIN, RIGHT 04/03/2009   Dyslipidemia, goal LDL below 70 06/29/2007   ARTIFICIAL MENOPAUSE 06/29/2007   Pain in limb 06/29/2007   Personal history  presenting hazards to health 06/29/2007   Unspecified glaucoma 04/27/2007   Essential hypertension 04/27/2007   Osteoporosis 04/27/2007     Medication Assistance:  None required.  Patient affirms current coverage meets needs.   Assessment / Plan: Hypertension: Recent readings in the office have been high but readings by home health have been close to goal. Patient reports occasional fatigue.  - Will check with Dr Jalene MayorDino Frank about her preference regarding metoprolol  TARTRATE versus SUCCINATE. I would recommend continuing either metoprolol  TARTRATE 50mg  twice a day OR metoprolol  SUCCINATE 100mg  once a day for another 1 to 2 weeks since she has experienced some fatigue. IF blood pressure > 140/90 over the next 2 weeks, then increase to metoprolol  tartrate 100mg  twice a day.   Hyperlipidemia: Statin intolerant. Last LDL was improved Continue Red Yeast Rice daily.   Afib: well controlled.  Continue Eliquis  2.5 mg twice a day for stroke prevention and metoprolol  for HR control.  (Dose of Eliquis  has been adjusted for age > 80yo and weight < 60 kg) Continue metoprolol   (checking with PCP about preferred formula, strength and dose)   Follow up in 1 to 2 weeks.  Cecilie Coffee, PharmD Clinical Pharmacist [facility] Primary Care  - [facility] [phone]

## 2024-03-29 ENCOUNTER — Other Ambulatory Visit: Payer: Self-pay

## 2024-03-29 ENCOUNTER — Telehealth: Payer: Self-pay | Admitting: Pharmacist

## 2024-03-29 MED ORDER — METOPROLOL SUCCINATE ER 100 MG PO TB24: 100.0000 mg | ORAL_TABLET | Freq: Every day | ORAL | 0 refills | Status: DC

## 2024-03-29 NOTE — Telephone Encounter (Signed)
 Patient was notified to change back to metoprolol  SUCCINATE 100mg  - take 1 tablet daily. Updated Rx sent to pharmacy but per patient she has some on hand so asked pharmacy to profile. Follow up planned for 2 to 3 weeks to check blood pressure.

## 2024-03-29 NOTE — Patient Instructions (Signed)
 Visit Information  Thank you for taking time to visit with me today. Please don't hesitate to contact me if I can be of assistance to you before our next scheduled appointment.  Our next appointment is by telephone on 04/16/24 at 1:00 Please call the care guide team at [phone] if you need to cancel or reschedule your appointment.   Following is a copy of your care plan:   Goals Addressed             This Visit's Progress    VBCI RN Care Plan   No change    Problems:  Chronic Disease Management support and education needs related to HTN  Goal: Over the next 30 days the Patient will attend all scheduled medical appointments: 03/27/24 Pharmacy as evidenced by chart review and patient report        continue to work with RN Care Manager and/or Social Worker to address care management and care coordination needs related to HTN as evidenced by adherence to care management team scheduled appointments     demonstrate Improved adherence to prescribed treatment plan for HTN as evidenced by taking daily blood press and weight readings and recording the results for review with RNCM during net visit.  Take all medication as prescribed.  Continue to work with Home Health Physical Therapy to improve mobility and gait imbalance in order to reduce fall risk.  Interventions:   Hypertension Interventions: Last practice recorded BP readings:  BP Readings from Last 3 Encounters:  03/29/24 (!) 151/67  03/22/24 (!) 160/82  03/06/24 (!) 147/71   Most recent eGFR/CrCl: No results found for: "EGFR"  No components found for: "CRCL"  Evaluation of current treatment plan related to hypertension self management and patient's adherence to plan as established by provider Provided education to patient re: stroke prevention, s/s of heart attack and stroke Reviewed medications with patient and discussed importance of compliance Discussed plans with patient for ongoing care management follow up and provided  patient with direct contact information for care management team Advised patient, providing education and rationale, to monitor blood pressure daily and record, calling PCP for findings outside established parameters Discussed complications of poorly controlled blood pressure such as heart disease, stroke, circulatory complications, vision complications, kidney impairment, sexual dysfunction  Patient Self-Care Activities:  Attend all scheduled provider appointments Call pharmacy for medication refills 3-7 days in advance of running out of medications Call provider office for new concerns or questions  Perform all self care activities independently  Take medications as prescribed   Work with the pharmacist to address medication management needs and will continue to work with the clinical team to address health care and disease management related needs check blood pressure daily write blood pressure results in a log or diary keep a blood pressure log take blood pressure log to all doctor appointments call doctor for signs and symptoms of high blood pressure keep all doctor appointments take medications for blood pressure exactly as prescribed report new symptoms to your doctor  Plan:  Telephone follow up appointment with care management team member scheduled for:  04/16/24 at 1:00             Please call the Suicide and Crisis Lifeline: 988 call the [place]  National Suicide Prevention Lifeline: 2340360976 or TTY: (202) 712-7307 TTY 669 619 9245) to talk to a trained counselor call 1-800-273-TALK (toll free, 24 hour hotline) if you are experiencing a Mental Health or Behavioral Health Crisis or need someone to talk to.  Patient verbalizes  understanding of instructions and care plan provided today and agrees to view in MyChart. Active MyChart status and patient understanding of how to access instructions and care plan via MyChart confirmed with patient.     Clarnce Crow BSN RN  CCM [place]  [facility], [facility] RN Care Manager Direct Dial: [phone] Fax: [phone]

## 2024-03-29 NOTE — Telephone Encounter (Signed)
-----   Message from Efrain Grant Chase sent at 03/29/2024  1:35 PM EDT ----- I prefer the every day dosing over bid dosing ------- just because it is usually easier for the pt but I'm good with either ---- have her f/u 2-3 weeks to f/u bp ----- Message ----- From: Cecilie Coffee, RPH-CPP Sent: 03/27/2024   2:57 PM EDT To: Estill Hemming, DO  Pt is still taking metoprolol  tartrate 50mg  BID. Prior to hospitalization in Feb 2025 she was taking metoprolol  SUCCINATE 100mg  QD. When she was discharge, was changed to metoprolol  TARTRATE 50mg  twice a day. Her blood pressure was elevated at appt 04/08., Dose of metoprolol  TARTRATE was to be increased to 100mg  twice a day but she cancelled this Rx because she thought it was the same that she already had at home.  Patient has been checking b.p. since 03/22/24 at home but she was not able to find her record. She was able to find the record that home health provides when they come out:  4/10 b.p. = 134/72; 4/11 b.p.= 118/72; 4/17 b.p.=152/88; 4/22 b.p.= 130/60 and HR = 74; 4/24 b.p.= 118/74 and HR = 68; 4/29 b.p.  142/72 and HR = 81 What would you like her to take? She has had some fatigue so could consider continuing either metoprolol  TARTRATE 50mg  BID OR metoprolol  SUCCINATE 100mg  QD for another 1 to 2 weeks. IF blood pressure > 140/90 over the next 2 weeks, then increase to metoprolol  tartrate 100mg  BID?

## 2024-03-29 NOTE — Patient Outreach (Signed)
 Complex Care Management   Visit Note  03/29/2024  Name:  Evelyn [name] MRN: [id number] DOB: May 13, 1930  Situation: Referral received for Complex Care Management related to  Hypertension  I obtained verbal consent from Patient.  Visit completed with patient  on the phone  patient required additional teaching and instruction on proper dosage of metoprolol .  She currently has 50 mg tablets at home, was only taking 1 tablet twice daily.  I explained that she should be taking 100 mg tablets twice daily.  She has not yet picked those up from the pharmacy, so she will now take two 50 mg tablets twice daily until they are gone, will contact pharmacy to have 100 mg rx filled, and understands instructions as written.    Background:   Past Medical History:  Diagnosis Date   Atrial fibrillation (HCC)    Hyperlipidemia    Hypertension    Osteoporosis    Post-menopausal    HRT    Assessment: Patient Reported Symptoms:  Cognitive Cognitive Status: Alert and oriented to person, place, and time      Neurological Neurological Review of Symptoms: No symptoms reported, Hearing changes (patient has not yet scheduled with [facility] audiology) Neurological Management Strategies: Routine screening Neurological Self-Management Outcome: 3 (uncertain)  HEENT HEENT Symptoms Reported: Change or loss of hearing (still has not scheduled hearing evaluation) HEENT Management Strategies: Routine screening HEENT Self-Management Outcome: 3 (uncertain)    Cardiovascular Cardiovascular Symptoms Reported: No symptoms reported Does patient have uncontrolled Hypertension?: Yes Is patient checking Blood Pressure at home?: Yes Patient's Recent BP reading at home: 03/29/24 151/67 Cardiovascular Conditions: Hypertension Cardiovascular Management Strategies: Medication therapy, Routine screening, Exercise Weight: 110 lb (49.9 kg) Cardiovascular Self-Management Outcome: 3 (uncertain)  Respiratory Respiratory Symptoms Reported: No  symptoms reported    Endocrine Patient reports the following symptoms related to hypoglycemia or hyperglycemia : No symptoms reported Is patient diabetic?: No    Gastrointestinal Gastrointestinal Symptoms Reported: No symptoms reported   Nutrition Risk Screen (CP): No indicators present  Genitourinary Genitourinary Symptoms Reported: No symptoms reported    Integumentary Other Integumentary Symptoms: appointment with derm for 04/08/24 Other Skin Conditions: moles Skin Management Strategies: Routine screening Skin Self-Management Outcome: 4 (good)  Musculoskeletal Musculoskelatal Symptoms Reviewed: Difficulty walking, Unsteady gait Additional Musculoskeletal Details: still receiving home physical therapy Musculoskeletal Conditions: Joint pain Musculoskeletal Management Strategies: Medication therapy, Routine screening Musculoskeletal Self-Management Outcome: 3 (uncertain) Falls in the past year?: No Patient at Risk for Falls Due to: History of fall(s), Impaired balance/gait, Impaired mobility Fall risk Follow up: Falls prevention discussed, Education provided  Psychosocial Psychosocial Symptoms Reported: No symptoms reported            03/05/2024    1:05 PM  Depression screen PHQ 2/9  Decreased Interest 0  Down, Depressed, Hopeless 0  PHQ - 2 Score 0  Altered sleeping 0  Tired, decreased energy 0  Change in appetite 0  Feeling bad or failure about yourself  0  Trouble concentrating 0  Moving slowly or fidgety/restless 0  Suicidal thoughts 0  PHQ-9 Score 0  Difficult doing work/chores Not difficult at all    Vitals:   03/29/24 1331  BP: (!) 151/67  Pulse: 81    Medications Reviewed Today   Medications were not reviewed in this encounter     Recommendation:   PCP Follow-up  Follow Up Plan:   Telephone follow up appointment date/time:  04/16/24 at 1:00   Clarnce Crow BSN RN CCM [place]  [facility], [facility] RN Care  Manager Direct Dial: [phone] Fax: [phone]

## 2024-04-01 DIAGNOSIS — D72829 Elevated white blood cell count, unspecified: Secondary | ICD-10-CM | POA: Diagnosis not present

## 2024-04-01 DIAGNOSIS — M80052D Age-related osteoporosis with current pathological fracture, left femur, subsequent encounter for fracture with routine healing: Secondary | ICD-10-CM | POA: Diagnosis not present

## 2024-04-01 DIAGNOSIS — E860 Dehydration: Secondary | ICD-10-CM | POA: Diagnosis not present

## 2024-04-01 DIAGNOSIS — I739 Peripheral vascular disease, unspecified: Secondary | ICD-10-CM | POA: Diagnosis not present

## 2024-04-01 DIAGNOSIS — I4819 Other persistent atrial fibrillation: Secondary | ICD-10-CM | POA: Diagnosis not present

## 2024-04-01 DIAGNOSIS — H409 Unspecified glaucoma: Secondary | ICD-10-CM | POA: Diagnosis not present

## 2024-04-01 DIAGNOSIS — I10 Essential (primary) hypertension: Secondary | ICD-10-CM | POA: Diagnosis not present

## 2024-04-01 DIAGNOSIS — E785 Hyperlipidemia, unspecified: Secondary | ICD-10-CM | POA: Diagnosis not present

## 2024-04-08 DIAGNOSIS — L820 Inflamed seborrheic keratosis: Secondary | ICD-10-CM | POA: Diagnosis not present

## 2024-04-08 DIAGNOSIS — B078 Other viral warts: Secondary | ICD-10-CM | POA: Diagnosis not present

## 2024-04-09 ENCOUNTER — Telehealth: Payer: Self-pay

## 2024-04-09 ENCOUNTER — Other Ambulatory Visit: Payer: Self-pay | Admitting: Pharmacist

## 2024-04-09 DIAGNOSIS — Z8781 Personal history of (healed) traumatic fracture: Secondary | ICD-10-CM | POA: Diagnosis not present

## 2024-04-09 NOTE — Telephone Encounter (Signed)
 Initial Comment Caller states she need help with know how to take her BP medication. Caller states the instructions are different on her new prescription. Caller states she been having pain around her waist(Kidney towards the back) and it goes away when she sits down. Caller states the name of her medication is Metoprolol . Caller states to leave a message on her answering machine if she don't answer on how to take her medication. Caller states she has 100 mg of Metoprolol  and she was taking it twice a day now the instruction say to take it once a day. Caller states the nurse told her to take it twice a day. Translation No Nurse Assessment Nurse: Evelyn Lighter, RN, Evelyn [name] Date/Time Evelyn [name] Time): 04/08/2024 8:35:25 PM Confirm and document reason for call. If symptomatic, describe symptoms. ---Caller states she has some concerns about her medication Metoprolol  100mg  bid she has been taken this dose for a couple weeks. When she picked it up at the pharmacy this time it says take 100mg  once a day. She has had pain in her b/l back. Pain goes away after she takes a Tylenol  and she sits down. B/P now is 158/88 P 80. She wants to know if she should be taking medication once or twice a day? Patient has no conditions she wants to discuss. Does the patient have any new or worsening symptoms? ---Yes Will a triage be completed? ---Yes Related visit to physician within the last 2 weeks? ---No Does the PT have any chronic conditions? (i.e. diabetes, asthma, this includes High risk factors for pregnancy, etc.) ---Yes List chronic conditions. ---HTN Is this a behavioral health or substance abuse call? ---No PLEASE NOTE: All timestamps contained within this report are represented as [place] Standard Time. CONFIDENTIALTY NOTICE: This fax transmission is intended only for the addressee. It contains information that is legally privileged, confidential or otherwise protected from use or disclosure. If you  are not the intended recipient, you are strictly prohibited from reviewing, disclosing, copying using or disseminating any of this information or taking any action in reliance on or regarding this information. If you have received this fax in error, please notify us  immediately by telephone so that we can arrange for its return to us . Phone: [phone], Toll-Free: [phone], Fax: [phone] Evelyn [name] 1930/02/19 Page: 2 of 3 CallId: [id number] Guidelines Guideline Title Affirmed Question Affirmed Notes Nurse Date/Time Evelyn [name] Time) Blood Pressure - High [1] Systolic BP >= 130 OR Diastolic >= 80 AND [2] taking BP medications Evelyn [name] 04/08/2024 8:46:02 PM Disp. Time Evelyn [name] Time) Disposition Final User 04/08/2024 8:11:17 PM Attempt made - message left Evelyn [name] 04/08/2024 8:50:13 PM See PCP within 2 Weeks Yes Evelyn [name], RNFelipa [name] 04/08/2024 8:56:27 PM Paged On Call back to Call Center Evelyn Lighter, RN, [facility] Final Disposition 04/08/2024 8:50:13 PM See PCP within 2 Weeks Yes Evelyn Lighter, RN, Evelyn [name] Disagree/Comply Comply Caller Understands Yes PreDisposition Call another nurse Care Advice Given Per Guideline SEE PCP WITHIN 2 WEEKS: * You need to be seen for this ongoing problem within the next 2 weeks. REASSURANCE AND EDUCATION - TAKING HIGH BLOOD PRESSURE MEDICINES: * Your blood pressure is high but you have told me that you are not having any symptoms. * You should see your doctor (or NP/PA) and have your blood pressure checked within 2 weeks. * You might need to have a change in your medicine(s). CALL BACK IF: * You become worse * Your blood pressure is over 160/100 * Chest pain or difficulty breathing occurs *  Difficulty walking, difficulty talking, or severe headache occurs * Weakness or numbness of the face, arm or leg on one side of the body occurs CARE ADVICE given per Blood Pressure - High (Adult) guideline. Comments User: Evelyn Casa, RN  Date/Time Evelyn [name] Time): 04/08/2024 9:10:12 PM Spoke with patient to relay physician's orders, she states understanding. Referrals REFERRED TO PCP OFFICE Paging DoctorName Phone DateTime Result/ Outcome Message Type Notes Evelyn [name] - MD [id number] 04/08/2024 8:56:27 PM Paged On Call Back to Call Center Doctor Paged Please call back [phone] PLEASE NOTE: All timestamps contained within this report are represented as [place] Standard Time. CONFIDENTIALTY NOTICE: This fax transmission is intended only for the addressee. It contains information that is legally privileged, confidential or otherwise protected from use or disclosure. If you are not the intended recipient, you are strictly prohibited from reviewing, disclosing, copying using or disseminating any of this information or taking any action in reliance on or regarding this information. If you have received this fax in error, please notify us  immediately by telephone so that we can arrange for its return to us . Phone: [phone], Toll-Free: [phone], Fax: [phone] Evelyn [name] [birth year] Page: 3 of 3 CallId: [id number] Paging DoctorName Phone DateTime Result/ Outcome Message Type Notes Evelyn [name] - MD 04/08/2024 9:02:09 PM Spoke with On Call - General Message Result Call PCP tomorrow morning to clarify RX for Motoprolo. Do not take medication tonight

## 2024-04-09 NOTE — Telephone Encounter (Signed)
 Please see phone note from 03/29/2024 - per Dr Crecencio Dodge she preferred patient to go back to metoprolol  succinate 100mg  Er - take 1 tablet daily for her blood pressure.  Updated Rx was sent to pharmacy and other prescriptions for metoprolol  tartrate were cancelled. Patient was notified and she stated she had metoprolol  succinate at home from when she was taking previously.   I was scheduled to follow up with patient by phone today to check her home blood pressure readings. Unable to reach patient by phone but I left a message about taking metoprolol  SUCCINATE 100mg  ER ONCE a day.  Also left my CB # [phone] so that I can review her recent blood pressure readings.

## 2024-04-09 NOTE — Progress Notes (Signed)
 Pharmacy Note  04/09/2024 Name: Evelyn [name] MRN: [id number] DOB: 1930-03-17  Subjective: Evelyn [name] is a 89 y.o. year old female who is a primary care patient of Evelyn [name], Evelyn Chalk, DO. Clinical Pharmacist Practitioner referral was placed to assist with medication management.    Engaged with patient by telephone to follow up.   She fractured her hip Feb 19th 2025. She was in the hospital for about 1 week and then in rehab for about 4 to 6 weeks. Returned home around 02/09/2024  Hypertension Current blood pressure medications: metoprolol  succinate ER 100mg  once a day however patient was confused with metoprolol  tartrate 100mg  and was taking metoprolol  succinate ER 100mg  twice a day until yesterday.   Prior to her hospitalization in February 2025 Evelyn [name] was taking metoprolol  SUCCINATE 100mg  once a day and amlodipine  5mg  daily.  When she was discharge she was changed to metoprolol  TARTRATE 50mg  twice a day. Her blood pressure was noted to be elevated at appointment with PCP 03/05/2024 and dose of metoprolol  TARTRATE was to be increased to 100mg  twice a day.   Patient has been checking blood pressure since 03/22/24 at home. She reports readings have been high SBP 140 to 150 and DBP 80's She was not able to find her recorded readings but has appointment with nurse care manager to review in a few day.   BP Readings from Last 3 Encounters:  03/29/24 (!) 151/67  03/22/24 (!) 160/82  03/06/24 (!) 147/71   Patient reports feeling "washed out" some does but denied dizziness, lightheadedness, shortness of breath and chest pain.  She had labs with Dr Evelyn [name] as w/u for fatigue but nothing abnormal was noted per lab notes.    SDOH (Social Determinants of Health) assessments and interventions performed:  SDOH Interventions    Flowsheet Row Patient Outreach Telephone from 03/29/2024 in [place] POPULATION HEALTH DEPARTMENT Patient Outreach  Telephone from 03/22/2024 in [place] POPULATION HEALTH DEPARTMENT Patient Outreach from 02/26/2024 in [place] POPULATION HEALTH DEPARTMENT Patient Outreach from 02/19/2024 in [place] POPULATION HEALTH DEPARTMENT Office Visit from 03/17/2023 in [facility] at [facility] Clinical Support from 02/22/2023 in [facility] Primary Care at [facility]  SDOH Interventions        Food Insecurity Interventions -- Intervention Not Indicated -- Intervention Not Indicated -- Intervention Not Indicated  Housing Interventions -- Intervention Not Indicated -- Intervention Not Indicated -- Intervention Not Indicated  Transportation Interventions -- Intervention Not Indicated -- Intervention Not Indicated -- Intervention Not Indicated  Utilities Interventions Intervention Not Indicated -- Intervention Not Indicated -- -- Intervention Not Indicated  Alcohol Usage Interventions -- -- -- -- -- Intervention Not Indicated (Score <7)  Financial Strain Interventions -- -- -- -- Intervention Not Indicated --  Physical Activity Interventions -- -- -- -- Other (Comments)  [patient to try to be more consistent with exercise.] --  Social Connections Interventions -- -- -- -- -- Intervention Not Indicated        Objective: Review of patient status, including review of consultants reports, laboratory and other test data, was performed as part of comprehensive.  Lab Results  Component Value Date   CREATININE 0.64 03/05/2024   CREATININE 0.77 01/23/2024   CREATININE 0.91 01/22/2024    No results found for: "HGBA1C"     Component Value Date/Time   CHOL 177 03/05/2024 1353   TRIG 110.0 03/05/2024 1353   HDL 61.70 03/05/2024 1353  CHOLHDL 3 03/05/2024 1353   VLDL 22.0 03/05/2024 1353   LDLCALC 93 03/05/2024 1353   LDLDIRECT 130.5 03/26/2013 0916     Clinical ASCVD: No  The ASCVD Risk score (Arnett DK, et al., 2019) failed to calculate for the following  reasons:   The 2019 ASCVD risk score is only valid for ages 64 to 42    BP Readings from Last 3 Encounters:  03/29/24 (!) 151/67  03/22/24 (!) 160/82  03/06/24 (!) 147/71     Allergies  Allergen Reactions   Vicodin [Hydrocodone-Acetaminophen ] Other (See Comments)    Abdominal Pain   Pletaal [Cilostazol ] Diarrhea   Statins     Medications Reviewed Today     Reviewed by Evelyn [name], RPH-CPP (Pharmacist) on 04/09/24 at 1607  Med List Status: <None>   Medication Order Taking? Sig Documenting Provider Last Dose Status Informant  acetaminophen  (QC ACETAMINOPHEN  8 HOURS) 650 MG CR tablet [id number]  Take 650 mg by mouth every 8 (eight) hours as needed for pain. [provider]  Active   ascorbic acid (VITAMIN C) 500 MG tablet [id number]  Take 500 mg by mouth daily. [provider]  Active Self, Pharmacy Records  calcium carbonate (OS-CAL) 600 MG TABS [id number] Yes Take 600 mg by mouth daily. [provider] Taking Active Self, Pharmacy Records  Cholecalciferol (VITAMIN D  PO) [id number] Yes Take 1 capsule by mouth daily. 2000 units [provider] Taking Active Self, Pharmacy Records  ELIQUIS  2.5 MG TABS tablet [id number] Yes TAKE 1 TABLET BY MOUTH TWICE DAILY Evelyn Hamilton, MD Taking Active Self, Pharmacy Records  glucosamine-chondroitin 500-400 MG tablet [id number]  Take 1 tablet by mouth daily.  [provider]  Active Self, Pharmacy Records  Latanoprostene Bunod Bandera OP) [id number] Yes Apply 1 drop to eye once. One drop in each eye per day for glaucoma [provider] Taking Active Self  metoprolol  succinate (TOPROL -XL) 100 MG 24 hr tablet [id number] Yes Take 1 tablet (100 mg total) by mouth daily. Take with or immediately following a meal. Evelyn [name], Evelyn Chalk, DO Taking Active   Red Yeast Rice 600 MG TABS [id number] Yes Take 1 tablet by mouth daily. [provider] Taking Active Self, Pharmacy Records  timolol (TIMOPTIC) 0.5  % ophthalmic solution [id number] Yes 1 drop every morning. [provider] Taking Active Self, Pharmacy Records            Patient Active Problem List   Diagnosis Date Noted   Closed displaced intertrochanteric fracture of left femur (HCC) 01/17/2024   Fall 01/17/2024   Leukocytosis 01/17/2024   Change in bowel function 02/01/2022   Hyperlipidemia 04/14/2020   Seasonal allergies 11/27/2019   Persistent atrial fibrillation (HCC) 03/26/2019   Anticoagulated 03/26/2019   Loose stools 04/14/2015   Osteoarthritis 04/14/2015   Chronic flank pain 06/17/2013   Plantar fasciitis 05/25/2013   PAD (peripheral artery disease) (HCC) 03/07/2013   Acute hip pain, left 02/05/2013   CARDIAC MURMUR 06/29/2010   VARICOSE VEINS, LOWER EXTREMITIES 06/02/2010   PLANTAR WART, LEFT 04/03/2009   Benign neoplasm of skin 04/03/2009   HIP PAIN, RIGHT 04/03/2009   Dyslipidemia, goal LDL below 70 06/29/2007   ARTIFICIAL MENOPAUSE 06/29/2007   Pain in limb 06/29/2007   Personal history presenting hazards to health 06/29/2007   Unspecified glaucoma 04/27/2007   Essential hypertension 04/27/2007   Osteoporosis 04/27/2007     Medication Assistance:  None required.  Patient affirms current coverage meets needs.   Assessment / Plan: Hypertension:  Recent readings in the office have been high but readings by home health have been close to goal.  - recommend continue to take metoprolol  SUCCINATE ER 100mg  ONCE a day.  - Will check with PCP about adding additional blood pressure lowering agent - either valsartan 40mg  daily or amlodipine  2.5mg  daily. - planned to have patient come into office for blood pressure check but she is not driving yet and she does not have family that can transport her in the next 7 to 10 days.  - Continue to check blood pressure at home and record. Nurse care manager has phone follow up with patient in 2  days.   Follow up in 1 to 2 weeks.  Evelyn [name], PharmD Clinical  Pharmacist [facility] Primary Care  - [facility] [phone]

## 2024-04-10 DIAGNOSIS — H409 Unspecified glaucoma: Secondary | ICD-10-CM | POA: Diagnosis not present

## 2024-04-10 DIAGNOSIS — E860 Dehydration: Secondary | ICD-10-CM | POA: Diagnosis not present

## 2024-04-10 DIAGNOSIS — I10 Essential (primary) hypertension: Secondary | ICD-10-CM | POA: Diagnosis not present

## 2024-04-10 DIAGNOSIS — M80052D Age-related osteoporosis with current pathological fracture, left femur, subsequent encounter for fracture with routine healing: Secondary | ICD-10-CM | POA: Diagnosis not present

## 2024-04-10 DIAGNOSIS — I739 Peripheral vascular disease, unspecified: Secondary | ICD-10-CM | POA: Diagnosis not present

## 2024-04-10 DIAGNOSIS — D72829 Elevated white blood cell count, unspecified: Secondary | ICD-10-CM | POA: Diagnosis not present

## 2024-04-10 DIAGNOSIS — I4819 Other persistent atrial fibrillation: Secondary | ICD-10-CM | POA: Diagnosis not present

## 2024-04-10 DIAGNOSIS — E785 Hyperlipidemia, unspecified: Secondary | ICD-10-CM | POA: Diagnosis not present

## 2024-04-16 ENCOUNTER — Other Ambulatory Visit: Payer: Self-pay

## 2024-04-16 NOTE — Patient Outreach (Signed)
 Complex Care Management   Visit Note  04/16/2024  Name:  Evelyn [name] MRN: [id number] DOB: 1930/02/07  Situation: Referral received for Complex Care Management related to HTN I obtained verbal consent from Patient.  Visit completed with patient  on the phone  Background:   Past Medical History:  Diagnosis Date   Atrial fibrillation (HCC)    Hyperlipidemia    Hypertension    Osteoporosis    Post-menopausal    HRT    Assessment: Patient Reported Symptoms:  Cognitive Cognitive Status: Alert and oriented to person, place, and time      Neurological Neurological Review of Symptoms: No symptoms reported    HEENT HEENT Symptoms Reported: Change or loss of hearing HEENT Conditions: Ear problem(s) HEENT Management Strategies: Routine screening Ear problem(s)  Cardiovascular Cardiovascular Symptoms Reported: No symptoms reported Is patient checking Blood Pressure at home?: Yes Patient's Recent BP reading at home: 179/101 Cardiovascular Conditions: Hypertension, High blood cholesterol Cardiovascular Management Strategies: Medication therapy, Routine screening, Weight management, Diet modification Do You Have a Working Readable Scale?: Yes Cardiovascular Self-Management Outcome: 3 (uncertain)  Respiratory Respiratory Symptoms Reported: No symptoms reported    Endocrine Patient reports the following symptoms related to hypoglycemia or hyperglycemia : No symptoms reported Is patient diabetic?: No    Gastrointestinal Gastrointestinal Symptoms Reported: No symptoms reported      Genitourinary Genitourinary Symptoms Reported: Frequency Genitourinary Conditions: Frequency Genitourinary Management Strategies: Medication therapy  Integumentary Integumentary Symptoms Reported: Skin changes Other Integumentary Symptoms: had warts/moles frozen Skin Conditions: Other, Itching Skin Management Strategies: Routine screening  Musculoskeletal Additional Musculoskeletal Details: was released  from [facility] PT   patient reports HEP has really helped Musculoskeletal Conditions: Joint pain Musculoskeletal Management Strategies: Medication therapy, Routine screening Musculoskeletal Self-Management Outcome: 3 (uncertain) Falls in the past year?: No Number of falls in past year: 1 or less Patient at Risk for Falls Due to: Impaired mobility, Impaired balance/gait Fall risk Follow up: Falls prevention discussed, Education provided  Psychosocial Psychosocial Symptoms Reported: No symptoms reported            03/05/2024    1:05 PM  Depression screen PHQ 2/9  Decreased Interest 0  Down, Depressed, Hopeless 0  PHQ - 2 Score 0  Altered sleeping 0  Tired, decreased energy 0  Change in appetite 0  Feeling bad or failure about yourself  0  Trouble concentrating 0  Moving slowly or fidgety/restless 0  Suicidal thoughts 0  PHQ-9 Score 0  Difficult doing work/chores Not difficult at all    Vitals:   04/16/24 1336 04/16/24 1340  BP: (!) 169/64 (!) 155/71  Pulse: 77     Medications Reviewed Today   Medications were not reviewed in this encounter     Recommendation:   PCP Follow-up  Follow Up Plan:   Telephone follow up appointment date/time:  04/29/24   Clarnce Crow BSN RN CCM [place]  [facility], [facility] RN Care Manager Direct Dial: [phone] Fax: [phone]

## 2024-04-16 NOTE — Patient Instructions (Signed)
 Visit Information  Thank you for taking time to visit with me today. Please don't hesitate to contact me if I can be of assistance to you before our next scheduled appointment.  Our next appointment is by telephone on 04/29/24 at 1:30 Please call the care guide team at [phone] if you need to cancel or reschedule your appointment.   Following is a copy of your care plan:   Goals Addressed             This Visit's Progress    VBCI RN Care Plan   Improving    Problems:  Chronic Disease Management support and education needs related to HTN  Goal: Over the next 30 days the Patient will attend all scheduled medical appointments: 04/23/24 Pharmacy as evidenced by chart review and patient report        continue to work with RN Care Manager and/or Social Worker to address care management and care coordination needs related to HTN as evidenced by adherence to care management team scheduled appointments     demonstrate Improved adherence to prescribed treatment plan for HTN as evidenced by taking daily blood press and weight readings and recording the results for review with RNCM during net visit.  Take all medication as prescribed.  Continue to work with Home Health Physical Therapy to improve mobility and gait imbalance in order to reduce fall risk.  Interventions:   Hypertension Interventions: Last practice recorded BP readings:  BP Readings from Last 3 Encounters:  04/16/24 (!) 155/71  03/29/24 (!) 151/67  03/22/24 (!) 160/82   Most recent eGFR/CrCl: No results found for: "EGFR"  No components found for: "CRCL"  Evaluation of current treatment plan related to hypertension self management and patient's adherence to plan as established by provider Provided education to patient re: stroke prevention, s/s of heart attack and stroke Reviewed medications with patient and discussed importance of compliance Discussed plans with patient for ongoing care management follow up and provided  patient with direct contact information for care management team Advised patient, providing education and rationale, to monitor blood pressure daily and record, calling PCP for findings outside established parameters Discussed complications of poorly controlled blood pressure such as heart disease, stroke, circulatory complications, vision complications, kidney impairment, sexual dysfunction  Patient Self-Care Activities:  Attend all scheduled provider appointments Call pharmacy for medication refills 3-7 days in advance of running out of medications Call provider office for new concerns or questions  Perform all self care activities independently  Take medications as prescribed   Work with the pharmacist to address medication management needs and will continue to work with the clinical team to address health care and disease management related needs check blood pressure daily write blood pressure results in a log or diary keep a blood pressure log take blood pressure log to all doctor appointments call doctor for signs and symptoms of high blood pressure keep all doctor appointments take medications for blood pressure exactly as prescribed report new symptoms to your doctor  Plan:  Telephone follow up appointment with care management team member scheduled for:  04/29/24 at 1:00             Please call the Suicide and Crisis Lifeline: 988 call the [place]  National Suicide Prevention Lifeline: (403) 597-2127 or TTY: 539-485-7729 TTY 657-111-6260) to talk to a trained counselor call 1-800-273-TALK (toll free, 24 hour hotline) if you are experiencing a Mental Health or Behavioral Health Crisis or need someone to talk to.  Patient verbalizes understanding  of instructions and care plan provided today and agrees to view in MyChart. Active MyChart status and patient understanding of how to access instructions and care plan via MyChart confirmed with patient.      Clarnce Crow BSN RN  CCM [place]  [facility], [facility] RN Care Manager Direct Dial: [phone] Fax: [phone]

## 2024-04-17 ENCOUNTER — Telehealth: Payer: Self-pay | Admitting: *Deleted

## 2024-04-17 NOTE — Telephone Encounter (Signed)
 Pt called. LVM to schedule appt

## 2024-04-17 NOTE — Telephone Encounter (Signed)
-----   Message from Estill Hemming sent at 04/16/2024  5:10 PM EDT ----- How is the bp now -- can pt come in to office ----- Message ----- From: Clarnce Crow, RN Sent: 04/16/2024   1:51 PM EDT To: Cecilie Coffee, RPH-CPP; #  FYI  just completed a visit with Evelyn [name].  She is doing well other than her BP still remains high.  First taken today was 179/101.  She had just taken her medication approx 1 hour prior, I had her sit and rest and talk with me, finally went down to 155/71.  She is taking the metoprolol  succinate 100 mg once daily.  I had her read the bottle to me to confirm.     Clarnce Crow BSN RN CCM [place]  [facility], [facility] RN Care Manager Direct Dial: [phone] Fax:[phone]

## 2024-04-23 ENCOUNTER — Other Ambulatory Visit: Payer: Self-pay | Admitting: Pharmacist

## 2024-04-23 DIAGNOSIS — I10 Essential (primary) hypertension: Secondary | ICD-10-CM

## 2024-04-23 NOTE — Progress Notes (Signed)
 Pharmacy Note  04/23/2024 Name: Evelyn [name] MRN: [id number] DOB: [birth year]  Subjective: Evelyn [name] is a 89 y.o. year old female who is a primary care patient of Evelyn [name], Evelyn Chalk, DO. Clinical Pharmacist Practitioner referral was placed to assist with medication management.    Engaged with patient by telephone to follow up.   She fractured her hip Feb 19th 2025. She was in the hospital for about 1 week and then in rehab for about 4 to 6 weeks. Returned home around 02/09/2024. She is not walking on her own yet, using rollator. She also is not driving yet either.   Hypertension Current blood pressure medications: metoprolol  succinate ER 100mg  once a day   Prior to her hospitalization in February 2025 Evelyn [name] was taking metoprolol  SUCCINATE 100mg  once a day and amlodipine  5mg  daily. When she was discharge she was changed to metoprolol  TARTRATE 50mg  twice a day. Her blood pressure was noted to be elevated at appointment with PCP 03/05/2024 and dose of metoprolol  TARTRATE was to be increased to 100mg  twice a day. Amlodipine  5mg  was discontinued during hospitalization.   Patient has been checking blood pressure at home.  5/21: blood pressure = 152/80 and HR = 83 5/22: blood pressure = 146/76 and HR = 96 5/23: blood pressure = 129/62 and HR = 93 5/24: blood pressure = 154/88 and HR = 74 5/25: blood pressure = 156/73 and HR = 83 5/26: blood pressure = 168/88 and HR = 77   BP Readings from Last 3 Encounters:  04/16/24 (!) 155/71  03/29/24 (!) 151/67  03/22/24 (!) 160/82    SDOH (Social Determinants of Health) assessments and interventions performed:  SDOH Interventions    Flowsheet Row Patient Outreach Telephone from 04/16/2024 in [place] [facility] Patient Outreach Telephone from 03/29/2024 in [place] POP[facility]ATION [facility] DEPARTMENT Patient Outreach Telephone from 03/22/2024 in [place] POPULATION HEALTH DEPARTMENT  Patient Outreach from 02/26/2024 in [place] POPULATION HEALTH DEPARTMENT Patient Outreach from 02/19/2024 in [place] POPULATION HEALTH DEPARTMENT Office Visit from 03/17/2023 in [facility]  SDOH Interventions        Food Insecurity Interventions Intervention Not Indicated -- Intervention Not Indicated -- Intervention Not Indicated --  Housing Interventions Intervention Not Indicated -- Intervention Not Indicated -- Intervention Not Indicated --  Transportation Interventions Intervention Not Indicated -- Intervention Not Indicated -- Intervention Not Indicated --  Utilities Interventions Intervention Not Indicated Intervention Not Indicated -- Intervention Not Indicated -- --  Financial Strain Interventions -- -- -- -- -- Intervention Not Indicated  Physical Activity Interventions -- -- -- -- -- Other (Comments)  [patient to try to be more consistent with exercise.]        Objective: Review of patient status, including review of consultants reports, laboratory and other test data, was performed as part of comprehensive.  Lab Results  Component Value Date   CREATININE 0.64 03/05/2024   CREATININE 0.77 01/23/2024   CREATININE 0.91 01/22/2024    No results found for: "HGBA1C"     Component Value Date/Time   CHOL 177 03/05/2024 1353   TRIG 110.0 03/05/2024 1353   HDL 61.70 03/05/2024 1353   CHOLHDL 3 03/05/2024 1353   VLDL 22.0 03/05/2024 1353   LDLCALC 93 03/05/2024 1353   LDLDIRECT 130.5 03/26/2013 0916     Clinical ASCVD: No  The ASCVD Risk score (Arnett DK, et al., 2019) failed to calculate for the  following reasons:   The 2019 ASCVD risk score is only valid for ages 55 to 71    BP Readings from Last 3 Encounters:  04/16/24 (!) 155/71  03/29/24 (!) 151/67  03/22/24 (!) 160/82     Allergies  Allergen Reactions   Vicodin [Hydrocodone-Acetaminophen ] Other (See Comments)    Abdominal Pain   Pletaal [Cilostazol ] Diarrhea    Statins     Medications Reviewed Today   Medications were not reviewed in this encounter     Patient Active Problem List   Diagnosis Date Noted   Closed displaced intertrochanteric fracture of left femur (HCC) 01/17/2024   Fall 01/17/2024   Leukocytosis 01/17/2024   Change in bowel function 02/01/2022   Hyperlipidemia 04/14/2020   Seasonal allergies 11/27/2019   Persistent atrial fibrillation (HCC) 03/26/2019   Anticoagulated 03/26/2019   Loose stools 04/14/2015   Osteoarthritis 04/14/2015   Chronic flank pain 06/17/2013   Plantar fasciitis 05/25/2013   PAD (peripheral artery disease) (HCC) 03/07/2013   Acute hip pain, left 02/05/2013   CARDIAC MURMUR 06/29/2010   VARICOSE VEINS, LOWER EXTREMITIES 06/02/2010   PLANTAR WART, LEFT 04/03/2009   Benign neoplasm of skin 04/03/2009   HIP PAIN, RIGHT 04/03/2009   Dyslipidemia, goal LDL below 70 06/29/2007   ARTIFICIAL MENOPAUSE 06/29/2007   Pain in limb 06/29/2007   Personal history presenting hazards to health 06/29/2007   Unspecified glaucoma 04/27/2007   Essential hypertension 04/27/2007   Osteoporosis 04/27/2007     Medication Assistance:  None required.  Patient affirms current coverage meets needs.   Assessment / Plan: Hypertension: not at goal - Continue to take metoprolol  SUCCINATE ER 100mg  ONCE a day.  - Restart amlodipine  5mg  - take 0.5 tablet = 2.5mg  daily  - Continue to check blood pressure daily and record. Nurse Care Manager to follow up 04/29/2024. Patient to check to see if she can get someone to bring her in for blood pressure check in the office.   - Patient to notify office if any dizziness or edema.    Follow up in 1 to 2 weeks.  Cecilie Coffee, PharmD Clinical Pharmacist [facility]  - [facility] [phone]

## 2024-04-29 ENCOUNTER — Ambulatory Visit: Payer: Self-pay

## 2024-04-29 ENCOUNTER — Ambulatory Visit (INDEPENDENT_AMBULATORY_CARE_PROVIDER_SITE_OTHER): Admitting: Family Medicine

## 2024-04-29 ENCOUNTER — Encounter: Payer: Self-pay | Admitting: Family Medicine

## 2024-04-29 ENCOUNTER — Other Ambulatory Visit: Payer: Self-pay

## 2024-04-29 VITALS — BP 144/90 | HR 87 | Temp 98.0°F | Resp 16 | Ht 62.0 in | Wt 110.0 lb

## 2024-04-29 DIAGNOSIS — I10 Essential (primary) hypertension: Secondary | ICD-10-CM | POA: Diagnosis not present

## 2024-04-29 MED ORDER — AMLODIPINE BESYLATE 5 MG PO TABS: 5.0000 mg | ORAL_TABLET | Freq: Every day | ORAL | 2 refills | Status: DC

## 2024-04-29 NOTE — Patient Outreach (Signed)
 Complex Care Management   Visit Note  04/29/2024  Name:  Evelyn [name] MRN: [id number] DOB: [birth year]  Situation: Referral received for Complex Care Management related to Recent Hip Fracture with pinning and HTN  I obtained verbal consent from Patient.  Visit completed with patient  on the phone  Background:   Past Medical History:  Diagnosis Date   Atrial fibrillation (HCC)    Hyperlipidemia    Hypertension    Osteoporosis    Post-menopausal    HRT    Assessment: Patient Reported Symptoms:  Cognitive Cognitive Status: Alert and oriented to person, place, and time      Neurological Neurological Review of Symptoms: No symptoms reported    HEENT HEENT Symptoms Reported: Change or loss of hearing      Cardiovascular Cardiovascular Symptoms Reported: No symptoms reported Does patient have uncontrolled Hypertension?: Yes Is patient checking Blood Pressure at home?: Yes Patient's Recent BP reading at home: 175/95    Respiratory      Endocrine Patient reports the following symptoms related to hypoglycemia or hyperglycemia : No symptoms reported Is patient diabetic?: No Endocrine Conditions: Diabetes  Gastrointestinal Gastrointestinal Symptoms Reported: No symptoms reported      Genitourinary Genitourinary Symptoms Reported: Frequency    Integumentary Integumentary Symptoms Reported: Other Other Integumentary Symptoms: wounds from wart removal have fallen off and healed Skin Management Strategies: Routine screening  Musculoskeletal Additional Musculoskeletal Details: patient continues HEP uses rollator and cane Musculoskeletal Conditions: Mobility limited, Joint pain, Fracture Musculoskeletal Management Strategies: Routine screening, Medical device Musculoskeletal Self-Management Outcome: 4 (good) Falls in the past year?: No Patient at Risk for Falls Due to: Impaired balance/gait, Impaired mobility, Orthopedic patient Fall risk Follow up: Falls evaluation completed,  Education provided, Falls prevention discussed  Psychosocial Psychosocial Symptoms Reported: No symptoms reported            03/05/2024    1:05 PM  Depression screen PHQ 2/9  Decreased Interest 0  Down, Depressed, Hopeless 0  PHQ - 2 Score 0  Altered sleeping 0  Tired, decreased energy 0  Change in appetite 0  Feeling bad or failure about yourself  0  Trouble concentrating 0  Moving slowly or fidgety/restless 0  Suicidal thoughts 0  PHQ-9 Score 0  Difficult doing work/chores Not difficult at all    Vitals:   04/29/24 1333 04/29/24 1353  BP: (!) 175/95 (!) 160/83  Pulse: 82     Medications Reviewed Today     Reviewed by Clarnce Crow, RN (Registered Nurse) on 04/29/24 at 1335  Med List Status: <None>   Medication Order Taking? Sig Documenting Provider Last Dose Status Informant  acetaminophen  (QC ACETAMINOPHEN  8 HOURS) 650 MG CR tablet [id number] Yes Take 650 mg by mouth every 8 (eight) hours as needed for pain. [provider] Taking Active   amLODipine  (NORVASC ) 5 MG tablet [id number] Yes Take 0.5 tablets (2.5 mg total) by mouth daily. Lowne Chase, Yvonne R, DO Taking Active   ascorbic acid (VITAMIN C) 500 MG tablet [id number] Yes Take 500 mg by mouth daily. [provider] Taking Active Self, Pharmacy Records  calcium carbonate (OS-CAL) 600 MG TABS [id number] Yes Take 600 mg by mouth daily. [provider] Taking Active Self, Pharmacy Records  Cholecalciferol (VITAMIN D  PO) [id number] Yes Take 1 capsule by mouth daily. 2000 units [provider] Taking Active Self, Pharmacy Records  ELIQUIS  2.5 MG TABS tablet [id number] Yes TAKE 1 TABLET BY MOUTH TWICE DAILY Wenona Hamilton, MD Taking  Active Self, Pharmacy Records  glucosamine-chondroitin 500-400 MG tablet [id number] Yes Take 1 tablet by mouth daily.  [provider] Taking Active Self, Pharmacy Records  Latanoprostene [place] OP) [id number] Yes Apply 1 drop to eye once. One drop  in each eye per day for glaucoma [provider] Taking Active Self  metoprolol  succinate (TOPROL -XL) 100 MG 24 hr tablet [id number] Yes Take 1 tablet (100 mg total) by mouth daily. Take with or immediately following a meal. Crecencio Dodge, Candida Chalk, DO Taking Active   Red Yeast Rice 600 MG TABS [id number] Yes Take 1 tablet by mouth daily. [provider] Taking Active Self, Pharmacy Records  timolol (TIMOPTIC) 0.5 % ophthalmic solution [id number] Yes 1 drop every morning. [provider] Taking Active Self, Pharmacy Records            Recommendation:   PCP Follow-up  patient will call clinic and schedule Nurse Visit to have BP taken in clinic.  Todays BP reading at home was 175/95, 160/77 after adding amlodipine  on 04/23/24 as per Clinical Pharmacist.    Follow Up Plan:   Telephone follow up appointment date/time:  05/08/24 at 3:00   Clarnce Crow BSN RN CCM [place]  [facility], [facility] RN Care Manager Direct Dial: [phone] Fax: [phone]

## 2024-04-29 NOTE — Telephone Encounter (Signed)
 Copied from CRM [id number]. Topic: Clinical - Red Word Triage >> Apr 29, 2024  2:10 PM Evelyn [name] wrote: Red Word that prompted transfer to Nurse Triage: elevated b/p 176/95   Chief Complaint: BP 176/95, no symptoms Symptoms: above Frequency: today Pertinent Negatives: Patient denies symptoms Disposition: [] ED /[] Urgent Care (no appt availability in office) / [x] Appointment(In office/virtual)/ []  [place] [facility]/ [] Home Care/ [] Refused Recommended Disposition /[] [place] [facility]/ []  Follow-up with PCP Additional Notes: agrees with appointment.  Reason for Disposition  Systolic BP  >= 160 OR Diastolic >= 100  Answer Assessment - Initial Assessment Questions 1. BLOOD PRESSURE: "What is the blood pressure?" "Did you take at least two measurements 5 minutes apart?"     176/95 2. ONSET: "When did you take your blood pressure?"     X 1 week 3. HOW: "How did you take your blood pressure?" (e.g., automatic home BP monitor, visiting nurse)     Pharmacy, nurse 4. HISTORY: "Do you have a history of high blood pressure?"     yes 5. MEDICINES: "Are you taking any medicines for blood pressure?" "Have you missed any doses recently?"     yes 6. OTHER SYMPTOMS: "Do you have any symptoms?" (e.g., blurred vision, chest pain, difficulty breathing, headache, weakness)     No energy 7. PREGNANCY: "Is there any chance you are pregnant?" "When was your last menstrual period?"     no  Protocols used: Blood Pressure - High-A-AH

## 2024-04-29 NOTE — Patient Instructions (Signed)
 Visit Information  Thank you for taking time to visit with me today. Please don't hesitate to contact me if I can be of assistance to you before our next scheduled appointment.  Our next appointment is by telephone on 05/08/24 at 3:00 Please call the care guide team at [phone] if you need to cancel or reschedule your appointment.   Following is a copy of your care plan:   Goals Addressed             This Visit's Progress    VBCI RN Care Plan       Problems:  Chronic Disease Management support and education needs related to HTN  Goal: Over the next 30 days the Patient will attend all scheduled medical appointments: 05/07/24 Pharmacy as evidenced by chart review and patient report        continue to work with RN Care Manager and/or Social Worker to address care management and care coordination needs related to HTN as evidenced by adherence to care management team scheduled appointments     demonstrate Improved adherence to prescribed treatment plan for HTN as evidenced by taking daily blood press and weight readings and recording the results for review with RNCM during net visit.  Take all medication as prescribed.  Continue to work with Clinical Pharmacist to manage high blood pressure.  Interventions:   Hypertension Interventions: Last practice recorded BP readings:  BP Readings from Last 3 Encounters:  04/29/24 (!) 160/83  04/16/24 (!) 155/71  03/29/24 (!) 151/67  As per Clinical Pharmacist note dated 04/23/24, patient has had continued high blood pressure readings, even after addition of amlodipine .  RNCM instructed patient to call clinic to schedule Nurse Visit to confirm BP readings.  Patient was able to arrange for transportation for this visit. Patient denied symptoms of blurry vision, headache, chest pain/discomfort or SOB.     Most recent eGFR/CrCl: No results found for: "EGFR"  No components found for: "CRCL"  Evaluation of current treatment plan related to  hypertension self management and patient's adherence to plan as established by provider Provided education to patient re: stroke prevention, s/s of heart attack and stroke Reviewed medications with patient and discussed importance of compliance Discussed plans with patient for ongoing care management follow up and provided patient with direct contact information for care management team Advised patient, providing education and rationale, to monitor blood pressure daily and record, calling PCP for findings outside established parameters Discussed complications of poorly controlled blood pressure such as heart disease, stroke, circulatory complications, vision complications, kidney impairment, sexual dysfunction  Patient Self-Care Activities:  Attend all scheduled provider appointments Call pharmacy for medication refills 3-7 days in advance of running out of medications Call provider office for new concerns or questions  Perform all self care activities independently  Take medications as prescribed   Work with the pharmacist to address medication management needs and will continue to work with the clinical team to address health care and disease management related needs check blood pressure daily write blood pressure results in a log or diary keep a blood pressure log take blood pressure log to all doctor appointments call doctor for signs and symptoms of high blood pressure keep all doctor appointments take medications for blood pressure exactly as prescribed report new symptoms to your doctor  Plan:  Telephone follow up appointment with care management team member scheduled for:  05/08/24 at 3:00             Please call the Suicide  and Crisis Lifeline: 988 call the [place]  National Suicide Prevention Lifeline: 321 143 4615 or TTY: 514-735-2401 TTY 8704059086) to talk to a trained counselor call 1-800-273-TALK (toll free, 24 hour hotline) if you are experiencing a Mental Health  or Behavioral Health Crisis or need someone to talk to.  The patient verbalized understanding of instructions, educational materials, and care plan provided today and DECLINED offer to receive copy of patient instructions, educational materials, and care plan.    Clarnce Crow BSN RN CCM [place]  [facility], [facility] RN Care Manager Direct Dial: [phone] Fax: [phone]

## 2024-04-29 NOTE — Patient Instructions (Signed)
Check your blood pressures 2-3 times per week, alternating the time of day you check it. If it is high, considering waiting 1-2 minutes and rechecking. If it gets higher, your anxiety is likely creeping up and we should avoid rechecking.   Keep the diet clean and stay active.  Let us know if you need anything.  

## 2024-04-29 NOTE — Progress Notes (Signed)
 Chief Complaint  Patient presents with   Hypertension    BP    Subjective Evelyn [name] is a 89 y.o. female who presents for hypertension follow up. Here w daughter.  Fx'd hip back in Feb of this year.  She does monitor home blood pressures. Blood pressures ranging from 170's/90's on average. She is compliant with medications- Norvasc  2.5 mg/d, Toprol  XL 100 mg/d. Patient has these side effects of medication: none She is usually adhering to a healthy diet overall. Current exercise: working with PT/OT No CP or SOB.    Past Medical History:  Diagnosis Date   Atrial fibrillation (HCC)    Hyperlipidemia    Hypertension    Osteoporosis    Post-menopausal    HRT    Exam BP (!) 144/90 (BP Location: Left Arm, Cuff Size: Normal)   Pulse 87   Temp 98 F (36.7 C) (Oral)   Resp 16   Ht 5\' 2"  (1.575 m)   Wt 110 lb (49.9 kg)   SpO2 96%   BMI 20.12 kg/m  General:  well developed, well nourished, in no apparent distress Heart: RRR, no bruits, no LE edema Lungs: clear to auscultation, no accessory muscle use Psych: well oriented with normal range of affect and appropriate judgment/insight  Essential hypertension - Plan: amLODipine  (NORVASC ) 5 MG tablet  Chronic, not stable.  Increase amlodipine  from 2.5 mg daily to 5 mg daily.  Continue metoprolol  XL 100 mg daily.  Monitor blood pressure at home.  Try to do something calm/relaxing for 5 minutes prior to checking.  Counseled on diet and exercise. F/u in 1 mo w Dr. Jalene Mayor. The patient and her daughter voiced understanding and agreement to the plan.  Shellie Dials [place], DO 04/29/24  3:46 PM

## 2024-05-07 ENCOUNTER — Other Ambulatory Visit: Payer: Self-pay | Admitting: Pharmacist

## 2024-05-07 NOTE — Progress Notes (Signed)
 Pharmacy Note  05/07/2024 Name: Evelyn [name] MRN: [id number] DOB: Oct 26, 1930  Subjective: Evelyn [name] is a 89 y.o. year old female who is a primary care patient of Crecencio Dodge, Candida Chalk, DO. Clinical Pharmacist Practitioner referral was placed to assist with medication management.    Engaged with patient by telephone to follow up.   She fractured her hip Feb 19th 2025. She was in the hospital for about 1 week and then in rehab for about 4 to 6 weeks. Returned home around 02/09/2024. She is not walking on her own yet, using rollator. She also is not driving yet either.   Hypertension Current blood pressure medications: metoprolol  succinate ER 100mg  once a day and amlodipine  5mg  daily   Amlodipine  was restarted 04/23/2024 at 2.5mg  daily, then dose was increased to 5mg  daily 04/29/2024 when she saw Dr Gwenette Lennox  Patient has been checking blood pressure at home. Readings since restarting amlodipine :  05/03/2024 - blood pressure = 147/76 05/04/2024 - blood pressure = 152/74 and HR = 75 05/05/2024 - blood pressure = 138/79 and HR = 80 Today 05/07/2024 - blood pressure = 155/91 and HR = 70  Prior to restarting amlodipine :  5/21: blood pressure = 152/80 and HR = 83 5/22: blood pressure = 146/76 and HR = 96 5/23: blood pressure = 129/62 and HR = 93 5/24: blood pressure = 154/88 and HR = 74 5/25: blood pressure = 156/73 and HR = 83 5/26: blood pressure = 168/88 and HR = 77   BP Readings from Last 3 Encounters:  04/29/24 (!) 144/90  04/29/24 (!) 160/83  04/16/24 (!) 155/71    SDOH (Social Determinants of Health) assessments and interventions performed:  SDOH Interventions    Flowsheet Row Patient Outreach Telephone from 04/29/2024 in [place] [facility] POPULATION HEALTH DEPARTMENT Patient Outreach Telephone from 04/16/2024 in [place] POPULATION HEALTH DEPARTMENT Patient Outreach Telephone from 03/29/2024 in [place] POPULATION HEALTH DEPARTMENT Patient  Outreach Telephone from 03/22/2024 in [place] POPULATION HEALTH DEPARTMENT Patient Outreach from 02/26/2024 in [place] POPULATION HEALTH DEPARTMENT Patient Outreach from 02/19/2024 in [place] POPULATION HEALTH DEPARTMENT  SDOH Interventions        Food Insecurity Interventions Intervention Not Indicated Intervention Not Indicated -- Intervention Not Indicated -- Intervention Not Indicated  Housing Interventions Intervention Not Indicated Intervention Not Indicated -- Intervention Not Indicated -- Intervention Not Indicated  Transportation Interventions -- Intervention Not Indicated -- Intervention Not Indicated -- Intervention Not Indicated  Utilities Interventions -- Intervention Not Indicated Intervention Not Indicated -- Intervention Not Indicated --        Objective: Review of patient status, including review of consultants reports, laboratory and other test data, was performed as part of comprehensive.  Lab Results  Component Value Date   CREATININE 0.64 03/05/2024   CREATININE 0.77 01/23/2024   CREATININE 0.91 01/22/2024    No results found for: "HGBA1C"     Component Value Date/Time   CHOL 177 03/05/2024 1353   TRIG 110.0 03/05/2024 1353   HDL 61.70 03/05/2024 1353   CHOLHDL 3 03/05/2024 1353   VLDL 22.0 03/05/2024 1353   LDLCALC 93 03/05/2024 1353   LDLDIRECT 130.5 03/26/2013 0916     Clinical ASCVD: No  The ASCVD Risk score (Arnett DK, et al., 2019) failed to calculate for the following reasons:   The 2019 ASCVD risk score is only valid for ages 52 to 73    BP Readings from Last 3 Encounters:  04/29/24 (!) 144/90  04/29/24 (!) 160/83  04/16/24 (!) 155/71     Allergies  Allergen Reactions   Vicodin [Hydrocodone-Acetaminophen ] Other (See Comments)    Abdominal Pain   Pletaal [Cilostazol ] Diarrhea   Statins     Medications Reviewed Today     Reviewed by Cecilie Coffee, RPH-CPP (Pharmacist) on 05/07/24 at 1127  Med List Status: <None>   Medication  Order Taking? Sig Documenting Provider Last Dose Status Informant  acetaminophen  (QC ACETAMINOPHEN  8 HOURS) 650 MG CR tablet [id number]  Take 650 mg by mouth every 8 (eight) hours as needed for pain. [provider]  Active   amLODipine  (NORVASC ) 5 MG tablet [id number] Yes Take 1 tablet (5 mg total) by mouth daily. Jobe Mulder, DO Taking Active   ascorbic acid (VITAMIN C) 500 MG tablet [id number] No Take 500 mg by mouth daily.  Patient not taking: Reported on 05/07/2024   [provider] Not Taking Active Self, Pharmacy Records           Med Note [facility], [facility] B   Tue May 07, 2024 11:20 AM) Patient has run out  calcium carbonate (OS-CAL) 600 MG TABS [id number] Yes Take 600 mg by mouth daily. [provider] Taking Active Self, Pharmacy Records  Cholecalciferol (VITAMIN D  PO) [id number] No Take 1 capsule by mouth daily. 2000 units  Patient not taking: Reported on 05/07/2024   [provider] Not Taking Active Self, Pharmacy Records           Med Note [facility], [facility] B   Tue May 07, 2024 11:21 AM) Patient has run out  Co-Enzyme Q10 100 MG CAPS [id number] Yes Take 100 mg by mouth daily. [provider] Taking Active   ELIQUIS  2.5 MG TABS tablet [id number] Yes TAKE 1 TABLET BY MOUTH TWICE DAILY Wenona Hamilton, MD Taking Active Self, Pharmacy Records  glucosamine-chondroitin 500-400 MG tablet [id number] Yes Take 1 tablet by mouth daily.  [provider] Taking Active Self, Pharmacy Records  Latanoprostene Bunod [place] OP) [id number] Yes Apply 1 drop to eye once. One drop in each eye per day for glaucoma [provider] Taking Active Self  Magnesium 400 MG TABS [id number] Yes Take 400 mg by mouth daily. [provider] Taking Active   metoprolol  succinate (TOPROL -XL) 100 MG 24 hr tablet [id number] Yes Take 1 tablet (100 mg total) by mouth daily. Take with or immediately following a meal. Crecencio Dodge, Candida Chalk, DO Taking Active    Red Yeast Rice 600 MG TABS [id number] Yes Take 1 tablet by mouth daily. [provider] Taking Active Self, Pharmacy Records  timolol (TIMOPTIC) 0.5 % ophthalmic solution [id number] Yes 1 drop every morning. [provider] Taking Active Self, Pharmacy Records            Patient Active Problem List   Diagnosis Date Noted   Closed displaced intertrochanteric fracture of left femur (HCC) 01/17/2024   Fall 01/17/2024   Leukocytosis 01/17/2024   Change in bowel function 02/01/2022   Hyperlipidemia 04/14/2020   Seasonal allergies 11/27/2019   Persistent atrial fibrillation (HCC) 03/26/2019   Anticoagulated 03/26/2019   Loose stools 04/14/2015   Osteoarthritis 04/14/2015   Chronic flank pain 06/17/2013   Plantar fasciitis 05/25/2013   PAD (peripheral artery disease) (HCC) 03/07/2013   Acute hip pain, left 02/05/2013   CARDIAC MURMUR 06/29/2010   VARICOSE VEINS, LOWER EXTREMITIES 06/02/2010   PLANTAR WART, LEFT 04/03/2009   Benign neoplasm of skin 04/03/2009   HIP PAIN, RIGHT 04/03/2009  Dyslipidemia, goal LDL below 70 06/29/2007   ARTIFICIAL MENOPAUSE 06/29/2007   Pain in limb 06/29/2007   Personal history presenting hazards to health 06/29/2007   Unspecified glaucoma 04/27/2007   Essential hypertension 04/27/2007   Osteoporosis 04/27/2007     Medication Assistance:  None required.  Patient affirms current coverage meets needs.   Assessment / Plan: Hypertension: not at goal but improving. Not sure if home blood pressure monitor is providing accurate readings. - Continue to take metoprolol  SUCCINATE ER 100mg  ONCE a day and amlodipine  5mg  daily  - Continue to check blood pressure - recommended that she check 3 times with 5 minutes between and average readings.  - I am planning to do a home visit 05/13/2024 to check blood pressure and will do a verification test with her home blood pressure monitor.    Follow up in 1  week  Cecilie Coffee, PharmD Clinical  Pharmacist [facility]  - [facility] [phone]

## 2024-05-08 ENCOUNTER — Other Ambulatory Visit: Payer: Self-pay

## 2024-05-08 NOTE — Patient Instructions (Signed)
 Visit Information  Thank you for taking time to visit with me today. Please don't hesitate to contact me if I can be of assistance to you before our next scheduled appointment.  Our next appointment is by telephone on 05/22/24 at 3:00 Please call the care guide team at [phone] if you need to cancel or reschedule your appointment.   Following is a copy of your care plan:   Goals Addressed             This Visit's Progress    VBCI RN Care Plan   Improving    Problems:  Chronic Disease Management support and education needs related to HTN  Goal: Over the next 30 days the Patient will attend all scheduled medical appointments: 05/13/24 Pharmacy, 05/28/24 PCP as evidenced by chart review and patient report        continue to work with RN Care Manager and/or Social Worker to address care management and care coordination needs related to HTN as evidenced by adherence to care management team scheduled appointments     demonstrate Improved adherence to prescribed treatment plan for HTN as evidenced by taking daily blood press and weight readings and recording the results for review with RNCM during net visit.  Take all medication as prescribed.  Continue to work with Clinical Pharmacist to manage high blood pressure.  Interventions:   Hypertension Interventions: Last practice recorded BP readings:  BP Readings from Last 3 Encounters:  05/08/24 (!) 149/79  04/29/24 (!) 144/90  04/29/24 (!) 160/83  Patient recently had increased dosage of amlodipine  to 5mg  per day, continuing on metoprolol  100 mg, will have home visit from Clinical Pharmacist to check accuracy of home BP cuff. Most recent eGFR/CrCl: No results found for: EGFR  No components found for: CRCL  Evaluation of current treatment plan related to hypertension self management and patient's adherence to plan as established by provider Provided education to patient re: stroke prevention, s/s of heart attack and stroke Reviewed  medications with patient and discussed importance of compliance Discussed plans with patient for ongoing care management follow up and provided patient with direct contact information for care management team Advised patient, providing education and rationale, to monitor blood pressure daily and record, calling PCP for findings outside established parameters Discussed complications of poorly controlled blood pressure such as heart disease, stroke, circulatory complications, vision complications, kidney impairment, sexual dysfunction  Patient Self-Care Activities:  Attend all scheduled provider appointments Call pharmacy for medication refills 3-7 days in advance of running out of medications Call provider office for new concerns or questions  Perform all self care activities independently  Take medications as prescribed   Work with the pharmacist to address medication management needs and will continue to work with the clinical team to address health care and disease management related needs check blood pressure daily write blood pressure results in a log or diary keep a blood pressure log take blood pressure log to all doctor appointments call doctor for signs and symptoms of high blood pressure keep all doctor appointments take medications for blood pressure exactly as prescribed report new symptoms to your doctor  Plan:  Telephone follow up appointment with care management team member scheduled for:  06//25/25 at 3:00             Please call the Suicide and Crisis Lifeline: 988 call the [place]  National Suicide Prevention Lifeline: (770)491-7077 or TTY: 302 705 8245 TTY (914)339-2211) to talk to a trained counselor call 1-800-273-TALK (toll free, 24 hour  hotline) if you are experiencing a Mental Health or Behavioral Health Crisis or need someone to talk to.  The patient verbalized understanding of instructions, educational materials, and care plan provided today and DECLINED  offer to receive copy of patient instructions, educational materials, and care plan.    Clarnce Crow BSN RN CCM [place]  [facility], [facility] Health RN Care Manager Direct Dial: [phone] Fax: [phone]

## 2024-05-08 NOTE — Patient Outreach (Signed)
 Complex Care Management   Visit Note  05/08/2024  Name:  Evelyn [name] MRN: [id number] DOB: January 11, 1930  Situation: Referral received for Complex Care Management related to HTN I obtained verbal consent from Patient.  Visit completed with patient  on the phone  Background:   Past Medical History:  Diagnosis Date   Atrial fibrillation (HCC)    Hyperlipidemia    Hypertension    Osteoporosis    Post-menopausal    HRT    Assessment: Patient Reported Symptoms:  Cognitive Cognitive Status: Alert and oriented to person, place, and time      Neurological Neurological Review of Symptoms: No symptoms reported    HEENT HEENT Symptoms Reported: No symptoms reported HEENT Conditions: Ear problem(s) HEENT Management Strategies: Routine screening Ear problem(s)  Cardiovascular Cardiovascular Symptoms Reported: No symptoms reported Does patient have uncontrolled Hypertension?: Yes Is patient checking Blood Pressure at home?: Yes Patient's Recent BP reading at home: 149/79 Cardiovascular Conditions: High blood cholesterol, Hypertension Cardiovascular Management Strategies: Routine screening, Medication therapy Do You Have a Working Readable Scale?: Yes  Respiratory Respiratory Symptoms Reported: No symptoms reported    Endocrine Patient reports the following symptoms related to hypoglycemia or hyperglycemia : No symptoms reported Is patient diabetic?: No    Gastrointestinal Gastrointestinal Symptoms Reported: No symptoms reported      Genitourinary Genitourinary Symptoms Reported: Frequency Genitourinary Conditions: Frequency Genitourinary Management Strategies: Medication therapy  Integumentary Integumentary Symptoms Reported: No symptoms reported Skin Management Strategies: Routine screening  Musculoskeletal Musculoskelatal Symptoms Reviewed: Difficulty walking, Unsteady gait Additional Musculoskeletal Details: Patient has strengthened significantly, now able to walk/work outdoors  for short periods. Musculoskeletal Conditions: Joint pain, Mobility limited, Fracture, Unsteady gait Musculoskeletal Management Strategies: Medical device, Adequate rest, Activity, Medication therapy, Coping strategies, Exercise, Routine screening Musculoskeletal Self-Management Outcome: 4 (good) Falls in the past year?: No    Psychosocial Psychosocial Symptoms Reported: No symptoms reported            03/05/2024    1:05 PM  Depression screen PHQ 2/9  Decreased Interest 0  Down, Depressed, Hopeless 0  PHQ - 2 Score 0  Altered sleeping 0  Tired, decreased energy 0  Change in appetite 0  Feeling bad or failure about yourself  0  Trouble concentrating 0  Moving slowly or fidgety/restless 0  Suicidal thoughts 0  PHQ-9 Score 0  Difficult doing work/chores Not difficult at all    Vitals:   05/08/24 1459  BP: (!) 149/79  Pulse: 84    Medications Reviewed Today     Reviewed by Clarnce Crow, RN (Registered Nurse) on 05/08/24 at 1506  Med List Status: <None>   Medication Order Taking? Sig Documenting Provider Last Dose Status Informant  acetaminophen  (QC ACETAMINOPHEN  8 HOURS) 650 MG CR tablet [id number] Yes Take 650 mg by mouth every 8 (eight) hours as needed for pain. [provider] Taking Active   amLODipine  (NORVASC ) 5 MG tablet [id number] Yes Take 1 tablet (5 mg total) by mouth daily. Jobe Mulder, DO Taking Active   ascorbic acid (VITAMIN C) 500 MG tablet [id number] Yes Take 500 mg by mouth daily. [provider] Taking Active Self, Pharmacy Records           Med Note [facility], [facility] B   Tue May 07, 2024 11:20 AM) Patient has run out  calcium carbonate (OS-CAL) 600 MG TABS [id number] Yes Take 600 mg by mouth daily. [provider] Taking Active Self, Pharmacy Records  Cholecalciferol (VITAMIN D  PO) [id number] Yes  Take 1 capsule by mouth daily. 2000 units [provider] Taking Active Self, Pharmacy Records           Med Note [facility],  [facility] B   Tue May 07, 2024 11:21 AM) Patient has run out  Co-Enzyme Q10 100 MG CAPS [id number] Yes Take 100 mg by mouth daily. [provider] Taking Active   ELIQUIS  2.5 MG TABS tablet [id number] Yes TAKE 1 TABLET BY MOUTH TWICE DAILY Wenona Hamilton, MD Taking Active Self, Pharmacy Records  glucosamine-chondroitin 500-400 MG tablet [id number] Yes Take 1 tablet by mouth daily.  [provider] Taking Active Self, Pharmacy Records  Latanoprostene Bunod [place] OP) [id number] Yes Apply 1 drop to eye once. One drop in each eye per day for glaucoma [provider] Taking Active Self  Magnesium 400 MG TABS [id number]  Take 400 mg by mouth daily. [provider]  Active   metoprolol  succinate (TOPROL -XL) 100 MG 24 hr tablet [id number] Yes Take 1 tablet (100 mg total) by mouth daily. Take with or immediately following a meal. Crecencio Dodge, Candida Chalk, DO Taking Active   Red Yeast Rice 600 MG TABS [id number] Yes Take 1 tablet by mouth daily. [provider] Taking Active Self, Pharmacy Records  timolol (TIMOPTIC) 0.5 % ophthalmic solution [id number] Yes 1 drop every morning. [provider] Taking Active Self, Pharmacy Records            Recommendation:   Continue Current Plan of Care  Follow Up Plan:   Telephone follow up appointment date/time:  05/22/24   Clarnce Crow BSN RN CCM [place]  [facility], [facility] RN Care Manager Direct Dial: [phone] Fax: [phone]

## 2024-05-13 ENCOUNTER — Other Ambulatory Visit (INDEPENDENT_AMBULATORY_CARE_PROVIDER_SITE_OTHER)

## 2024-05-13 DIAGNOSIS — I10 Essential (primary) hypertension: Secondary | ICD-10-CM

## 2024-05-13 NOTE — Progress Notes (Signed)
 Pharmacy Note  05/13/2024 Name: Evelyn [name] MRN: [id number] DOB: 1930-09-27  Subjective: Evelyn [name] is a 89 y.o. year old female who is a primary care patient of Crecencio Dodge, Candida Chalk, DO. Clinical Pharmacist Practitioner referral was placed to assist with medication management.    Engaged with patient face to face to follow up (Home visit to check patient's home [facility])  She fractured her hip Feb 19th 2025. She was in the hospital for about 1 week and then in rehab for about 4 to 6 weeks. Returned home around 02/09/2024. She is not walking on her own yet, using rollator. She also is not driving yet either.   Hypertension Current blood pressure medications: metoprolol  succinate ER 100mg  once a day and amlodipine  5mg  daily   Amlodipine  was restarted 04/23/2024 at 2.5mg  daily, then dose was increased to 5mg  daily 04/29/2024 when she saw Dr Gwenette Lennox due to elevated blood pressure.   Patient has been checking blood pressure at home. Blood pressure readings since increasing  6/8 blood pressure = 138/79 and HR = 80 6/10 blood pressure = 161/83 and HR = 79 06/11 blood pressure = 149/79 and HR = 84 6/12 blood pressure = 138/79 and HR = 79 6/13 blood pressure = 130/71 and HR = 82 06/15 blood pressure = 146/86 and HR = 73   Readings since restarting amlodipine :  05/03/2024 - blood pressure = 147/76 05/04/2024 - blood pressure = 152/74 and HR = 75 05/05/2024 - blood pressure = 138/79 and HR = 80 Today 05/07/2024 - blood pressure = 155/91 and HR = 70  Prior to restarting amlodipine :  5/21: blood pressure = 152/80 and HR = 83 5/22: blood pressure = 146/76 and HR = 96 5/23: blood pressure = 129/62 and HR = 93 5/24: blood pressure = 154/88 and HR = 74 5/25: blood pressure = 156/73 and HR = 83 5/26: blood pressure = 168/88 and HR = 77   BP Readings from Last 3 Encounters:  05/13/24 131/76  05/08/24 (!) 149/79  04/29/24 (!) 144/90    SDOH  (Social Determinants of Health) assessments and interventions performed:  SDOH Interventions    Flowsheet Row Patient Outreach Telephone from 04/29/2024 in [place] [facility] Patient Outreach Telephone from 04/16/2024 in [place] POPULATION HEALTH DEPARTMENT Patient Outreach Telephone from 03/29/2024 in [place] POPULATION HEALTH DEPARTMENT Patient Outreach Telephone from 03/22/2024 in [place] POPULATION HEALTH DEPARTMENT Patient Outreach from 02/26/2024 in [place] POPULATION HEALTH DEPARTMENT Patient Outreach from 02/19/2024 in [place] POPULATION HEALTH DEPARTMENT  SDOH Interventions        Food Insecurity Interventions Intervention Not Indicated Intervention Not Indicated -- Intervention Not Indicated -- Intervention Not Indicated  Housing Interventions Intervention Not Indicated Intervention Not Indicated -- Intervention Not Indicated -- Intervention Not Indicated  Transportation Interventions -- Intervention Not Indicated -- Intervention Not Indicated -- Intervention Not Indicated  Utilities Interventions -- Intervention Not Indicated Intervention Not Indicated -- Intervention Not Indicated --     Objective: Review of patient status, including review of consultants reports, laboratory and other test data, was performed as part of comprehensive.  Lab Results  Component Value Date   CREATININE 0.64 03/05/2024   CREATININE 0.77 01/23/2024   CREATININE 0.91 01/22/2024    No results found for: HGBA1C     Component Value Date/Time   CHOL 177 03/05/2024 1353   TRIG 110.0 03/05/2024 1353   HDL 61.70 03/05/2024 1353   CHOLHDL 3 03/05/2024  1353   VLDL 22.0 03/05/2024 1353   LDLCALC 93 03/05/2024 1353   LDLDIRECT 130.5 03/26/2013 0916     Clinical ASCVD: No  The ASCVD Risk score (Arnett DK, et al., 2019) failed to calculate for the following reasons:   The 2019 ASCVD risk score is only valid for ages 73 to 36    BP Readings from Last 3 Encounters:   05/13/24 131/76  05/08/24 (!) 149/79  04/29/24 (!) 144/90     Allergies  Allergen Reactions   Vicodin [Hydrocodone-Acetaminophen ] Other (See Comments)    Abdominal Pain   Pletaal [Cilostazol ] Diarrhea   Statins     Medications Reviewed Today     Reviewed by Cecilie Coffee, RPH-CPP (Pharmacist) on 05/13/24 at 1608  Med List Status: <None>   Medication Order Taking? Sig Documenting Provider Last Dose Status Informant  acetaminophen  (QC ACETAMINOPHEN  8 HOURS) 650 MG CR tablet [id number] No Take 650 mg by mouth every 8 (eight) hours as needed for pain. [provider] Taking Active   amLODipine  (NORVASC ) 5 MG tablet [id number] No Take 1 tablet (5 mg total) by mouth daily. Jobe Mulder, DO Taking Active   ascorbic acid (VITAMIN C) 500 MG tablet [id number] No Take 500 mg by mouth daily. [provider] Taking Active Self, Pharmacy Records           Med Note [facility], [facility]   Tue May 07, 2024 11:20 AM) Patient has run out  calcium carbonate (OS-CAL) 600 MG TABS [id number] No Take 600 mg by mouth daily. [provider] Taking Active Self, Pharmacy Records  Cholecalciferol (VITAMIN D  PO) [id number] No Take 1 capsule by mouth daily. 2000 units [provider] Taking Active Self, Pharmacy Records           Med Note [facility], [facility]   Tue May 07, 2024 11:21 AM) Patient has run out  Co-Enzyme Q10 100 MG CAPS [id number] No Take 100 mg by mouth daily. [provider] Taking Active   ELIQUIS  2.5 MG TABS tablet [id number] No TAKE 1 TABLET BY MOUTH TWICE DAILY Wenona Hamilton, MD Taking Active Self, Pharmacy Records  glucosamine-chondroitin 500-400 MG tablet [id number] No Take 1 tablet by mouth daily.  [provider] Taking Active Self, Pharmacy Records  Latanoprostene Bunod [place] OP) [id number] No Apply 1 drop to eye once. One drop in each eye per day for glaucoma [provider] Taking Active Self  Magnesium 400 MG TABS [id number]  No Take 400 mg by mouth daily. [provider] Taking Active   metoprolol  succinate (TOPROL -XL) 100 MG 24 hr tablet [id number] No Take 1 tablet (100 mg total) by mouth daily. Take with or immediately following a meal. Crecencio Dodge, Candida Chalk, DO Taking Active   Red Yeast Rice 600 MG TABS [id number] No Take 1 tablet by mouth daily. [provider] Taking Active Self, Pharmacy Records  timolol (TIMOPTIC) 0.5 % ophthalmic solution [id number] No 1 drop every morning. [provider] Taking Active Self, Pharmacy Records            Patient Active Problem List   Diagnosis Date Noted   Closed displaced intertrochanteric fracture of left femur (HCC) 01/17/2024   Fall 01/17/2024   Leukocytosis 01/17/2024   Change in bowel function 02/01/2022   Hyperlipidemia 04/14/2020   Seasonal allergies 11/27/2019   Persistent atrial fibrillation (HCC) 03/26/2019   Anticoagulated 03/26/2019   Loose stools 04/14/2015   Osteoarthritis 04/14/2015   Chronic flank pain 06/17/2013  Plantar fasciitis 05/25/2013   PAD (peripheral artery disease) (HCC) 03/07/2013   Acute hip pain, left 02/05/2013   CARDIAC MURMUR 06/29/2010   VARICOSE VEINS, LOWER EXTREMITIES 06/02/2010   PLANTAR WART, LEFT 04/03/2009   Benign neoplasm of skin 04/03/2009   HIP PAIN, RIGHT 04/03/2009   Dyslipidemia, goal LDL below 70 06/29/2007   ARTIFICIAL MENOPAUSE 06/29/2007   Pain in limb 06/29/2007   Personal history presenting hazards to health 06/29/2007   Unspecified glaucoma 04/27/2007   Essential hypertension 04/27/2007   Osteoporosis 04/27/2007     Medication Assistance:  None required.  Patient affirms current coverage meets needs.   Assessment / Plan: Hypertension: blood pressure was good today with her monitor and was close to the clinic monitor I brought today.  - Continue to take metoprolol  SUCCINATE ER 100mg  ONCE a day and amlodipine  5mg  daily  - Continue to check blood pressure - recommended that  she check 3 times with 5 minutes between and average readings.   Follow up in2 to 4 weeks.  Cecilie Coffee, PharmD Clinical Pharmacist [facility]  - [facility] [phone]

## 2024-05-14 ENCOUNTER — Ambulatory Visit (INDEPENDENT_AMBULATORY_CARE_PROVIDER_SITE_OTHER)

## 2024-05-14 VITALS — Ht 62.0 in | Wt 110.0 lb

## 2024-05-14 DIAGNOSIS — Z0000 Encounter for general adult medical examination without abnormal findings: Secondary | ICD-10-CM | POA: Diagnosis not present

## 2024-05-14 NOTE — Patient Instructions (Signed)
 Ms. Oguin , Thank you for taking time out of your busy schedule to complete your Annual Wellness Visit with me. I enjoyed our conversation and look forward to speaking with you again next year. I, as well as your care team,  appreciate your ongoing commitment to your health goals. Please review the following plan we discussed and let me know if I can assist you in the future. Your Game plan/ To Do List    Follow up Visits: Next Medicare AWV with our clinical staff: In 1 year    Have you seen your provider in the last 6 months (3 months if uncontrolled diabetes)? Yes Next Office Visit with your provider: 05/28/24 @ 1:00  Clinician Recommendations:  Aim for 30 minutes of exercise or brisk walking, 6-8 glasses of water, and 5 servings of fruits and vegetables each day.       This is a list of the screening recommended for you and due dates:  Health Maintenance  Topic Date Due   COVID-19 Vaccine (5 - 2024-25 season) 10/29/2024*   Mammogram  03/05/2025*   Flu Shot  06/28/2024   Medicare Annual Wellness Visit  05/14/2025   DTaP/Tdap/Td vaccine (4 - Td or Tdap) 02/07/2033   Pneumococcal Vaccine for age over 20  Completed   DEXA scan (bone density measurement)  Completed   Zoster (Shingles) Vaccine  Completed   HPV Vaccine  Aged Out   Meningitis B Vaccine  Aged Out  *Topic was postponed. The date shown is not the original due date.    Advanced directives: (ACP Link)Information on Advanced Care Planning can be found at [place]  Secretary of [facility] Advance Health Care Directives Advance Health Care Directives. [email or web address]   Advance Care Planning is important because it:  [x]  Makes sure you receive the medical care that is consistent with your values, goals, and preferences  [x]  It provides guidance to your family and loved ones and reduces their decisional burden about whether or not they are making the right decisions based on your wishes.  Follow the link provided in your after visit  summary or read over the paperwork we have mailed to you to help you started getting your Advance Directives in place. If you need assistance in completing these, please reach out to us  so that we can help you!  See attachments for Preventive Care and Fall Prevention Tips.

## 2024-05-14 NOTE — Progress Notes (Signed)
 Subjective:   Evelyn [name] is a 89 y.o. who presents for a Medicare Wellness preventive visit.  As a reminder, Annual Wellness Visits don't include a physical exam, and some assessments may be limited, especially if this visit is performed virtually. We may recommend an in-person follow-up visit with your provider if needed.  Visit Complete: Virtual I connected with  Briant Camper on 05/14/24 by a audio enabled telemedicine application and verified that I am speaking with the correct person using two identifiers.  Patient Location: Home  Provider Location: Home Office  I discussed the limitations of evaluation and management by telemedicine. The patient expressed understanding and agreed to proceed.  Vital Signs: Because this visit was a virtual/telehealth visit, some criteria may be missing or patient reported. Any vitals not documented were not able to be obtained and vitals that have been documented are patient reported.  VideoDeclined- This patient declined Librarian, academic. Therefore the visit was completed with audio only.  Persons Participating in Visit: Patient.  AWV Questionnaire: No: Patient Medicare AWV questionnaire was not completed prior to this visit.  Cardiac Risk Factors include: advanced age (>57men, >77 women);dyslipidemia;hypertension     Objective:    Today's Vitals   05/14/24 1446  Weight: 110 lb (49.9 kg)  Height: 5' 2 (1.575 m)   Body mass index is 20.12 kg/m.     05/14/2024    3:37 PM 01/19/2024    3:57 PM 01/17/2024    6:12 PM 02/22/2023    2:22 PM 09/21/2021    9:46 AM 03/31/2016   11:42 AM 12/29/2015    4:01 PM  Advanced Directives  Does Patient Have a Medical Advance Directive? No  No Yes Yes Yes  No   Type of Aeronautical engineer of [place];Living will Healthcare Power of [place];Living will Healthcare Power of [place];Living will    Does patient want to make changes to medical advance  directive?    No - Patient declined  No - Patient declined    Copy of Healthcare Power of Attorney in Chart?    No - copy requested No - copy requested No - copy requested    Would patient like information on creating a medical advance directive? Yes (MAU/Ambulatory/Procedural Areas - Information given) No - Patient declined     No - patient declined information      Data saved with a previous flowsheet row definition    Current Medications (verified) Outpatient Encounter Medications as of 05/14/2024  Medication Sig   acetaminophen  (QC ACETAMINOPHEN  8 HOURS) 650 MG CR tablet Take 650 mg by mouth every 8 (eight) hours as needed for pain.   amLODipine  (NORVASC ) 5 MG tablet Take 1 tablet (5 mg total) by mouth daily.   ascorbic acid (VITAMIN C) 500 MG tablet Take 500 mg by mouth daily.   calcium carbonate (OS-CAL) 600 MG TABS Take 600 mg by mouth daily.   Cholecalciferol (VITAMIN D  PO) Take 1 capsule by mouth daily. 2000 units   Co-Enzyme Q10 100 MG CAPS Take 100 mg by mouth daily.   ELIQUIS  2.5 MG TABS tablet TAKE 1 TABLET BY MOUTH TWICE DAILY   glucosamine-chondroitin 500-400 MG tablet Take 1 tablet by mouth daily.    Latanoprostene Bunod (VYZULTA OP) Apply 1 drop to eye once. One drop in each eye per day for glaucoma   Magnesium 400 MG TABS Take 400 mg by mouth daily.   metoprolol  succinate (TOPROL -XL) 100  MG 24 hr tablet Take 1 tablet (100 mg total) by mouth daily. Take with or immediately following a meal.   Red Yeast Rice 600 MG TABS Take 1 tablet by mouth daily.   timolol (TIMOPTIC) 0.5 % ophthalmic solution 1 drop every morning.   No facility-administered encounter medications on file as of 05/14/2024.    Allergies (verified) Vicodin [hydrocodone-acetaminophen ], Pletaal [cilostazol ], and Statins   History: Past Medical History:  Diagnosis Date   Atrial fibrillation (HCC)    Hyperlipidemia    Hypertension    Osteoporosis    Post-menopausal    HRT   Past Surgical History:   Procedure Laterality Date   ABDOMINAL HYSTERECTOMY     BREAST LUMPECTOMY  11/28/1973   EYE SURGERY     Cataract removal   FEMUR IM NAIL Left 01/18/2024   Procedure: LEFT FEMUR CEPHALOMEDULLARY NAIL;  Surgeon: Marilee Showers, MD;  Location: [facility] OR;  Service: Orthopedics;  Laterality: Left;  Hana table, C-arm, Synthes   Family History  Problem Relation Age of Onset   Arthritis Mother    Heart disease Mother    Heart attack Father 55   Prostate cancer Father    COPD Father    Glaucoma Father    Hypertension Sister    COPD Sister    Arthritis Sister    Social History   Socioeconomic History   Marital status: Widowed    Spouse name: Not on file   Number of children: Not on file   Years of education: Not on file   Highest education level: Not on file  Occupational History   Not on file  Tobacco Use   Smoking status: Former    Current packs/day: 0.00    Average packs/day: 0.8 packs/day for 18.0 years (13.5 ttl pk-yrs)    Types: Cigarettes    Start date: 02/21/1955    Quit date: 02/20/1973    Years since quitting: 51.2   Smokeless tobacco: Never  Substance and Sexual Activity   Alcohol use: Yes   Drug use: No   Sexual activity: Not Currently    Partners: Male  Other Topics Concern   Not on file  Social History Narrative   Exercise--yard work, house work   Social Drivers of Corporate investment banker Strain: Low Risk  (05/14/2024)   Overall Financial Resource Strain (CARDIA)    Difficulty of Paying Living Expenses: Not hard at all  Food Insecurity: No Food Insecurity (05/14/2024)   Hunger Vital Sign    Worried About Running Out of Food in the Last Year: Never true    Ran Out of Food in the Last Year: Never true  Transportation Needs: No Transportation Needs (05/14/2024)   PRAPARE - Administrator, Civil Service (Medical): No    Lack of Transportation (Non-Medical): No  Physical Activity: Inactive (05/14/2024)   Exercise Vital Sign    Days of Exercise per  Week: 0 days    Minutes of Exercise per Session: 0 min  Stress: No Stress Concern Present (05/14/2024)   [employer] of Occupational Health - Occupational Stress Questionnaire    Feeling of Stress: Not at all  Social Connections: Moderately Integrated (05/14/2024)   Social Connection and Isolation Panel    Frequency of Communication with Friends and Family: More than three times a week    Frequency of Social Gatherings with Friends and Family: Once a week    Attends Religious Services: More than 4 times per year    Active Member  of Clubs or Organizations: Yes    Attends Banker Meetings: More than 4 times per year    Marital Status: Widowed    Tobacco Counseling Counseling given: Not Answered    Clinical Intake:  Pre-visit preparation completed: Yes  Pain : No/denies pain  Diabetes: No   How often do you need to have someone help you when you read instructions, pamphlets, or other written materials from your doctor or pharmacy?: 1 - Never  Interpreter Needed?: No  Information entered by :: Seabron Cypress LPN   Activities of Daily Living     05/14/2024    3:37 PM 01/18/2024    4:00 PM  In your present state of health, do you have any difficulty performing the following activities:  Hearing? 0 0  Vision? 0 0  Difficulty concentrating or making decisions? 0 0  Walking or climbing stairs? 1   Dressing or bathing? 0   Doing errands, shopping? 0 0  Preparing Food and eating ? N   Using the Toilet? N   In the past six months, have you accidently leaked urine? N   Do you have problems with loss of bowel control? N   Managing your Medications? N   Managing your Finances? N   Housekeeping or managing your Housekeeping? N     Patient Care Team: Crecencio Dodge, Candida Chalk, DO as PCP - General Wenona Hamilton, MD as PCP - Cardiology (Cardiology) Altamease Asters, DO as Consulting Physician (Osteopathic Medicine) Cecilie Coffee, RPH-CPP (Pharmacist) Denman Fischer,  MD (Dermatology) Clarnce Crow, RN as VBCI Care Management (General Practice) Clarnce Crow, RN  I have updated your Care Teams any recent Medical Services you may have received from other providers in the past year.     Assessment:   This is a routine wellness examination for Lorriann Hansmann.  Hearing/Vision screen Hearing Screening - Comments:: Denies hearing difficulties     Goals Addressed             This Visit's Progress    Remain active and independent   On track      Depression Screen     05/14/2024    3:36 PM 03/05/2024    1:05 PM 02/22/2023    2:37 PM 09/21/2021    9:50 AM 04/15/2021    1:24 PM 10/08/2019    1:15 PM 09/04/2018   11:06 AM  PHQ 2/9 Scores  PHQ - 2 Score 0 0 0 0 0 0 0  PHQ- 9 Score  0         Fall Risk     05/14/2024    3:37 PM 05/08/2024    3:30 PM 04/29/2024    1:48 PM 04/16/2024    1:26 PM 03/29/2024    2:00 PM  Fall Risk   Falls in the past year? 0 0 0 0 0  Number falls in past yr: 0   0   Injury with Fall? 0      Risk for fall due to : No Fall Risks;History of fall(s);Impaired mobility  Impaired balance/gait;Impaired mobility;Orthopedic patient Impaired mobility;Impaired balance/gait History of fall(s);Impaired balance/gait;Impaired mobility  Follow up Falls prevention discussed;Education provided;Falls evaluation completed  Falls evaluation completed;Education provided;Falls prevention discussed Falls prevention discussed;Education provided Falls prevention discussed;Education provided    MEDICARE RISK AT HOME:  Medicare Risk at Home Any stairs in or around the home?: No If so, are there any without handrails?: No Home free of loose throw rugs in walkways, pet beds,  electrical cords, etc?: Yes Adequate lighting in your home to reduce risk of falls?: Yes Life alert?: No Use of a cane, walker or w/c?: Yes Grab bars in the bathroom?: Yes Shower chair or bench in shower?: No Elevated toilet seat or a handicapped toilet?: Yes  TIMED UP AND  GO:  Was the test performed?  No  Cognitive Function: 6CIT completed    02/22/2023    2:57 PM  MMSE - Mini Mental State Exam  Not completed: Unable to complete        05/14/2024    3:37 PM  6CIT Screen  What Year? 0 points  What month? 0 points  What time? 0 points  Count back from 20 0 points  Months in reverse 0 points  Repeat phrase 0 points  Total Score 0 points    Immunizations Immunization History  Administered Date(s) Administered   Fluad Quad(high Dose 65+) 10/08/2019, 10/06/2020, 10/07/2021, 10/03/2022   Fluad Trivalent(High Dose 65+) 09/05/2023   Influenza Split 09/15/2011, 09/20/2012   Influenza Whole 11/28/2000, 10/10/2007, 08/20/2008, 09/24/2009, 08/25/2010   Influenza, High Dose Seasonal PF 10/01/2015, 09/29/2016, 09/04/2018   Influenza,inj,Quad PF,6+ Mos 08/15/2013, 10/07/2014   PFIZER Comirnaty(Gray Top)Covid-19 Tri-Sucrose Vaccine 04/15/2021   PFIZER(Purple Top)SARS-COV-2 Vaccination 01/09/2020, 02/16/2020, 09/16/2020   Pneumococcal Conjugate-13 10/07/2014   Pneumococcal Polysaccharide-23 11/28/2000   Td 11/28/2000   Tdap 08/05/2011, 02/08/2023   Zoster Recombinant(Shingrix) 04/23/2019, 06/22/2019   Zoster, Live 12/28/2007    Screening Tests Health Maintenance  Topic Date Due   COVID-19 Vaccine (5 - 2024-25 season) 10/29/2024 (Originally 07/30/2023)   MAMMOGRAM  03/05/2025 (Originally 10/15/2015)   INFLUENZA VACCINE  06/28/2024   Medicare Annual Wellness (AWV)  05/14/2025   DTaP/Tdap/Td (4 - Td or Tdap) 02/07/2033   Pneumococcal Vaccine: 50+ Years  Completed   DEXA SCAN  Completed   Zoster Vaccines- Shingrix  Completed   HPV VACCINES  Aged Out   Meningococcal B Vaccine  Aged Out    Health Maintenance  There are no preventive care reminders to display for this patient.   Additional Screening:  Vision Screening: Recommended annual ophthalmology exams for early detection of glaucoma and other disorders of the eye. Would you like a referral  to an eye doctor? No    Dental Screening: Recommended annual dental exams for proper oral hygiene  Community Resource Referral / Chronic Care Management: CRR required this visit?  No   CCM required this visit?  No   Plan:    I have personally reviewed and noted the following in the patient's chart:   Medical and social history Use of alcohol, tobacco or illicit drugs  Current medications and supplements including opioid prescriptions. Patient is not currently taking opioid prescriptions. Functional ability and status Nutritional status Physical activity Advanced directives List of other physicians Hospitalizations, surgeries, and ER visits in previous 12 months Vitals Screenings to include cognitive, depression, and falls Referrals and appointments  In addition, I have reviewed and discussed with patient certain preventive protocols, quality metrics, and best practice recommendations. A written personalized care plan for preventive services as well as general preventive health recommendations were provided to patient.   Seabron Cypress [place], [place]   [id number]   After Visit Summary: (MyChart) Due to this being a telephonic visit, the after visit summary with patients personalized plan was offered to patient via MyChart   Notes: Nothing significant to report at this time.

## 2024-05-22 ENCOUNTER — Other Ambulatory Visit: Payer: Self-pay

## 2024-05-22 NOTE — Patient Instructions (Signed)
 Visit Information  Thank you for taking time to visit with me today. Please don't hesitate to contact me if I can be of assistance to you before our next scheduled appointment.  Our next appointment is by telephone on 06/07/24 at 3:00 Please call the care guide team at [phone] if you need to cancel or reschedule your appointment.   Following is a copy of your care plan:   Goals Addressed             This Visit's Progress    VBCI RN Care Plan   Improving    Problems:  Chronic Disease Management support and education needs related to HTN  Goal: Over the next 30 days the Patient will attend all scheduled medical appointments: 06/12/24 Pharmacy, 05/28/24 PCP as evidenced by chart review and patient report        continue to work with RN Care Manager and/or Social Worker to address care management and care coordination needs related to HTN as evidenced by adherence to care management team scheduled appointments     demonstrate Improved adherence to prescribed treatment plan for HTN as evidenced by taking daily blood press and weight readings and recording the results for review with RNCM during net visit.  Take all medication as prescribed.  Continue to work with Clinical Pharmacist to manage high blood pressure. Patient BP readings significantly improved with change in dosage of medications. Interventions:   Hypertension Interventions: Last practice recorded BP readings:  BP Readings from Last 3 Encounters:  05/22/24 114/70  05/13/24 131/76  05/08/24 (!) 149/79  Patient recently had increased dosage of amlodipine  to 5mg  per day, continuing on metoprolol  100 mg, will have home visit from Clinical Pharmacist to check accuracy of home BP cuff. Most recent eGFR/CrCl: No results found for: EGFR  No components found for: CRCL  Evaluation of current treatment plan related to hypertension self management and patient's adherence to plan as established by provider Provided education to  patient re: stroke prevention, s/s of heart attack and stroke Reviewed medications with patient and discussed importance of compliance Discussed plans with patient for ongoing care management follow up and provided patient with direct contact information for care management team Advised patient, providing education and rationale, to monitor blood pressure daily and record, calling PCP for findings outside established parameters Discussed complications of poorly controlled blood pressure such as heart disease, stroke, circulatory complications, vision complications, kidney impairment, sexual dysfunction  Patient Self-Care Activities:  Attend all scheduled provider appointments Call pharmacy for medication refills 3-7 days in advance of running out of medications Call provider office for new concerns or questions  Perform all self care activities independently  Take medications as prescribed   Work with the pharmacist to address medication management needs and will continue to work with the clinical team to address health care and disease management related needs check blood pressure daily write blood pressure results in a log or diary keep a blood pressure log take blood pressure log to all doctor appointments call doctor for signs and symptoms of high blood pressure keep all doctor appointments take medications for blood pressure exactly as prescribed report new symptoms to your doctor  Plan:  Telephone follow up appointment with care management team member scheduled for:  07/11/25at 3:00             Please call the Suicide and Crisis Lifeline: 988 call the [place]  National Suicide Prevention Lifeline: (863)179-2858 or TTY: 772-157-0408 TTY (737)634-4821) to talk to a trained  counselor call 1-800-273-TALK (toll free, 24 hour hotline) if you are experiencing a Mental Health or Behavioral Health Crisis or need someone to talk to.  The patient verbalized understanding of instructions,  educational materials, and care plan provided today and DECLINED offer to receive copy of patient instructions, educational materials, and care plan.   Olam Idol BSN RN CCM [place]  [facility], [facility] RN Care Manager Direct Dial: [phone] Fax: [phone]

## 2024-05-28 ENCOUNTER — Ambulatory Visit: Admitting: Family Medicine

## 2024-06-03 ENCOUNTER — Other Ambulatory Visit: Payer: Self-pay | Admitting: Cardiovascular Disease

## 2024-06-03 ENCOUNTER — Telehealth: Payer: Self-pay | Admitting: Cardiovascular Disease

## 2024-06-03 DIAGNOSIS — I4821 Permanent atrial fibrillation: Secondary | ICD-10-CM

## 2024-06-03 NOTE — Telephone Encounter (Signed)
*  STAT* If patient is at the pharmacy, call can be transferred to refill team.   1. Which medications need to be refilled? (please list name of each medication and dose if known) ELIQUIS  2.5 MG TABS tablet    2. Would you like to learn more about the convenience, safety, & potential cost savings by using the [facility]?     3. Are you open to using the [facility] (Type [facility].  ).   4. Which pharmacy/location (including street and city if local pharmacy) is medication to be sent to?  [id number] - [place], [place] - [address] AT [facility] [address]     5. Do they need a 30 day or 90 day supply? 90 day

## 2024-06-04 ENCOUNTER — Other Ambulatory Visit (HOSPITAL_COMMUNITY): Payer: Self-pay

## 2024-06-04 MED ORDER — APIXABAN 2.5 MG PO TABS
2.5000 mg | ORAL_TABLET | Freq: Two times a day (BID) | ORAL | 1 refills | Status: DC
  Filled 2024-06-04: qty 180, 90d supply, fill #0

## 2024-06-04 MED ORDER — APIXABAN 2.5 MG PO TABS: 2.5000 mg | ORAL_TABLET | Freq: Two times a day (BID) | ORAL | 1 refills | Status: DC

## 2024-06-04 NOTE — Addendum Note (Signed)
 Addended by: ROBYNN OLAM HERO on: 06/04/2024 08:41 AM   Modules accepted: Orders

## 2024-06-04 NOTE — Telephone Encounter (Signed)
 Prescription refill request for Eliquis  received. Indication: AF Last office visit: 10/10/23  CHRISTELLA Cage MD Scr: 0.64 on 03/05/24  Epic Age: 89 Weight: 51.3kg  Based on above findings Eliquis  2.5mg  twice daily is the appropriate dose.  Refill approved.

## 2024-06-04 NOTE — Telephone Encounter (Signed)
 Sent to wrong pharmacy.  Resent to [facility].

## 2024-06-07 ENCOUNTER — Other Ambulatory Visit: Payer: Self-pay

## 2024-06-07 ENCOUNTER — Telehealth: Payer: Self-pay | Admitting: Pharmacist

## 2024-06-07 NOTE — Telephone Encounter (Signed)
 Patient left a message on VM of Clinical Pharmacist Practitioner. She reports that [facility] is going to charge her $6 to deliver he medications.  She also said the this time the cost of Eliquis  has increased to $147 for a 90 day supply. It looks like last Rx was sent in for 90 day supply and cost is $141 - she can request only 30 days and cost would be $47 again.  Called patient - she had already received delivery form [facility] an hour after she called me. She would like to consider using [facility] in the future and have it delivered to her home. She will review her medications and we will discuss further at our appointment next week 7/16.

## 2024-06-07 NOTE — Patient Instructions (Signed)
 Visit Information  Thank you for taking time to visit with me today. Please don't hesitate to contact me if I can be of assistance to you before our next scheduled appointment.  Our next appointment is please call me to reschedule next visit on  at  Please call the care guide team at [phone] if you need to cancel or reschedule your appointment.   Following is a copy of your care plan:   Goals Addressed             This Visit's Progress    VBCI RN Care Plan   On track    Problems:  Chronic Disease Management support and education needs related to HTN  Goal: Over the next 30 days the Patient will attend all scheduled medical appointments: 06/12/24 Pharmacy, 05/28/24 PCP as evidenced by chart review and patient report        continue to work with RN Care Manager and/or Social Worker to address care management and care coordination needs related to HTN as evidenced by adherence to care management team scheduled appointments     demonstrate Improved adherence to prescribed treatment plan for HTN as evidenced by taking daily blood press and weight readings and recording the results for review with RNCM during net visit.  Take all medication as prescribed.  Continue to work with Clinical Pharmacist to manage high blood pressure. Patient BP readings significantly improved with change in dosage of medications.  Patient reports feeling much stronger, able to wash her car a few days ago, feeling confident and strong enough to resume driving.   Interventions:   Hypertension Interventions: Last practice recorded BP readings:  BP Readings from Last 3 Encounters:  05/22/24 114/70  05/13/24 131/76  05/08/24 (!) 149/79  Patient recently had increased dosage of amlodipine  to 5mg  per day, continuing on metoprolol  100 mg, will have home visit from Clinical Pharmacist to check accuracy of home BP cuff. Most recent eGFR/CrCl: No results found for: EGFR  No components found for: CRCL  Evaluation  of current treatment plan related to hypertension self management and patient's adherence to plan as established by provider Provided education to patient re: stroke prevention, s/s of heart attack and stroke Reviewed medications with patient and discussed importance of compliance Discussed plans with patient for ongoing care management follow up and provided patient with direct contact information for care management team Advised patient, providing education and rationale, to monitor blood pressure daily and record, calling PCP for findings outside established parameters Discussed complications of poorly controlled blood pressure such as heart disease, stroke, circulatory complications, vision complications, kidney impairment, sexual dysfunction  Patient Self-Care Activities:  Attend all scheduled provider appointments Call pharmacy for medication refills 3-7 days in advance of running out of medications Call provider office for new concerns or questions  Perform all self care activities independently  Take medications as prescribed   Work with the pharmacist to address medication management needs and will continue to work with the clinical team to address health care and disease management related needs check blood pressure daily write blood pressure results in a log or diary keep a blood pressure log take blood pressure log to all doctor appointments call doctor for signs and symptoms of high blood pressure keep all doctor appointments take medications for blood pressure exactly as prescribed report new symptoms to your doctor  Plan:  Patient had to abruptly end call before scheduling follow up appointment.  She will call back to schedule next visit.  Please call the Suicide and Crisis Lifeline: 988 call the [place]  National Suicide Prevention Lifeline: 217-820-0978 or TTY: 435-359-3933 TTY 912-620-6052) to talk to a trained counselor call 1-800-273-TALK (toll free, 24  hour hotline) if you are experiencing a Mental Health or Behavioral Health Crisis or need someone to talk to.  Patient verbalizes understanding of instructions and care plan provided today and agrees to view in MyChart. Active MyChart status and patient understanding of how to access instructions and care plan via MyChart confirmed with patient.     SIGNATURE  Olam Idol BSN RN CCM [place]  [facility], [facility] RN Care Manager Direct Dial: [phone] Fax: [phone]

## 2024-06-07 NOTE — Patient Outreach (Signed)
 Complex Care Management   Visit Note  06/07/2024  Name:  Evelyn [name] MRN: [id number] DOB: 1930-05-06  Situation: Referral received for Complex Care Management related to Atrial Fibrillation and HTN I obtained verbal consent from Patient.  Visit completed with patient  on the phone  Background:   Past Medical History:  Diagnosis Date   Atrial fibrillation (HCC)    Hyperlipidemia    Hypertension    Osteoporosis    Post-menopausal    HRT    Assessment: Patient Reported Symptoms:  Cognitive Cognitive Status: Alert and oriented to person, place, and time      Neurological Neurological Review of Symptoms: No symptoms reported    HEENT HEENT Symptoms Reported: No symptoms reported      Cardiovascular Cardiovascular Symptoms Reported: No symptoms reported Does patient have uncontrolled Hypertension?: Yes Is patient checking Blood Pressure at home?: Yes Patient's Recent BP reading at home: patient reports BP WNL since dosage corrrection and regular fu with RNCM and clinical pharmacist Cardiovascular Management Strategies: Medication therapy, Routine screening Do You Have a Working Readable Scale?: Yes  Respiratory Respiratory Symptoms Reported: No symptoms reported    Endocrine Endocrine Symptoms Reported: No symptoms reported Is patient diabetic?: No    Gastrointestinal Gastrointestinal Symptoms Reported: No symptoms reported      Genitourinary Genitourinary Symptoms Reported: Frequency    Integumentary Integumentary Symptoms Reported: No symptoms reported Skin Management Strategies: Routine screening  Musculoskeletal Additional Musculoskeletal Details: patient reports continued significant improvement, has been working outside, washing her car, now feels she can begin driving again. Musculoskeletal Management Strategies: Coping strategies, Adequate rest, Routine screening, Medication therapy Falls in the past year?: No    Psychosocial Psychosocial Symptoms Reported: No  symptoms reported            05/14/2024    3:36 PM  Depression screen PHQ 2/9  Decreased Interest 0  Down, Depressed, Hopeless 0  PHQ - 2 Score 0    There were no vitals filed for this visit.  Medications Reviewed Today     Reviewed by Lonzell Planas, RN (Registered Nurse) on 06/07/24 at 1507  Med List Status: <None>   Medication Order Taking? Sig Documenting Provider Last Dose Status Informant  acetaminophen  (QC ACETAMINOPHEN  8 HOURS) 650 MG CR tablet [id number]  Take 650 mg by mouth every 8 (eight) hours as needed for pain. [provider]  Active   amLODipine  (NORVASC ) 5 MG tablet [id number]  Take 1 tablet (5 mg total) by mouth daily. Frann Mabel Mt, DO  Active   apixaban  (ELIQUIS ) 2.5 MG TABS tablet [id number]  Take 1 tablet (2.5 mg total) by mouth 2 (two) times daily. Darron Deatrice LABOR, MD  Active   ascorbic acid (VITAMIN C) 500 MG tablet [id number]  Take 500 mg by mouth daily. [provider]  Active Self, Pharmacy Records           Med Note [facility], [facility]   Tue May 07, 2024 11:20 AM) Patient has run out  calcium carbonate (OS-CAL) 600 MG TABS [id number]  Take 600 mg by mouth daily. [provider]  Active Self, Pharmacy Records  Cholecalciferol (VITAMIN D  PO) [id number]  Take 1 capsule by mouth daily. 2000 units [provider]  Active Self, Pharmacy Records           Med Note [facility], [facility]   Tue May 07, 2024 11:21 AM) Patient has run out  Co-Enzyme Q10 100 MG CAPS [id number]  Take 100 mg by  mouth daily. [provider]  Active   glucosamine-chondroitin 500-400 MG tablet [id number]  Take 1 tablet by mouth daily.  [provider]  Active Self, Pharmacy Records  Latanoprostene Bunod [place] OP) [id number]  Apply 1 drop to eye once. One drop in each eye per day for glaucoma [provider]  Active Self  Magnesium 400 MG TABS [id number]  Take 400 mg by mouth daily. [provider]  Active   metoprolol   succinate (TOPROL -XL) 100 MG 24 hr tablet [id number]  Take 1 tablet (100 mg total) by mouth daily. Take with or immediately following a meal. Antonio Meth, Yvonne R, DO  Active   Red Yeast Rice 600 MG TABS [id number]  Take 1 tablet by mouth daily. [provider]  Active Self, Pharmacy Records  timolol (TIMOPTIC) 0.5 % ophthalmic solution [id number]  1 drop every morning. [provider]  Active Self, Pharmacy Records            Recommendation:   Continue Current Plan of Care  Follow Up Plan:   Patient will call back to [facility] to schedule next visit.  SIG  Olam Idol BSN RN CCM [place]  [facility], [facility] RN Care Manager Direct Dial: [phone] Fax: [phone]

## 2024-06-12 ENCOUNTER — Other Ambulatory Visit (INDEPENDENT_AMBULATORY_CARE_PROVIDER_SITE_OTHER): Admitting: Pharmacist

## 2024-06-12 DIAGNOSIS — I10 Essential (primary) hypertension: Secondary | ICD-10-CM

## 2024-06-12 NOTE — Progress Notes (Signed)
 Pharmacy Note  06/12/2024 Name: Evelyn [name] MRN: [id number] DOB: 01/21/1930  Subjective: Evelyn [name] is a 89 y.o. year old female who is a primary care patient of Antonio Meth, Jamee SAUNDERS, DO. Clinical Pharmacist Practitioner referral was placed to assist with medication management.    Engaged with patient by telephone to follow up hypertension   She fractured her hip Feb 19th 2025. She was in the hospital for about 1 week and then in rehab for about 4 to 6 weeks. Returned home around 02/09/2024. She is not walking on her own yet, using rollator. She drove for the first time yesterday 06/13/2024.   Hypertension Current blood pressure medications: metoprolol  succinate ER 100mg  once a day and amlodipine  5mg  daily   Amlodipine  was restarted 04/23/2024 at 2.5mg  daily, then dose was increased to 5mg  daily 04/29/2024 when she saw Dr Frann due to elevated blood pressure.   Patient has been checking blood pressure at home. Last week's blood pressure readings - 137/70; 137/83; 146/74; 138/72; 121/68 Heart rate - 84, 76, 85, 71, 80, 72   BP Readings from Last 3 Encounters:  05/22/24 114/70  05/13/24 131/76  05/08/24 (!) 149/79    Objective: Review of patient status, including review of consultants reports, laboratory and other test data, was performed as part of comprehensive.  Lab Results  Component Value Date   CREATININE 0.64 03/05/2024   CREATININE 0.77 01/23/2024   CREATININE 0.91 01/22/2024    No results found for: HGBA1C     Component Value Date/Time   CHOL 177 03/05/2024 1353   TRIG 110.0 03/05/2024 1353   HDL 61.70 03/05/2024 1353   CHOLHDL 3 03/05/2024 1353   VLDL 22.0 03/05/2024 1353   LDLCALC 93 03/05/2024 1353   LDLDIRECT 130.5 03/26/2013 0916     Clinical ASCVD: No  The ASCVD Risk score (Arnett DK, et al., 2019) failed to calculate for the following reasons:   The 2019 ASCVD risk score is only valid for ages 13 to  22    BP Readings from Last 3 Encounters:  05/22/24 114/70  05/13/24 131/76  05/08/24 (!) 149/79     Allergies  Allergen Reactions   Vicodin [Hydrocodone-Acetaminophen ] Other (See Comments)    Abdominal Pain   Pletaal [Cilostazol ] Diarrhea   Statins     Medications Reviewed Today     Reviewed by Carla Milling, RPH-CPP (Pharmacist) on 06/12/24 at 1638  Med List Status: <None>   Medication Order Taking? Sig Documenting Provider Last Dose Status Informant  acetaminophen  (QC ACETAMINOPHEN  8 HOURS) 650 MG CR tablet [id number] Yes Take 650 mg by mouth every 8 (eight) hours as needed for pain. [provider]  Active   amLODipine  (NORVASC ) 5 MG tablet [id number] Yes Take 1 tablet (5 mg total) by mouth daily. Frann Mabel Mt, DO  Active   apixaban  (ELIQUIS ) 2.5 MG TABS tablet [id number] Yes Take 1 tablet (2.5 mg total) by mouth 2 (two) times daily. Darron Deatrice LABOR, MD  Active   ascorbic acid (VITAMIN C) 500 MG tablet [id number] Yes Take 500 mg by mouth daily. [provider]  Active Self, Pharmacy Records           Med Note [facility], [facility]   Wed Jun 12, 2024  4:38 PM)    calcium carbonate (OS-CAL) 600 MG TABS [id number] Yes Take 600 mg by mouth daily. [provider]  Active Self, Pharmacy Records  Cholecalciferol (VITAMIN D  PO) [id number] Yes Take 1 capsule  by mouth daily. 2000 units [provider]  Active Self, Pharmacy Records           Med Note [facility], [facility]   Wed Jun 12, 2024  4:38 PM)    Co-Enzyme Q10 100 MG CAPS [id number] Yes Take 100 mg by mouth daily. [provider]  Active   glucosamine-chondroitin 500-400 MG tablet [id number] Yes Take 1 tablet by mouth daily.  [provider]  Active Self, Pharmacy Records  Latanoprostene Bunod [place] OP) [id number] Yes Apply 1 drop to eye once. One drop in each eye per day for glaucoma [provider]  Active Self  Magnesium 400 MG TABS [id number]  Take 400 mg by mouth daily.  [provider]  Active   metoprolol  succinate (TOPROL -XL) 100 MG 24 hr tablet [id number] Yes Take 1 tablet (100 mg total) by mouth daily. Take with or immediately following a meal. Antonio Meth, Yvonne R, DO  Active   Red Yeast Rice 600 MG TABS [id number]  Take 1 tablet by mouth daily.  Patient not taking: Reported on 06/12/2024   [provider]  Active Self, Pharmacy Records  timolol (TIMOPTIC) 0.5 % ophthalmic solution [id number] Yes 1 drop every morning. [provider]  Active Self, Pharmacy Records            Patient Active Problem List   Diagnosis Date Noted   Closed displaced intertrochanteric fracture of left femur (HCC) 01/17/2024   Fall 01/17/2024   Leukocytosis 01/17/2024   Change in bowel function 02/01/2022   Hyperlipidemia 04/14/2020   Seasonal allergies 11/27/2019   Persistent atrial fibrillation (HCC) 03/26/2019   Anticoagulated 03/26/2019   Loose stools 04/14/2015   Osteoarthritis 04/14/2015   Chronic flank pain 06/17/2013   Plantar fasciitis 05/25/2013   PAD (peripheral artery disease) (HCC) 03/07/2013   Acute hip pain, left 02/05/2013   CARDIAC MURMUR 06/29/2010   VARICOSE VEINS, LOWER EXTREMITIES 06/02/2010   PLANTAR WART, LEFT 04/03/2009   Benign neoplasm of skin 04/03/2009   HIP PAIN, RIGHT 04/03/2009   Dyslipidemia, goal LDL below 70 06/29/2007   ARTIFICIAL MENOPAUSE 06/29/2007   Pain in limb 06/29/2007   Personal history presenting hazards to health 06/29/2007   Unspecified glaucoma 04/27/2007   Essential hypertension 04/27/2007   Osteoporosis 04/27/2007     Medication Assistance:  None required.  Patient affirms current coverage meets needs.   Assessment / Plan: Hypertension: blood pressure has improved since amlodipine  restarted.  - Continue to take metoprolol  SUCCINATE ER 100mg  ONCE a day and amlodipine  5mg  daily  - Continue to check blood pressure - recommended that she check 3 times with 5 minutes between and  average readings.   Follow up 4 to 6 weeks.  Madelin Ray, PharmD Clinical Pharmacist [facility]  - [place] [facility] [phone]

## 2024-06-17 DIAGNOSIS — H401122 Primary open-angle glaucoma, left eye, moderate stage: Secondary | ICD-10-CM | POA: Diagnosis not present

## 2024-06-17 DIAGNOSIS — H401111 Primary open-angle glaucoma, right eye, mild stage: Secondary | ICD-10-CM | POA: Diagnosis not present

## 2024-06-17 DIAGNOSIS — Z961 Presence of intraocular lens: Secondary | ICD-10-CM | POA: Diagnosis not present

## 2024-07-11 ENCOUNTER — Other Ambulatory Visit: Payer: Self-pay

## 2024-07-11 NOTE — Patient Outreach (Signed)
 Complex Care Management   Visit Note  07/11/2024  Name:  Evelyn [name] MRN: [id number] DOB: 05/18/1930  Situation: Referral received for Complex Care Management related to HTN I obtained verbal consent from Patient.  Visit completed with patient  on the phone  Background:   Past Medical History:  Diagnosis Date   Atrial fibrillation (HCC)    Hyperlipidemia    Hypertension    Osteoporosis    Post-menopausal    HRT    Assessment: Patient Reported Symptoms:  Cognitive Cognitive Status: No symptoms reported, Normal speech and language skills, Insightful and able to interpret abstract concepts      Neurological Neurological Review of Symptoms: No symptoms reported    HEENT HEENT Symptoms Reported: No symptoms reported      Cardiovascular Does patient have uncontrolled Hypertension?: No (patient reports BP improved since restarting amlodipine ) Is patient checking Blood Pressure at home?: Yes Patient's Recent BP reading at home: 137/72 HR 61 working with clinical pharmacist also    Respiratory Respiratory Symptoms Reported: No symptoms reported    Endocrine Endocrine Symptoms Reported: No symptoms reported Is patient diabetic?: No    Gastrointestinal Gastrointestinal Symptoms Reported: No symptoms reported      Genitourinary Genitourinary Symptoms Reported: Incontinence Additional Genitourinary Details: patient states when she ignores going to the BR or moves a certain way with a full bladder. patient reports every now and then bladder leaks, reports wearing a light pad in case. states will practice her Kegel muscles more Genitourinary Management Strategies: Incontinence garment/pad  Integumentary Integumentary Symptoms Reported: Bruising Other Integumentary Symptoms: reports bruises easily due to being on blood thinner Skin Management Strategies: Routine screening  Musculoskeletal Musculoskelatal Symptoms Reviewed: Difficulty walking, Unsteady gait Additional Musculoskeletal  Details: History of left hip fracture due to fall from a 2 step ladder. Paitent reports stiffness with a little pain sometimes when she first gets up, but after on it a little bit it goes away. uses rollator walker or cane. reports started driving last week. patient states she was instructed by orthopedic provider, when she could walk then she could drive again. RNCM advised patient to continue to use assistive device. Encouraged to continue exercises provided by previous OT/PT therapy to maintain muscle strength, balance and tone. Denies pain at this time. Musculoskeletal Management Strategies: Medication therapy, Medical device, Routine screening Musculoskeletal Self-Management Outcome: 4 (good) Falls in the past year?: Yes (reports fell off a two step ladder and broke left hip) Number of falls in past year: 1 or less Was there an injury with Fall?: Yes Fall Risk Category Calculator: 2 Patient Fall Risk Level: Moderate Fall Risk Patient at Risk for Falls Due to: History of fall(s), Impaired mobility, Impaired balance/gait Fall risk Follow up: Falls prevention discussed, Education provided  Psychosocial Psychosocial Symptoms Reported: No symptoms reported            05/14/2024    3:36 PM  Depression screen PHQ 2/9  Decreased Interest 0  Down, Depressed, Hopeless 0  PHQ - 2 Score 0    Vitals:   07/11/24 1138 07/11/24 1201  BP: 137/72 124/73  Pulse: 61 62    Medications Reviewed Today     Reviewed by Siah Kannan M, RN (Registered Nurse) on 07/11/24 at 1154  Med List Status: <None>   Medication Order Taking? Sig Documenting Provider Last Dose Status Informant  acetaminophen  (QC ACETAMINOPHEN  8 HOURS) 650 MG CR tablet [id number] Yes Take 650 mg by mouth every 8 (eight) hours as needed for  pain. [provider]  Active   amLODipine  (NORVASC ) 5 MG tablet [id number] Yes Take 1 tablet (5 mg total) by mouth daily. Frann Mabel Mt, DO  Active   apixaban  (ELIQUIS ) 2.5 MG  TABS tablet [id number] Yes Take 1 tablet (2.5 mg total) by mouth 2 (two) times daily. Darron Deatrice LABOR, MD  Active   ascorbic acid (VITAMIN C) 500 MG tablet [id number] Yes Take 500 mg by mouth daily. [provider]  Active Self, Pharmacy Records           Med Note [facility], [facility] B   Wed Jun 12, 2024  4:38 PM)    calcium carbonate (OS-CAL) 600 MG TABS [id number] Yes Take 600 mg by mouth daily. [provider]  Active Self, Pharmacy Records  Cholecalciferol (VITAMIN D  PO) [id number] Yes Take 1 capsule by mouth daily. 2000 units [provider]  Active Self, Pharmacy Records           Med Note [facility], T[facility]MY B   Wed Jun 12, 2024  4:38 PM)    Co-Enzyme Q10 100 MG CAPS [id number] Yes Take 100 mg by mouth daily. [provider]  Active   glucosamine-chondroitin 500-400 MG tablet [id number] Yes Take 1 tablet by mouth daily.  [provider]  Active Self, Pharmacy Records  Latanoprostene Bunod [place] OP) [id number] Yes Apply 1 drop to eye once. One drop in each eye per day for glaucoma [provider]  Active Self  Magnesium 400 MG TABS [id number] Yes Take 400 mg by mouth daily. [provider]  Active   metoprolol  succinate (TOPROL -XL) 100 MG 24 hr tablet [id number] Yes Take 1 tablet (100 mg total) by mouth daily. Take with or immediately following a meal. Antonio Meth, Yvonne R, DO  Active   Red Yeast Rice 600 MG TABS [id number]  Take 1 tablet by mouth daily.  Patient not taking: Reported on 07/11/2024   [provider]  Active Self, Pharmacy Records  timolol (TIMOPTIC) 0.5 % ophthalmic solution [id number] Yes 1 drop every morning. [provider]  Active Self, Pharmacy Records          Recommendation:   Continue Current Plan of Care  Follow Up Plan:   Telephone follow up appointment date/time:  08/08/24 at 2:00 pm  Heddy Shutter, RN, MSN, BSN, CCM [place]  [facility], Population Health Case  Manager Phone: [phone]

## 2024-07-11 NOTE — Patient Instructions (Signed)
 Visit Information  Thank you for taking time to visit with me today. Please don't hesitate to contact me if I can be of assistance to you before our next scheduled appointment.  Your next care management appointment is by telephone on 08/08/24 at 2:00 pm   Please call the care guide team at [phone] if you need to cancel, schedule, or reschedule an appointment.   Please call the Suicide and Crisis Lifeline: 988 call the [place]  National Suicide Prevention Lifeline: 763-015-4520 or TTY: (716)799-2318 TTY 249 182 7022) to talk to a trained counselor call 1-800-273-TALK (toll free, 24 hour hotline) if you are experiencing a Mental Health or Behavioral Health Crisis or need someone to talk to.  Heddy Shutter, RN, MSN, BSN, CCM [place]  [facility], Population Health Case Manager Phone: [phone]

## 2024-08-05 ENCOUNTER — Ambulatory Visit (INDEPENDENT_AMBULATORY_CARE_PROVIDER_SITE_OTHER): Payer: Medicare Other | Admitting: Pharmacist

## 2024-08-05 DIAGNOSIS — I10 Essential (primary) hypertension: Secondary | ICD-10-CM

## 2024-08-05 DIAGNOSIS — I4821 Permanent atrial fibrillation: Secondary | ICD-10-CM

## 2024-08-05 MED ORDER — AMLODIPINE BESYLATE 5 MG PO TABS: 5.0000 mg | ORAL_TABLET | Freq: Every day | ORAL | 0 refills | Status: AC

## 2024-08-05 NOTE — Progress Notes (Signed)
 Pharmacy Note  08/05/2024 Name: Evelyn [name] MRN: [id number] DOB: 07-17-1930  Subjective: Evelyn [name] is a 89 y.o. year old female who is a primary care patient of Antonio Meth, Jamee SAUNDERS, DO. Clinical Pharmacist Practitioner referral was placed to assist with medication management.    Engaged with patient by telephone to follow up hypertension   She fractured her hip Feb 19th 2025. She was in the hospital for about 1 week and then in rehab for about 4 to 6 weeks. Returned home around 02/09/2024.  She reports that she walking on her own again. She has started driving again.   Hypertension Current blood pressure medications:  metoprolol  succinate ER 100mg  once a day - Last refill was for 90 day supply on 07/17/2024 amlodipine  5mg  daily - last refill was for 30 day supply on 05/06/2024 for 30 days - per patient she has some on hand from refill in Feb 2025 when amlodipine  was held after her hospitalization.  Amlodipine  was restarted 04/23/2024 at 2.5mg  daily, then dose was increased to 5mg  daily 04/29/2024 when she saw Dr Frann due to elevated blood pressure.   Patient has decreased how often she is checking blood pressure because it had been good. Checked about 2 or 3 times per week. Systolic blood pressure is usually 120's to 130's and diastolic blood pressure is in the 70's but she checked while I was on the phone with her today and blood pressure was 153/73. Had her sit for awhile and she rechecked and blood pressure was 154/81 (could be elevated because patient is talking).    BP Readings from Last 3 Encounters:  07/11/24 124/73  05/22/24 114/70  05/13/24 131/76    Objective: Review of patient status, including review of consultants reports, laboratory and other test data, was performed as part of comprehensive.  Lab Results  Component Value Date   CREATININE 0.64 03/05/2024   CREATININE 0.77 01/23/2024   CREATININE 0.91 01/22/2024     No results found for: HGBA1C     Component Value Date/Time   CHOL 177 03/05/2024 1353   TRIG 110.0 03/05/2024 1353   HDL 61.70 03/05/2024 1353   CHOLHDL 3 03/05/2024 1353   VLDL 22.0 03/05/2024 1353   LDLCALC 93 03/05/2024 1353   LDLDIRECT 130.5 03/26/2013 0916     Clinical ASCVD: No  The ASCVD Risk score (Arnett DK, et al., 2019) failed to calculate for the following reasons:   The 2019 ASCVD risk score is only valid for ages 3 to 60    BP Readings from Last 3 Encounters:  07/11/24 124/73  05/22/24 114/70  05/13/24 131/76     Allergies  Allergen Reactions   Vicodin [Hydrocodone-Acetaminophen ] Other (See Comments)    Abdominal Pain   Pletaal [Cilostazol ] Diarrhea   Statins     Medications Reviewed Today     Reviewed by Carla Milling, RPH-CPP (Pharmacist) on 08/05/24 at 1447  Med List Status: <None>   Medication Order Taking? Sig Documenting Provider Last Dose Status Informant  acetaminophen  (QC ACETAMINOPHEN  8 HOURS) 650 MG CR tablet [id number] Yes Take 650 mg by mouth every 8 (eight) hours as needed for pain. [provider]  Active   amLODipine  (NORVASC ) 5 MG tablet [id number] Yes Take 1 tablet (5 mg total) by mouth daily. Antonio Meth Jamee R, DO  Active   apixaban  (ELIQUIS ) 2.5 MG TABS tablet [id number] Yes Take 1 tablet (2.5 mg total) by mouth 2 (two) times daily. Darron Deatrice LABOR,  MD  Active   ascorbic acid (VITAMIN C) 500 MG tablet [id number] Yes Take 500 mg by mouth daily. [provider]  Active Self, Pharmacy Records           Med Note [facility], [facility] B   Wed Jun 12, 2024  4:38 PM)    calcium carbonate (OS-CAL) 600 MG TABS [id number] Yes Take 600 mg by mouth daily. [provider]  Active Self, Pharmacy Records  Cholecalciferol (VITAMIN D  PO) [id number] Yes Take 1 capsule by mouth daily. 2000 units [provider]  Active Self, Pharmacy Records           Med Note [facility], [facility]   Wed Jun 12, 2024  4:38 PM)    Co-Enzyme  Q10 100 MG CAPS [id number] Yes Take 100 mg by mouth daily. [provider]  Active     Discontinued 06/04/24 0720 glucosamine-chondroitin 500-400 MG tablet [id number] Yes Take 1 tablet by mouth daily.  [provider]  Active Self, Pharmacy Records  Latanoprostene Bunod [place] OP) [id number] Yes Apply 1 drop to eye once. One drop in each eye per day for glaucoma [provider]  Active Self  Magnesium 400 MG TABS [id number] Yes Take 400 mg by mouth daily. [provider]  Active   metoprolol  succinate (TOPROL -XL) 100 MG 24 hr tablet [id number] Yes Take 1 tablet (100 mg total) by mouth daily. Take with or immediately following a meal. Lowne Chase, Yvonne R, DO  Active    Patient not taking:   Discontinued 08/05/24 1437 (Change in therapy)            Med Note DANICE, LISA   Fri Mar 29, 2024  1:43 PM) Patient was confused still about dosage instructions, RNCM reviewed again, instructed her to take 2 50mg  tablets twice daily until she runs out of those, she will call pharmacy and have them fill the 100 mg tablets, and instructed to take one tablet twice a day.      Discontinued 08/05/24 1437 (Change in therapy)            Med Note DANICE, LISA   Fri Mar 29, 2024  1:44 PM) Taking two tablets for a total of 100 mg twice daily  Red Yeast Rice 600 MG TABS [id number]  Take 1 tablet by mouth daily.  Patient not taking: Reported on 08/05/2024   [provider]  Active Self, Pharmacy Records  timolol (TIMOPTIC) 0.5 % ophthalmic solution [id number] Yes 1 drop every morning. [provider]  Active Self, Pharmacy Records            Patient Active Problem List   Diagnosis Date Noted   Closed displaced intertrochanteric fracture of left femur (HCC) 01/17/2024   Fall 01/17/2024   Leukocytosis 01/17/2024   Change in bowel function 02/01/2022   Hyperlipidemia 04/14/2020   Seasonal allergies 11/27/2019   Persistent atrial fibrillation (HCC) 03/26/2019    Anticoagulated 03/26/2019   Loose stools 04/14/2015   Osteoarthritis 04/14/2015   Chronic flank pain 06/17/2013   Plantar fasciitis 05/25/2013   PAD (peripheral artery disease) (HCC) 03/07/2013   Acute hip pain, left 02/05/2013   CARDIAC MURMUR 06/29/2010   VARICOSE VEINS, LOWER EXTREMITIES 06/02/2010   PLANTAR WART, LEFT 04/03/2009   Benign neoplasm of skin 04/03/2009   HIP PAIN, RIGHT 04/03/2009   Dyslipidemia, goal LDL below 70 06/29/2007   ARTIFICIAL MENOPAUSE 06/29/2007   Pain in limb 06/29/2007   Personal history presenting hazards to health 06/29/2007  Unspecified glaucoma 04/27/2007   Essential hypertension 04/27/2007   Osteoporosis 04/27/2007     Medication Assistance:  None required.  Patient affirms current coverage meets needs.   Assessment / Plan: Hypertension: blood pressure at home usually at goal but today was elevated - Continue to take metoprolol  SUCCINATE ER 100mg  ONCE a day and amlodipine  5mg  daily  - Continue to check blood pressure daily for the next 3 days. She has a follow up with [facility] nurse 08/08/2024 to review blood pressure log.  - Reminded patient to rest for at least 5 minutes prior to checking blood pressure and not to talk while checking blood pressure.   Meds ordered this encounter  Medications   amLODipine  (NORVASC ) 5 MG tablet    Sig: Take 1 tablet (5 mg total) by mouth daily.    Dispense:  90 tablet    Refill:  0    Please cancel any previous prescriptions. Fill / Hold based on patient's refill schedule.     Made follow up with PCP today - 09/03/2024 - per CPE in April recommended follow up in 6 months.  I will follow up in 2 months unless needed sooner.   Madelin Ray, PharmD Clinical Pharmacist [facility]  - [facility] [phone]

## 2024-08-08 ENCOUNTER — Other Ambulatory Visit: Payer: Self-pay

## 2024-08-08 NOTE — Patient Outreach (Signed)
 Complex Care Management   Visit Note  08/08/2024  Name:  Evelyn [name] MRN: [id number] DOB: 12/25/1929  Situation: Referral received for Complex Care Management related to htn I obtained verbal consent from Patient.  Visit completed with Patient  on the phone  Background:   Past Medical History:  Diagnosis Date   Atrial fibrillation (HCC)    Hyperlipidemia    Hypertension    Osteoporosis    Post-menopausal    HRT    Assessment: Patient Reported Symptoms:  Cognitive Cognitive Status: No symptoms reported, Alert and oriented to person, place, and time      Neurological Neurological Review of Symptoms: No symptoms reported    HEENT HEENT Symptoms Reported: No symptoms reported      Cardiovascular Cardiovascular Symptoms Reported: No symptoms reported Does patient have uncontrolled Hypertension?: No Is patient checking Blood Pressure at home?: Yes Patient's Recent BP reading at home: 142/73 HR 66 today, 08/07/24 147/68 HR 66    Respiratory Respiratory Symptoms Reported: No symptoms reported    Endocrine Endocrine Symptoms Reported: No symptoms reported Is patient diabetic?: No    Gastrointestinal Gastrointestinal Symptoms Reported: No symptoms reported Additional Gastrointestinal Details: patient reports bowel movements had been soft, but back to normal now.      Genitourinary Genitourinary Symptoms Reported: No symptoms reported    Integumentary Other Integumentary Symptoms: splotches on my legs and veins breaking this year. patient reports cardiologist has assessed this.    Musculoskeletal Additional Musculoskeletal Details: patient reports favors left leg , continues to use rollator walker in the house and cane when she goes out. patient reports she continues to perform the excercises recommended by therapist.        Psychosocial Psychosocial Symptoms Reported: No symptoms reported         Vitals:   08/08/24 1432 08/08/24 1433  BP: (!) 147/68 (!) 142/73   Pulse: 66 66    Medications Reviewed Today     Reviewed by Ayush Boulet M, RN (Registered Nurse) on 08/08/24 at 1427  Med List Status: <None>   Medication Order Taking? Sig Documenting Provider Last Dose Status Informant  acetaminophen  (QC ACETAMINOPHEN  8 HOURS) 650 MG CR tablet [id number] Yes Take 650 mg by mouth every 8 (eight) hours as needed for pain. [provider]  Active   amLODipine  (NORVASC ) 5 MG tablet [id number] Yes Take 1 tablet (5 mg total) by mouth daily. Lowne Chase, Yvonne R, DO  Active   apixaban  (ELIQUIS ) 2.5 MG TABS tablet [id number] Yes Take 1 tablet (2.5 mg total) by mouth 2 (two) times daily.   Active   ascorbic acid (VITAMIN C) 500 MG tablet [id number] Yes Take 500 mg by mouth daily. [provider]  Active Self, Pharmacy Records           Med Note [facility], [facility] B   Wed Jun 12, 2024  4:38 PM)    calcium carbonate (OS-CAL) 600 MG TABS [id number] Yes Take 600 mg by mouth daily. [provider]  Active Self, Pharmacy Records  Cholecalciferol (VITAMIN D  PO) [id number] Yes Take 1 capsule by mouth daily. 2000 units [provider]  Active Self, Pharmacy Records           Med Note [facility], [facility]MY B   Wed Jun 12, 2024  4:38 PM)    Co-Enzyme Q10 100 MG CAPS [id number] Yes Take 100 mg by mouth daily. [provider]  Active   glucosamine-chondroitin 500-400 MG tablet [id number] Yes Take 1 tablet by  mouth daily.  [provider]  Active Self, Pharmacy Records  Latanoprostene Bunod [place] OP) [id number] Yes Apply 1 drop to eye once. One drop in each eye per day for glaucoma [provider]  Active Self  Magnesium 400 MG TABS [id number]  Take 400 mg by mouth daily.  Patient not taking: Reported on 08/08/2024   [provider]  Active   metoprolol  succinate (TOPROL -XL) 100 MG 24 hr tablet [id number] Yes Take 1 tablet (100 mg total) by mouth daily. Take with or immediately following a meal. Antonio Meth, Yvonne R, DO   Active   Red Yeast Rice 600 MG TABS [id number]  Take 1 tablet by mouth daily.  Patient not taking: Reported on 08/08/2024   [provider]  Active Self, Pharmacy Records  timolol (TIMOPTIC) 0.5 % ophthalmic solution [id number] Yes 1 drop every morning. [provider]  Active Self, Pharmacy Records          Recommendation:   PCP Follow-up Continue Current Plan of Care  Follow Up Plan:   Telephone follow up appointment date/time:  09/05/24 at 2:00 pm   Heddy Shutter, RN, MSN, BSN, CCM [place]  [facility], Population Health Case Manager Phone: [phone]

## 2024-08-08 NOTE — Patient Instructions (Signed)
 Visit Information  Thank you for taking time to visit with me today. Please don't hesitate to contact me if I can be of assistance to you before our next scheduled appointment.  Your next care management appointment is by telephone on 09/05/24 at 2:00 pm  Please call the care guide team at [phone] if you need to cancel, schedule, or reschedule an appointment.   Please call the Suicide and Crisis Lifeline: 988 call the [place]  National Suicide Prevention Lifeline: 279 026 9236 or TTY: [phone] TTY 548 360 1021) to talk to a trained counselor call 1-800-273-TALK (toll free, 24 hour hotline) if you are experiencing a Mental Health or Behavioral Health Crisis or need someone to talk to  Heddy Shutter, RN, MSN, BSN, CCM [place]  [facility], Population Health Case Manager Phone: [phone]

## 2024-09-03 ENCOUNTER — Ambulatory Visit: Admitting: Family Medicine

## 2024-09-05 ENCOUNTER — Other Ambulatory Visit

## 2024-09-05 NOTE — Patient Instructions (Signed)
 Visit Information  Thank you for taking time to visit with me today. Please don't hesitate to contact me if I can be of assistance to you before our next scheduled appointment.  Your next care management appointment is by telephone on 11/ 10/25 at 2:30 pm  Please call the care guide team at [phone] if you need to cancel, schedule, or reschedule an appointment.   Please call the Suicide and Crisis Lifeline: 988 call the [place]  National Suicide Prevention Lifeline: (616) 716-6284 or TTY: (251) 262-3397 TTY 810-493-4839) to talk to a trained counselor if you are experiencing a Mental Health or Behavioral Health Crisis or need someone to talk to.   Heddy Shutter, RN, MSN, BSN, CCM   [facility], Population Health Case Manager Phone: [phone]

## 2024-09-05 NOTE — Patient Outreach (Signed)
 Complex Care Management   Visit Note  09/05/2024  Name:  Evelyn [name] MRN: [id number] DOB: [birth year]  Situation: Referral received for Complex Care Management related to HTN I obtained verbal consent from Patient.  Visit completed with Patient  in the office  Background:   Past Medical History:  Diagnosis Date   Atrial fibrillation (HCC)    Hyperlipidemia    Hypertension    Osteoporosis    Post-menopausal    HRT    Assessment: Patient Reported Symptoms:  Cognitive Cognitive Status: No symptoms reported      Neurological Neurological Review of Symptoms: No symptoms reported    HEENT HEENT Symptoms Reported: No symptoms reported      Cardiovascular Cardiovascular Symptoms Reported: No symptoms reported Does patient have uncontrolled Hypertension?: No Cardiovascular Management Strategies: Medication therapy, Exercise, Diet modification, Adequate rest, Activity Weight: 109 lb (49.4 kg) Cardiovascular Self-Management Outcome: 4 (good) Cardiovascular Comment: Patient reports 09/04/24 BP 137/71 HR 77; 09/03/24 BP 126/65 hr 63;  08/31/24 morning BP 120/63 HR 74; 08/31/24 11:40 pm BP 127/70 hr 71  Respiratory Respiratory Symptoms Reported: No symptoms reported    Endocrine Endocrine Symptoms Reported: No symptoms reported Is patient diabetic?: No    Gastrointestinal Gastrointestinal Symptoms Reported: No symptoms reported      Genitourinary Genitourinary Symptoms Reported: No symptoms reported    Integumentary Integumentary Symptoms Reported: No symptoms reported    Musculoskeletal   Musculoskeletal Management Strategies: Exercise (patient reports she is doing excercise every day(exercised provided by therapist in past). reports she is lifting 5 lb weights.)      Psychosocial Psychosocial Symptoms Reported: No symptoms reported          Vitals:   09/05/24 1443  BP: 137/71  Pulse: 77    Medications Reviewed Today     Reviewed by Naliah Eddington M, RN (Registered  Nurse) on 09/05/24 at 1408  Med List Status: <None>   Medication Order Taking? Sig Documenting Provider Last Dose Status Informant  acetaminophen  (QC ACETAMINOPHEN  8 HOURS) 650 MG CR tablet [id number] Yes Take 650 mg by mouth every 8 (eight) hours as needed for pain. [provider]  Active   amLODipine  (NORVASC ) 5 MG tablet [id number] Yes Take 1 tablet (5 mg total) by mouth daily. Lowne Chase, Yvonne R, DO  Active   apixaban  (ELIQUIS ) 2.5 MG TABS tablet [id number] Yes Take 1 tablet (2.5 mg total) by mouth 2 (two) times daily.   Active   ascorbic acid (VITAMIN C) 500 MG tablet [id number] Yes Take 500 mg by mouth daily. [provider]  Active Self, Pharmacy Records           Med Note [facility], [facility] B   Wed Jun 12, 2024  4:38 PM)    calcium carbonate (OS-CAL) 600 MG TABS [id number] Yes Take 600 mg by mouth daily. [provider]  Active Self, Pharmacy Records  Cholecalciferol (VITAMIN D  PO) [id number] Yes Take 1 capsule by mouth daily. 2000 units [provider]  Active Self, Pharmacy Records           Med Note [facility], TAMMY B   Wed Jun 12, 2024  4:38 PM)    Co-Enzyme Q10 100 MG CAPS [id number] Yes Take 100 mg by mouth daily. [provider]  Active   glucosamine-chondroitin 500-400 MG tablet [id number] Yes Take 1 tablet by mouth daily.  [provider]  Active Self, Pharmacy Records  Latanoprostene Bunod [place] OP) [id number] Yes Apply 1 drop to eye once. One  drop in each eye per day for glaucoma [provider]  Active Self  Magnesium 400 MG TABS [id number]  Take 400 mg by mouth daily.  Patient not taking: Reported on 09/05/2024   [provider]  Active   metoprolol  succinate (TOPROL -XL) 100 MG 24 hr tablet [id number] Yes Take 1 tablet (100 mg total) by mouth daily. Take with or immediately following a meal. Antonio Meth, Yvonne R, DO  Active   Red Yeast Rice 600 MG TABS [id number]  Take 1 tablet by mouth daily.  Patient not taking:  Reported on 09/05/2024   [provider]  Active Self, Pharmacy Records  timolol (TIMOPTIC) 0.5 % ophthalmic solution [id number] Yes 1 drop every morning. [provider]  Active Self, Pharmacy Records          Recommendation:   Continue Current Plan of Care  Follow Up Plan:   Telephone follow up appointment date/time:  10/07/24 at 2:30 pm  Heddy Shutter, RN, MSN, BSN, CCM [place]  [facility], Population Health Case Manager Phone: [phone]

## 2024-09-25 ENCOUNTER — Other Ambulatory Visit: Admitting: Pharmacist

## 2024-09-25 NOTE — Progress Notes (Signed)
 Pharmacy Note  09/25/2024 Name: Evelyn [name] MRN: [id number] DOB: 1930/06/08  Subjective: Evelyn [name] Evelyn [name] is a 89 y.o. year old female who is a primary care patient of Antonio Meth, Jamee SAUNDERS, DO. Clinical Pharmacist Practitioner referral was placed to assist with medication management.    Engaged with patient by telephone to follow up hypertension   She fractured her hip Feb 19th 2025. She was in the hospital for about 1 week and then in rehab for about 4 to 6 weeks. Returned home around 02/09/2024.  She reports that she walking on her own but sometimes using Rolator. She is driving again.  She states that she sometimes has pain in her hip which is usually relieved with pain cream or acetaminophen . She is exercising with exercises provided by PT.   Hypertension Current blood pressure medications:  metoprolol  succinate ER 100mg  once a day - Last refill was for 90 day supply on 07/17/2024 amlodipine  5mg  daily - last refill was for 90 day supply on 08/05/2024.   Patient has decreased how often she is checking blood pressure because it had been good. Checking about 2 or 3 times per week. 142/73 139/79 140/80 135/74 Heart rate - 62 to 78   Blood pressure was 137/63 and P- 74 today    BP Readings from Last 3 Encounters:  09/25/24 137/63  09/05/24 137/71  08/08/24 (!) 142/73    Objective: Review of patient status, including review of consultants reports, laboratory and other test data, was performed as part of comprehensive.  Lab Results  Component Value Date   CREATININE 0.64 03/05/2024   CREATININE 0.77 01/23/2024   CREATININE 0.91 01/22/2024    No results found for: HGBA1C     Component Value Date/Time   CHOL 177 03/05/2024 1353   TRIG 110.0 03/05/2024 1353   HDL 61.70 03/05/2024 1353   CHOLHDL 3 03/05/2024 1353   VLDL 22.0 03/05/2024 1353   LDLCALC 93 03/05/2024 1353   LDLDIRECT 130.5 03/26/2013 0916     Clinical ASCVD:  No  The ASCVD Risk score (Arnett DK, et al., 2019) failed to calculate for the following reasons:   The 2019 ASCVD risk score is only valid for ages 27 to 13    BP Readings from Last 3 Encounters:  09/25/24 137/63  09/05/24 137/71  08/08/24 (!) 142/73     Allergies  Allergen Reactions   Vicodin [Hydrocodone-Acetaminophen ] Other (See Comments)    Abdominal Pain   Pletaal [Cilostazol ] Diarrhea   Statins     Medications Reviewed Today     Reviewed by Carla Milling, RPH-CPP (Pharmacist) on 09/25/24 at 1532  Med List Status: <None>   Medication Order Taking? Sig Documenting Provider Last Dose Status Informant  acetaminophen  (QC ACETAMINOPHEN  8 HOURS) 650 MG CR tablet [id number] Yes Take 650 mg by mouth every 8 (eight) hours as needed for pain. [provider]  Active   amLODipine  (NORVASC ) 5 MG tablet [id number] Yes Take 1 tablet (5 mg total) by mouth daily. Lowne Chase, Yvonne R, DO  Active   apixaban  (ELIQUIS ) 2.5 MG TABS tablet [id number] Yes Take 1 tablet (2.5 mg total) by mouth 2 (two) times daily.   Active   ascorbic acid (VITAMIN C) 500 MG tablet [id number] Yes Take 500 mg by mouth daily. [provider]  Active Self, Pharmacy Records           Med Note [facility], [facility] B   Wed Jun 12, 2024  4:38 PM)  calcium carbonate (OS-CAL) 600 MG TABS [id number] Yes Take 600 mg by mouth daily. [provider]  Active Self, Pharmacy Records  Cholecalciferol (VITAMIN D  PO) [id number] Yes Take 1 capsule by mouth daily. 2000 units [provider]  Active Self, Pharmacy Records           Med Note [facility], Areyanna Figeroa B   Wed Jun 12, 2024  4:38 PM)    Co-Enzyme Q10 100 MG CAPS [id number] Yes Take 100 mg by mouth daily. [provider]  Active   glucosamine-chondroitin 500-400 MG tablet [id number] Yes Take 1 tablet by mouth daily.  [provider]  Active Self, Pharmacy Records  Latanoprostene Bunod [place] OP) [id number] Yes Apply 1 drop to eye once. One drop  in each eye per day for glaucoma [provider]  Active Self  Magnesium 400 MG TABS [id number]  Take 400 mg by mouth daily.  Patient not taking: Reported on 09/25/2024   [provider]  Active   metoprolol  succinate (TOPROL -XL) 100 MG 24 hr tablet [id number] Yes Take 1 tablet (100 mg total) by mouth daily. Take with or immediately following a meal. Antonio Meth, Yvonne R, DO  Active   Red Yeast Rice 600 MG TABS [id number] Yes Take 1 tablet by mouth daily. [provider]  Active Self, Pharmacy Records  timolol (TIMOPTIC) 0.5 % ophthalmic solution [id number] Yes 1 drop every morning. [provider]  Active Self, Pharmacy Records            Patient Active Problem List   Diagnosis Date Noted   Closed displaced intertrochanteric fracture of left femur (HCC) 01/17/2024   Fall 01/17/2024   Leukocytosis 01/17/2024   Change in bowel function 02/01/2022   Hyperlipidemia 04/14/2020   Seasonal allergies 11/27/2019   Persistent atrial fibrillation (HCC) 03/26/2019   Anticoagulated 03/26/2019   Loose stools 04/14/2015   Osteoarthritis 04/14/2015   Chronic flank pain 06/17/2013   Plantar fasciitis 05/25/2013   PAD (peripheral artery disease) 03/07/2013   Acute hip pain, left 02/05/2013   CARDIAC MURMUR 06/29/2010   VARICOSE VEINS, LOWER EXTREMITIES 06/02/2010   PLANTAR WART, LEFT 04/03/2009   Benign neoplasm of skin 04/03/2009   HIP PAIN, RIGHT 04/03/2009   Dyslipidemia, goal LDL below 70 06/29/2007   ARTIFICIAL MENOPAUSE 06/29/2007   Pain in limb 06/29/2007   Personal history presenting hazards to health 06/29/2007   Unspecified glaucoma 04/27/2007   Essential hypertension 04/27/2007   Osteoporosis 04/27/2007     Medication Assistance:  None required.  Patient affirms current coverage meets needs.   Assessment / Plan: Hypertension: blood pressure at home usually at goal  - Continue to take metoprolol  SUCCINATE ER 100mg  ONCE a day and amlodipine   5mg  daily  - Continue to check blood pressure 2 to 3 days per week. Reviewed blood pressure goals.  - Reminded patient to rest for at least 5 minutes prior to checking blood pressure and not to talk while checking blood pressure.   Pain - recommended she can continue to use pain cream / Emu or acetaminophen  as needed for hip pain. If pain increases call office to see PCP.   Provided patient with phone number for [facility] on [address] - [phone]  Madelin Ray, PharmD Clinical Pharmacist [facility]  - [facility] [phone]

## 2024-10-07 ENCOUNTER — Other Ambulatory Visit: Payer: Self-pay

## 2024-10-07 NOTE — Patient Instructions (Signed)
 Visit Information  Thank you for taking time to visit with me today. Please don't hesitate to contact me if I can be of assistance to you before our next scheduled appointment.  Your next care management appointment is by telephone on 11/06/24 2:30 pm   Please call the care guide team at [phone] if you need to cancel, schedule, or reschedule an appointment.   Please call the Suicide and Crisis Lifeline: 988 call the [place]  National Suicide Prevention Lifeline: 757 389 4727 or TTY: 938-871-6948 TTY (856) 372-7161) to talk to a trained counselor if you are experiencing a Mental Health or Behavioral Health Crisis or need someone to talk to.  Heddy Shutter, RN, MSN, BSN, CCM [place]  [facility], Population Health Case Manager Phone: [phone]

## 2024-10-07 NOTE — Patient Outreach (Signed)
 Complex Care Management   Visit Note  10/07/2024  Name:  Evelyn [name] MRN: [id number] DOB: 1930-03-02  Situation: Referral received for Complex Care Management related to HTN I obtained verbal consent from Patient.  Visit completed with Patient  on the phone  Background:   Past Medical History:  Diagnosis Date   Atrial fibrillation (HCC)    Hyperlipidemia    Hypertension    Osteoporosis    Post-menopausal    HRT    Assessment: Patient Reported Symptoms:  Cognitive Cognitive Status: No symptoms reported      Neurological Neurological Review of Symptoms: No symptoms reported    HEENT HEENT Symptoms Reported: No symptoms reported      Cardiovascular Cardiovascular Symptoms Reported: No symptoms reported Does patient have uncontrolled Hypertension?: No Is patient checking Blood Pressure at home?: Yes Patient's Recent BP reading at home: paitent reports she is not taking BP as often. RNCM encouraged patient to continue to check and record. Weight: 109 lb (49.4 kg) (patient reports about 2 weeks ago.)  Respiratory Respiratory Symptoms Reported: No symptoms reported    Endocrine Endocrine Symptoms Reported: No symptoms reported    Gastrointestinal Gastrointestinal Symptoms Reported: No symptoms reported Additional Gastrointestinal Details: encouraged patient to continue ot eat heatlhy, eat protein for muschle strength and tone. continue to drink nutritional protein supplement. patient states, I try to drink one a day.      Genitourinary Genitourinary Symptoms Reported: No symptoms reported    Integumentary Integumentary Symptoms Reported: No symptoms reported Other Integumentary Symptoms: reports saw dermatologist recently and had some molds removed.    Musculoskeletal Musculoskelatal Symptoms Reviewed: No symptoms reported Additional Musculoskeletal Details: continues to use assistive device   Falls in the past year?:  (denies fall since last encounter. denies any  falls since February 2025)    Psychosocial Psychosocial Symptoms Reported: No symptoms reported          10/07/2024    PHQ2-9 Depression Screening   Little interest or pleasure in doing things    Feeling down, depressed, or hopeless    PHQ-2 - Total Score    Trouble falling or staying asleep, or sleeping too much    Feeling tired or having little energy    Poor appetite or overeating     Feeling bad about yourself - or that you are a failure or have let yourself or your family down    Trouble concentrating on things, such as reading the newspaper or watching television    Moving or speaking so slowly that other people could have noticed.  Or the opposite - being so fidgety or restless that you have been moving around a lot more than usual    Thoughts that you would be better off dead, or hurting yourself in some way    PHQ2-9 Total Score    If you checked off any problems, how difficult have these problems made it for you to do your work, take care of things at home, or get along with other people    Depression Interventions/Treatment      Vitals:   10/07/24 1525  BP: 138/64  Pulse: 68    Medications Reviewed Today     Reviewed by Shaneeka Scarboro M, RN (Registered Nurse) on 10/07/24 at 1521  Med List Status: <None>   Medication Order Taking? Sig Documenting Provider Last Dose Status Informant  acetaminophen  (QC ACETAMINOPHEN  8 HOURS) 650 MG CR tablet [id number]  Take 650 mg by mouth every 8 (eight) hours  as needed for pain. [provider]  Active   amLODipine  (NORVASC ) 5 MG tablet [id number] Yes Take 1 tablet (5 mg total) by mouth daily. Lowne Chase, Yvonne R, DO  Active   apixaban  (ELIQUIS ) 2.5 MG TABS tablet [id number] Yes Take 1 tablet (2.5 mg total) by mouth 2 (two) times daily.   Active   ascorbic acid (VITAMIN C) 500 MG tablet [id number] Yes Take 500 mg by mouth daily. [provider]  Active Self, Pharmacy Records           Med Note [facility], [facility] B   Wed  Jun 12, 2024  4:38 PM)    calcium carbonate (OS-CAL) 600 MG TABS [id number] Yes Take 600 mg by mouth daily. [provider]  Active Self, Pharmacy Records  Cholecalciferol (VITAMIN D  PO) [id number] Yes Take 1 capsule by mouth daily. 2000 units [provider]  Active Self, Pharmacy Records           Med Note [facility], TAMMY B   Wed Jun 12, 2024  4:38 PM)    Co-Enzyme Q10 100 MG CAPS [id number] Yes Take 100 mg by mouth daily. [provider]  Active   glucosamine-chondroitin 500-400 MG tablet [id number] Yes Take 1 tablet by mouth daily.  [provider]  Active Self, Pharmacy Records  Latanoprostene Bunod [place] OP) [id number] Yes Apply 1 drop to eye once. One drop in each eye per day for glaucoma [provider]  Active Self  Magnesium 400 MG TABS [id number]  Take 400 mg by mouth daily.  Patient not taking: Reported on 10/07/2024   [provider]  Active   metoprolol  succinate (TOPROL -XL) 100 MG 24 hr tablet [id number] Yes Take 1 tablet (100 mg total) by mouth daily. Take with or immediately following a meal. Antonio Meth, Yvonne R, DO  Active   Red Yeast Rice 600 MG TABS [id number] Yes Take 1 tablet by mouth daily. [provider]  Active Self, Pharmacy Records  timolol (TIMOPTIC) 0.5 % ophthalmic solution [id number] Yes 1 drop every morning. [provider]  Active Self, Pharmacy Records          Recommendation:   Continue Current Plan of Care  Follow Up Plan:   Telephone follow up appointment date/time:  11/06/24 2:30pm  Heddy Shutter, RN, MSN, BSN, CCM [place]  [facility], Population Health Case Manager Phone: [phone]

## 2024-10-08 ENCOUNTER — Ambulatory Visit: Admitting: Family Medicine

## 2024-10-17 ENCOUNTER — Other Ambulatory Visit: Payer: Self-pay | Admitting: Cardiovascular Disease

## 2024-10-21 ENCOUNTER — Other Ambulatory Visit: Payer: Self-pay | Admitting: Family Medicine

## 2024-11-04 ENCOUNTER — Other Ambulatory Visit: Payer: Self-pay | Admitting: Family Medicine

## 2024-11-06 ENCOUNTER — Other Ambulatory Visit: Payer: Self-pay

## 2024-11-06 NOTE — Patient Outreach (Signed)
 Complex Care Management   Visit Note  11/06/2024  Name:  Evelyn [name] MRN: [id number] DOB: 09-Nov-1930  Situation: Referral received for Complex Care Management related to HTN. I obtained verbal consent from Patient.  Visit completed with Patient  on the phone  Background:   Past Medical History:  Diagnosis Date   Atrial fibrillation (HCC)    Hyperlipidemia    Hypertension    Osteoporosis    Post-menopausal    HRT    Assessment: Patient Reported Symptoms:  Cognitive Cognitive Status: No symptoms reported      Neurological Neurological Review of Symptoms: No symptoms reported    HEENT HEENT Symptoms Reported: Eye dryness (Uses prescriped drops- helpful) HEENT Management Strategies: Medication therapy HEENT Self-Management Outcome: 4 (good)    Cardiovascular Cardiovascular Symptoms Reported: No symptoms reported Does patient have uncontrolled Hypertension?: No Is patient checking Blood Pressure at home?: Yes Patient's Recent BP reading at home: Checking BP at home. Encouraged to continue to take and record. Weight: 109 lb (49.4 kg) (last weight 11/04/24)  Respiratory Respiratory Symptoms Reported: No symptoms reported    Endocrine Endocrine Symptoms Reported: No symptoms reported Is patient diabetic?: No    Gastrointestinal Gastrointestinal Symptoms Reported: No symptoms reported Additional Gastrointestinal Details: Patient reports regular bowel movements- Last BM 11/06/24      Genitourinary Genitourinary Symptoms Reported: No symptoms reported Additional Genitourinary Details: Patient reports intermittent urine leakage    Integumentary Integumentary Symptoms Reported: No symptoms reported    Musculoskeletal Musculoskelatal Symptoms Reviewed: No symptoms reported Additional Musculoskeletal Details: Uses rollator while in the home.        Psychosocial Psychosocial Symptoms Reported: No symptoms reported         Today's Vitals   11/06/24 1444  BP: 129/69   Pulse: 73  Weight: 109 lb (49.4 kg)   Pain Scale: 0-10 Pain Score: 0-No pain  Medications Reviewed Today     Reviewed by Clint Biello M, RN (Registered Nurse) on 11/06/24 at 1438  Med List Status: <None>   Medication Order Taking? Sig Documenting Provider Last Dose Status Informant  acetaminophen  (QC ACETAMINOPHEN  8 HOURS) 650 MG CR tablet [id number] Yes Take 650 mg by mouth every 8 (eight) hours as needed for pain. [provider]  Active   amLODipine  (NORVASC ) 5 MG tablet [id number] Yes Take 1 tablet (5 mg total) by mouth daily. Antonio Cyndee Rockers R, DO  Active   apixaban  (ELIQUIS ) 2.5 MG TABS tablet [id number] Yes Take 1 tablet (2.5 mg total) by mouth 2 (two) times daily.   Active   ascorbic acid (VITAMIN C) 500 MG tablet [id number] Yes Take 500 mg by mouth daily. [provider]  Active Self, Pharmacy Records           Med Note [facility], [facility] B   Wed Jun 12, 2024  4:38 PM)    calcium carbonate (OS-CAL) 600 MG TABS [id number] Yes Take 600 mg by mouth daily. [provider]  Active Self, Pharmacy Records  Cholecalciferol (VITAMIN D  PO) [id number] Yes Take 1 capsule by mouth daily. 2000 units [provider]  Active Self, Pharmacy Records           Med Note [facility], TAMMY B   Wed Jun 12, 2024  4:38 PM)    Co-Enzyme Q10 100 MG CAPS [id number] Yes Take 100 mg by mouth daily. [provider]  Active   glucosamine-chondroitin 500-400 MG tablet [id number] Yes Take 1 tablet by mouth daily.  [provider]  Active Self, Pharmacy Records  Latanoprostene Bunod [place] OP) [id number] Yes Apply 1 drop to eye once. One drop in each eye per day for glaucoma [provider]  Active Self  Magnesium 400 MG TABS [id number]  Take 400 mg by mouth daily.  Patient not taking: Reported on 11/06/2024   [provider]  Active   metoprolol  succinate (TOPROL -XL) 100 MG 24 hr tablet [id number] Yes TAKE 1 TABLET(100 MG) BY MOUTH DAILY WITH OR  IMMEDIATELY FOLLOWING A MEAL Lowne Chase, Yvonne R, DO  Active   Red Yeast Rice 600 MG TABS [id number] Yes Take 1 tablet by mouth daily. [provider]  Active Self, Pharmacy Records  timolol (TIMOPTIC) 0.5 % ophthalmic solution [id number] Yes 1 drop every morning. [provider]  Active Self, Pharmacy Records            Recommendation:   Continue Current Plan of Care  Follow Up Plan:   Telephone follow up appointment date/time:  12/09/24 at 1 pm  Heddy Shutter, RN, MSN, BSN, CCM [place]  [facility], Population Health Case Manager Phone: [phone]

## 2024-11-06 NOTE — Patient Instructions (Signed)
 Visit Information  Thank you for taking time to visit with me today. Please don't hesitate to contact me if I can be of assistance to you before our next scheduled appointment.  Your next care management appointment is by telephone on 12/09/24 at 1 pm  Please call the care guide team at [phone] if you need to cancel, schedule, or reschedule an appointment.   Please call the Suicide and Crisis Lifeline: 988 call the [place]  National Suicide Prevention Lifeline: (317)826-9338 or TTY: (780) 047-5356 TTY (909) 059-7405) to talk to a trained counselor if you are experiencing a Mental Health or Behavioral Health Crisis or need someone to talk to.  Heddy Shutter, RN, MSN, BSN, CCM [place]  [facility], Population Health Case Manager Phone: [phone]

## 2024-11-13 ENCOUNTER — Other Ambulatory Visit (INDEPENDENT_AMBULATORY_CARE_PROVIDER_SITE_OTHER): Admitting: Pharmacist

## 2024-11-13 DIAGNOSIS — I10 Essential (primary) hypertension: Secondary | ICD-10-CM

## 2024-11-13 NOTE — Progress Notes (Signed)
 Pharmacy Note  11/13/2024 Name: Evelyn [name] MRN: [id number] DOB: 1930-05-04  Subjective: Evelyn [name] Evelyn [name] is a 89 y.o. year old female who is a primary care patient of Evelyn [name], Evelyn SAUNDERS, Evelyn [name]. Clinical Pharmacist Practitioner referral was placed to assist with medication management.    Engaged with patient by telephone to follow up hypertension   Hypertension Current blood pressure medications:  metoprolol  succinate ER 100mg  once a day - Last refill was for 30 day supply today 11/13/2024 but patient states she has not picked up yet because she is having car trouble. She is going out of town 11/15/2024 for [place].   amlodipine  5mg  daily - last refill was for 30 day supply on 11/04/2024  Patient is checking about 2 or 3 times per week. Recent blood pressure readings Today 129/77 and HR 88 11/16 - 132/74 and HR 68 11/15 - 141/74 and HR 63   BP Readings from Last 3 Encounters:  11/13/24 129/77  11/06/24 129/69  10/07/24 138/64   Patient denies dizziness or lightheadedness.   Objective: Review of patient status, including review of consultants reports, laboratory and other test data, was performed as part of comprehensive.  Lab Results  Component Value Date   CREATININE 0.64 03/05/2024   CREATININE 0.77 01/23/2024   CREATININE 0.91 01/22/2024    No results found for: HGBA1C     Component Value Date/Time   CHOL 177 03/05/2024 1353   TRIG 110.0 03/05/2024 1353   HDL 61.70 03/05/2024 1353   CHOLHDL 3 03/05/2024 1353   VLDL 22.0 03/05/2024 1353   LDLCALC 93 03/05/2024 1353   LDLDIRECT 130.5 03/26/2013 0916     Clinical ASCVD: No  The ASCVD Risk score (Arnett DK, et al., 2019) failed to calculate for the following reasons:   The 2019 ASCVD risk score is only valid for ages 16 to 6   * - Cholesterol units were assumed    BP Readings from Last 3 Encounters:  11/13/24 129/77  11/06/24 129/69  10/07/24 138/64      Allergies  Allergen Reactions   Vicodin [Hydrocodone-Acetaminophen ] Other (See Comments)    Abdominal Pain   Pletaal [Cilostazol ] Diarrhea   Statins     Medications Reviewed Today     Reviewed by Evelyn [name], RPH-CPP (Pharmacist) on 11/13/24 at 1419  Med List Status: <None>   Medication Order Taking? Sig Documenting Provider Last Dose Status Informant  acetaminophen  (QC ACETAMINOPHEN  8 HOURS) 650 MG CR tablet [id number]  Take 650 mg by mouth every 8 (eight) hours as needed for pain. [provider]  Active   amLODipine  (NORVASC ) 5 MG tablet [id number] Yes Take 1 tablet (5 mg total) by mouth daily. Evelyn [name], Evelyn [name], Evelyn [name]  Active   apixaban  (ELIQUIS ) 2.5 MG TABS tablet [id number] Yes Take 1 tablet (2.5 mg total) by mouth 2 (two) times daily.   Active   ascorbic acid (VITAMIN C) 500 MG tablet [id number]  Take 500 mg by mouth daily. [provider]  Active Self, Pharmacy Records           Med Note [facility], Evelyn [name]   Wed Jun 12, 2024  4:38 PM)    calcium carbonate (OS-CAL) 600 MG TABS [id number]  Take 600 mg by mouth daily. [provider]  Active Self, Pharmacy Records  Cholecalciferol (VITAMIN D  PO) [id number] Yes Take 1 capsule by mouth daily. 2000 units [provider]  Active Self, Pharmacy Records  Med Note Evelyn [name], Evelyn [name]   Wed Jun 12, 2024  4:38 PM)    Co-Enzyme Q10 100 MG CAPS [id number]  Take 100 mg by mouth daily. [provider]  Active   glucosamine-chondroitin 500-400 MG tablet [id number]  Take 1 tablet by mouth daily.  [provider]  Active Self, Pharmacy Records  Latanoprostene Bunod [place] OP) [id number] Yes Apply 1 drop to eye once. One drop in each eye per day for glaucoma [provider]  Active Self  Magnesium 400 MG TABS [id number]  Take 400 mg by mouth daily.  Patient not taking: Reported on 11/06/2024   [provider]  Active   metoprolol  succinate (TOPROL -XL) 100 MG 24 hr tablet  [id number] Yes TAKE 1 TABLET(100 MG) BY MOUTH DAILY WITH OR IMMEDIATELY FOLLOWING A MEAL Evelyn [name], Evelyn [name], Evelyn [name]  Active   Red Yeast Rice 600 MG TABS [id number] Yes Take 1 tablet by mouth daily. [provider]  Active Self, Pharmacy Records  timolol (TIMOPTIC) 0.5 % ophthalmic solution [id number] Yes 1 drop every morning. [provider]  Active Self, Pharmacy Records            Patient Active Problem List   Diagnosis Date Noted   Closed displaced intertrochanteric fracture of left femur (HCC) 01/17/2024   Fall 01/17/2024   Leukocytosis 01/17/2024   Change in bowel function 02/01/2022   Hyperlipidemia 04/14/2020   Seasonal allergies 11/27/2019   Persistent atrial fibrillation (HCC) 03/26/2019   Anticoagulated 03/26/2019   Loose stools 04/14/2015   Osteoarthritis 04/14/2015   Chronic flank pain 06/17/2013   Plantar fasciitis 05/25/2013   PAD (peripheral artery disease) 03/07/2013   Acute hip pain, left 02/05/2013   CARDIAC MURMUR 06/29/2010   VARICOSE VEINS, LOWER EXTREMITIES 06/02/2010   PLANTAR WART, LEFT 04/03/2009   Benign neoplasm of skin 04/03/2009   HIP PAIN, RIGHT 04/03/2009   Dyslipidemia, goal LDL below 70 06/29/2007   ARTIFICIAL MENOPAUSE 06/29/2007   Pain in limb 06/29/2007   Personal history presenting hazards to health 06/29/2007   Unspecified glaucoma 04/27/2007   Essential hypertension 04/27/2007   Osteoporosis 04/27/2007     Medication Assistance:  None required.  Patient affirms current coverage meets needs.   Assessment / Plan: Hypertension: blood pressure at home usually at goal  - Continue to take metoprolol  SUCCINATE ER 100mg  ONCE a day and amlodipine  5mg  daily  - Continue to check blood pressure 2 to 3 days per week. Reviewed blood pressure goals.  - Reminded patient to rest for at least 5 minutes prior to checking blood pressure and not to talk while checking blood pressure.  - Coordinated with pharmacy to have metoprolol   delivered out to her today.   Patient is due to see PCP - tried to make appointment for her today but patient states she will be out of town thru the [place] and she is not sure when she will return home. She will call when she is back in town.   Madelin Ray, PharmD Clinical Pharmacist [facility]  - [facility] [phone]

## 2024-12-09 ENCOUNTER — Other Ambulatory Visit: Payer: Self-pay

## 2024-12-09 NOTE — Patient Outreach (Signed)
 Complex Care Management   Visit Note  12/09/2024  Name:  Evelyn [name] MRN: [id number] DOB: 15-Aug-1930  Situation: Referral received for Complex Care Management related to HTN I obtained verbal consent from Patient.  Visit completed with Patient  on the phone  Background:   Past Medical History:  Diagnosis Date   Atrial fibrillation (HCC)    Hyperlipidemia    Hypertension    Osteoporosis    Post-menopausal    HRT    Assessment: Patient reports she is doing well. She reports she went out of town for [employer]. She denies any questions or concerns at this time. Patient states she has not checked BP/HR since returning home, but states she will check and record. Also patient states she will call to reschedule follow up visit with PCP. Patient denies any care management needs or need for follow up at this time. Patient has a follow up telephone scheduled with clinical pharmacist on 12/25/24. RNCM will update clinical pharmacist who continues to follow. Patient Reported Symptoms:  Cognitive Cognitive Status: No symptoms reported, Alert and oriented to person, place, and time      Neurological Neurological Review of Symptoms: No symptoms reported    HEENT HEENT Symptoms Reported: No symptoms reported (patient reports she continue to follow up with eye doctor as recommended.)      Cardiovascular Cardiovascular Symptoms Reported: No symptoms reported Does patient have uncontrolled Hypertension?: No Is patient checking Blood Pressure at home?: Yes Patient's Recent BP reading at home: patient reports she has not checked BP/HR since [place] Weight: 108 lb (49 kg) (reports last weighed 12/08/24)  Respiratory Respiratory Symptoms Reported: No symptoms reported    Endocrine Endocrine Symptoms Reported: No symptoms reported Is patient diabetic?: No    Gastrointestinal Gastrointestinal Symptoms Reported: No symptoms reported      Genitourinary Genitourinary Symptoms  Reported: No symptoms reported    Integumentary Integumentary Symptoms Reported: No symptoms reported    Musculoskeletal Musculoskelatal Symptoms Reviewed: No symptoms reported Additional Musculoskeletal Details: reports uses Rollator walker and/or cane as needed.        Psychosocial Psychosocial Symptoms Reported: No symptoms reported          Today's Vitals   12/09/24 1322  Weight: 108 lb (49 kg)      Medications Reviewed Today     Reviewed by Zani Kyllonen M, RN (Registered Nurse) on 12/09/24 at 1314  Med List Status: <None>   Medication Order Taking? Sig Documenting Provider Last Dose Status Informant  acetaminophen  (QC ACETAMINOPHEN  8 HOURS) 650 MG CR tablet [id number] Yes Take 650 mg by mouth every 8 (eight) hours as needed for pain. [provider]  Active   amLODipine  (NORVASC ) 5 MG tablet [id number] Yes Take 1 tablet (5 mg total) by mouth daily. Lowne Chase, Yvonne R, DO  Active   apixaban  (ELIQUIS ) 2.5 MG TABS tablet [id number] Yes Take 1 tablet (2.5 mg total) by mouth 2 (two) times daily.   Active   ascorbic acid (VITAMIN C) 500 MG tablet [id number] Yes Take 500 mg by mouth daily. [provider]  Active Self, Pharmacy Records           Med Note [facility], [facility] B   Wed Jun 12, 2024  4:38 PM)    calcium carbonate (OS-CAL) 600 MG TABS [id number] Yes Take 600 mg by mouth daily. [provider]  Active Self, Pharmacy Records  Cholecalciferol (VITAMIN D  PO) [id number] Yes Take 1 capsule by mouth  daily. 2000 units [provider]  Active Self, Pharmacy Records           Med Note [facility], TAMMY B   Wed Jun 12, 2024  4:38 PM)    Co-Enzyme Q10 100 MG CAPS [id number] Yes Take 100 mg by mouth daily. [provider]  Active   glucosamine-chondroitin 500-400 MG tablet [id number] Yes Take 1 tablet by mouth daily.  [provider]  Active Self, Pharmacy Records  Latanoprostene Bunod [place] OP) [id number] Yes Apply 1 drop to eye once. One  drop in each eye per day for glaucoma [provider]  Active Self  Magnesium 400 MG TABS [id number]  Take 400 mg by mouth daily.  Patient not taking: Reported on 12/09/2024   [provider]  Active   metoprolol  succinate (TOPROL -XL) 100 MG 24 hr tablet [id number] Yes TAKE 1 TABLET(100 MG) BY MOUTH DAILY WITH OR IMMEDIATELY FOLLOWING A MEAL Lowne Chase, Yvonne R, DO  Active   Red Yeast Rice 600 MG TABS [id number] Yes Take 1 tablet by mouth daily. [provider]  Active Self, Pharmacy Records  timolol (TIMOPTIC) 0.5 % ophthalmic solution [id number] Yes 1 drop every morning. [provider]  Active Self, Pharmacy Records          Recommendation:   Patient to resume blood pressure monitoring and Patient to call to reschedule follow up with PCP  Follow Up Plan:  RNCM sent message to clinical pharmacist to update. Patient has met all care management goals. Care Management case will be closed. Patient has been provided contact information should new needs arise.   Heddy Shutter, RN, MSN, BSN, CCM [place]  [facility], Population Health Case Manager Phone: [phone]

## 2024-12-09 NOTE — Patient Instructions (Signed)
 Visit Information  Thank you for taking time to visit with me today. Please don't hesitate to contact me if I can be of assistance to you.  Patient has met all care management goals. Care Management case will be closed. Patient has been provided contact information should new needs arise.   Please call the care guide team at [phone] if you need to cancel, schedule, or reschedule an appointment.   Please call the Suicide and Crisis Lifeline: 988 call the [place]  National Suicide Prevention Lifeline: 802-509-6071 or TTY: (539)641-3168 TTY (317) 704-3669) to talk to a trained counselor if you are experiencing a Mental Health or Behavioral Health Crisis or need someone to talk to.  Lindi Revering, RN, MSN, BSN, CCM [place]  [facility], Population Health Case Manager Phone: [phone]

## 2024-12-19 ENCOUNTER — Encounter: Payer: Self-pay | Admitting: Family Medicine

## 2024-12-19 ENCOUNTER — Other Ambulatory Visit (HOSPITAL_BASED_OUTPATIENT_CLINIC_OR_DEPARTMENT_OTHER): Payer: Self-pay

## 2024-12-19 ENCOUNTER — Ambulatory Visit: Admitting: Family Medicine

## 2024-12-19 VITALS — BP 142/90 | HR 58 | Temp 98.9°F | Resp 18 | Ht 62.0 in | Wt 104.6 lb

## 2024-12-19 DIAGNOSIS — I4819 Other persistent atrial fibrillation: Secondary | ICD-10-CM

## 2024-12-19 DIAGNOSIS — E559 Vitamin D deficiency, unspecified: Secondary | ICD-10-CM

## 2024-12-19 DIAGNOSIS — I4821 Permanent atrial fibrillation: Secondary | ICD-10-CM

## 2024-12-19 DIAGNOSIS — E785 Hyperlipidemia, unspecified: Secondary | ICD-10-CM | POA: Diagnosis not present

## 2024-12-19 DIAGNOSIS — I10 Essential (primary) hypertension: Secondary | ICD-10-CM | POA: Diagnosis not present

## 2024-12-19 MED ORDER — RSVPREF3 VAC RECOMB ADJUVANTED 120 MCG/0.5ML IM SUSR
0.5000 mL | Freq: Once | INTRAMUSCULAR | 0 refills | Status: AC
  Filled 2024-12-19: qty 0.5, 1d supply, fill #0

## 2024-12-19 MED ORDER — LOSARTAN POTASSIUM 50 MG PO TABS: 50.0000 mg | ORAL_TABLET | Freq: Every day | ORAL | 1 refills | Status: AC

## 2024-12-19 NOTE — Progress Notes (Signed)
 "  Subjective:    Patient ID: Evelyn [name], female    DOB: Nov 03, 1930, 89 y.o.   MRN: [id number]  Chief Complaint  Patient presents with   Hypertension   Follow-up    HPI Patient is in today for f/u bp.  Discussed the use of AI scribe software for clinical note transcription with the patient, who gave verbal consent to proceed.  History of Present Illness Evelyn [name] is a 89 year old female with hypertension who presents with elevated blood pressure.  Her blood pressure has been fluctuating, with a low reading of 137/71 mmHg last night, but it has been higher at times, including during a previous visit in June. She is unsure if she has ever taken losartan .  She has splotchy legs with broken veins and experiences occasional leg cramps. No recent anxiety or swelling.  She has had a crick in her neck for the past week, which she attributes to possible arthritis or sleeping position. She applied Arnica, which seemed to help, and reports improvement in discomfort.  She is recovering from a broken hip sustained from a fall off a two-step ladder. She uses a cane for support but is driving and managing daily activities independently. She takes glucosamine, calcium, and vitamin D , which she feels have helped her bones.  She has not had the flu this year and has received a flu shot. She has had the pneumonia shot. She mentions some difficulty hearing, particularly with one daughter.  She recently traveled to [place] for two weeks to visit family and has family in [place]. She is independent in daily activities but uses a cane for support.    Past Medical History:  Diagnosis Date   Atrial fibrillation (HCC)    Hyperlipidemia    Hypertension    Osteoporosis    Post-menopausal    HRT    Past Surgical History:  Procedure Laterality Date   ABDOMINAL HYSTERECTOMY     BREAST LUMPECTOMY  11/28/1973   EYE SURGERY     Cataract removal   FEMUR IM NAIL Left 01/18/2024   Procedure:  LEFT FEMUR CEPHALOMEDULLARY NAIL;  Surgeon: Germaine Redbird, MD;  Location: [facility] OR;  Service: Orthopedics;  Laterality: Left;  Hana table, C-arm, Synthes    Family History  Problem Relation Age of Onset   Arthritis Mother    Heart disease Mother    Heart attack Father 29   Prostate cancer Father    COPD Father    Glaucoma Father    Hypertension Sister    COPD Sister    Arthritis Sister     Social History   Socioeconomic History   Marital status: Widowed    Spouse name: Not on file   Number of children: Not on file   Years of education: Not on file   Highest education level: Not on file  Occupational History   Not on file  Tobacco Use   Smoking status: Former    Current packs/day: 0.00    Average packs/day: 0.8 packs/day for 18.0 years (13.5 ttl pk-yrs)    Types: Cigarettes    Start date: 02/21/1955    Quit date: 02/20/1973    Years since quitting: 51.8   Smokeless tobacco: Never  Substance and Sexual Activity   Alcohol use: Yes   Drug use: No   Sexual activity: Not Currently    Partners: Male  Other Topics Concern   Not on file  Social History Narrative   Exercise--yard work,  house work   Social Drivers of Health   Tobacco Use: Medium Risk (12/19/2024)   Patient History    Smoking Tobacco Use: Former    Smokeless Tobacco Use: Never    Passive Exposure: Not on file  Financial Resource Strain: Low Risk (05/14/2024)   Overall Financial Resource Strain (CARDIA)    Difficulty of Paying Living Expenses: Not hard at all  Food Insecurity: No Food Insecurity (12/09/2024)   Epic    Worried About Programme Researcher, Broadcasting/film/video in the Last Year: Never true    Ran Out of Food in the Last Year: Never true  Transportation Needs: No Transportation Needs (12/09/2024)   Epic    Lack of Transportation (Medical): No    Lack of Transportation (Non-Medical): No  Physical Activity: Inactive (05/14/2024)   Exercise Vital Sign    Days of Exercise per Week: 0 days    Minutes of Exercise per  Session: 0 min  Stress: No Stress Concern Present (05/14/2024)   [employer] of Occupational Health - Occupational Stress Questionnaire    Feeling of Stress: Not at all  Social Connections: Moderately Integrated (05/14/2024)   Social Connection and Isolation Panel    Frequency of Communication with Friends and Family: More than three times a week    Frequency of Social Gatherings with Friends and Family: Once a week    Attends Religious Services: More than 4 times per year    Active Member of [employer] or Organizations: Yes    Attends Banker Meetings: More than 4 times per year    Marital Status: Widowed  Intimate Partner Violence: Not At Risk (12/09/2024)   Epic    Fear of Current or Ex-Partner: No    Emotionally Abused: No    Physically Abused: No    Sexually Abused: No  Depression (PHQ2-9): Low Risk (05/14/2024)   Depression (PHQ2-9)    PHQ-2 Score: 0  Alcohol Screen: Low Risk (05/14/2024)   Alcohol Screen    Last Alcohol Screening Score (AUDIT): 0  Housing: Low Risk (12/09/2024)   Epic    Unable to Pay for Housing in the Last Year: No    Number of Times Moved in the Last Year: 0    Homeless in the Last Year: No  Utilities: Not At Risk (12/09/2024)   Epic    Threatened with loss of utilities: No  Health Literacy: Adequate Health Literacy (05/14/2024)   B1300 Health Literacy    Frequency of need for help with medical instructions: Never    Outpatient Medications Prior to Visit  Medication Sig Dispense Refill   acetaminophen  (QC ACETAMINOPHEN  8 HOURS) 650 MG CR tablet Take 650 mg by mouth every 8 (eight) hours as needed for pain.     amLODipine  (NORVASC ) 5 MG tablet Take 1 tablet (5 mg total) by mouth daily. 90 tablet 0   apixaban  (ELIQUIS ) 2.5 MG TABS tablet Take 1 tablet (2.5 mg total) by mouth 2 (two) times daily. 180 tablet 1   ascorbic acid (VITAMIN C) 500 MG tablet Take 500 mg by mouth daily.     calcium carbonate (OS-CAL) 600 MG TABS Take 600 mg by mouth  daily.     Cholecalciferol (VITAMIN D  PO) Take 1 capsule by mouth daily. 2000 units     Co-Enzyme Q10 100 MG CAPS Take 100 mg by mouth daily.     glucosamine-chondroitin 500-400 MG tablet Take 1 tablet by mouth daily.      Latanoprostene Bunod (VYZULTA OP) Apply 1  drop to eye once. One drop in each eye per day for glaucoma     Red Yeast Rice 600 MG TABS Take 1 tablet by mouth daily.     timolol (TIMOPTIC) 0.5 % ophthalmic solution 1 drop every morning.     metoprolol  succinate (TOPROL -XL) 100 MG 24 hr tablet TAKE 1 TABLET(100 MG) BY MOUTH DAILY WITH OR IMMEDIATELY FOLLOWING A MEAL 30 tablet 0   Magnesium 400 MG TABS Take 400 mg by mouth daily. (Patient not taking: Reported on 12/19/2024)     No facility-administered medications prior to visit.    Allergies  Allergen Reactions   Vicodin [Hydrocodone-Acetaminophen ] Other (See Comments)    Abdominal Pain   Pletaal [Cilostazol ] Diarrhea   Statins     Review of Systems  Constitutional:  Negative for fever and malaise/fatigue.  HENT:  Negative for congestion.   Eyes:  Negative for blurred vision.  Respiratory:  Negative for shortness of breath.   Cardiovascular:  Negative for chest pain, palpitations and leg swelling.  Gastrointestinal:  Negative for abdominal pain, blood in stool and nausea.  Genitourinary:  Negative for dysuria and frequency.  Musculoskeletal:  Negative for falls.  Skin:  Negative for rash.  Neurological:  Negative for dizziness, loss of consciousness and headaches.  Endo/Heme/Allergies:  Negative for environmental allergies.  Psychiatric/Behavioral:  Negative for depression. The patient is not nervous/anxious.        Objective:    Physical Exam Vitals and nursing note reviewed.  Constitutional:      General: She is not in acute distress.    Appearance: Normal appearance. She is well-developed.  HENT:     Head: Normocephalic and atraumatic.  Eyes:     General: No scleral icterus.       Right eye: No  discharge.        Left eye: No discharge.  Cardiovascular:     Rate and Rhythm: Normal rate and regular rhythm.     Heart sounds: No murmur heard. Pulmonary:     Effort: Pulmonary effort is normal. No respiratory distress.     Breath sounds: Normal breath sounds.  Musculoskeletal:        General: Normal range of motion.     Cervical back: Normal range of motion and neck supple.     Right lower leg: No edema.     Left lower leg: No edema.  Skin:    General: Skin is warm and dry.  Neurological:     Mental Status: She is alert and oriented to person, place, and time.  Psychiatric:        Mood and Affect: Mood normal.        Behavior: Behavior normal.        Thought Content: Thought content normal.        Judgment: Judgment normal.     BP (!) 142/90 (BP Location: Left Arm, Patient Position: Sitting, Cuff Size: Normal)   Pulse (!) 58   Temp 98.9 F (37.2 C) (Oral)   Resp 18   Ht 5' 2 (1.575 m)   Wt 104 lb 9.6 oz (47.4 kg)   SpO2 97%   BMI 19.13 kg/m  Wt Readings from Last 3 Encounters:  12/19/24 104 lb 9.6 oz (47.4 kg)  12/09/24 108 lb (49 kg)  11/06/24 109 lb (49.4 kg)    Diabetic Foot Exam - Simple   No data filed    Lab Results  Component Value Date   WBC 9.1 12/19/2024   HGB  13.3 12/19/2024   HCT 38.9 12/19/2024   PLT 150.0 12/19/2024   GLUCOSE 93 12/19/2024   CHOL 188 12/19/2024   TRIG 88.0 12/19/2024   HDL 66.80 12/19/2024   LDLDIRECT 130.5 03/26/2013   LDLCALC 103 (H) 12/19/2024   ALT 14 12/19/2024   AST 19 12/19/2024   NA 137 12/19/2024   K 4.1 12/19/2024   CL 99 12/19/2024   CREATININE 0.73 12/19/2024   BUN 15 12/19/2024   CO2 29 12/19/2024   TSH 2.62 12/19/2024   INR 1.2 01/17/2024    Lab Results  Component Value Date   TSH 2.62 12/19/2024   Lab Results  Component Value Date   WBC 9.1 12/19/2024   HGB 13.3 12/19/2024   HCT 38.9 12/19/2024   MCV 93.5 12/19/2024   PLT 150.0 12/19/2024   Lab Results  Component Value Date   NA 137  12/19/2024   K 4.1 12/19/2024   CO2 29 12/19/2024   GLUCOSE 93 12/19/2024   BUN 15 12/19/2024   CREATININE 0.73 12/19/2024   BILITOT 1.3 (H) 12/19/2024   ALKPHOS 71 12/19/2024   AST 19 12/19/2024   ALT 14 12/19/2024   PROT 8.0 12/19/2024   ALBUMIN 4.3 12/19/2024   CALCIUM 9.8 12/19/2024   ANIONGAP 9 01/23/2024   GFR 71.12 12/19/2024   Lab Results  Component Value Date   CHOL 188 12/19/2024   Lab Results  Component Value Date   HDL 66.80 12/19/2024   Lab Results  Component Value Date   LDLCALC 103 (H) 12/19/2024   Lab Results  Component Value Date   TRIG 88.0 12/19/2024   Lab Results  Component Value Date   CHOLHDL 3 12/19/2024   No results found for: HGBA1C     Assessment & Plan:  Essential hypertension Assessment & Plan: Well controlled, no changes to meds. Encouraged heart healthy diet such as the DASH diet and exercise as tolerated.    Orders: -     Losartan  Potassium; Take 1 tablet (50 mg total) by mouth daily.  Dispense: 90 tablet; Refill: 1 -     CBC with Differential/Platelet -     Comprehensive metabolic panel with GFR -     Lipid panel  Permanent atrial fibrillation (HCC) -     TSH  Dyslipidemia, goal LDL below 70 Assessment & Plan: Encourage heart healthy diet such as MIND or DASH diet, increase exercise, avoid trans fats, simple carbohydrates and processed foods, consider a krill or fish or flaxseed oil cap daily.    Orders: -     Comprehensive metabolic panel with GFR -     Lipid panel  Vitamin D  deficiency -     VITAMIN D  25 Hydroxy (Vit-D Deficiency, Fractures)  Persistent atrial fibrillation (HCC) Assessment & Plan: On eliquis  Per cardiology   Hyperlipidemia, unspecified hyperlipidemia type Assessment & Plan: Encourage heart healthy diet such as MIND or DASH diet, increase exercise, avoid trans fats, simple carbohydrates and processed foods, consider a krill or fish or flaxseed oil cap daily.     Assessment and  Plan Assessment & Plan Essential hypertension   Blood pressure is elevated today, though it was lower last night at 137/71 mmHg. It has been variable, with previous high readings in June. No recent anxiety or significant stressors reported. No swelling noted, but leg cramps and splotchy veins are present, possibly related to PAD. Current medication regimen may need adjustment. Added losartan  to the current blood pressure regimen. Recheck blood pressure in three weeks.  Vitamin D  deficiency   She is taking calcium and vitamin D  supplements. No recent bone density test performed, and she is not interested in undergoing one. Glucosamine and chondroitin supplements are being taken, which she feels have helped with joint pain. Continue current supplementation with calcium and vitamin D . Consider RSV vaccination at the pharmacy.    Somya Jauregui R Lowne Chase, DO "

## 2024-12-19 NOTE — Patient Instructions (Signed)

## 2024-12-20 ENCOUNTER — Telehealth: Payer: Self-pay

## 2024-12-20 LAB — CBC WITH DIFFERENTIAL/PLATELET
Basophils Absolute: 0.2 K/uL — ABNORMAL HIGH (ref 0.0–0.1)
Basophils Relative: 2.2 % (ref 0.0–3.0)
Eosinophils Absolute: 0.1 K/uL (ref 0.0–0.7)
Eosinophils Relative: 0.7 % (ref 0.0–5.0)
HCT: 38.9 % (ref 36.0–46.0)
Hemoglobin: 13.3 g/dL (ref 12.0–15.0)
Lymphocytes Relative: 19.4 % (ref 12.0–46.0)
Lymphs Abs: 1.8 K/uL (ref 0.7–4.0)
MCHC: 34.2 g/dL (ref 30.0–36.0)
MCV: 93.5 fl (ref 78.0–100.0)
Monocytes Absolute: 1.1 K/uL — ABNORMAL HIGH (ref 0.1–1.0)
Monocytes Relative: 11.7 % (ref 3.0–12.0)
Neutro Abs: 6 K/uL (ref 1.4–7.7)
Neutrophils Relative %: 66 % (ref 43.0–77.0)
Platelets: 150 K/uL (ref 150.0–400.0)
RBC: 4.16 Mil/uL (ref 3.87–5.11)
RDW: 14.5 % (ref 11.5–15.5)
WBC: 9.1 K/uL (ref 4.0–10.5)

## 2024-12-20 LAB — COMPREHENSIVE METABOLIC PANEL WITH GFR
ALT: 14 U/L (ref 3–35)
AST: 19 U/L (ref 5–37)
Albumin: 4.3 g/dL (ref 3.5–5.2)
Alkaline Phosphatase: 71 U/L (ref 39–117)
BUN: 15 mg/dL (ref 6–23)
CO2: 29 meq/L (ref 19–32)
Calcium: 9.8 mg/dL (ref 8.4–10.5)
Chloride: 99 meq/L (ref 96–112)
Creatinine, Ser: 0.73 mg/dL (ref 0.40–1.20)
GFR: 71.12 mL/min
Glucose, Bld: 93 mg/dL (ref 70–99)
Potassium: 4.1 meq/L (ref 3.5–5.1)
Sodium: 137 meq/L (ref 135–145)
Total Bilirubin: 1.3 mg/dL — ABNORMAL HIGH (ref 0.2–1.2)
Total Protein: 8 g/dL (ref 6.0–8.3)

## 2024-12-20 LAB — VITAMIN D 25 HYDROXY (VIT D DEFICIENCY, FRACTURES): VITD: 85.65 ng/mL (ref 30.00–100.00)

## 2024-12-20 LAB — TSH: TSH: 2.62 u[IU]/mL (ref 0.35–5.50)

## 2024-12-20 LAB — LIPID PANEL
Cholesterol: 188 mg/dL (ref 28–200)
HDL: 66.8 mg/dL
LDL Cholesterol: 103 mg/dL — ABNORMAL HIGH (ref 10–99)
NonHDL: 120.88
Total CHOL/HDL Ratio: 3
Triglycerides: 88 mg/dL (ref 10.0–149.0)
VLDL: 17.6 mg/dL (ref 0.0–40.0)

## 2024-12-20 MED ORDER — METOPROLOL SUCCINATE ER 100 MG PO TB24: 100.0000 mg | ORAL_TABLET | Freq: Every day | ORAL | 0 refills | Status: AC

## 2024-12-20 NOTE — Telephone Encounter (Signed)
 Copied from CRM #[id number]. Topic: Clinical - Medication Refill >> Dec 20, 2024  1:25 PM Alexandria E wrote: Medication: metoprolol  succinate (TOPROL -XL) 100 MG 24 hr tablet  Has the patient contacted their pharmacy? No (Agent: If no, request that the patient contact the pharmacy for the refill. If patient does not wish to contact the pharmacy document the reason why and proceed with request.) (Agent: If yes, when and what did the pharmacy advise?)  This is the patient's preferred pharmacy:  [id number], [place] - [address] AT [facility] [address] [place] [address] Phone: [phone] Fax: [phone]   Is this the correct pharmacy for this prescription? Yes If no, delete pharmacy and type the correct one.   Has the prescription been filled recently? No  Is the patient out of the medication? Yes, requesting pick up today if possible.  Has the patient been seen for an appointment in the last year OR does the patient have an upcoming appointment? Yes  Can we respond through MyChart? No  Agent: Please be advised that Rx refills may take up to 3 business days. We ask that you follow-up with your pharmacy.

## 2024-12-22 ENCOUNTER — Ambulatory Visit: Payer: Self-pay | Admitting: Family Medicine

## 2024-12-22 NOTE — Assessment & Plan Note (Signed)
 Encourage heart healthy diet such as MIND or DASH diet, increase exercise, avoid trans fats, simple carbohydrates and processed foods, consider a krill or fish or flaxseed oil cap daily.

## 2024-12-22 NOTE — Assessment & Plan Note (Signed)
 Well controlled, no changes to meds. Encouraged heart healthy diet such as the DASH diet and exercise as tolerated.

## 2024-12-22 NOTE — Assessment & Plan Note (Signed)
On eliquis  °Per cardiology °

## 2024-12-25 ENCOUNTER — Other Ambulatory Visit: Admitting: Pharmacist

## 2024-12-25 DIAGNOSIS — I10 Essential (primary) hypertension: Secondary | ICD-10-CM

## 2024-12-25 NOTE — Progress Notes (Signed)
 "                                        Pharmacy Note  12/25/2024 Name: Evelyn [name] MRN: [id number] DOB: 11-14-1930  Subjective: Evelyn [name] Evelyn [name] is a 89 y.o. year old female who is a primary care patient of Antonio Meth, Jamee SAUNDERS, DO. Clinical Pharmacist Practitioner referral was placed to assist with medication management.    Engaged with patient by telephone to follow up hypertension   Hypertension Current blood pressure medications:  metoprolol  succinate ER 100mg  once a day  amlodipine  5mg  daily  Losartan  50mg  daily - added to regimen 12/19/2024 but she did not take first dose until 12/22/2024  Patient is checking about 2 or 3 times per week. Recent blood pressure readings  BP Readings from Last 3 Encounters:  12/19/24 (!) 142/90  11/13/24 129/77  11/06/24 129/69   Patient denies dizziness or lightheadedness.   Objective: Review of patient status, including review of consultants reports, laboratory and other test data, was performed as part of comprehensive.  Lab Results  Component Value Date   CREATININE 0.73 12/19/2024   CREATININE 0.64 03/05/2024   CREATININE 0.77 01/23/2024    No results found for: HGBA1C     Component Value Date/Time   CHOL 188 12/19/2024 1422   TRIG 88.0 12/19/2024 1422   HDL 66.80 12/19/2024 1422   CHOLHDL 3 12/19/2024 1422   VLDL 17.6 12/19/2024 1422   LDLCALC 103 (H) 12/19/2024 1422   LDLDIRECT 130.5 03/26/2013 0916     Clinical ASCVD: No  The ASCVD Risk score (Arnett DK, et al., 2019) failed to calculate for the following reasons:   The 2019 ASCVD risk score is only valid for ages 52 to 20   * - Cholesterol units were assumed    BP Readings from Last 3 Encounters:  12/19/24 (!) 142/90  11/13/24 129/77  11/06/24 129/69     Allergies  Allergen Reactions   Vicodin [Hydrocodone-Acetaminophen ] Other (See Comments)    Abdominal Pain   Pletaal [Cilostazol ] Diarrhea   Statins     Medications Reviewed Today     Reviewed by  Carla Milling, RPH-CPP (Pharmacist) on 12/25/24 at 1409  Med List Status: <None>   Medication Order Taking? Sig Documenting Provider Last Dose Status Informant  acetaminophen  (QC ACETAMINOPHEN  8 HOURS) 650 MG CR tablet [id number] Yes Take 650 mg by mouth every 8 (eight) hours as needed for pain. [provider]  Active   amLODipine  (NORVASC ) 5 MG tablet [id number] Yes Take 1 tablet (5 mg total) by mouth daily. Lowne Chase, Yvonne R, DO  Active   apixaban  (ELIQUIS ) 2.5 MG TABS tablet [id number] Yes Take 1 tablet (2.5 mg total) by mouth 2 (two) times daily.   Active   ascorbic acid (VITAMIN C) 500 MG tablet [id number] Yes Take 500 mg by mouth daily. [provider]  Active Self, Pharmacy Records           Med Note [facility], [facility] B   Wed Jun 12, 2024  4:38 PM)    calcium carbonate (OS-CAL) 600 MG TABS [id number] Yes Take 600 mg by mouth daily. [provider]  Active Self, Pharmacy Records  Cholecalciferol (VITAMIN D  PO) [id number] Yes Take 1 capsule by mouth daily. 2000 units [provider]  Active Self, Pharmacy Records  Med Note JUSTINO, Adelfa Lozito B   Wed Jun 12, 2024  4:38 PM)    Co-Enzyme Q10 100 MG CAPS [id number] Yes Take 100 mg by mouth daily. [provider]  Active   glucosamine-chondroitin 500-400 MG tablet [id number] Yes Take 1 tablet by mouth daily.  [provider]  Active Self, Pharmacy Records  Latanoprostene Bunod [place] OP) [id number] Yes Apply 1 drop to eye once. One drop in each eye per day for glaucoma [provider]  Active Self  losartan  (COZAAR ) 50 MG tablet [id number] Yes Take 1 tablet (50 mg total) by mouth daily. Antonio Cyndee Jamee JONELLE, DO  Active   Magnesium 400 MG TABS [id number]  Take 400 mg by mouth daily.  Patient not taking: Reported on 12/19/2024   [provider]  Active   metoprolol  succinate (TOPROL -XL) 100 MG 24 hr tablet [id number] Yes Take 1 tablet (100 mg total) by mouth daily. Take with or  immediately following a meal. Antonio Cyndee, Yvonne R, DO  Active   Red Yeast Rice 600 MG TABS [id number] Yes Take 1 tablet by mouth daily. [provider]  Active Self, Pharmacy Records  timolol (TIMOPTIC) 0.5 % ophthalmic solution [id number] Yes 1 drop every morning. [provider]  Active Self, Pharmacy Records            Patient Active Problem List   Diagnosis Date Noted   Closed displaced intertrochanteric fracture of left femur (HCC) 01/17/2024   Fall 01/17/2024   Leukocytosis 01/17/2024   Change in bowel function 02/01/2022   Hyperlipidemia 04/14/2020   Seasonal allergies 11/27/2019   Persistent atrial fibrillation (HCC) 03/26/2019   Anticoagulated 03/26/2019   Loose stools 04/14/2015   Osteoarthritis 04/14/2015   Chronic flank pain 06/17/2013   Plantar fasciitis 05/25/2013   PAD (peripheral artery disease) 03/07/2013   Acute hip pain, left 02/05/2013   CARDIAC MURMUR 06/29/2010   VARICOSE VEINS, LOWER EXTREMITIES 06/02/2010   PLANTAR WART, LEFT 04/03/2009   Benign neoplasm of skin 04/03/2009   HIP PAIN, RIGHT 04/03/2009   Dyslipidemia, goal LDL below 70 06/29/2007   ARTIFICIAL MENOPAUSE 06/29/2007   Pain in limb 06/29/2007   Personal history presenting hazards to health 06/29/2007   Unspecified glaucoma 04/27/2007   Essential hypertension 04/27/2007   Osteoporosis 04/27/2007     Medication Assistance:  None required.  Patient affirms current coverage meets needs.   Assessment / Plan: Hypertension: blood pressure at home usually at goal  - Continue to take metoprolol  SUCCINATE ER 100mg  ONCE a day and amlodipine  5mg  daily  - Continue losartan  50mg  daily. Since she has only taken 3 doses will check back in about 2 weeks to see if blood pressure in improving.  - Patient to see cardiologist 12/31/2024 - Continue to check blood pressure 2 to 3 days per week. Reviewed blood pressure goals.   Follow up with PCP in 3 weeks.  I will check back with her  in 6 weeks.    Madelin Ray, PharmD Clinical Pharmacist [facility]  - [facility] [phone]  "

## 2024-12-31 ENCOUNTER — Ambulatory Visit: Admitting: Cardiovascular Disease

## 2025-01-21 ENCOUNTER — Ambulatory Visit: Admitting: Family Medicine

## 2025-02-05 ENCOUNTER — Ambulatory Visit: Admitting: Cardiology

## 2025-02-12 ENCOUNTER — Other Ambulatory Visit

## 2025-05-20 ENCOUNTER — Ambulatory Visit
